# Patient Record
Sex: Female | Born: 1980 | Race: White | Hispanic: No | Marital: Married | Smoking: Never smoker
Health system: Southern US, Community
[De-identification: ages and names within clinical notes are randomized; demographics above are authoritative.]

## PROBLEM LIST (undated history)

## (undated) DIAGNOSIS — K3184 Gastroparesis: Secondary | ICD-10-CM

## (undated) DIAGNOSIS — F419 Anxiety disorder, unspecified: Secondary | ICD-10-CM

## (undated) DIAGNOSIS — R109 Unspecified abdominal pain: Secondary | ICD-10-CM

## (undated) DIAGNOSIS — E119 Type 2 diabetes mellitus without complications: Secondary | ICD-10-CM

## (undated) DIAGNOSIS — G5602 Carpal tunnel syndrome, left upper limb: Secondary | ICD-10-CM

## (undated) DIAGNOSIS — R519 Headache, unspecified: Secondary | ICD-10-CM

## (undated) DIAGNOSIS — E039 Hypothyroidism, unspecified: Secondary | ICD-10-CM

## (undated) DIAGNOSIS — L02419 Cutaneous abscess of limb, unspecified: Secondary | ICD-10-CM

## (undated) DIAGNOSIS — K219 Gastro-esophageal reflux disease without esophagitis: Secondary | ICD-10-CM

## (undated) DIAGNOSIS — F329 Major depressive disorder, single episode, unspecified: Secondary | ICD-10-CM

## (undated) DIAGNOSIS — K8020 Calculus of gallbladder without cholecystitis without obstruction: Secondary | ICD-10-CM

## (undated) DIAGNOSIS — F32A Depression, unspecified: Secondary | ICD-10-CM

## (undated) DIAGNOSIS — G8929 Other chronic pain: Secondary | ICD-10-CM

## (undated) DIAGNOSIS — R51 Headache: Secondary | ICD-10-CM

## (undated) DIAGNOSIS — G4730 Sleep apnea, unspecified: Secondary | ICD-10-CM

## (undated) DIAGNOSIS — L03119 Cellulitis of unspecified part of limb: Secondary | ICD-10-CM

## (undated) DIAGNOSIS — K429 Umbilical hernia without obstruction or gangrene: Secondary | ICD-10-CM

## (undated) DIAGNOSIS — I890 Lymphedema, not elsewhere classified: Secondary | ICD-10-CM

## (undated) HISTORY — DX: Calculus of gallbladder without cholecystitis without obstruction: K80.20

## (undated) HISTORY — DX: Carpal tunnel syndrome, left upper limb: G56.02

## (undated) HISTORY — PX: HERNIA REPAIR: SHX51

## (undated) HISTORY — DX: Hypothyroidism, unspecified: E03.9

## (undated) HISTORY — DX: Anxiety disorder, unspecified: F41.9

---

## 2001-03-14 ENCOUNTER — Emergency Department (HOSPITAL_COMMUNITY): Admission: EM | Admit: 2001-03-14 | Discharge: 2001-03-14 | Payer: Self-pay | Admitting: Emergency Medicine

## 2005-02-25 ENCOUNTER — Ambulatory Visit (HOSPITAL_COMMUNITY): Admission: RE | Admit: 2005-02-25 | Discharge: 2005-02-25 | Payer: Self-pay | Admitting: Obstetrics and Gynecology

## 2010-08-01 ENCOUNTER — Encounter: Payer: Self-pay | Admitting: Obstetrics and Gynecology

## 2011-05-02 ENCOUNTER — Ambulatory Visit: Payer: Self-pay | Admitting: Family Medicine

## 2012-11-26 ENCOUNTER — Encounter (INDEPENDENT_AMBULATORY_CARE_PROVIDER_SITE_OTHER): Payer: Self-pay

## 2012-11-26 ENCOUNTER — Emergency Department (HOSPITAL_COMMUNITY): Payer: Medicaid Other

## 2012-11-26 ENCOUNTER — Encounter (HOSPITAL_COMMUNITY): Payer: Self-pay | Admitting: *Deleted

## 2012-11-26 ENCOUNTER — Emergency Department (HOSPITAL_COMMUNITY)
Admission: EM | Admit: 2012-11-26 | Discharge: 2012-11-26 | Disposition: A | Discharge status: Home / Self Care | Payer: Medicaid Other | Attending: Emergency Medicine | Admitting: Emergency Medicine

## 2012-11-26 DIAGNOSIS — R112 Nausea with vomiting, unspecified: Secondary | ICD-10-CM | POA: Insufficient documentation

## 2012-11-26 DIAGNOSIS — Z3202 Encounter for pregnancy test, result negative: Secondary | ICD-10-CM | POA: Insufficient documentation

## 2012-11-26 DIAGNOSIS — Z8679 Personal history of other diseases of the circulatory system: Secondary | ICD-10-CM | POA: Insufficient documentation

## 2012-11-26 DIAGNOSIS — E119 Type 2 diabetes mellitus without complications: Secondary | ICD-10-CM | POA: Insufficient documentation

## 2012-11-26 DIAGNOSIS — Z91040 Latex allergy status: Secondary | ICD-10-CM | POA: Insufficient documentation

## 2012-11-26 DIAGNOSIS — K429 Umbilical hernia without obstruction or gangrene: Secondary | ICD-10-CM | POA: Insufficient documentation

## 2012-11-26 HISTORY — DX: Lymphedema, not elsewhere classified: I89.0

## 2012-11-26 HISTORY — DX: Type 2 diabetes mellitus without complications: E11.9

## 2012-11-26 LAB — COMPREHENSIVE METABOLIC PANEL
ALT: 30 U/L (ref 0–35)
AST: 27 U/L (ref 0–37)
Albumin: 3.5 g/dL (ref 3.5–5.2)
CO2: 25 mEq/L (ref 19–32)
Creatinine, Ser: 0.79 mg/dL (ref 0.50–1.10)
GFR calc Af Amer: >90 mL/min (ref >90)
GFR calc non Af Amer: >90 mL/min (ref >90)
Glucose, Bld: 389 mg/dL — ABNORMAL HIGH (ref 70–99)
Potassium: 3.8 mEq/L (ref 3.5–5.1)
Total Protein: 7.5 g/dL (ref 6.0–8.3)

## 2012-11-26 LAB — URINALYSIS, ROUTINE W REFLEX MICROSCOPIC
Bilirubin Urine: NEGATIVE
Glucose, UA: >1000 mg/dL — AB
Ketones, ur: NEGATIVE mg/dL
Nitrite: NEGATIVE
Protein, ur: NEGATIVE mg/dL
Urobilinogen, UA: 0.2 mg/dL (ref 0.0–1.0)

## 2012-11-26 LAB — LIPASE, BLOOD: Lipase: 34 U/L (ref 11–59)

## 2012-11-26 LAB — CBC WITH DIFFERENTIAL/PLATELET
Basophils Relative: 0 % (ref 0–1)
Eosinophils Absolute: 0.1 10*3/uL (ref 0.0–0.7)
Hemoglobin: 13.2 g/dL (ref 12.0–15.0)
Lymphocytes Relative: 32 % (ref 12–46)
MCV: 89.2 fL (ref 78.0–100.0)
Monocytes Absolute: 0.3 10*3/uL (ref 0.1–1.0)
Monocytes Relative: 4 % (ref 3–12)
Neutro Abs: 4.3 10*3/uL (ref 1.7–7.7)
Neutrophils Relative %: 62 % (ref 43–77)
Platelets: 202 10*3/uL (ref 150–400)
RDW: 13.8 % (ref 11.5–15.5)

## 2012-11-26 LAB — URINE MICROSCOPIC-ADD ON

## 2012-11-26 LAB — LACTIC ACID, PLASMA: Lactic Acid, Venous: 2.8 mmol/L — ABNORMAL HIGH (ref 0.5–2.2)

## 2012-11-26 MED ORDER — HYDROMORPHONE HCL PF 1 MG/ML IJ SOLN
0.5000 mg | Freq: Once | INTRAMUSCULAR | Status: AC
  Administered 2012-11-26: 0.5 mg via INTRAVENOUS
  Filled 2012-11-26: qty 1

## 2012-11-26 MED ORDER — ONDANSETRON HCL 4 MG PO TABS: 4.0000 mg | ORAL_TABLET | Freq: Four times a day (QID) | ORAL | Status: DC | PRN

## 2012-11-26 MED ORDER — ONDANSETRON HCL 4 MG/2ML IJ SOLN
4.0000 mg | Freq: Once | INTRAMUSCULAR | Status: AC
  Administered 2012-11-26: 4 mg via INTRAVENOUS
  Filled 2012-11-26: qty 2

## 2012-11-26 MED ORDER — SODIUM CHLORIDE 0.9 % IV SOLN
Freq: Once | INTRAVENOUS | Status: AC
  Administered 2012-11-26: 20:00:00 via INTRAVENOUS

## 2012-11-26 MED ORDER — OXYCODONE-ACETAMINOPHEN 5-325 MG PO TABS: 1.0000 | ORAL_TABLET | ORAL | Status: DC | PRN

## 2012-11-26 NOTE — ED Notes (Signed)
abd pain, vomiting , Seen at [facility] recently, says she has umbilical hernia.

## 2012-11-26 NOTE — Consult Note (Signed)
Reason for Consult: Umbilical hernia Referring Physician: ER physician  Brittany [name] is an 32 y.o. female.  HPI: Patient is a morbidly obese white female who has had an umbilical hernia for many years. She states that she has had intermittent bleeding from the umbilicus and that it has been red and irritated for many years. She was recently seen by her primary care physician do to increasing swelling of the umbilicus. She has been referred to [facility] surgical for further management and treatment. She presented emergency room in [facility] today due to worsening pain inthe umbilicus. She states she had an episode of vomiting earlier today, though she has had this multiple times over the past few years. This episode was not any different. No fever or chills have been noted. She has had normal bowel movements.  Past Medical History  Diagnosis Date  . Diabetes mellitus without complication   . Lymphedema     Past Surgical History  Procedure Laterality Date  . Cesarean section      History reviewed. No pertinent family history.  Social History:  reports that she has never smoked. She does not have any smokeless tobacco history on file. She reports that she does not drink alcohol or use illicit drugs.  Allergies:  Allergies  Allergen Reactions  . Latex Itching, Swelling and Rash  . Sulfa Antibiotics Nausea And Vomiting and Rash    Medications: I have reviewed the patient's current medications.  Results for orders placed during the hospital encounter of 11/26/12 (from the past 48 hour(s))  CBC WITH DIFFERENTIAL     Status: None   Collection Time    11/26/12  7:50 PM      Result Value Range   WBC 6.9  4.0 - 10.5 K/uL   RBC 4.53  3.87 - 5.11 MIL/uL   Hemoglobin 13.2  12.0 - 15.0 g/dL   HCT 16.1  09.6 - 04.5 %   MCV 89.2  78.0 - 100.0 fL   MCH 29.1  26.0 - 34.0 pg   MCHC 32.7  30.0 - 36.0 g/dL   RDW 40.9  81.1 - 91.4 %   Platelets 202  150 - 400 K/uL   Neutrophils  Relative % 62  43 - 77 %   Neutro Abs 4.3  1.7 - 7.7 K/uL   Lymphocytes Relative 32  12 - 46 %   Lymphs Abs 2.2  0.7 - 4.0 K/uL   Monocytes Relative 4  3 - 12 %   Monocytes Absolute 0.3  0.1 - 1.0 K/uL   Eosinophils Relative 2  0 - 5 %   Eosinophils Absolute 0.1  0.0 - 0.7 K/uL   Basophils Relative 0  0 - 1 %   Basophils Absolute 0.0  0.0 - 0.1 K/uL  COMPREHENSIVE METABOLIC PANEL     Status: Abnormal   Collection Time    11/26/12  7:50 PM      Result Value Range   Sodium 132 (*) 135 - 145 mEq/L   Potassium 3.8  3.5 - 5.1 mEq/L   Chloride 95 (*) 96 - 112 mEq/L   CO2 25  19 - 32 mEq/L   Glucose, Bld 389 (*) 70 - 99 mg/dL   BUN 10  6 - 23 mg/dL   Creatinine, Ser 7.82  0.50 - 1.10 mg/dL   Calcium 9.3  8.4 - 95.6 mg/dL   Total Protein 7.5  6.0 - 8.3 g/dL   Albumin 3.5  3.5 - 5.2  g/dL   AST 27  0 - 37 U/L   ALT 30  0 - 35 U/L   Alkaline Phosphatase 121 (*) 39 - 117 U/L   Total Bilirubin 0.3  0.3 - 1.2 mg/dL   GFR calc non Af Amer >90  >90 mL/min   GFR calc Af Amer >90  >90 mL/min   Comment:            The eGFR has been calculated     using the CKD EPI equation.     This calculation has not been     validated in all clinical     situations.     eGFR's persistently     <90 mL/min signify     possible Chronic Kidney Disease.  LIPASE, BLOOD     Status: None   Collection Time    11/26/12  7:50 PM      Result Value Range   Lipase 34  11 - 59 U/L  LACTIC ACID, PLASMA     Status: Abnormal   Collection Time    11/26/12  8:17 PM      Result Value Range   Lactic Acid, Venous 2.8 (*) 0.5 - 2.2 mmol/L  URINALYSIS, ROUTINE W REFLEX MICROSCOPIC     Status: Abnormal   Collection Time    11/26/12  8:20 PM      Result Value Range   Color, Urine YELLOW  YELLOW   APPearance CLEAR  CLEAR   Specific Gravity, Urine >1.030 (*) 1.005 - 1.030   pH 5.5  5.0 - 8.0   Glucose, UA >1000 (*) NEGATIVE mg/dL   Hgb urine dipstick NEGATIVE  NEGATIVE   Bilirubin Urine NEGATIVE  NEGATIVE   Ketones,  ur NEGATIVE  NEGATIVE mg/dL   Protein, ur NEGATIVE  NEGATIVE mg/dL   Urobilinogen, UA 0.2  0.0 - 1.0 mg/dL   Nitrite NEGATIVE  NEGATIVE   Leukocytes, UA NEGATIVE  NEGATIVE  URINE MICROSCOPIC-ADD ON     Status: Abnormal   Collection Time    11/26/12  8:20 PM      Result Value Range   Squamous Epithelial / LPF FEW (*) RARE   WBC, UA 0-2  <3 WBC/hpf   Bacteria, UA RARE  RARE  POCT PREGNANCY, URINE     Status: None   Collection Time    11/26/12  8:27 PM      Result Value Range   Preg Test, Ur NEGATIVE  NEGATIVE   Comment:            THE SENSITIVITY OF THIS     METHODOLOGY IS >24 mIU/mL    Dg Abd Acute W/chest  11/26/2012   *RADIOLOGY REPORT*  Clinical Data: Abdominal pain, nausea and vomiting.  ACUTE ABDOMEN SERIES (ABDOMEN 2 VIEW & CHEST 1 VIEW)  Comparison: 11/18/2012.  Findings: The upright chest x-ray is unremarkable and stable.  Two views of the abdomen are limited by body habitus.  The bowel gas pattern is grossly normal.  No findings to suggest a small bowel obstruction or free air.  The soft tissue shadows are grossly maintained.  The bony structures are intact.  An IUD is noted in the pelvis.  IMPRESSION:  1.  No acute cardiopulmonary findings. 2.  Limited abdominal examination due to body habitus but no gross acute abdominal findings.   Original Report Authenticated By: Rudie Meyer, M.D.    ROS: See chart Blood pressure 138/94, pulse 106, temperature 97.6 F (36.4 C), temperature source Oral, resp.  rate 20, height 5\' 11"  (1.803 m), weight 255.375 kg (563 lb), SpO2 100.00%. Physical Exam: Morbidly obese white female in no acute distress. She is lying comfortably in the bed. Lungs clear to auscultation with equal breath sounds bilaterally. Heart examination reveals a regular rate and rhythm without S3, S4, murmurs. Abdomen is soft, nontender, nondistended. No rigidity noted. She does have an easily reducible umbilical hernia with the skin overlying the umbilicus with scarring and  redness noted. No purulent drainage is noted. No bleeding is noted.  Assessment/Plan: Impression: Umbilical hernia, chronic, not incarcerated or strangulated. Plan: No need for urgent surgical intervention. The patient wishes to keep her appointment with [facility] surgical next week. She has been on antibiotics and pass. I do not think she needs more dosing at this time. Neosporin ointment to the umbilicus as needed. This was discussed with Dr. Preston Fleeting.  Aviya Jarvie A 11/26/2012, 9:22 PM

## 2012-11-26 NOTE — ED Provider Notes (Signed)
History     CSN: [id number]  Arrival date & time 11/26/12  1907   First MD Initiated Contact with Patient 11/26/12 1930      Chief Complaint  Patient presents with  . Abdominal Pain    (Consider location/radiation/quality/duration/timing/severity/associated sxs/prior treatment) Patient is a 32 y.o. female presenting with abdominal pain. The history is provided by the patient.  Abdominal Pain She had onset one week ago of severe periumbilical pain with nausea and vomiting. She went to the emergency department at [facility] where workup showed that she had a local hernia. They were not able to do CT scan because of her body habitus and she was referred back to her PCP. She saw her PCP who has referred her to a Careers adviser in [place]. However, she continues to have pain and nausea which is worse after eating. She also has been having frequent vomiting. There's been no fever, chills, sweats. Pain is rated at 10/10. It is worse with palpation but nothing makes it better.  Past Medical History  Diagnosis Date  . Diabetes mellitus without complication   . Lymphedema     Past Surgical History  Procedure Laterality Date  . Cesarean section      History reviewed. No pertinent family history.  History  Substance Use Topics  . Smoking status: Never Smoker   . Smokeless tobacco: Not on file  . Alcohol Use: No    OB History   Grav Para Term Preterm Abortions TAB SAB Ect Mult Living                  Review of Systems  Gastrointestinal: Positive for abdominal pain.  All other systems reviewed and are negative.    Allergies  Latex and Sulfa antibiotics  Home Medications   Current Outpatient Rx  Name  Route  Sig  Dispense  Refill  . HYDROcodone-acetaminophen (NORCO/VICODIN) 5-325 MG per tablet   Oral   Take 1 tablet by mouth every 6 (six) hours as needed for pain.           BP 138/94  Pulse 106  Temp(Src) 97.6 F (36.4 C) (Oral)  Resp 20  Ht 5\' 11"  (1.803 m)   Wt 563 lb (255.375 kg)  BMI 78.56 kg/m2  SpO2 100%  Physical Exam  Nursing note and vitals reviewed.  Morbidly obese 32 year old female, resting comfortably and in no acute distress. Vital signs are significant for tachycardia very 106, and hypertension with blood pressure 138/94. Oxygen saturation is 100%, which is normal. Head is normocephalic and atraumatic. PERRLA, EOMI. Oropharynx is clear. Neck is nontender and supple without adenopathy or JVD. Back is nontender and there is no CVA tenderness. Lungs are clear without rales, wheezes, or rhonchi. Chest is nontender. Heart has regular rate and rhythm without murmur. Abdomen is soft, flat, with approximately 4 cm diameter umbilical hernia present. The hernia is very tender but is not indurated. It is able to be completely reduced, although it does re-form. There is no rebound or guarding. There are no masses or hepatosplenomegaly and peristalsis is hypoactive. Extremities have no cyanosis or edema, full range of motion is present. Skin is warm and dry without rash. Neurologic: Mental status is normal, cranial nerves are intact, there are no motor or sensory deficits.  ED Course  Procedures (including critical care time)  Results for orders placed during the hospital encounter of 11/26/12  CBC WITH DIFFERENTIAL      Result Value Range   WBC  6.9  4.0 - 10.5 K/uL   RBC 4.53  3.87 - 5.11 MIL/uL   Hemoglobin 13.2  12.0 - 15.0 g/dL   HCT 16.1  09.6 - 04.5 %   MCV 89.2  78.0 - 100.0 fL   MCH 29.1  26.0 - 34.0 pg   MCHC 32.7  30.0 - 36.0 g/dL   RDW 40.9  81.1 - 91.4 %   Platelets 202  150 - 400 K/uL   Neutrophils Relative % 62  43 - 77 %   Neutro Abs 4.3  1.7 - 7.7 K/uL   Lymphocytes Relative 32  12 - 46 %   Lymphs Abs 2.2  0.7 - 4.0 K/uL   Monocytes Relative 4  3 - 12 %   Monocytes Absolute 0.3  0.1 - 1.0 K/uL   Eosinophils Relative 2  0 - 5 %   Eosinophils Absolute 0.1  0.0 - 0.7 K/uL   Basophils Relative 0  0 - 1 %   Basophils  Absolute 0.0  0.0 - 0.1 K/uL  COMPREHENSIVE METABOLIC PANEL      Result Value Range   Sodium 132 (*) 135 - 145 mEq/L   Potassium 3.8  3.5 - 5.1 mEq/L   Chloride 95 (*) 96 - 112 mEq/L   CO2 25  19 - 32 mEq/L   Glucose, Bld 389 (*) 70 - 99 mg/dL   BUN 10  6 - 23 mg/dL   Creatinine, Ser 7.82  0.50 - 1.10 mg/dL   Calcium 9.3  8.4 - 95.6 mg/dL   Total Protein 7.5  6.0 - 8.3 g/dL   Albumin 3.5  3.5 - 5.2 g/dL   AST 27  0 - 37 U/L   ALT 30  0 - 35 U/L   Alkaline Phosphatase 121 (*) 39 - 117 U/L   Total Bilirubin 0.3  0.3 - 1.2 mg/dL   GFR calc non Af Amer >90  >90 mL/min   GFR calc Af Amer >90  >90 mL/min  LIPASE, BLOOD      Result Value Range   Lipase 34  11 - 59 U/L  URINALYSIS, ROUTINE W REFLEX MICROSCOPIC      Result Value Range   Color, Urine YELLOW  YELLOW   APPearance CLEAR  CLEAR   Specific Gravity, Urine >1.030 (*) 1.005 - 1.030   pH 5.5  5.0 - 8.0   Glucose, UA >1000 (*) NEGATIVE mg/dL   Hgb urine dipstick NEGATIVE  NEGATIVE   Bilirubin Urine NEGATIVE  NEGATIVE   Ketones, ur NEGATIVE  NEGATIVE mg/dL   Protein, ur NEGATIVE  NEGATIVE mg/dL   Urobilinogen, UA 0.2  0.0 - 1.0 mg/dL   Nitrite NEGATIVE  NEGATIVE   Leukocytes, UA NEGATIVE  NEGATIVE  LACTIC ACID, PLASMA      Result Value Range   Lactic Acid, Venous 2.8 (*) 0.5 - 2.2 mmol/L  URINE MICROSCOPIC-ADD ON      Result Value Range   Squamous Epithelial / LPF FEW (*) RARE   WBC, UA 0-2  <3 WBC/hpf   Bacteria, UA RARE  RARE  POCT PREGNANCY, URINE      Result Value Range   Preg Test, Ur NEGATIVE  NEGATIVE   Dg Abd Acute W/chest  11/26/2012   *RADIOLOGY REPORT*  Clinical Data: Abdominal pain, nausea and vomiting.  ACUTE ABDOMEN SERIES (ABDOMEN 2 VIEW & CHEST 1 VIEW)  Comparison: 11/18/2012.  Findings: The upright chest x-ray is unremarkable and stable.  Two views of the abdomen  are limited by body habitus.  The bowel gas pattern is grossly normal.  No findings to suggest a small bowel obstruction or free air.  The soft  tissue shadows are grossly maintained.  The bony structures are intact.  An IUD is noted in the pelvis.  IMPRESSION:  1.  No acute cardiopulmonary findings. 2.  Limited abdominal examination due to body habitus but no gross acute abdominal findings.   Original Report Authenticated By: Rudie Meyer, M.D.    Images viewed by me.   1. Umbilical hernia   2. Morbid obesity       MDM  Umbilical hernia without evidence of incarceration. Statistically, the hernia is most likely to contain omentum and not bowel. Because of her weight of well over 500 pounds, she cannot be accommodated by our CT scanner. Plain x-rays will be obtained to look for evidence of obstruction and lactic acid level will be checked which is also looking for evidence of incarceration. She will be given hydromorphone for pain and ondansetron for nausea. She states that she did not like the way that morphine made her feel, so she will be started on a low dose of hydromorphone and titrated up until she gets adequate pain relief.  She got good relief of pain with hydromorphone 0.5 mg. Workup is significant for a mildly elevated lactic acid level. WBC is normal and she does not have clinical signs of an acute abdomen. Surgical consultation will be obtained. I have contacted Dr. Lovell Sheehan who agrees to see the patient in the ED.  Dr. Lovell Sheehan consult is appreciated. He also believes that the patient is safe to go home. There is no evidence of incarcerated hernia or strangulated bowel. She is discharged with prescriptions for Percocet and ondansetron and is to see her surgeon in [place] as scheduled.      Dione Booze, MD 11/26/12 2135

## 2012-12-06 ENCOUNTER — Encounter (INDEPENDENT_AMBULATORY_CARE_PROVIDER_SITE_OTHER): Payer: Self-pay | Admitting: General Surgery

## 2012-12-06 ENCOUNTER — Ambulatory Visit (INDEPENDENT_AMBULATORY_CARE_PROVIDER_SITE_OTHER): Payer: Medicaid Other | Admitting: General Surgery

## 2012-12-06 VITALS — BP 108/82 | HR 87 | Temp 97.3°F | Ht 71.0 in | Wt >= 6400 oz

## 2012-12-06 DIAGNOSIS — K429 Umbilical hernia without obstruction or gangrene: Secondary | ICD-10-CM

## 2012-12-06 NOTE — Progress Notes (Signed)
Patient ID: Brittany [name], female   DOB: Mar 20, 1981, 32 y.o.   MRN: [id number]  Chief Complaint  Patient presents with  . New Evaluation    eval umb hernia    HPI Brittany [name] is a 32 y.o. female.  This patient is referred by Dr. Wilford Sports for evaluation of an umbilical hernia. She has known about this for the last 3-4 years and has been increasing in size until about the last year which he says it has been stable in size. However, recently it is becoming more uncomfortable and she actually had a some nausea and some bleeding from around the umbilicus and she presented to [facility].  She again presented to the emergency room this time at [facility] where she was seen in the emergency room againwith periumbilical pain. She was evaluated by the surgeon to reduce the hernia but she said the hernia came right back. She says that she's had blood from around the umbilicus about 3 times as well as some blood in in her stool and some vomiting. She's describes this as "sore" but essentially her symptoms have been stable. She says that the nausea and vomiting is independent of her hernias and she has had this for some time as well. She has not had any CT scan because of her weight and the inability to fit on the scanner.  She is diabetic and she says that her sugars run at 200-230 range  HPI  Past Medical History  Diagnosis Date  . Diabetes mellitus without complication   . Lymphedema     Past Surgical History  Procedure Laterality Date  . Cesarean section  11/09/01    Family History  Problem Relation Age of Onset  . Cancer Mother     leukemia    Social History History  Substance Use Topics  . Smoking status: Never Smoker   . Smokeless tobacco: Not on file  . Alcohol Use: No    Allergies  Allergen Reactions  . Latex Itching, Swelling and Rash  . Sulfa Antibiotics Nausea And Vomiting and Rash    Current Outpatient Prescriptions  Medication Sig Dispense Refill  .  metFORMIN (GLUCOPHAGE) 500 MG tablet Take 500 mg by mouth daily.      . ondansetron (ZOFRAN) 4 MG tablet Take 1 tablet (4 mg total) by mouth every 6 (six) hours as needed for nausea.  20 tablet  0   No current facility-administered medications for this visit.    Review of Systems Review of Systems All other review of systems negative or noncontributory except as stated in the HPI  Blood pressure 108/82, pulse 87, temperature 97.3 F (36.3 C), temperature source Temporal, height 5\' 11"  (1.803 m), weight 565 lb 3.2 oz (256.373 kg), SpO2 98.00%.  Physical Exam Physical Exam Physical Exam  Nursing note and vitals reviewed. Constitutional: She is oriented to person, place, and time. She appears well-developed and well-nourished. No distress.  HENT:  Head: Normocephalic and atraumatic.  Mouth/Throat: No oropharyngeal exudate.  Eyes: Conjunctivae and EOM are normal. Pupils are equal, round, and reactive to light. Right eye exhibits no discharge. Left eye exhibits no discharge. No scleral icterus.  Neck: Normal range of motion. Neck supple. No tracheal deviation present.  Cardiovascular: Normal rate, regular rhythm, normal heart sounds and intact distal pulses.   Pulmonary/Chest: Effort normal and breath sounds normal. No stridor. No respiratory distress. She has no wheezes.  Abdominal: Soft. Bowel sounds are normal. She exhibits no distension and  no mass. There is no tenderness. There is no rebound and no guarding. She has a moderate sized umbilical hernia with some slight thickening of the skin and excoriation of the periumbilical skin where the hernia is chaffing on the adjacent skin.  No bleeding and no cellulitis.  Minimal tenderness.  Musculoskeletal: Normal range of motion. She has significant edema and LE swelling and no tenderness.  Neurological: She is alert and oriented to person, place, and time.  Skin: Skin is warm and dry. periumbilical rash noted. She is not diaphoretic. No  erythema. No pallor.  Psychiatric: She has a normal mood and affect. Her behavior is normal. Judgment and thought content normal.    Data Reviewed ER records and labs  Assessment    Umbilical hernia She does have a moderate-sized umbilical hernia. She was like to have this repaired because she is apparently symptomatic from this. It has been enlarging in size and I think that she would benefit from repair. Generally this would be amenable to open repair but given her weight and the excoriation of the skin, I think that laparoscopic repair might be more beneficial for her. I think that she would be low risk of wound infection moving the incisions away from the actual hernia.  Regardless, given her diabetes and her weight, I explained that she is high risk for complications. I would recommend laparoscopic umbilical hernia repair with mesh. We discussed the procedure and its risks including the risks of infection, bleeding, pain, scarring, recurrence, persistent symptoms, seroma and persistent umbilical bulge and poor cosmesis, bowel injury and the need for open surgery and she expressed understanding of them would like to proceed with laparoscopic umbilical hernia repair with mesh. I think that she would benefit from tighter glucose control in the meantime however, given her symptoms and the skin excoriation, not sure that we have time to fully adjust her diabetes regimen prior to surgery. I will plan for overnight observation and glucose control and pain control after her procedure     Plan    We will plan for laparoscopic umbilical hernia repair with mesh when convenient for her        Lodema Pilot DAVID 12/06/2012, 2:53 PM

## 2012-12-10 MED FILL — Oxycodone w/ Acetaminophen Tab 5-325 MG: ORAL | Qty: 6 | Status: AC

## 2012-12-13 ENCOUNTER — Telehealth (INDEPENDENT_AMBULATORY_CARE_PROVIDER_SITE_OTHER): Payer: Self-pay

## 2012-12-13 NOTE — Telephone Encounter (Signed)
Pts spouse called stating pt having severe abd pain with vomiting. He want to know if he should take pt to ER. I advised him pt needs to be evaluated By ER MD to rule out obstruction or incarcerated hernia. He states he understands and will take pt  to [facility] now.

## 2012-12-21 ENCOUNTER — Encounter (HOSPITAL_COMMUNITY): Payer: Self-pay | Admitting: Pharmacy Technician

## 2012-12-28 ENCOUNTER — Encounter (HOSPITAL_COMMUNITY)
Admission: RE | Admit: 2012-12-28 | Discharge: 2012-12-28 | Disposition: A | Discharge status: Home / Self Care | Payer: Medicaid Other | Source: Ambulatory Visit | Attending: General Surgery | Admitting: General Surgery

## 2012-12-28 ENCOUNTER — Other Ambulatory Visit (HOSPITAL_COMMUNITY): Payer: Self-pay | Admitting: *Deleted

## 2012-12-28 ENCOUNTER — Encounter (HOSPITAL_COMMUNITY): Payer: Self-pay

## 2012-12-28 VITALS — BP 135/86 | HR 84 | Temp 97.7°F | Resp 16 | Ht 71.0 in | Wt >= 6400 oz

## 2012-12-28 DIAGNOSIS — K429 Umbilical hernia without obstruction or gangrene: Secondary | ICD-10-CM

## 2012-12-28 HISTORY — DX: Umbilical hernia without obstruction or gangrene: K42.9

## 2012-12-28 HISTORY — DX: Sleep apnea, unspecified: G47.30

## 2012-12-28 LAB — SURGICAL PCR SCREEN
MRSA, PCR: NEGATIVE
Staphylococcus aureus: POSITIVE — AB

## 2012-12-28 LAB — BASIC METABOLIC PANEL
Calcium: 9.1 mg/dL (ref 8.4–10.5)
Creatinine, Ser: 0.63 mg/dL (ref 0.50–1.10)
GFR calc non Af Amer: >90 mL/min (ref >90)
Glucose, Bld: 346 mg/dL — ABNORMAL HIGH (ref 70–99)
Sodium: 135 mEq/L (ref 135–145)

## 2012-12-28 LAB — CBC WITH DIFFERENTIAL/PLATELET
Basophils Absolute: 0 10*3/uL (ref 0.0–0.1)
Basophils Relative: 0 % (ref 0–1)
Eosinophils Absolute: 0.1 10*3/uL (ref 0.0–0.7)
Lymphs Abs: 1.4 10*3/uL (ref 0.7–4.0)
MCH: 28.8 pg (ref 26.0–34.0)
Neutrophils Relative %: 64 % (ref 43–77)
Platelets: 183 10*3/uL (ref 150–400)
RBC: 4.55 MIL/uL (ref 3.87–5.11)
RDW: 13.9 % (ref 11.5–15.5)

## 2012-12-28 LAB — HCG, SERUM, QUALITATIVE: Preg, Serum: NEGATIVE

## 2012-12-28 NOTE — Progress Notes (Signed)
BMET faxed to Dr. Biagio Quint

## 2012-12-28 NOTE — Patient Instructions (Addendum)
GRIZEL VESELY  12/28/2012                           YOUR PROCEDURE IS SCHEDULED ON:  01/02/13               PLEASE REPORT TO SHORT STAY CENTER AT :  9:30 AM               CALL THIS NUMBER IF ANY PROBLEMS THE DAY OF SURGERY :               [phone]                      REMEMBER:   Do not eat food or drink liquids AFTER MIDNIGHT   Take these medicines the morning of surgery with A SIP OF WATER:  ZOFRAN IF NEEDED   Do not wear jewelry, make-up   Do not wear lotions, powders, or perfumes.   Do not shave legs or underarms 12 hrs. before surgery (men may shave face)  Do not bring valuables to the hospital.  Contacts, dentures or bridgework may not be worn into surgery.  Leave suitcase in the car. After surgery it may be brought to your room.  For patients admitted to the hospital more than one night, checkout time is 11:00                          The day of discharge.   Patients discharged the day of surgery will not be allowed to drive home                             If going home same day of surgery, must have someone stay with you first                           24 hrs at home and arrange for some one to drive you home from hospital.    Special Instructions:   Please read over the following fact sheets that you were given:               1. MRSA  INFORMATION                      2. [facility] PREPARING FOR SURGERY SHEET                                                X_____________________________________________________________________        Failure to follow these instructions may result in cancellation of your surgery

## 2013-01-02 ENCOUNTER — Encounter (HOSPITAL_COMMUNITY): Payer: Self-pay | Admitting: Certified Registered Nurse Anesthetist

## 2013-01-02 ENCOUNTER — Inpatient Hospital Stay (HOSPITAL_COMMUNITY)
Admission: RE | Admit: 2013-01-02 | Discharge: 2013-01-05 | DRG: 354 | Disposition: A | Discharge status: Home / Self Care | Payer: Medicaid Other | Source: Ambulatory Visit | Attending: General Surgery | Admitting: General Surgery

## 2013-01-02 ENCOUNTER — Encounter (HOSPITAL_COMMUNITY)
Admission: RE | Disposition: A | Discharge status: Home / Self Care | Payer: Self-pay | Source: Ambulatory Visit | Attending: General Surgery

## 2013-01-02 ENCOUNTER — Encounter (HOSPITAL_COMMUNITY): Payer: Self-pay | Admitting: *Deleted

## 2013-01-02 ENCOUNTER — Ambulatory Visit (HOSPITAL_COMMUNITY): Payer: Medicaid Other | Admitting: Certified Registered Nurse Anesthetist

## 2013-01-02 DIAGNOSIS — E119 Type 2 diabetes mellitus without complications: Secondary | ICD-10-CM

## 2013-01-02 DIAGNOSIS — K439 Ventral hernia without obstruction or gangrene: Secondary | ICD-10-CM

## 2013-01-02 DIAGNOSIS — IMO0001 Reserved for inherently not codable concepts without codable children: Secondary | ICD-10-CM

## 2013-01-02 DIAGNOSIS — K429 Umbilical hernia without obstruction or gangrene: Principal | ICD-10-CM

## 2013-01-02 DIAGNOSIS — G4733 Obstructive sleep apnea (adult) (pediatric): Secondary | ICD-10-CM | POA: Diagnosis present

## 2013-01-02 DIAGNOSIS — Z6841 Body Mass Index (BMI) 40.0 and over, adult: Secondary | ICD-10-CM

## 2013-01-02 HISTORY — PX: UMBILICAL HERNIA REPAIR: SHX196

## 2013-01-02 HISTORY — PX: INSERTION OF MESH: SHX5868

## 2013-01-02 LAB — GLUCOSE, CAPILLARY
Glucose-Capillary: 165 mg/dL — ABNORMAL HIGH (ref 70–99)
Glucose-Capillary: 234 mg/dL — ABNORMAL HIGH (ref 70–99)

## 2013-01-02 SURGERY — REPAIR, HERNIA, UMBILICAL, LAPAROSCOPIC
Anesthesia: General | Site: Abdomen | Wound class: Clean

## 2013-01-02 MED ORDER — ESMOLOL HCL 10 MG/ML IV SOLN
INTRAVENOUS | Status: DC | PRN
  Administered 2013-01-02 (×2): 20 mg via INTRAVENOUS
  Administered 2013-01-02: 10 mg via INTRAVENOUS

## 2013-01-02 MED ORDER — HYDROMORPHONE HCL PF 1 MG/ML IJ SOLN: 0.2500 mg | INTRAMUSCULAR | Status: DC | PRN

## 2013-01-02 MED ORDER — INSULIN ASPART 100 UNIT/ML ~~LOC~~ SOLN
0.0000 [IU] | SUBCUTANEOUS | Status: DC
  Administered 2013-01-02: 8 [IU] via SUBCUTANEOUS
  Administered 2013-01-02: 3 [IU] via SUBCUTANEOUS
  Administered 2013-01-02 (×2): 5 [IU] via SUBCUTANEOUS
  Administered 2013-01-03: 3 [IU] via SUBCUTANEOUS
  Administered 2013-01-03 (×3): 5 [IU] via SUBCUTANEOUS
  Administered 2013-01-03 – 2013-01-04 (×3): 3 [IU] via SUBCUTANEOUS
  Administered 2013-01-04: 5 [IU] via SUBCUTANEOUS
  Administered 2013-01-04 (×2): 3 [IU] via SUBCUTANEOUS
  Administered 2013-01-05: 2 [IU] via SUBCUTANEOUS
  Administered 2013-01-05 (×3): 3 [IU] via SUBCUTANEOUS
  Filled 2013-01-02: qty 1

## 2013-01-02 MED ORDER — MORPHINE SULFATE 2 MG/ML IJ SOLN
1.0000 mg | INTRAMUSCULAR | Status: DC | PRN
  Administered 2013-01-02 – 2013-01-03 (×4): 2 mg via INTRAVENOUS
  Administered 2013-01-03: 4 mg via INTRAVENOUS
  Administered 2013-01-04: 2 mg via INTRAVENOUS
  Filled 2013-01-02 (×2): qty 1
  Filled 2013-01-02: qty 2
  Filled 2013-01-02 (×3): qty 1

## 2013-01-02 MED ORDER — PROPOFOL 10 MG/ML IV BOLUS
INTRAVENOUS | Status: DC | PRN
  Administered 2013-01-02: 300 mg via INTRAVENOUS

## 2013-01-02 MED ORDER — LIDOCAINE-EPINEPHRINE 1 %-1:100000 IJ SOLN
INTRAMUSCULAR | Status: AC
  Filled 2013-01-02: qty 1

## 2013-01-02 MED ORDER — CEFAZOLIN SODIUM-DEXTROSE 2-3 GM-% IV SOLR
INTRAVENOUS | Status: AC
  Filled 2013-01-02: qty 50

## 2013-01-02 MED ORDER — CEFAZOLIN SODIUM 1-5 GM-% IV SOLN
INTRAVENOUS | Status: AC
  Filled 2013-01-02: qty 50

## 2013-01-02 MED ORDER — POTASSIUM CHLORIDE IN NACL 20-0.9 MEQ/L-% IV SOLN
INTRAVENOUS | Status: DC
  Administered 2013-01-02 – 2013-01-04 (×6): via INTRAVENOUS
  Filled 2013-01-02 (×8): qty 1000

## 2013-01-02 MED ORDER — ONDANSETRON HCL 4 MG PO TABS: 4.0000 mg | ORAL_TABLET | Freq: Four times a day (QID) | ORAL | Status: DC | PRN

## 2013-01-02 MED ORDER — BUPIVACAINE-EPINEPHRINE 0.25% -1:200000 IJ SOLN
INTRAMUSCULAR | Status: DC | PRN
  Administered 2013-01-02: 16 mL

## 2013-01-02 MED ORDER — PROMETHAZINE HCL 25 MG/ML IJ SOLN
INTRAMUSCULAR | Status: AC
  Filled 2013-01-02: qty 1

## 2013-01-02 MED ORDER — KETAMINE HCL 10 MG/ML IJ SOLN
INTRAMUSCULAR | Status: DC | PRN
  Administered 2013-01-02: 20 mg via INTRAVENOUS

## 2013-01-02 MED ORDER — METFORMIN HCL 500 MG PO TABS
500.0000 mg | ORAL_TABLET | Freq: Every day | ORAL | Status: DC
  Administered 2013-01-03 – 2013-01-04 (×2): 500 mg via ORAL
  Filled 2013-01-02 (×3): qty 1

## 2013-01-02 MED ORDER — OXYCODONE-ACETAMINOPHEN 5-325 MG PO TABS
1.0000 | ORAL_TABLET | ORAL | Status: DC | PRN
  Administered 2013-01-04 (×2): 1 via ORAL
  Administered 2013-01-04 – 2013-01-05 (×2): 2 via ORAL
  Administered 2013-01-05: 1 via ORAL
  Administered 2013-01-05: 2 via ORAL
  Filled 2013-01-02: qty 2
  Filled 2013-01-02: qty 1
  Filled 2013-01-02: qty 2
  Filled 2013-01-02: qty 1
  Filled 2013-01-02 (×2): qty 2

## 2013-01-02 MED ORDER — ONDANSETRON HCL 4 MG/2ML IJ SOLN
4.0000 mg | Freq: Four times a day (QID) | INTRAMUSCULAR | Status: DC | PRN
  Administered 2013-01-03 – 2013-01-05 (×4): 4 mg via INTRAVENOUS
  Filled 2013-01-02 (×5): qty 2

## 2013-01-02 MED ORDER — MIDAZOLAM HCL 5 MG/5ML IJ SOLN
INTRAMUSCULAR | Status: DC | PRN
  Administered 2013-01-02: 2 mg via INTRAVENOUS

## 2013-01-02 MED ORDER — BUPIVACAINE HCL (PF) 0.25 % IJ SOLN
INTRAMUSCULAR | Status: AC
  Filled 2013-01-02: qty 30

## 2013-01-02 MED ORDER — HEPARIN SODIUM (PORCINE) 5000 UNIT/ML IJ SOLN
5000.0000 [IU] | Freq: Three times a day (TID) | INTRAMUSCULAR | Status: DC
  Administered 2013-01-03 – 2013-01-04 (×5): 5000 [IU] via SUBCUTANEOUS
  Filled 2013-01-02 (×7): qty 1

## 2013-01-02 MED ORDER — HYDROMORPHONE HCL PF 1 MG/ML IJ SOLN
INTRAMUSCULAR | Status: DC | PRN
  Administered 2013-01-02 (×4): 0.5 mg via INTRAVENOUS

## 2013-01-02 MED ORDER — CEFAZOLIN SODIUM 10 G IJ SOLR
3.0000 g | INTRAMUSCULAR | Status: AC
  Administered 2013-01-02: 3 g via INTRAVENOUS
  Filled 2013-01-02: qty 3000

## 2013-01-02 MED ORDER — NEOSTIGMINE METHYLSULFATE 1 MG/ML IJ SOLN
INTRAMUSCULAR | Status: DC | PRN
  Administered 2013-01-02: 5 mg via INTRAVENOUS

## 2013-01-02 MED ORDER — FENTANYL CITRATE 0.05 MG/ML IJ SOLN
INTRAMUSCULAR | Status: DC | PRN
  Administered 2013-01-02 (×2): 100 ug via INTRAVENOUS
  Administered 2013-01-02 (×3): 50 ug via INTRAVENOUS

## 2013-01-02 MED ORDER — PROMETHAZINE HCL 25 MG/ML IJ SOLN
6.2500 mg | INTRAMUSCULAR | Status: DC | PRN
  Administered 2013-01-02: 6.25 mg via INTRAVENOUS

## 2013-01-02 MED ORDER — HEPARIN SODIUM (PORCINE) 5000 UNIT/ML IJ SOLN
5000.0000 [IU] | Freq: Once | INTRAMUSCULAR | Status: AC
  Administered 2013-01-02: 5000 [IU] via SUBCUTANEOUS
  Filled 2013-01-02: qty 1

## 2013-01-02 MED ORDER — LIDOCAINE-EPINEPHRINE 1 %-1:100000 IJ SOLN
INTRAMUSCULAR | Status: DC | PRN
  Administered 2013-01-02: 16 mL

## 2013-01-02 MED ORDER — INSULIN ASPART 100 UNIT/ML ~~LOC~~ SOLN: 0.0000 [IU] | SUBCUTANEOUS | Status: DC

## 2013-01-02 MED ORDER — LABETALOL HCL 5 MG/ML IV SOLN
INTRAVENOUS | Status: DC | PRN
  Administered 2013-01-02 (×4): 5 mg via INTRAVENOUS

## 2013-01-02 MED ORDER — ROCURONIUM BROMIDE 100 MG/10ML IV SOLN
INTRAVENOUS | Status: DC | PRN
  Administered 2013-01-02: 50 mg via INTRAVENOUS
  Administered 2013-01-02: 20 mg via INTRAVENOUS
  Administered 2013-01-02: 10 mg via INTRAVENOUS

## 2013-01-02 MED ORDER — GLYCOPYRROLATE 0.2 MG/ML IJ SOLN
INTRAMUSCULAR | Status: DC | PRN
  Administered 2013-01-02: 0.6 mg via INTRAVENOUS

## 2013-01-02 MED ORDER — LACTATED RINGERS IV SOLN
INTRAVENOUS | Status: DC
  Administered 2013-01-02: 16:00:00 via INTRAVENOUS

## 2013-01-02 MED ORDER — LACTATED RINGERS IV SOLN
INTRAVENOUS | Status: DC
  Administered 2013-01-02: 1000 mL via INTRAVENOUS

## 2013-01-02 MED ORDER — LIDOCAINE HCL (CARDIAC) 20 MG/ML IV SOLN
INTRAVENOUS | Status: DC | PRN
  Administered 2013-01-02: 100 mg via INTRAVENOUS

## 2013-01-02 MED ORDER — SUCCINYLCHOLINE CHLORIDE 20 MG/ML IJ SOLN
INTRAMUSCULAR | Status: DC | PRN
  Administered 2013-01-02: 160 mg via INTRAVENOUS

## 2013-01-02 MED ORDER — ONDANSETRON HCL 4 MG/2ML IJ SOLN
INTRAMUSCULAR | Status: DC | PRN
  Administered 2013-01-02: 4 mg via INTRAVENOUS

## 2013-01-02 SURGICAL SUPPLY — 49 items
ADH SKN CLS APL DERMABOND .7 (GAUZE/BANDAGES/DRESSINGS) ×2
BLADE SURG SZ11 CARB STEEL (BLADE) ×3 IMPLANT
CABLE HIGH FREQUENCY MONO STRZ (ELECTRODE) ×2 IMPLANT
CANISTER SUCTION 2500CC (MISCELLANEOUS) ×3 IMPLANT
CATH FOLEY LATEX FREE 16FR (CATHETERS) ×1 IMPLANT
CLOTH BEACON ORANGE TIMEOUT ST (SAFETY) ×3 IMPLANT
COTTON STERILE ROLL (GAUZE/BANDAGES/DRESSINGS) ×1 IMPLANT
DECANTER SPIKE VIAL GLASS SM (MISCELLANEOUS) ×3 IMPLANT
DERMABOND ADVANCED (GAUZE/BANDAGES/DRESSINGS) ×1
DERMABOND ADVANCED .7 DNX12 (GAUZE/BANDAGES/DRESSINGS) ×2 IMPLANT
DEVICE TROCAR PUNCTURE CLOSURE (ENDOMECHANICALS) ×1 IMPLANT
DRAPE INCISE IOBAN 66X45 STRL (DRAPES) ×4 IMPLANT
DRAPE INCISE IOBAN 85X60 (DRAPES) ×1 IMPLANT
DRAPE LAPAROSCOPIC ABDOMINAL (DRAPES) ×3 IMPLANT
DRSG TEGADERM 4X4.75 (GAUZE/BANDAGES/DRESSINGS) ×1 IMPLANT
ELECT REM PT RETURN 9FT ADLT (ELECTROSURGICAL) ×3
ELECTRODE REM PT RTRN 9FT ADLT (ELECTROSURGICAL) ×2 IMPLANT
GLOVE BIO SURGEON STRL SZ7 (GLOVE) ×4 IMPLANT
GLOVE BIOGEL PI IND STRL 7.0 (GLOVE) ×2 IMPLANT
GLOVE BIOGEL PI INDICATOR 7.0 (GLOVE)
GLOVE ECLIPSE 8.0 STRL XLNG CF (GLOVE) ×2 IMPLANT
GLOVE SURG SS PI 7.5 STRL IVOR (GLOVE) ×9 IMPLANT
GOWN PREVENTION PLUS LG XLONG (DISPOSABLE) ×6 IMPLANT
GOWN STRL NON-REIN LRG LVL3 (GOWN DISPOSABLE) ×3 IMPLANT
GOWN STRL REIN XL XLG (GOWN DISPOSABLE) ×6 IMPLANT
KIT BASIN OR (CUSTOM PROCEDURE TRAY) ×3 IMPLANT
MARKER SKIN DUAL TIP RULER LAB (MISCELLANEOUS) ×3 IMPLANT
MESH PARIETEX 20X15 (Mesh General) ×1 IMPLANT
NS IRRIG 1000ML POUR BTL (IV SOLUTION) ×3 IMPLANT
PACK UNIVERSAL I (CUSTOM PROCEDURE TRAY) ×1 IMPLANT
SCISSORS LAP 5X35 DISP (ENDOMECHANICALS) ×3 IMPLANT
SET IRRIG TUBING LAPAROSCOPIC (IRRIGATION / IRRIGATOR) ×1 IMPLANT
SOLUTION ANTI FOG 6CC (MISCELLANEOUS) ×3 IMPLANT
STAPLER VISISTAT 35W (STAPLE) ×3 IMPLANT
STRIP CLOSURE SKIN 1/2X4 (GAUZE/BANDAGES/DRESSINGS) ×3 IMPLANT
SUT GORETEX NAB #0 THX26 36IN (SUTURE) ×4 IMPLANT
SUT MNCRL AB 4-0 PS2 18 (SUTURE) ×3 IMPLANT
SUT PROLENE 0 CT 1 CR/8 (SUTURE) ×1 IMPLANT
SUT PROLENE 2 0 SH DA (SUTURE) IMPLANT
TACKER 5MM HERNIA 3.5CML NAB (ENDOMECHANICALS) ×2 IMPLANT
TAPE CLOTH 4X10 WHT NS (GAUZE/BANDAGES/DRESSINGS) ×1 IMPLANT
TOWEL OR 17X26 10 PK STRL BLUE (TOWEL DISPOSABLE) ×4 IMPLANT
TRAY FOLEY CATH 14FRSI W/METER (CATHETERS) ×2 IMPLANT
TRAY LAP CHOLE (CUSTOM PROCEDURE TRAY) ×3 IMPLANT
TROCAR BALLN 12MMX100 BLUNT (TROCAR) ×2 IMPLANT
TROCAR BLADELESS OPT 5 150 (ENDOMECHANICALS) ×4 IMPLANT
TROCAR BLADELESS OPT 5 75 (ENDOMECHANICALS) ×4 IMPLANT
TROCAR XCEL NON-BLD 11X100MML (ENDOMECHANICALS) ×3 IMPLANT
TUBING INSUFFLATION 10FT LAP (TUBING) ×3 IMPLANT

## 2013-01-02 NOTE — H&P (Signed)
Brittany [name] is a 32 y.o. female. This patient is referred by Dr. Wilford Sports for evaluation of an umbilical hernia. She has known about this for the last 3-4 years and has been increasing in size until about the last year which he says it has been stable in size. However, recently it is becoming more uncomfortable and she actually had a some nausea and some bleeding from around the umbilicus and she presented to [facility]. She again presented to the emergency room this time at [facility] where she was seen in the emergency room againwith periumbilical pain. She was evaluated by the surgeon to reduce the hernia but she said the hernia came right back. She says that she's had blood from around the umbilicus about 3 times as well as some blood in in her stool and some vomiting. She's describes this as "sore" but essentially her symptoms have been stable. She says that the nausea and vomiting is independent of her hernias and she has had this for some time as well. She has not had any CT scan because of her weight and the inability to fit on the scanner. She is diabetic and she says that her sugars run at 200-230 range.  She has not had much discomfort from the last time that I saw her but her sugars are still in the 200 range.  No leakage from the skin  HPI  Past Medical History   Diagnosis  Date   .  Diabetes mellitus without complication    .  Lymphedema     Past Surgical History   Procedure  Laterality  Date   .  Cesarean section   11/09/01    Family History   Problem  Relation  Age of Onset   .  Cancer  Mother      leukemia   Social History  History   Substance Use Topics   .  Smoking status:  Never Smoker   .  Smokeless tobacco:  Not on file   .  Alcohol Use:  No    Allergies   Allergen  Reactions   .  Latex  Itching, Swelling and Rash   .  Sulfa Antibiotics  Nausea And Vomiting and Rash    Current Outpatient Prescriptions   Medication  Sig  Dispense  Refill   .   metFORMIN (GLUCOPHAGE) 500 MG tablet  Take 500 mg by mouth daily.     .  ondansetron (ZOFRAN) 4 MG tablet  Take 1 tablet (4 mg total) by mouth every 6 (six) hours as needed for nausea.  20 tablet  0    No current facility-administered medications for this visit.   Review of Systems  Review of Systems  All other review of systems negative or noncontributory except as stated in the HPI  Wt Readings from Last 3 Encounters:  12/28/12 561 lb (254.468 kg)  12/06/12 565 lb 3.2 oz (256.373 kg)  11/26/12 563 lb (255.375 kg)   Temp Readings from Last 3 Encounters:  01/02/13 98.6 F (37 C) Oral  01/02/13 98.6 F (37 C) Oral  12/28/12 97.7 F (36.5 C) Oral   BP Readings from Last 3 Encounters:  01/02/13 148/98  01/02/13 148/98  12/28/12 135/86   Pulse Readings from Last 3 Encounters:  01/02/13 89  01/02/13 89  12/28/12 84    Physical Exam  Physical Exam  Physical Exam  Nursing note and vitals reviewed.  Constitutional: She is oriented to person, place, and  time. She appears well-developed and well-nourished. No distress.  HENT:  Head: Normocephalic and atraumatic.  Mouth/Throat: No oropharyngeal exudate.  Eyes: Conjunctivae and EOM are normal. Pupils are equal, round, and reactive to light. Right eye exhibits no discharge. Left eye exhibits no discharge. No scleral icterus.  Neck: Normal range of motion. Neck supple. No tracheal deviation present.  Cardiovascular: Normal rate, regular rhythm, normal heart sounds and intact distal pulses.  Pulmonary/Chest: Effort normal and breath sounds normal. No stridor. No respiratory distress. She has no wheezes.  Abdominal: Soft. Bowel sounds are normal. She exhibits no distension and no mass. There is no tenderness. There is no rebound and no guarding. She has a moderate sized umbilical hernia with some slight thickening of the skin and excoriation of the periumbilical skin where the hernia is chaffing on the adjacent skin. No bleeding and no  cellulitis. Minimal tenderness.  Musculoskeletal: Normal range of motion. She has significant edema and LE swelling and no tenderness.  Neurological: She is alert and oriented to person, place, and time.  Skin: Skin is warm and dry. periumbilical rash noted. She is not diaphoretic. No erythema. No pallor.  Psychiatric: She has a normal mood and affect. Her behavior is normal. Judgment and thought content normal.  Data Reviewed  ER records and labs  Assessment  Umbilical hernia Her skin looks better than previously.  I still think that laparoscopic repair will be best for her due to the thin skin and body habitus and poorly controlled diabetes which will make her very high risk for wound complications with open repair.  I discussed with her the risks of the procedure including infection, bleeding, pain, scarring, recurrence, bowel injury, chronic pain, persistent bulge and seroma at umbilicus and poor cosmesis and need for umbilical revision, and need for open surgery.  She expressed understanding and desires to proceed with lap umbilical hernia repair with mesh.

## 2013-01-02 NOTE — Preoperative (Signed)
Beta Blockers   Reason not to administer Beta Blockers:Not Applicable 

## 2013-01-02 NOTE — Consult Note (Addendum)
[facility] Medical Consultation  Brittany [name]:[id number] DOB: 04/24/1981 DOA: 01/02/2013 PCP: Orbie Hurst, NP   Requesting physician: Dr. Biagio Quint (surgery) Date of consultation: 01/02/2013 Reason for consultation: diabetes  HPI:  32 year old female who presented to [facility] 01/02/2013 for an ongoing umbilical hernia and associated problems related to including but not limited to bleeding from the umbilical area. Patient underwent laparoscopic umbilical hernia repair 01/02/2013 with no subsequent complications. Internal medicine consultation was requested for diabetes management.  Assessment and Plan:  Diabetes - currently on metformin only - CBG's in past 24 hours: 264, 262, 233 - check A1c to assess glycemic control, may use sliding scale for now - appreciate diabetic coordinator consultation and their recommendations; will have a better idea about diabetic needs after A1c result available  I will followup again tomorrow. Please contact me if I can be of assistance in the meanwhile. Thank you for this consultation.  Manson Passey [facility] [phone] or cell# [phone]  Review of Systems:  Constitutional: Negative for fever, chills, diaphoresis, activity change, appetite change and fatigue.  HENT: Negative for ear pain, nosebleeds, congestion, facial swelling, rhinorrhea, neck pain, neck stiffness and ear discharge.   Eyes: Negative for pain, discharge, redness, itching and visual disturbance.  Respiratory: Negative for cough, choking, chest tightness, shortness of breath, wheezing and stridor.   Cardiovascular: Negative for chest pain, palpitations and leg swelling.  Gastrointestinal: Negative for abdominal distention.  Genitourinary: Negative for dysuria, urgency, frequency, hematuria, flank pain, decreased urine volume, difficulty urinating and dyspareunia.  Musculoskeletal: Negative for back pain, joint swelling, arthralgias and gait problem.  Neurological: Negative for  dizziness, tremors, seizures, syncope, facial asymmetry, speech difficulty, weakness, light-headedness, numbness and headaches.  Hematological: Negative for adenopathy. Does not bruise/bleed easily.  Psychiatric/Behavioral: Negative for hallucinations, behavioral problems, confusion, dysphoric mood, decreased concentration and agitation.    Past Medical History  Diagnosis Date  . Diabetes mellitus without complication   . Lymphedema   . Sleep apnea     DX with sleep apnea - unable to use c-pap  . Umbilical hernia    Past Surgical History  Procedure Laterality Date  . Cesarean section  11/09/01   Social History:  reports that she has never smoked. She does not have any smokeless tobacco history on file. She reports that she does not drink alcohol or use illicit drugs.  Allergies  Allergen Reactions  . Latex Itching, Swelling and Rash  . Sulfa Antibiotics Nausea And Vomiting and Rash   Family History  Problem Relation Age of Onset  . Cancer Mother     leukemia    Prior to Admission medications   Medication Sig Start Date End Date Taking? Authorizing Provider  metFORMIN (GLUCOPHAGE) 500 MG tablet Take 500 mg by mouth daily.    Historical Provider, MD  ondansetron (ZOFRAN) 4 MG tablet Take 1 tablet (4 mg total) by mouth every 6 (six) hours as needed for nausea. 11/26/12   Dione Booze, MD   Physical Exam: Blood pressure 116/74, pulse 90, temperature 98.3 F (36.8 C), temperature source Oral, resp. rate 29, SpO2 95.00%. Filed Vitals:   01/02/13 1645 01/02/13 1700 01/02/13 1710 01/02/13 1728  BP: 124/63 119/53  116/74  Pulse: 75 77 77 90  Temp:    98.3 F (36.8 C)  TempSrc:      Resp: 23 20 21 29   SpO2: 93% 98% 93% 95%     General:  No acute distress  Cardiovascular: S1, S2, RRR  Respiratory: clear  to auscultations  Abdomen: soft, non distended  Skin: warm, dry  Labs on Admission:  Basic Metabolic Panel:  Recent Labs Lab 12/28/12 1135  NA 135  K 3.8  CL 100   CO2 24  GLUCOSE 346*  BUN 9  CREATININE 0.63  CALCIUM 9.1   Liver Function Tests: No results found for this basename: AST, ALT, ALKPHOS, BILITOT, PROT, ALBUMIN,  in the last 168 hours No results found for this basename: LIPASE, AMYLASE,  in the last 168 hours No results found for this basename: AMMONIA,  in the last 168 hours CBC:  Recent Labs Lab 12/28/12 1135  WBC 5.2  NEUTROABS 3.3  HGB 13.1  HCT 39.5  MCV 86.8  PLT 183   Cardiac Enzymes: No results found for this basename: CKTOTAL, CKMB, CKMBINDEX, TROPONINI,  in the last 168 hours BNP: No components found with this basename: POCBNP,  CBG:  Recent Labs Lab 01/02/13 0940 01/02/13 1511  GLUCAP 234* 264*    Radiological Exams on Admission: No results found.   Time spent: 75 minutes  Manson Passey [facility] Pager [phone]  If 7PM-7AM, please contact night-coverage [email or web address] Password [facility] 01/02/2013, 6:01 PM

## 2013-01-02 NOTE — Anesthesia Postprocedure Evaluation (Signed)
Anesthesia Post Note  Patient: Brittany [name]  Procedure(s) Performed: Procedure(s) (LRB): LAPAROSCOPIC UMBILICAL HERNIA (N/A) INSERTION OF MESH (N/A)  Anesthesia type: General  Patient location: PACU  Post pain: Pain level controlled  Post assessment: Post-op Vital signs reviewed  Last Vitals:  Filed Vitals:   01/02/13 1645  BP: 124/63  Pulse: 75  Temp:   Resp: 23    Post vital signs: Reviewed  Level of consciousness: sedated  Complications: No apparent anesthesia complications

## 2013-01-02 NOTE — Transfer of Care (Signed)
Immediate Anesthesia Transfer of Care Note  Patient: Brittany [name]  Procedure(s) Performed: Procedure(s): LAPAROSCOPIC UMBILICAL HERNIA (N/A) INSERTION OF MESH (N/A)  Patient Location: PACU  Anesthesia Type:General  Level of Consciousness: awake, alert  and oriented  Airway & Oxygen Therapy: Patient Spontanous Breathing and Patient connected to face mask oxygen  Post-op Assessment: Report given to PACU RN and Post -op Vital signs reviewed and stable  Post vital signs: Reviewed and stable  Complications: No apparent anesthesia complications

## 2013-01-02 NOTE — Anesthesia Preprocedure Evaluation (Addendum)
Anesthesia Evaluation  Patient identified by MRN, date of birth, ID band Patient awake    Reviewed: Allergy & Precautions, H&P , NPO status , Patient's Chart, lab work & pertinent test results  Airway Mallampati: II TM Distance: >3 FB Neck ROM: Full    Dental  (+) Teeth Intact, Chipped and Dental Advisory Given,    Pulmonary neg pulmonary ROS, sleep apnea ,  breath sounds clear to auscultation  Pulmonary exam normal       Cardiovascular negative cardio ROS  Rhythm:Regular Rate:Normal     Neuro/Psych negative neurological ROS  negative psych ROS   GI/Hepatic negative GI ROS, Neg liver ROS,   Endo/Other  diabetes, Type 2, Oral Hypoglycemic AgentsMorbid obesity  Renal/GU negative Renal ROS  negative genitourinary   Musculoskeletal negative musculoskeletal ROS (+)   Abdominal (+) + obese,   Peds  Hematology negative hematology ROS (+)   Anesthesia Other Findings   Reproductive/Obstetrics                          Anesthesia Physical Anesthesia Plan  ASA: III  Anesthesia Plan: General   Post-op Pain Management:    Induction: Intravenous  Airway Management Planned: Oral ETT  Additional Equipment:   Intra-op Plan:   Post-operative Plan: Extubation in OR  Informed Consent: I have reviewed the patients History and Physical, chart, labs and discussed the procedure including the risks, benefits and alternatives for the proposed anesthesia with the patient or authorized representative who has indicated his/her understanding and acceptance.   Dental advisory given  Plan Discussed with: CRNA  Anesthesia Plan Comments:         Anesthesia Quick Evaluation

## 2013-01-03 ENCOUNTER — Encounter (HOSPITAL_COMMUNITY): Payer: Self-pay | Admitting: General Surgery

## 2013-01-03 LAB — BASIC METABOLIC PANEL
BUN: 5 mg/dL — ABNORMAL LOW (ref 6–23)
Chloride: 103 mEq/L (ref 96–112)
GFR calc Af Amer: >90 mL/min (ref >90)
Glucose, Bld: 193 mg/dL — ABNORMAL HIGH (ref 70–99)
Potassium: 3.4 mEq/L — ABNORMAL LOW (ref 3.5–5.1)
Sodium: 136 mEq/L (ref 135–145)

## 2013-01-03 LAB — CBC
HCT: 36.5 % (ref 36.0–46.0)
Hemoglobin: 11.9 g/dL — ABNORMAL LOW (ref 12.0–15.0)
MCH: 28.5 pg (ref 26.0–34.0)
MCHC: 32.6 g/dL (ref 30.0–36.0)
RBC: 4.17 MIL/uL (ref 3.87–5.11)

## 2013-01-03 LAB — GLUCOSE, CAPILLARY
Glucose-Capillary: 174 mg/dL — ABNORMAL HIGH (ref 70–99)
Glucose-Capillary: 228 mg/dL — ABNORMAL HIGH (ref 70–99)
Glucose-Capillary: 222 mg/dL — ABNORMAL HIGH (ref 70–99)

## 2013-01-03 MED ORDER — KETOROLAC TROMETHAMINE 30 MG/ML IJ SOLN
30.0000 mg | Freq: Four times a day (QID) | INTRAMUSCULAR | Status: DC | PRN
  Administered 2013-01-03: 30 mg via INTRAVENOUS
  Filled 2013-01-03: qty 1

## 2013-01-03 MED ORDER — INSULIN GLARGINE 100 UNIT/ML ~~LOC~~ SOLN
15.0000 [IU] | Freq: Every day | SUBCUTANEOUS | Status: DC
  Administered 2013-01-03 – 2013-01-04 (×2): 15 [IU] via SUBCUTANEOUS
  Filled 2013-01-03 (×3): qty 0.15

## 2013-01-03 MED ORDER — INSULIN GLARGINE 100 UNIT/ML ~~LOC~~ SOLN: 20.0000 [IU] | Freq: Every day | SUBCUTANEOUS | Status: DC

## 2013-01-03 NOTE — Progress Notes (Signed)
Inpatient Diabetes Program Recommendations  AACE/ADA: New Consensus Statement on Inpatient Glycemic Control (2013)  Target Ranges:  Prepandial:   less than 140 mg/dL      Peak postprandial:   less than 180 mg/dL (1-2 hours)      Critically ill patients:  140 - 180 mg/dL   Reason for Visit: Diabetes Consult  Pt states her HgbA1C has improved from 12.% to 9.7% in the last 6 months.  States she has made many changes with her diet since being diagnosed with DM.  Discussed importance of controlling blood sugars to prevent complications and healing after surgery.  Does not wish to go to OP Nutrition and Diabetes classes at this time.  States Mom has diabetes and she has read all about it on the internet.  Results for KAMMI, HECHLER (MRN [id number]) as of 01/03/2013 14:15  Ref. Range 01/02/2013 09:40 01/02/2013 15:11 01/02/2013 18:09 01/02/2013 20:02 01/02/2013 23:35 01/03/2013 03:32 01/03/2013 07:41 01/03/2013 12:00  Glucose-Capillary Latest Range: 70-99 mg/dL 409 (H) 811 (H) 914 (H) 233 (H) 165 (H) 174 (H) 206 (H) 228 (H)  Results for AMIYA, ESCAMILLA (MRN [id number]) as of 01/03/2013 14:15  Ref. Range 01/02/2013 16:02  Hemoglobin A1C Latest Range: <5.7 % 9.7 (H)   Will benefit from addition of basal insulin with Lantus 15 units to begin tonight.  Long discussion regarding importance of checking blood sugars 3 - 4 times / day and f/u with PCP for management of DM.  Recommendations:  Increase Novolog to resistant Q4 hours and increase to tidwc and hs when po intake improves. Agree with Lantus 15 units QHS. Immediate f/u after discharge with PCP for diabetes management.  Thank you. Ailene Ards, RD, LDN, CDE Inpatient Diabetes Coordinator [phone]

## 2013-01-03 NOTE — Op Note (Signed)
[name], [name] NO.:  [id number]  MEDICAL RECORD NO.:  [id number]  LOCATION:  1537                         FACILITY:  [facility]  PHYSICIAN:  Lodema Pilot, MD       DATE OF BIRTH:  08-01-1980  DATE OF PROCEDURE:  01/02/2013 DATE OF DISCHARGE:                              OPERATIVE REPORT   PROCEDURE:  Laparoscopic ventral hernia repair with mesh.  PREOPERATIVE DIAGNOSIS:  Umbilical hernia.  POSTOPERATIVE DIAGNOSIS:  Ventral hernia.  ANESTHESIA:  General endotracheal tube anesthesia.  SURGEON:  Lodema Pilot, MD  ASSISTANT:  None.  FLUIDS:  2 L of crystalloid.  ESTIMATED BLOOD LOSS:  75 mL.  DRAINS:  None.  SPECIMENS:  None.  COMPLICATIONS:  None apparent.  FINDINGS:  Three fascial defects in the midline measuring 9 cm from a cranial caudad direction to 5 cm in left-to-right direction, laparoscopic repair using a 15 cm x 20 cm Parietex mesh.  INDICATION FOR PROCEDURE:  Brittany [name] is a 32 year old morbidly obese female with a known longstanding umbilical hernia, which had been increasing in size.  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  OPERATIVE DETAILS:  Brittany [name] was seen and evaluated in the preoperative area and risks and benefits of the procedure were again discussed in lay terms.  Informed consent was obtained.  I discussed with her again the options of laparoscopic open repair and felt that laparoscopic repair would be best for her given her size and already the very thin and thickened skin of the umbilicus which has already had some drainage and skin breakdown from the umbilicus.  I thought the wound infection Risk would be to great for her with open repair.  I explained to her that this would not likely fix her umbilicus and the protrusion, and potentially she might need revision of her  umbilicus or cosmesis.  She was given subcu heparin and prophylactic antibiotics and taken to the operating room and placed on the table in supine position.  General endotracheal anesthesia obtained and Foley catheter was placed.  She was secured to the table by taping her in several places due to her large body habitus and she required extenders laterally to keep her on the table.  Her abdomen was prepped and draped in a standard surgical fashion.  An Ioban drape was placed to minimize skin contact.  A 5-mm Optiview trocar was used to access the abdomen in the left upper quadrant and pneumoperitoneum was obtained.  Laparoscope was introduced and there was no evidence of any bleeding or bowel injury upon entry.  She was noted to have umbilical hernia which appeared to be containing fat and omentum.  There was no evidence of any bowel in the hernia or adhered to the abdominal wall. An 11 mm left lateral abdominal trocar and a 5 mm left lower quadrant trocar were placed under direct visualization and two 5-mm right lateral abdominal trocars were placed under direct visualization.  Using sharp dissection, I took down the fat from the abdominal wall  and this revealed another small defect in the cranial aspect to the umbilicus.  I continued to reduce the fat from visible hernias and this actually revealed 2 underlying fascial defects.  She had a significant amount of omental fat contained in the hernia defect and this was all reduced. Again there was no evidence of any bowel involved.  I did use some Bovie electrocautery for some bleeding vessels in the omentum that was coursing up into the hernia sac.  Care was taken to ensure that the bowel was not involved or affected by the cautery.  After reducing all of the fat from the hernia sac, she was left with 2 larger defects and a smaller defect, all in the midline standing 9 cm cranial to caudad and about 5 cm in the lateral direction.  I  measured this intra-abdominally. I selected a 20 cm x 15 cm Parietex coated mesh and placed 0 Gore-Tex sutures in 4 quadrants at the 12 o'clock, 3 o'clock, 6 o'clock, and 9 o'clock positions on the mesh and rolled this up and placed it in the abdomen through the trocar after I had investigated the omentum and certified that there was no active bleeding from the omentum and no evidence of any bowel injury.  I brought up the inferior suture first through the abdominal wall with at least 5 cm of overlap.  I initially pulled the sutures out with 5 cm of overlap, measured out intra- abdominally.  After this, mesh was actually too large and so I actually brought this suture down with even further overlap inferiorly, so was actually about 7 cm of overlaps in both directions.  The superior suture was then brought up so the mesh lie flat and then the lateral sutures were also brought up to the abdominal wall with the laparoscopic suture passer.  The mesh appeared to lie flat and cover widely on all of the defects, so I secured the sutures.  Then, the secure straps tacking device was used to tack the mesh circumferentially at the periphery so that bowel would not herniate up around the edge of the mesh. Additional 4 stay sutures were placed with 0 Prolene sutures at 5 cm intervals between the other previously placed Gore-Tex sutures and these were also secured.  Additional tacks were placed in the mesh for added security and again the mesh appeared to lie flat and cover all the defects widely.  I again inspected the omentum for hemostasis, which was noted to be adequate and there was no evidence of bowel injury.  The abdominal wall was noted to be hemostatic and I closed the 11 mm trocar with a 0 Vicryl suture using Endoclose device.  All the trocars were then removed under direct visualization and the abdominal wall was noted to be hemostatic.  The skin edges were approximated with 4-0  Monocryl subcuticular suture.  The skin was washed and dried and all of the skin incisions were approximated with Dermabond for dressing.  The transfascial suture sites were approximated with Dermabond as well.  She still had some protrusion of her skin at the umbilicus due to excess skin and so I filled her umbilicus with 4 x 4 gauze and covered this with Tegaderm and performed usual sterile suction dressing at the umbilicus to minimize seroma formation at the umbilicus.  All sponge, needle, and instrument counts were correct at end of the case and the patient tolerated the procedure well without apparent complications. She was extubated in the operating room  and Foley catheter was left in place.  She was stable and ready for transfer to the recovery room in stable condition.  I explained to the patient and family that I was concerned for the skin of the umbilicus and plan was to observe this for viability since this was so thin and it could potentially be relying on some of the underlying fat that was up in the hernia sac for some of the vascularity.  The patient was stable and ready for transfer to the recovery room.          ______________________________ Lodema Pilot, MD     BL/MEDQ  D:  01/02/2013  T:  01/03/2013  Job:  [id number]

## 2013-01-03 NOTE — Progress Notes (Signed)
Pain with movement otherwise okay.  Still on liquids.  Vitals okay.  Will advance diet.  Diabetes management per hospitalist.

## 2013-01-03 NOTE — Progress Notes (Signed)
1 Day Post-Op  Subjective: Doing okay.  Abdomen sore.  No nausea  Objective: Vital signs in last 24 hours: Temp:  [97.5 F (36.4 C)-98.8 F (37.1 C)] 98.7 F (37.1 C) (06/26 0500) Pulse Rate:  [65-93] 89 (06/26 0500) Resp:  [20-29] 22 (06/26 0500) BP: (116-148)/(53-98) 144/76 mmHg (06/26 0500) SpO2:  [91 %-100 %] 98 % (06/26 0500) Weight:  [562 lb (254.922 kg)] 562 lb (254.922 kg) (06/25 1728) Last BM Date: 01/02/13  Intake/Output from previous day: 06/25 0701 - 06/26 0700 In: 3885.4 [I.V.:3885.4] Out: 1925 [Urine:1850; Blood:75] Intake/Output this shift:    General appearance: alert, cooperative and no distress Resp: nonlabored Cardio: normal rate, regular GI: soft, appropriate tenderness, ND, wounds without infection, no peritoneal signs, umbilical dressing intact  Lab Results:   Recent Labs  01/03/13 0438  WBC 6.9  HGB 11.9*  HCT 36.5  PLT 165   BMET  Recent Labs  01/03/13 0438  NA 136  K 3.4*  CL 103  CO2 24  GLUCOSE 193*  BUN 5*  CREATININE 0.64  CALCIUM 8.6   PT/INR No results found for this basename: LABPROT, INR,  in the last 72 hours ABG No results found for this basename: PHART, PCO2, PO2, HCO3,  in the last 72 hours  Studies/Results: No results found.  Anti-infectives: Anti-infectives   Start     Dose/Rate Route Frequency Ordered Stop   01/02/13 0907  ceFAZolin (ANCEF) 3 g in dextrose 5 % 50 mL IVPB     3 g 160 mL/hr over 30 Minutes Intravenous On call to O.R. 01/02/13 0907 01/02/13 1144      Assessment/Plan: s/p Procedure(s): LAPAROSCOPIC UMBILICAL HERNIA (N/A) INSERTION OF MESH (N/A) advance diet.  mobility as tolerated.  work on glucose control and hopefully home tomorrow if pain managed and tolerating diet.  LOS: 1 day    Lodema Pilot DAVID 01/03/2013

## 2013-01-03 NOTE — Care Management Note (Signed)
    Page 1 of 1   01/03/2013     12:44:27 PM   CARE MANAGEMENT NOTE 01/03/2013  Patient:  Brittany [name], Brittany [name]   Account Number:  [id number]  Date Initiated:  01/03/2013  Documentation initiated by:  Lorenda Ishihara  Subjective/Objective Assessment:   32 yo female admitted s/p ventral hernia repair. PTA lived at home with spouse.     Action/Plan:   Home when stable   Anticipated DC Date:  01/04/2013   Anticipated DC Plan:  HOME/SELF CARE      DC Planning Services  CM consult      Choice offered to / List presented to:             Status of service:  Completed, signed off Medicare Important Message given?   (If response is "NO", the following Medicare IM given date fields will be blank) Date Medicare IM given:   Date Additional Medicare IM given:    Discharge Disposition:  HOME/SELF CARE  Per UR Regulation:  Reviewed for med. necessity/level of care/duration of stay  If discussed at Long Length of Stay Meetings, dates discussed:    Comments:

## 2013-01-04 ENCOUNTER — Telehealth (INDEPENDENT_AMBULATORY_CARE_PROVIDER_SITE_OTHER): Payer: Self-pay | Admitting: General Surgery

## 2013-01-04 LAB — GLUCOSE, CAPILLARY
Glucose-Capillary: 161 mg/dL — ABNORMAL HIGH (ref 70–99)
Glucose-Capillary: 162 mg/dL — ABNORMAL HIGH (ref 70–99)
Glucose-Capillary: 201 mg/dL — ABNORMAL HIGH (ref 70–99)

## 2013-01-04 MED ORDER — INSULIN GLARGINE 100 UNIT/ML SOLOSTAR PEN: 15.0000 [IU] | PEN_INJECTOR | Freq: Every day | SUBCUTANEOUS | Status: DC

## 2013-01-04 MED ORDER — ENOXAPARIN SODIUM 40 MG/0.4ML ~~LOC~~ SOLN: 40.0000 mg | SUBCUTANEOUS | Status: DC

## 2013-01-04 MED ORDER — ENOXAPARIN SODIUM 40 MG/0.4ML ~~LOC~~ SOLN
40.0000 mg | SUBCUTANEOUS | Status: DC
  Administered 2013-01-04: 40 mg via SUBCUTANEOUS
  Filled 2013-01-04 (×2): qty 0.4

## 2013-01-04 MED ORDER — METFORMIN HCL 500 MG PO TABS
1000.0000 mg | ORAL_TABLET | Freq: Every day | ORAL | Status: DC
  Administered 2013-01-05: 1000 mg via ORAL
  Filled 2013-01-04 (×2): qty 2

## 2013-01-04 MED ORDER — ONDANSETRON 4 MG PO TBDP: 4.0000 mg | ORAL_TABLET | Freq: Three times a day (TID) | ORAL | Status: DC | PRN

## 2013-01-04 MED ORDER — METFORMIN HCL 1000 MG PO TABS: 1000.0000 mg | ORAL_TABLET | Freq: Two times a day (BID) | ORAL | Status: DC

## 2013-01-04 MED ORDER — METFORMIN HCL 1000 MG PO TABS: 1000.0000 mg | ORAL_TABLET | Freq: Every day | ORAL | Status: DC

## 2013-01-04 MED ORDER — INSULIN GLARGINE 100 UNIT/ML ~~LOC~~ SOLN: 15.0000 [IU] | Freq: Every day | SUBCUTANEOUS | Status: DC

## 2013-01-04 MED ORDER — OXYCODONE-ACETAMINOPHEN 5-325 MG PO TABS: 1.0000 | ORAL_TABLET | Freq: Four times a day (QID) | ORAL | Status: DC | PRN

## 2013-01-04 MED ORDER — ENOXAPARIN (LOVENOX) PATIENT EDUCATION KIT
PACK | Freq: Once | Status: AC
  Administered 2013-01-04: 21:00:00
  Filled 2013-01-04: qty 1

## 2013-01-04 NOTE — Progress Notes (Signed)
[facility] PROGRESS NOTE  Assessment/Plan: DM II: - poorly control. - increased Insulin and metformin have given her prescription.  - education perform, meter prescription given along with syringes prescriptions. - needs follow up with PCP.  Code Status: full Family Communication: none  Disposition Plan: inpatient.    HPI/Subjective: No compalins  Objective: Filed Vitals:   01/03/13 1000 01/03/13 1400 01/03/13 2124 01/04/13 0558  BP: 138/80 153/90 143/78 128/98  Pulse: 80 86 85 97  Temp: 98.6 F (37 C) 98.7 F (37.1 C) 99 F (37.2 C) 98.4 F (36.9 C)  TempSrc: Oral Oral Oral Oral  Resp: 20 18 18 20   Height:      Weight:      SpO2: 99% 92% 93% 99%    Intake/Output Summary (Last 24 hours) at 01/04/13 0912 Last data filed at 01/04/13 0600  Gross per 24 hour  Intake   3000 ml  Output    850 ml  Net   2150 ml   Filed Weights   01/02/13 1728  Weight: 254.922 kg (562 lb)    Exam:  General: Alert, awake, oriented x3, in no acute distress. Morbidly obese. HEENT: No bruits, no goiter.  Heart: Regular rate and rhythm, without murmurs, rubs, gallops.  Lungs: Good air movement, bilateral air movement.  Abdomen: Soft, nontender, nondistended, positive bowel sounds.  Neuro: Grossly intact, nonfocal.   Data Reviewed: Basic Metabolic Panel:  Recent Labs Lab 12/28/12 1135 01/03/13 0438  NA 135 136  K 3.8 3.4*  CL 100 103  CO2 24 24  GLUCOSE 346* 193*  BUN 9 5*  CREATININE 0.63 0.64  CALCIUM 9.1 8.6   Liver Function Tests: No results found for this basename: AST, ALT, ALKPHOS, BILITOT, PROT, ALBUMIN,  in the last 168 hours No results found for this basename: LIPASE, AMYLASE,  in the last 168 hours No results found for this basename: AMMONIA,  in the last 168 hours CBC:  Recent Labs Lab 12/28/12 1135 01/03/13 0438  WBC 5.2 6.9  NEUTROABS 3.3  --   HGB 13.1 11.9*  HCT 39.5 36.5  MCV 86.8 87.5  PLT 183 165   Cardiac Enzymes: No results  found for this basename: CKTOTAL, CKMB, CKMBINDEX, TROPONINI,  in the last 168 hours BNP (last 3 results) No results found for this basename: PROBNP,  in the last 8760 hours CBG:  Recent Labs Lab 01/03/13 1929 01/03/13 2125 01/04/13 0007 01/04/13 0421 01/04/13 0755  GLUCAP 168* 175* 171* 161* 184*    Recent Results (from the past 240 hour(s))  SURGICAL PCR SCREEN     Status: Abnormal   Collection Time    12/28/12 11:03 AM      Result Value Range Status   MRSA, PCR NEGATIVE  NEGATIVE Final   Staphylococcus aureus POSITIVE (*) NEGATIVE Final   Comment:            The Xpert SA Assay (FDA     approved for NASAL specimens     in patients over 12 years of age),     is one component of     a comprehensive surveillance     program.  Test performance has     been validated by [employer] for patients greater     than or equal to 38 year old.     It is not intended     to diagnose infection nor to     guide or monitor treatment.  Studies: No results found.  Scheduled Meds: . heparin subcutaneous  5,000 Units Subcutaneous Q8H  . insulin aspart  0-15 Units Subcutaneous Q4H  . insulin glargine  15 Units Subcutaneous QHS  . [START ON 01/05/2013] metFORMIN  1,000 mg Oral Q breakfast   Continuous Infusions: . 0.9 % NaCl with KCl 20 mEq / L 30 mL/hr at 01/04/13 0805     Marinda Elk  [facility] Pager [phone]. If 8PM-8AM, please contact night-coverage at [email or web address], password [facility] 01/04/2013, 9:13 AM  LOS: 2 days

## 2013-01-04 NOTE — Progress Notes (Signed)
Inpatient Diabetes Program Recommendations  AACE/ADA: New Consensus Statement on Inpatient Glycemic Control (2013)  Target Ranges:  Prepandial:   less than 140 mg/dL      Peak postprandial:   less than 180 mg/dL (1-2 hours)      Critically ill patients:  140 - 180 mg/dL   Reason for Visit: Insulin Administration  Pt being discharged today.  Taught pt insulin administration with pen and pt was able to return demonstration.  Reviewed hypoglycemia s/s and treatment.  Gave CD with instructions for pen if needed.  Pt states she will f/u with PCP and take logbook for review and adjustments. Verbalized understanding.  Discussed with RN.  Thank you. Ailene Ards, RD, LDN, CDE Inpatient Diabetes Coordinator [phone]

## 2013-01-04 NOTE — Telephone Encounter (Signed)
Warden Fillers [phone] called to let us know pt changed her mind about going home today, she will go home tomorrow.

## 2013-01-04 NOTE — Progress Notes (Signed)
Inpatient Diabetes Program Recommendations  AACE/ADA: New Consensus Statement on Inpatient Glycemic Control (2013)  Target Ranges:  Prepandial:   less than 140 mg/dL      Peak postprandial:   less than 180 mg/dL (1-2 hours)      Critically ill patients:  140 - 180 mg/dL   Reason for Visit: Hyperglycemia  Results for CARILYN, WOOLSTON (MRN [id number]) as of 01/04/2013 09:10  Ref. Range 01/03/2013 19:29 01/03/2013 21:25 01/04/2013 00:07 01/04/2013 04:21 01/04/2013 07:55  Glucose-Capillary Latest Range: 70-99 mg/dL 086 (H) 578 (H) 469 (H) 161 (H) 184 (H)  Results for SHAQUINTA, PERUSKI (MRN [id number]) as of 01/04/2013 09:10  Ref. Range 01/02/2013 16:02  Hemoglobin A1C Latest Range: <5.7 % 9.7 (H)   Blood sugars much improved with starting basal insulin - Lantus 15 units.   Pt wants to delay starting insulin at home until she sees PCP for f/u. Discussed importance of good glycemic control and encouraged compliance with diet and exercise.  Informed of benefits of beginning basal insulin at home. Discussed HgbA1C goal of 7.0%.  Thank you. Ailene Ards, RD, LDN, CDE Inpatient Diabetes Coordinator [phone]

## 2013-01-04 NOTE — Progress Notes (Signed)
2 Days Post-Op  Subjective: Pain improved.  Had some nausea last night but has been eating graham crackers throughout the night without difficulty. Says that she has been more mobile   Objective: Vital signs in last 24 hours: Temp:  [98.4 F (36.9 C)-99 F (37.2 C)] 98.4 F (36.9 C) (06/27 0558) Pulse Rate:  [80-97] 97 (06/27 0558) Resp:  [18-20] 20 (06/27 0558) BP: (128-153)/(78-98) 128/98 mmHg (06/27 0558) SpO2:  [92 %-99 %] 99 % (06/27 0558) Last BM Date: 01/02/13  Intake/Output from previous day: 06/26 0701 - 06/27 0700 In: 3000 [I.V.:3000] Out: 850 [Urine:850] Intake/Output this shift:    General appearance: alert, cooperative and no distress Resp: nonlabored Cardio: normal rate, regular GI: soft, appropriate tenderness around hernia, no evidence of recurrence, wounds without infection, umbilical dressing removed and skin at umbilicus appears bruised but looks like it is viable.    Lab Results:   Recent Labs  01/03/13 0438  WBC 6.9  HGB 11.9*  HCT 36.5  PLT 165   BMET  Recent Labs  01/03/13 0438  NA 136  K 3.4*  CL 103  CO2 24  GLUCOSE 193*  BUN 5*  CREATININE 0.64  CALCIUM 8.6   PT/INR No results found for this basename: LABPROT, INR,  in the last 72 hours ABG No results found for this basename: PHART, PCO2, PO2, HCO3,  in the last 72 hours  Studies/Results: No results found.  Anti-infectives: Anti-infectives   Start     Dose/Rate Route Frequency Ordered Stop   01/02/13 0907  ceFAZolin (ANCEF) 3 g in dextrose 5 % 50 mL IVPB     3 g 160 mL/hr over 30 Minutes Intravenous On call to O.R. 01/02/13 0907 01/02/13 1144      Assessment/Plan: s/p Procedure(s): LAPAROSCOPIC UMBILICAL HERNIA (N/A) INSERTION OF MESH (N/A) says that she is moving better but still sore with movement as expected.  will see how she does with her meals today.  if tolerating diet and she feels okay, then she could probably go home today, otherwise she should be okay for  discharge this weekend. monitor umbilicus   LOS: 2 days    Lodema Pilot DAVID 01/04/2013

## 2013-01-04 NOTE — Discharge Summary (Signed)
Physician Discharge Summary  Patient ID: Brittany [name] MRN: [id number] DOB/AGE: September 21, 1980 32 y.o.  Admit date: 01/02/2013 Discharge date: 01/04/2013  Admission Diagnoses: ventral hernia  Discharge Diagnoses: same Active Problems:   Type II or unspecified type diabetes mellitus without mention of complication, uncontrolled   Discharged Condition: stable  Hospital Course: to OR 01/02/13 for lap umbilical hernia repair with mesh.  No apparent complications.  IM medicine consulted for management of DM.  She was started on lantus and SSI.  Her diet was advanced on POD 1 and her pain continued to improve.  Mobility increased although still limited due to body habitus (limited at baseline).  On POD 2 her pain continued to improve and she was stable for discharge to home.  Consults: IM for DM  Significant Diagnostic Studies: none  Treatments: surgery: 01/02/13 lap ventral hernia repair with mesh  Disposition: 01-Home or Self Care  Discharge Orders   Future Appointments Provider Department Dept Phone   01/17/2013 1:00 PM Lodema Pilot, DO [facility], [place] [phone]   Future Orders Complete By Expires     Call MD for:  difficulty breathing, headache or visual disturbances  As directed     Call MD for:  persistant nausea and vomiting  As directed     Call MD for:  redness, tenderness, or signs of infection (pain, swelling, redness, odor or green/yellow discharge around incision site)  As directed     Call MD for:  severe uncontrolled pain  As directed     Call MD for:  temperature >100.4  As directed     Call MD for:  As directed     Comments:      Increased darkening of the skin at the belly button    Diet - low sodium heart healthy  As directed     Discharge instructions  As directed     Comments:      Call your primary doctor as soon as possible to schedule follow up for diabetes management.   Call [phone] for follow up appointment with Dr. Biagio Quint in about 3  weeks. Increase ambulation as soon as possible. No lifting more than 10 lbs for 4 weeks. Tape dry gauze in belly button and change daily and as needed.  Take Lovenox as prescribed for 2 weeks    Increase activity slowly  As directed         Medication List    TAKE these medications       enoxaparin 40 MG/0.4ML injection  Commonly known as:  LOVENOX  Inject 0.4 mLs (40 mg total) into the skin daily.     Insulin Glargine 100 UNIT/ML Sopn  Commonly known as:  LANTUS SOLOSTAR  Inject 15 Units into the skin at bedtime.     metFORMIN 1000 MG tablet  Commonly known as:  GLUCOPHAGE  Take 1 tablet (1,000 mg total) by mouth 2 (two) times daily with a meal.  Start taking on:  01/05/2013     ondansetron 4 MG disintegrating tablet  Commonly known as:  ZOFRAN ODT  Take 1 tablet (4 mg total) by mouth every 8 (eight) hours as needed for nausea.     ondansetron 4 MG tablet  Commonly known as:  ZOFRAN  Take 1 tablet (4 mg total) by mouth every 6 (six) hours as needed for nausea.     oxyCODONE-acetaminophen 5-325 MG per tablet  Commonly known as:  PERCOCET/ROXICET  Take 1-2 tablets by mouth every 6 (six) hours  as needed.           Follow-up Information   Follow up with Kassidy Frankson DAVID, DO.   Contact information:   [address] [place] [address] [phone]       Signed: Lodema Pilot DAVID 01/04/2013, 2:49 PM

## 2013-01-04 NOTE — Progress Notes (Signed)
Paged Dr. Biagio Quint and informed personnel in his office that patient decided that she no longer wants to be discharged today because reports having pain.  Left phone number to call me back if needed.

## 2013-01-04 NOTE — Progress Notes (Signed)
Doing okay.  Tolerating diet.  Only having nausea with exacerbations of pain.  Vitals okay.  Umbilicus bruised but looks viable.  Looks better than this morning.  I offered to keep her here vs. Discharge since her husband is doing most of the work helping her anyway.  She would like to go home today.  I think that she is medically stable for discharge and there is no evidence of any postoperative complication.  I will write for lovenox for 2 weeks until she is more mobile.

## 2013-01-05 NOTE — Progress Notes (Signed)
Patient ID: Brittany [name], female   DOB: September 19, 1980, 32 y.o.   MRN: [id number] 3 Days Post-Op  Subjective: Pt not discharged yesterday due to issues with teaching lovenox and insulin.  She also got sick yesterday evening.    Objective: Vital signs in last 24 hours: Temp:  [98.3 F (36.8 C)-98.9 F (37.2 C)] 98.3 F (36.8 C) (06/28 0615) Pulse Rate:  [83-93] 93 (06/28 0615) Resp:  [18] 18 (06/28 0615) BP: (124-136)/(72-86) 136/76 mmHg (06/28 0615) SpO2:  [95 %-100 %] 95 % (06/28 0615) Last BM Date: 01/02/13  Intake/Output from previous day: 06/27 0701 - 06/28 0700 In: 1637.9 [P.O.:720; I.V.:917.9] Out: 0  Intake/Output this shift:    General appearance: alert, cooperative and no distress Resp: nonlabored Cardio: normal rate, regular GI: soft, appropriate tenderness around hernia, no evidence of recurrence, wounds without infection, umbilical dressing removed and skin at umbilicus appears bruised but looks like it is viable.    Lab Results:   Recent Labs  01/03/13 0438  WBC 6.9  HGB 11.9*  HCT 36.5  PLT 165   BMET  Recent Labs  01/03/13 0438  NA 136  K 3.4*  CL 103  CO2 24  GLUCOSE 193*  BUN 5*  CREATININE 0.64  CALCIUM 8.6   PT/INR No results found for this basename: LABPROT, INR,  in the last 72 hours ABG No results found for this basename: PHART, PCO2, PO2, HCO3,  in the last 72 hours  Studies/Results: No results found.  Anti-infectives: Anti-infectives   Start     Dose/Rate Route Frequency Ordered Stop   01/02/13 0907  ceFAZolin (ANCEF) 3 g in dextrose 5 % 50 mL IVPB     3 g 160 mL/hr over 30 Minutes Intravenous On call to O.R. 01/02/13 0907 01/02/13 1144      Assessment/Plan: s/p Procedure(s): LAPAROSCOPIC UMBILICAL HERNIA (N/A) INSERTION OF MESH (N/A) Pt working with PT for LE edema  Pain control Lantus for DM.  Lovenox prophylactically Plan d/c later today.     LOS: 3 days    [facility] 01/05/2013

## 2013-01-05 NOTE — Progress Notes (Signed)
Assessment unchanged. Pain decreased. In wheelchair prepared to go home. Pt and husband verbalized understanding of dc instructions through teach back. Aware appt already in Epic to see Dr. Biagio Quint on 7/10 and verbalized to nurse. Scripts x 6(5 for meds and 1 for glucometer) given as provided by MD. Reinforced Lovenox teaching and kit in room for dc. Pt stated "I gave myself a dose last night and it's due again at 10 pm this evening." Pt has read material provided in kit. Also reinforced diabetic teaching with pt and husband as well as new Lantus pen administration. Pt verbalized to nurse understanding of administration with no further questions. Diabetic Management Team RN spoke with pt yesterday. Pt understands to follow up Diabetic Management post dc. Discharged via own wheelchair from home to meet awaiting Zenaida Niece at front entrance to carry home. Accompanied by nurse and husband.

## 2013-01-05 NOTE — Progress Notes (Signed)
Dr. Donell Beers aware via phone pt ready to go home. See new order to go ahead with dc home.

## 2013-01-15 ENCOUNTER — Telehealth (INDEPENDENT_AMBULATORY_CARE_PROVIDER_SITE_OTHER): Payer: Self-pay | Admitting: General Surgery

## 2013-01-15 NOTE — Telephone Encounter (Signed)
I called her to follow up and see how she is doing.  Reached voicemail and I left a message.

## 2013-01-17 ENCOUNTER — Encounter (INDEPENDENT_AMBULATORY_CARE_PROVIDER_SITE_OTHER): Payer: Self-pay | Admitting: General Surgery

## 2013-01-17 ENCOUNTER — Ambulatory Visit (INDEPENDENT_AMBULATORY_CARE_PROVIDER_SITE_OTHER): Payer: Medicaid Other | Admitting: General Surgery

## 2013-01-17 VITALS — BP 134/86 | HR 74 | Temp 97.0°F | Resp 18

## 2013-01-17 DIAGNOSIS — Z5189 Encounter for other specified aftercare: Secondary | ICD-10-CM

## 2013-01-17 DIAGNOSIS — Z4889 Encounter for other specified surgical aftercare: Secondary | ICD-10-CM

## 2013-01-17 MED ORDER — OXYCODONE-ACETAMINOPHEN 5-325 MG PO TABS: 1.0000 | ORAL_TABLET | Freq: Four times a day (QID) | ORAL | Status: DC | PRN

## 2013-01-17 NOTE — Progress Notes (Signed)
Subjective:     Patient ID: Brittany [name], female   DOB: [birth year], 32 y.o.   MRN: [id number]  HPI This patient follows up 2 weeks status post laparoscopic umbilical hernia repair with mesh.  She has been stuffing her umbilicus with gauze daily. She says that she has some continued discomfort around the periphery of the mesh mainly in the left lower quadrant but she is not having any more nausea or vomiting. She says that her bowels are functioning she does not have a great appetite. She's not having any drainage from her umbilicus.  Denies any fevers or chills. She says that her sugars have been well controlled on the lantus  Review of Systems     Objective:   Physical Exam No acute distress and nontoxic She is sitting in a chair and her abdomen is examined. Her incisions are healing fine without any sign of infection. She does have some scabbing at her umbilicus but the skin actually appears viable.  I actually asked Dr. Carolynne Edouard to look at this as well and he agreed that the skin of the umbilicus looked viable.  No evidence of recurrence    Assessment:     Status post laparoscopic ventral hernia repair with mesh I went head and refilled her Percocet today. I think that her she is doing okay and should continue to improve daily. I do not think that she needs any debridement of skin at this time. Her umbilicus looks viable although she may have some sloughing of skin. There is no evidence of any infection a recurrence and I would not recommend any further surgery at this time. We will gradually increase activities as tolerated is will see her back in a 2 weeks for repeat evaluation     Plan:     Follow up in 2 weeks  for repeat evaluation

## 2013-02-06 ENCOUNTER — Encounter (INDEPENDENT_AMBULATORY_CARE_PROVIDER_SITE_OTHER): Payer: Medicaid Other | Admitting: General Surgery

## 2013-02-14 ENCOUNTER — Encounter (INDEPENDENT_AMBULATORY_CARE_PROVIDER_SITE_OTHER): Payer: Medicaid Other | Admitting: General Surgery

## 2013-02-20 ENCOUNTER — Encounter (INDEPENDENT_AMBULATORY_CARE_PROVIDER_SITE_OTHER): Payer: Self-pay | Admitting: General Surgery

## 2013-02-22 ENCOUNTER — Encounter (HOSPITAL_COMMUNITY): Payer: Self-pay

## 2013-02-22 ENCOUNTER — Inpatient Hospital Stay (HOSPITAL_COMMUNITY)
Admission: EM | Admit: 2013-02-22 | Discharge: 2013-02-23 | DRG: 392 | Disposition: A | Discharge status: Home / Self Care | Payer: Medicaid Other | Attending: Surgery | Admitting: Surgery

## 2013-02-22 ENCOUNTER — Inpatient Hospital Stay (HOSPITAL_COMMUNITY): Payer: Medicaid Other

## 2013-02-22 DIAGNOSIS — F329 Major depressive disorder, single episode, unspecified: Secondary | ICD-10-CM | POA: Diagnosis present

## 2013-02-22 DIAGNOSIS — E119 Type 2 diabetes mellitus without complications: Secondary | ICD-10-CM | POA: Diagnosis present

## 2013-02-22 DIAGNOSIS — G4730 Sleep apnea, unspecified: Secondary | ICD-10-CM | POA: Diagnosis present

## 2013-02-22 DIAGNOSIS — Z79899 Other long term (current) drug therapy: Secondary | ICD-10-CM

## 2013-02-22 DIAGNOSIS — R1031 Right lower quadrant pain: Principal | ICD-10-CM | POA: Diagnosis present

## 2013-02-22 DIAGNOSIS — R109 Unspecified abdominal pain: Secondary | ICD-10-CM

## 2013-02-22 DIAGNOSIS — F3289 Other specified depressive episodes: Secondary | ICD-10-CM | POA: Diagnosis present

## 2013-02-22 DIAGNOSIS — Z794 Long term (current) use of insulin: Secondary | ICD-10-CM

## 2013-02-22 HISTORY — DX: Major depressive disorder, single episode, unspecified: F32.9

## 2013-02-22 HISTORY — DX: Depression, unspecified: F32.A

## 2013-02-22 LAB — CBC WITH DIFFERENTIAL/PLATELET
Basophils Absolute: 0 10*3/uL (ref 0.0–0.1)
Eosinophils Relative: 2 % (ref 0–5)
HCT: 39.1 % (ref 36.0–46.0)
Lymphocytes Relative: 30 % (ref 12–46)
Lymphs Abs: 2.3 10*3/uL (ref 0.7–4.0)
MCV: 87.9 fL (ref 78.0–100.0)
Neutro Abs: 4.9 10*3/uL (ref 1.7–7.7)
Platelets: 240 10*3/uL (ref 150–400)
RBC: 4.45 MIL/uL (ref 3.87–5.11)
RDW: 13.8 % (ref 11.5–15.5)
WBC: 7.7 10*3/uL (ref 4.0–10.5)

## 2013-02-22 LAB — URINALYSIS, ROUTINE W REFLEX MICROSCOPIC
Glucose, UA: NEGATIVE mg/dL
Protein, ur: NEGATIVE mg/dL
Specific Gravity, Urine: 1.015 (ref 1.005–1.030)
Urobilinogen, UA: 0.2 mg/dL (ref 0.0–1.0)

## 2013-02-22 LAB — COMPREHENSIVE METABOLIC PANEL
ALT: 29 U/L (ref 0–35)
AST: 23 U/L (ref 0–37)
Alkaline Phosphatase: 109 U/L (ref 39–117)
CO2: 25 mEq/L (ref 19–32)
Chloride: 101 mEq/L (ref 96–112)
GFR calc Af Amer: >90 mL/min (ref >90)
GFR calc non Af Amer: >90 mL/min (ref >90)
Glucose, Bld: 196 mg/dL — ABNORMAL HIGH (ref 70–99)
Sodium: 137 mEq/L (ref 135–145)
Total Bilirubin: 0.4 mg/dL (ref 0.3–1.2)

## 2013-02-22 LAB — GLUCOSE, CAPILLARY
Glucose-Capillary: 162 mg/dL — ABNORMAL HIGH (ref 70–99)
Glucose-Capillary: 151 mg/dL — ABNORMAL HIGH (ref 70–99)

## 2013-02-22 LAB — URINE MICROSCOPIC-ADD ON

## 2013-02-22 MED ORDER — INSULIN ASPART 100 UNIT/ML ~~LOC~~ SOLN
0.0000 [IU] | SUBCUTANEOUS | Status: DC
  Administered 2013-02-22: 4 [IU] via SUBCUTANEOUS

## 2013-02-22 MED ORDER — HYDROMORPHONE HCL PF 1 MG/ML IJ SOLN
1.0000 mg | Freq: Once | INTRAMUSCULAR | Status: AC
  Administered 2013-02-22: 1 mg via INTRAVENOUS
  Filled 2013-02-22: qty 1

## 2013-02-22 MED ORDER — INSULIN ASPART 100 UNIT/ML ~~LOC~~ SOLN
0.0000 [IU] | SUBCUTANEOUS | Status: DC
  Administered 2013-02-22 – 2013-02-23 (×2): 3 [IU] via SUBCUTANEOUS
  Administered 2013-02-23: 4 [IU] via SUBCUTANEOUS
  Administered 2013-02-23 (×2): 3 [IU] via SUBCUTANEOUS

## 2013-02-22 MED ORDER — SODIUM CHLORIDE 0.9 % IV SOLN
1000.0000 mL | INTRAVENOUS | Status: DC
  Administered 2013-02-22: 1000 mL via INTRAVENOUS

## 2013-02-22 MED ORDER — INSULIN ASPART 100 UNIT/ML ~~LOC~~ SOLN: 0.0000 [IU] | Freq: Every day | SUBCUTANEOUS | Status: DC

## 2013-02-22 MED ORDER — ENOXAPARIN SODIUM 40 MG/0.4ML ~~LOC~~ SOLN
40.0000 mg | SUBCUTANEOUS | Status: DC
  Administered 2013-02-22 – 2013-02-23 (×2): 40 mg via SUBCUTANEOUS
  Filled 2013-02-22 (×2): qty 0.4

## 2013-02-22 MED ORDER — SODIUM CHLORIDE 0.9 % IV SOLN
1000.0000 mL | Freq: Once | INTRAVENOUS | Status: AC
  Administered 2013-02-22: 1000 mL via INTRAVENOUS

## 2013-02-22 MED ORDER — ONDANSETRON HCL 4 MG/2ML IJ SOLN
4.0000 mg | Freq: Four times a day (QID) | INTRAMUSCULAR | Status: DC | PRN
  Administered 2013-02-22: 4 mg via INTRAVENOUS
  Filled 2013-02-22: qty 2

## 2013-02-22 MED ORDER — INSULIN ASPART 100 UNIT/ML ~~LOC~~ SOLN
0.0000 [IU] | Freq: Three times a day (TID) | SUBCUTANEOUS | Status: DC
  Administered 2013-02-22: 4 [IU] via SUBCUTANEOUS

## 2013-02-22 MED ORDER — ACETAMINOPHEN 325 MG PO TABS: 650.0000 mg | ORAL_TABLET | Freq: Four times a day (QID) | ORAL | Status: DC | PRN

## 2013-02-22 MED ORDER — DEXTROSE 50 % IV SOLN: 25.0000 mL | Freq: Once | INTRAVENOUS | Status: AC | PRN

## 2013-02-22 MED ORDER — ONDANSETRON HCL 4 MG/2ML IJ SOLN
4.0000 mg | Freq: Once | INTRAMUSCULAR | Status: AC
  Administered 2013-02-22: 4 mg via INTRAVENOUS
  Filled 2013-02-22: qty 2

## 2013-02-22 MED ORDER — ACETAMINOPHEN 650 MG RE SUPP: 650.0000 mg | Freq: Four times a day (QID) | RECTAL | Status: DC | PRN

## 2013-02-22 MED ORDER — HYDROMORPHONE HCL PF 1 MG/ML IJ SOLN: 1.0000 mg | INTRAMUSCULAR | Status: DC | PRN

## 2013-02-22 MED ORDER — KCL IN DEXTROSE-NACL 20-5-0.45 MEQ/L-%-% IV SOLN
INTRAVENOUS | Status: DC
  Administered 2013-02-22: 75 mL/h via INTRAVENOUS
  Administered 2013-02-22 – 2013-02-23 (×3): via INTRAVENOUS
  Filled 2013-02-22 (×3): qty 1000

## 2013-02-22 MED ORDER — DEXTROSE 50 % IV SOLN: 50.0000 mL | Freq: Once | INTRAVENOUS | Status: AC | PRN

## 2013-02-22 NOTE — Progress Notes (Signed)
Patient's husband came to nurses' station to report that patient stated that she will make herself sick  And request for nausea medication if she doesn't get something to eat.  CCS,M notified.

## 2013-02-22 NOTE — ED Notes (Signed)
Pt c/o pain in right lower abd, radiates up into right flank area.

## 2013-02-22 NOTE — ED Notes (Signed)
Bed: ZO10 Expected date:  Expected time:  Means of arrival:  Comments: Hold for Pt from [facility], [name], Abd pain, surgey on 6/25 Lap Hernia repair w/  Mesh insertion, 545lbs

## 2013-02-22 NOTE — ED Notes (Signed)
Pt reports right side lower abdominal pain that radiates up to the right flank.

## 2013-02-22 NOTE — ED Provider Notes (Signed)
CSN: [id number]     Arrival date & time 02/22/13  0140 History     None    Chief Complaint  Patient presents with  . Abdominal Pain  . Nausea  . Diarrhea   (Consider location/radiation/quality/duration/timing/severity/associated sxs/prior Treatment) Patient is a 32 y.o. female presenting with abdominal pain and diarrhea. The history is provided by the patient.  Abdominal Pain Associated symptoms: diarrhea   Diarrhea Associated symptoms: abdominal pain   She is 2 months status post laparoscopic repair of abdominal wall hernias with mesh and she has been having mild, chronic periumbilical pain since then. In 2 days ago, she started having nausea and diarrhea. She has not vomited. Yesterday, she started having pain in the right suprapubic area which radiated up toward the right flank and right subcostal area. Pain is worse with movement and worse if she takes a deep breath. She rates pain at 6/10 while laying still but the 8/10 if she moves. She denies fever, chills, sweats. She denies urinary urgency, frequency, and tenesmus, dysuria. Pain is not affected by bowel movement except for pain getting worse with movement as she gets to the bathroom.  Past Medical History  Diagnosis Date  . Diabetes mellitus without complication   . Lymphedema   . Sleep apnea     DX with sleep apnea - unable to use c-pap  . Umbilical hernia    Past Surgical History  Procedure Laterality Date  . Cesarean section  11/09/01  . Umbilical hernia repair N/A 01/02/2013    Procedure: LAPAROSCOPIC UMBILICAL HERNIA;  Surgeon: Lodema Pilot, DO;  Location: [facility];  Service: General;  Laterality: N/A;  . Insertion of mesh N/A 01/02/2013    Procedure: INSERTION OF MESH;  Surgeon: Lodema Pilot, DO;  Location: [facility];  Service: General;  Laterality: N/A;   Family History  Problem Relation Age of Onset  . Cancer Mother     leukemia   History  Substance Use Topics  . Smoking status: Never Smoker   . Smokeless tobacco:  Not on file  . Alcohol Use: No   OB History   Grav Para Term Preterm Abortions TAB SAB Ect Mult Living                 Review of Systems  Gastrointestinal: Positive for abdominal pain and diarrhea.  All other systems reviewed and are negative.    Allergies  Latex and Sulfa antibiotics  Home Medications   Current Outpatient Rx  Name  Route  Sig  Dispense  Refill  . Insulin Glargine (LANTUS SOLOSTAR) 100 UNIT/ML SOPN   Subcutaneous   Inject 15 Units into the skin at bedtime.   1 pen   2   . enoxaparin (LOVENOX) 40 MG/0.4ML injection   Subcutaneous   Inject 0.4 mLs (40 mg total) into the skin daily.   14 Syringe   0   . metFORMIN (GLUCOPHAGE) 1000 MG tablet   Oral   Take 1 tablet (1,000 mg total) by mouth 2 (two) times daily with a meal.   30 tablet   0   . ondansetron (ZOFRAN ODT) 4 MG disintegrating tablet   Oral   Take 1 tablet (4 mg total) by mouth every 8 (eight) hours as needed for nausea.   20 tablet   0   . ondansetron (ZOFRAN) 4 MG tablet   Oral   Take 1 tablet (4 mg total) by mouth every 6 (six) hours as needed for nausea.   20 tablet  0   . oxyCODONE-acetaminophen (PERCOCET/ROXICET) 5-325 MG per tablet   Oral   Take 1 tablet by mouth every 6 (six) hours as needed for pain.   45 tablet   0    BP 147/89  Pulse 89  Temp(Src) 98 F (36.7 C) (Oral)  Resp 20  Ht 5\' 11"  (1.803 m)  Wt 564 lb (255.829 kg)  BMI 78.7 kg/m2  SpO2 98% Physical Exam  Nursing note and vitals reviewed.  Morbidly obese 32 year old female, who appears uncomfortable, but his in no acute distress. Vital signs are significant for hypertension with blood pressure 147/89. Oxygen saturation is 98%, which is normal. Head is normocephalic and atraumatic. PERRLA, EOMI. Oropharynx is clear. Neck is nontender and supple without adenopathy or JVD. Back is nontender and there is no CVA tenderness. Lungs are clear without rales, wheezes, or rhonchi. Chest is nontender. Heart has  regular rate and rhythm without murmur. Abdomen is obese, soft, with diffuse tenderness. Tenderness is worse on the right side than the left side. There is no rebound or guarding. Umbilicus is discolored but patient states that is chronic since her surgery. There are no masses or hepatosplenomegaly and peristalsis is hypoactive. Extremities have no cyanosis or edema, full range of motion is present. Skin is warm and dry without rash. Neurologic: Mental status is normal, cranial nerves are intact, there are no motor or sensory deficits.  ED Course   Procedures (including critical care time)  Results for orders placed during the hospital encounter of 02/22/13  URINALYSIS, ROUTINE W REFLEX MICROSCOPIC      Result Value Range   Color, Urine YELLOW  YELLOW   APPearance CLEAR  CLEAR   Specific Gravity, Urine 1.015  1.005 - 1.030   pH 6.0  5.0 - 8.0   Glucose, UA NEGATIVE  NEGATIVE mg/dL   Hgb urine dipstick TRACE (*) NEGATIVE   Bilirubin Urine NEGATIVE  NEGATIVE   Ketones, ur NEGATIVE  NEGATIVE mg/dL   Protein, ur NEGATIVE  NEGATIVE mg/dL   Urobilinogen, UA 0.2  0.0 - 1.0 mg/dL   Nitrite NEGATIVE  NEGATIVE   Leukocytes, UA TRACE (*) NEGATIVE  CBC WITH DIFFERENTIAL      Result Value Range   WBC 7.7  4.0 - 10.5 K/uL   RBC 4.45  3.87 - 5.11 MIL/uL   Hemoglobin 12.7  12.0 - 15.0 g/dL   HCT 62.9  52.8 - 41.3 %   MCV 87.9  78.0 - 100.0 fL   MCH 28.5  26.0 - 34.0 pg   MCHC 32.5  30.0 - 36.0 g/dL   RDW 24.4  01.0 - 27.2 %   Platelets 240  150 - 400 K/uL   Neutrophils Relative % 63  43 - 77 %   Neutro Abs 4.9  1.7 - 7.7 K/uL   Lymphocytes Relative 30  12 - 46 %   Lymphs Abs 2.3  0.7 - 4.0 K/uL   Monocytes Relative 5  3 - 12 %   Monocytes Absolute 0.4  0.1 - 1.0 K/uL   Eosinophils Relative 2  0 - 5 %   Eosinophils Absolute 0.2  0.0 - 0.7 K/uL   Basophils Relative 0  0 - 1 %   Basophils Absolute 0.0  0.0 - 0.1 K/uL  COMPREHENSIVE METABOLIC PANEL      Result Value Range   Sodium 137   135 - 145 mEq/L   Potassium 4.0  3.5 - 5.1 mEq/L   Chloride 101  96 - 112 mEq/L   CO2 25  19 - 32 mEq/L   Glucose, Bld 196 (*) 70 - 99 mg/dL   BUN 11  6 - 23 mg/dL   Creatinine, Ser 1.61  0.50 - 1.10 mg/dL   Calcium 9.4  8.4 - 09.6 mg/dL   Total Protein 7.6  6.0 - 8.3 g/dL   Albumin 3.6  3.5 - 5.2 g/dL   AST 23  0 - 37 U/L   ALT 29  0 - 35 U/L   Alkaline Phosphatase 109  39 - 117 U/L   Total Bilirubin 0.4  0.3 - 1.2 mg/dL   GFR calc non Af Amer >90  >90 mL/min   GFR calc Af Amer >90  >90 mL/min  LIPASE, BLOOD      Result Value Range   Lipase 31  11 - 59 U/L  URINE MICROSCOPIC-ADD ON      Result Value Range   Squamous Epithelial / LPF MANY (*) RARE   WBC, UA 3-6  <3 WBC/hpf   Bacteria, UA MANY (*) RARE  POCT PREGNANCY, URINE      Result Value Range   Preg Test, Ur NEGATIVE  NEGATIVE    1. Abdominal pain     MDM  Abdominal pain of uncertain cause. This may be related to her recent surgery but also consider possibility of biliary tract disease or appendicitis. She's been given IV fluids and IV ondansetron with improvement of nausea. She'll be given IV hydromorphone for pain. Because of her morbid obesity, and CT scan cannot be obtained. I will discuss her case with her surgeon in [place] and consider transfer for her to be evaluated there. Case is discussed with Dr. Donell Beers of [facility] who agrees to accept the patient in transfer to [facility] emergency department. Case is also discussed with Dr. Read Drivers in the [facility] emergency department.  Dione Booze, MD 02/22/13 [phone]

## 2013-02-22 NOTE — ED Provider Notes (Signed)
[id number] - Patient arrived from [facility] - evaluated by Dr. Preston Fleeting there for abdominal pain. Recent laparoscopic umbilical hernia repair. Accepted by Surgery. Morbid obesity did not allow patient to get CT scan at [facility]. Patient doing well, vitals stable. Does not want anything for pain at this time. Surgery admitting.   Dagmar Hait, MD 02/22/13 [phone]

## 2013-02-22 NOTE — Progress Notes (Signed)
Patient c/o hunger and request food. [place] NP return call with no change in po intake status. T.O.V. To change cbgs to every 4 hours with SSI. Patient made aware.

## 2013-02-22 NOTE — ED Notes (Signed)
Report given to [facility] 

## 2013-02-22 NOTE — H&P (Signed)
Chief Complaint: RLQ abdominal pain HPI: Brittany [name] is a 32 year old female with a history of diabetes mellitus, morbid obesity, untreated sleep apnea and umbilical hernia repair with mesh in June 2014 who was transferred to [facility] due to abdominal pain, nausea and diarrhea.  Duration of symptoms is 2 days.  Location of pain is RLQ with radiation towards RUQ.  Moderate in severity.  Aggravated with movement.  Improves with lying perfectly still.  Denies fever, chills or sweats.  Denies urinary symptoms.  No modifying factors. Denies periumbilical pain.    Past Medical History  Diagnosis Date  . Diabetes mellitus without complication   . Lymphedema   . Sleep apnea     DX with sleep apnea - unable to use c-pap  . Umbilical hernia   . Depression     Past Surgical History  Procedure Laterality Date  . Cesarean section  11/09/01  . Umbilical hernia repair N/A 01/02/2013    Procedure: LAPAROSCOPIC UMBILICAL HERNIA;  Surgeon: Lodema Pilot, DO;  Location: [facility];  Service: General;  Laterality: N/A;  . Insertion of mesh N/A 01/02/2013    Procedure: INSERTION OF MESH;  Surgeon: Lodema Pilot, DO;  Location: [facility];  Service: General;  Laterality: N/A;    Family History  Problem Relation Age of Onset  . Cancer Mother     leukemia  . Diabetes Mother   . Hypertension Mother   . Hyperlipidemia Mother    Social History:  reports that she has never smoked. She does not have any smokeless tobacco history on file. She reports that she does not drink alcohol or use illicit drugs.  Allergies:  Allergies  Allergen Reactions  . Latex Itching, Swelling and Rash  . Sulfa Antibiotics Nausea And Vomiting and Rash    Medications Prior to Admission  Medication Sig Dispense Refill  . Chromium-Cinnamon (CINNAMON PLUS CHROMIUM PO) Take 2 tablets by mouth 2 (two) times daily.      . Insulin Glargine (LANTUS SOLOSTAR) 100 UNIT/ML SOPN Inject 15 Units into the skin at bedtime.  1 pen  2    Results  for orders placed during the hospital encounter of 02/22/13 (from the past 48 hour(s))  CBC WITH DIFFERENTIAL     Status: None   Collection Time    02/22/13  2:48 AM      Result Value Range   WBC 7.7  4.0 - 10.5 K/uL   RBC 4.45  3.87 - 5.11 MIL/uL   Hemoglobin 12.7  12.0 - 15.0 g/dL   HCT 78.2  95.6 - 21.3 %   MCV 87.9  78.0 - 100.0 fL   MCH 28.5  26.0 - 34.0 pg   MCHC 32.5  30.0 - 36.0 g/dL   RDW 08.6  57.8 - 46.9 %   Platelets 240  150 - 400 K/uL   Neutrophils Relative % 63  43 - 77 %   Neutro Abs 4.9  1.7 - 7.7 K/uL   Lymphocytes Relative 30  12 - 46 %   Lymphs Abs 2.3  0.7 - 4.0 K/uL   Monocytes Relative 5  3 - 12 %   Monocytes Absolute 0.4  0.1 - 1.0 K/uL   Eosinophils Relative 2  0 - 5 %   Eosinophils Absolute 0.2  0.0 - 0.7 K/uL   Basophils Relative 0  0 - 1 %   Basophils Absolute 0.0  0.0 - 0.1 K/uL  COMPREHENSIVE METABOLIC PANEL     Status:  Abnormal   Collection Time    02/22/13  2:48 AM      Result Value Range   Sodium 137  135 - 145 mEq/L   Potassium 4.0  3.5 - 5.1 mEq/L   Chloride 101  96 - 112 mEq/L   CO2 25  19 - 32 mEq/L   Glucose, Bld 196 (*) 70 - 99 mg/dL   BUN 11  6 - 23 mg/dL   Creatinine, Ser 6.57  0.50 - 1.10 mg/dL   Calcium 9.4  8.4 - 84.6 mg/dL   Total Protein 7.6  6.0 - 8.3 g/dL   Albumin 3.6  3.5 - 5.2 g/dL   AST 23  0 - 37 U/L   ALT 29  0 - 35 U/L   Alkaline Phosphatase 109  39 - 117 U/L   Total Bilirubin 0.4  0.3 - 1.2 mg/dL   GFR calc non Af Amer >90  >90 mL/min   GFR calc Af Amer >90  >90 mL/min   Comment: (NOTE)     The eGFR has been calculated using the CKD EPI equation.     This calculation has not been validated in all clinical situations.     eGFR's persistently <90 mL/min signify possible Chronic Kidney     Disease.  LIPASE, BLOOD     Status: None   Collection Time    02/22/13  2:48 AM      Result Value Range   Lipase 31  11 - 59 U/L  URINALYSIS, ROUTINE W REFLEX MICROSCOPIC     Status: Abnormal   Collection Time    02/22/13   4:14 AM      Result Value Range   Color, Urine YELLOW  YELLOW   APPearance CLEAR  CLEAR   Specific Gravity, Urine 1.015  1.005 - 1.030   pH 6.0  5.0 - 8.0   Glucose, UA NEGATIVE  NEGATIVE mg/dL   Hgb urine dipstick TRACE (*) NEGATIVE   Bilirubin Urine NEGATIVE  NEGATIVE   Ketones, ur NEGATIVE  NEGATIVE mg/dL   Protein, ur NEGATIVE  NEGATIVE mg/dL   Urobilinogen, UA 0.2  0.0 - 1.0 mg/dL   Nitrite NEGATIVE  NEGATIVE   Leukocytes, UA TRACE (*) NEGATIVE  URINE MICROSCOPIC-ADD ON     Status: Abnormal   Collection Time    02/22/13  4:14 AM      Result Value Range   Squamous Epithelial / LPF MANY (*) RARE   WBC, UA 3-6  <3 WBC/hpf   Bacteria, UA MANY (*) RARE  POCT PREGNANCY, URINE     Status: None   Collection Time    02/22/13  4:18 AM      Result Value Range   Preg Test, Ur NEGATIVE  NEGATIVE   Comment:            THE SENSITIVITY OF THIS     METHODOLOGY IS >24 mIU/mL   No results found.  Review of Systems  Constitutional: Negative for fever, chills and malaise/fatigue.  Respiratory: Negative for sputum production and wheezing.   Cardiovascular: Negative for chest pain and palpitations.  Gastrointestinal: Positive for nausea, abdominal pain and diarrhea. Negative for vomiting, constipation, blood in stool and melena.  Genitourinary: Negative for dysuria and hematuria.  Neurological: Negative for dizziness, tingling, tremors, seizures, loss of consciousness, weakness and headaches.  Psychiatric/Behavioral: Negative for depression.    Blood pressure 130/75, pulse 89, temperature 98.2 F (36.8 C), temperature source Oral, resp. rate 20, height 5\' 11"  (1.803  m), weight 564 lb (255.829 kg), SpO2 99.00%. Physical Exam  Constitutional: She is oriented to person, place, and time. She appears well-developed and well-nourished. No distress.  HENT:  Head: Normocephalic and atraumatic.  Mouth/Throat: No oropharyngeal exudate.  Neck: Normal range of motion. Neck supple. No thyromegaly  present.  Cardiovascular: Normal rate, regular rhythm, normal heart sounds and intact distal pulses.  Exam reveals no gallop and no friction rub.   No murmur heard. Respiratory: Effort normal and breath sounds normal. No respiratory distress. She has no wheezes. She has no rales. She exhibits no tenderness.  GI: Soft. Bowel sounds are normal. She exhibits no distension and no mass. There is no guarding.  Umbilicus is pink and without eschar.  TTP RLQ without evidence of peritonitis.    Musculoskeletal: She exhibits no tenderness.  Lymphedema   Lymphadenopathy:    She has no cervical adenopathy.  Neurological: She is alert and oriented to person, place, and time.  Skin: Skin is dry. She is not diaphoretic. No erythema.  Psychiatric: She has a normal mood and affect. Her behavior is normal. Judgment and thought content normal.     Assessment/Plan RLQ abdominal pain: etiology in unclear, consider early appendicitis, enteritis, ovarian cyst or vaginitis.  Her white count is normal this morning.  We are unable to obtain a CT scan due to body habitus. Proceed with a pelvic ultrasound to rule out ovarian cyst or any inflammation that may suggest GYN infection.  Consider STD screen and stool cultures due to recent onset of diarrhea. For now, we will admit her and monitor her overnight.  Repeat labs in the morning.  If her pain worsens or there is certain evidence of appendicitis will proceed with surgical intervention.  Will hold off on antibiotic therapy at this time.  Start IV fluids, pain control, antiemetics.    Diabetes mellitus -hold lantus -QID CBGs and sliding scale insulin  Sleep apnea -untreated, declined CPAP -may use oxygen prn for desaturation  VTE prophylaxis -lovenox, SCDs, mobilize   [place], [facility] ANP-BC Pager [phone]  02/22/2013, 11:09 AM   She has been doing okay since her surgery.  Now with focal RLQ pain but no peritonitis.  Too large for CT scanner.  Keep overnight  for repeat abdominal exam to evaluate for possible appendicitis.

## 2013-02-22 NOTE — ED Notes (Signed)
Pt ambulated to restroom & returned to room w/ no complications. 

## 2013-02-23 LAB — GLUCOSE, CAPILLARY: Glucose-Capillary: 130 mg/dL — ABNORMAL HIGH (ref 70–99)

## 2013-02-23 LAB — URINE CULTURE: Colony Count: 40000

## 2013-02-23 LAB — BASIC METABOLIC PANEL
BUN: 6 mg/dL (ref 6–23)
CO2: 24 mEq/L (ref 19–32)
Calcium: 9.3 mg/dL (ref 8.4–10.5)
Creatinine, Ser: 0.71 mg/dL (ref 0.50–1.10)
GFR calc non Af Amer: >90 mL/min (ref >90)
Glucose, Bld: 131 mg/dL — ABNORMAL HIGH (ref 70–99)
Sodium: 138 mEq/L (ref 135–145)

## 2013-02-23 LAB — CBC
MCH: 28 pg (ref 26.0–34.0)
MCHC: 32.1 g/dL (ref 30.0–36.0)
MCV: 87.3 fL (ref 78.0–100.0)
Platelets: 199 10*3/uL (ref 150–400)
RBC: 4.03 MIL/uL (ref 3.87–5.11)

## 2013-02-23 NOTE — Progress Notes (Signed)
Patient ID: Brittany [name], female   DOB: 1981/05/26, 32 y.o.   MRN: [id number]  General Surgery - [facility] - Progress Note  HD# 2  Subjective: Patient feels "much better" today.  Abdominal pain improved, "just sore all over".  No nausea.  Wants to eat.  Objective: Vital signs in last 24 hours: Temp:  [97.7 F (36.5 C)-98.2 F (36.8 C)] 98.2 F (36.8 C) (08/16 0545) Pulse Rate:  [58-88] 79 (08/16 0545) Resp:  [16] 16 (08/16 0545) BP: (99-131)/(66-86) 122/86 mmHg (08/16 0545) SpO2:  [97 %-98 %] 98 % (08/16 0545) Last BM Date: 02/22/13  Intake/Output from previous day: 08/15 0701 - 08/16 0700 In: 1527.5 [I.V.:1527.5] Out: 60 [Emesis/NG output:60]  Exam: HEENT - clear, not icteric Neck - soft Chest - clear bilaterally Cor - RRR, no murmur Abd - massively obese, no significant tenderness, no RLQ tenderness, no mass Ext - no significant edema Neuro - grossly intact, no focal deficits  Lab Results:   Recent Labs  02/22/13 0248 02/23/13 0433  WBC 7.7 7.3  HGB 12.7 11.3*  HCT 39.1 35.2*  PLT 240 199     Recent Labs  02/22/13 0248 02/23/13 0433  NA 137 138  K 4.0 3.5  CL 101 104  CO2 25 24  GLUCOSE 196* 131*  BUN 11 6  CREATININE 0.75 0.71  CALCIUM 9.4 9.3    Studies/Results: US Transvaginal Non-ob  02/22/2013   *RADIOLOGY REPORT*  Clinical Data: Pelvic pain.  TRANSABDOMINAL AND TRANSVAGINAL ULTRASOUND OF PELVIS Technique:  Both transabdominal and transvaginal ultrasound examinations of the pelvis were performed. Transabdominal technique was performed for global imaging of the pelvis including uterus, ovaries, adnexal regions, and pelvic cul-de-sac.  It was necessary to proceed with endovaginal exam following the transabdominal exam to visualize the ovaries and endometrium.  Comparison:  None  Findings:  Uterus: Measures 8.3 x 2.8 x 3.4 cm.  No myometrial abnormalities are demonstrated.  Endometrium: Measures 5.5 mm.  An IUD is noted in the  endometrial canal.  There is also a small amount of fluid in the endocervical canal.  Right ovary:  Unable to visualize.  No adnexal mass.  Left ovary: Unable to visualize.  No adnexal mass.  Other findings: No free fluid  IMPRESSION:  1.  Normal sonographic appearance of the uterus. 2.  IUD noted in the endometrial canal. 3.  Non-visualization of both ovaries.  Exam limited by body habitus.  No obvious adnexal masses.   Original Report Authenticated By: Rudie Meyer, M.D.   US Pelvis Complete  02/22/2013   *RADIOLOGY REPORT*  Clinical Data: Pelvic pain.  TRANSABDOMINAL AND TRANSVAGINAL ULTRASOUND OF PELVIS Technique:  Both transabdominal and transvaginal ultrasound examinations of the pelvis were performed. Transabdominal technique was performed for global imaging of the pelvis including uterus, ovaries, adnexal regions, and pelvic cul-de-sac.  It was necessary to proceed with endovaginal exam following the transabdominal exam to visualize the ovaries and endometrium.  Comparison:  None  Findings:  Uterus: Measures 8.3 x 2.8 x 3.4 cm.  No myometrial abnormalities are demonstrated.  Endometrium: Measures 5.5 mm.  An IUD is noted in the endometrial canal.  There is also a small amount of fluid in the endocervical canal.  Right ovary:  Unable to visualize.  No adnexal mass.  Left ovary: Unable to visualize.  No adnexal mass.  Other findings: No free fluid  IMPRESSION:  1.  Normal sonographic appearance of the uterus. 2.  IUD noted in the endometrial  canal. 3.  Non-visualization of both ovaries.  Exam limited by body habitus.  No obvious adnexal masses.   Original Report Authenticated By: Rudie Meyer, M.D.    Assessment / Plan: 1.  Abdominal pain - resolving  No evidence of acute appendicitis  WBC normal, afebrile, hungry  Will resume diet and if tolerated discharge home this afternoon  Velora Heckler, MD, [facility] Office: [phone]  02/23/2013

## 2013-02-26 NOTE — Discharge Summary (Signed)
Physician Discharge Summary  Brittany [name] JWJ:[id number] DOB: Mar 21, 1981 DOA: 02/22/2013  PCP: Orbie Hurst, NP  Consultation: none  Admit date: 02/22/2013 Discharge date: 02/23/2013  Recommendations for Outpatient Follow-up:   Follow-up Information   Follow up with LAYTON, BRIAN DAVID, DO. Schedule an appointment as soon as possible for a visit in 2 weeks.   Specialty:  General Surgery   Contact information:   [address] [place] [address] [phone]      Discharge Diagnoses:  RLQ abdominal pain Diabetes mellitus Sleep Apnea Obesity   Surgical Procedure: none  Discharge Condition: stable Disposition: home  Diet recommendation: Carb modified   Filed Weights   02/22/13 0226  Weight: 564 lb (255.829 kg)    Hospital Course:  Brittany [name] is a 32 year old female with a history of diabetes mellitus, morbid obesity, untreated sleep apnea and umbilical hernia repair with mesh in June 2014 who was transferred to [facility] due to RLQ abdominal pain, nausea and diarrhea.   Etiology of symptoms was unclear, consider early appendicitis, enteritis, ovarian cyst or vaginitis. Her white count was normal upon ED arrival. We are unable to obtain a CT scan due to body habitus. She had an unremarkable pelvic ultrasound.  She was admitted for observation, but her symptoms were not a result of recent hernia repair.  The following day, she felt better.  VS remained stable.  Labs remained stable.  She was therefore felt stable for discharge.    Discharge Instructions  Discharge Orders   Future Orders Complete By Expires   Diet - low sodium heart healthy  As directed    Discharge instructions  As directed    Comments:     [facility] -- DISCHARGE INSTRUCTIONS  REMINDER:   Carry a list of your medications and allergies with you at all times  Call your pharmacy at least 1 week in advance to refill prescriptions  Do not mix any prescribed  pain medicine with alcohol  Do not drive any motor vehicles while taking pain medication  Take medications with food unless otherwise directed  Follow-up appointments (date to return to physician): Please call [phone] to confirm your follow up appointment with your surgeon.  Call your Surgeon if you have:  Temperature greater than 101.0  Persistent nausea and vomiting  Severe uncontrolled pain  Redness, tenderness, or signs of infection (pain, swelling, redness, odor or    green/yellow discharge around the site)  Difficulty breathing, headache or visual disturbances  Hives  Persistent dizziness or light-headedness  Any other questions or concerns you may have after discharge  In an emergency, call 911 or go to an Emergency Department at a nearby hospital.   Diet: Begin with liquids, and if they are tolerated, resume your usual diet.  Avoid spicy, greasy or heavy foods.  If you have nausea or vomiting, go back to liquids.  If you cannot keep liquids down, call your doctor.  Avoid alcohol consumption while on prescription pain medications. Good nutrition promotes healing. Increase fiber and fluids.   ADDITIONAL INSTRUCTIONS:   Velora Heckler, MD, [facility] Office: [phone]   Increase activity slowly  As directed    No wound care  As directed        Medication List         CINNAMON PLUS CHROMIUM PO  Take 2 tablets by mouth 2 (two) times daily.     Insulin Glargine 100  UNIT/ML Sopn  Commonly known as:  LANTUS SOLOSTAR  Inject 15 Units into the skin at bedtime.           Follow-up Information   Follow up with LAYTON, BRIAN DAVID, DO. Schedule an appointment as soon as possible for a visit in 2 weeks.   Specialty:  General Surgery   Contact information:   [address] [place] [address] [phone]        The results of significant diagnostics from this hospitalization (including imaging, microbiology, ancillary  and laboratory) are listed below for reference.    Significant Diagnostic Studies: US Transvaginal Non-ob  03/18/13   *RADIOLOGY REPORT*  Clinical Data: Pelvic pain.  TRANSABDOMINAL AND TRANSVAGINAL ULTRASOUND OF PELVIS Technique:  Both transabdominal and transvaginal ultrasound examinations of the pelvis were performed. Transabdominal technique was performed for global imaging of the pelvis including uterus, ovaries, adnexal regions, and pelvic cul-de-sac.  It was necessary to proceed with endovaginal exam following the transabdominal exam to visualize the ovaries and endometrium.  Comparison:  None  Findings:  Uterus: Measures 8.3 x 2.8 x 3.4 cm.  No myometrial abnormalities are demonstrated.  Endometrium: Measures 5.5 mm.  An IUD is noted in the endometrial canal.  There is also a small amount of fluid in the endocervical canal.  Right ovary:  Unable to visualize.  No adnexal mass.  Left ovary: Unable to visualize.  No adnexal mass.  Other findings: No free fluid  IMPRESSION:  1.  Normal sonographic appearance of the uterus. 2.  IUD noted in the endometrial canal. 3.  Non-visualization of both ovaries.  Exam limited by body habitus.  No obvious adnexal masses.   Original Report Authenticated By: Rudie Meyer, M.D.   US Pelvis Complete  March 18, 2013   *RADIOLOGY REPORT*  Clinical Data: Pelvic pain.  TRANSABDOMINAL AND TRANSVAGINAL ULTRASOUND OF PELVIS Technique:  Both transabdominal and transvaginal ultrasound examinations of the pelvis were performed. Transabdominal technique was performed for global imaging of the pelvis including uterus, ovaries, adnexal regions, and pelvic cul-de-sac.  It was necessary to proceed with endovaginal exam following the transabdominal exam to visualize the ovaries and endometrium.  Comparison:  None  Findings:  Uterus: Measures 8.3 x 2.8 x 3.4 cm.  No myometrial abnormalities are demonstrated.  Endometrium: Measures 5.5 mm.  An IUD is noted in the endometrial canal.  There  is also a small amount of fluid in the endocervical canal.  Right ovary:  Unable to visualize.  No adnexal mass.  Left ovary: Unable to visualize.  No adnexal mass.  Other findings: No free fluid  IMPRESSION:  1.  Normal sonographic appearance of the uterus. 2.  IUD noted in the endometrial canal. 3.  Non-visualization of both ovaries.  Exam limited by body habitus.  No obvious adnexal masses.   Original Report Authenticated By: Rudie Meyer, M.D.    Microbiology: Recent Results (from the past 240 hour(s))  URINE CULTURE     Status: None   Collection Time    03/18/2013  4:14 AM      Result Value Range Status   Specimen Description URINE, CLEAN CATCH   Final   Special Requests NONE   Final   Culture  Setup Time     Final   Value: 03/18/13 22:52     Performed at [facility] Count     Final   Value: 40,000 COLONIES/ML     Performed at [facility]  Culture     Final   Value: Multiple bacterial morphotypes present, none predominant. Suggest appropriate recollection if clinically indicated.     Performed at [facility]   Report Status 02/23/2013 FINAL   Final     Labs: Basic Metabolic Panel:  Recent Labs Lab 02/22/13 0248 02/23/13 0433  NA 137 138  K 4.0 3.5  CL 101 104  CO2 25 24  GLUCOSE 196* 131*  BUN 11 6  CREATININE 0.75 0.71  CALCIUM 9.4 9.3   Liver Function Tests:  Recent Labs Lab 02/22/13 0248  AST 23  ALT 29  ALKPHOS 109  BILITOT 0.4  PROT 7.6  ALBUMIN 3.6    Recent Labs Lab 02/22/13 0248  LIPASE 31   CBC:  Recent Labs Lab 02/22/13 0248 02/23/13 0433  WBC 7.7 7.3  NEUTROABS 4.9  --   HGB 12.7 11.3*  HCT 39.1 35.2*  MCV 87.9 87.3  PLT 240 199   CBG:  Recent Labs Lab 02/22/13 2000 02/23/13 0014 02/23/13 0406 02/23/13 0728 02/23/13 1200  GLUCAP 128* 136* 130* 136* 193*    Active Problems: RLQ abdominal pain Diabetes mellitus type 2 Obesity  Time coordinating discharge: 30  mins  Signed:  Trace Cederberg, ANP-BC

## 2013-02-26 NOTE — Discharge Summary (Signed)
General Surgery [facility]  As above.  Velora Heckler, MD, [facility] Office: [phone]

## 2013-03-14 ENCOUNTER — Encounter (INDEPENDENT_AMBULATORY_CARE_PROVIDER_SITE_OTHER): Payer: Medicaid Other | Admitting: General Surgery

## 2013-03-28 ENCOUNTER — Telehealth (INDEPENDENT_AMBULATORY_CARE_PROVIDER_SITE_OTHER): Payer: Self-pay

## 2013-03-28 NOTE — Telephone Encounter (Signed)
Called and spoke to patient to r/s appointment due to office being cxl'd per Dr. Biagio Quint.  Patient has been r/s to 04/04/13 @ 9:45 am w/Dr. Biagio Quint

## 2013-03-29 ENCOUNTER — Encounter (INDEPENDENT_AMBULATORY_CARE_PROVIDER_SITE_OTHER): Payer: Medicaid Other | Admitting: General Surgery

## 2013-04-04 ENCOUNTER — Encounter (INDEPENDENT_AMBULATORY_CARE_PROVIDER_SITE_OTHER): Payer: Medicaid Other | Admitting: General Surgery

## 2013-04-04 ENCOUNTER — Encounter (INDEPENDENT_AMBULATORY_CARE_PROVIDER_SITE_OTHER): Payer: Self-pay | Admitting: General Surgery

## 2013-05-16 ENCOUNTER — Other Ambulatory Visit: Payer: Self-pay

## 2014-04-25 ENCOUNTER — Other Ambulatory Visit: Payer: Self-pay

## 2014-10-04 ENCOUNTER — Emergency Department (HOSPITAL_COMMUNITY)
Admission: EM | Admit: 2014-10-04 | Discharge: 2014-10-04 | Disposition: A | Discharge status: Home / Self Care | Payer: Medicaid Other | Attending: Emergency Medicine | Admitting: Emergency Medicine

## 2014-10-04 ENCOUNTER — Encounter (HOSPITAL_COMMUNITY): Payer: Self-pay | Admitting: *Deleted

## 2014-10-04 ENCOUNTER — Emergency Department (HOSPITAL_COMMUNITY): Payer: Medicaid Other

## 2014-10-04 DIAGNOSIS — Z8659 Personal history of other mental and behavioral disorders: Secondary | ICD-10-CM | POA: Diagnosis not present

## 2014-10-04 DIAGNOSIS — Z79899 Other long term (current) drug therapy: Secondary | ICD-10-CM | POA: Diagnosis not present

## 2014-10-04 DIAGNOSIS — Z8719 Personal history of other diseases of the digestive system: Secondary | ICD-10-CM | POA: Insufficient documentation

## 2014-10-04 DIAGNOSIS — G4730 Sleep apnea, unspecified: Secondary | ICD-10-CM | POA: Diagnosis not present

## 2014-10-04 DIAGNOSIS — E119 Type 2 diabetes mellitus without complications: Secondary | ICD-10-CM | POA: Insufficient documentation

## 2014-10-04 DIAGNOSIS — Z91040 Latex allergy status: Secondary | ICD-10-CM | POA: Diagnosis not present

## 2014-10-04 DIAGNOSIS — J209 Acute bronchitis, unspecified: Secondary | ICD-10-CM | POA: Diagnosis not present

## 2014-10-04 DIAGNOSIS — J40 Bronchitis, not specified as acute or chronic: Secondary | ICD-10-CM

## 2014-10-04 DIAGNOSIS — Z794 Long term (current) use of insulin: Secondary | ICD-10-CM | POA: Diagnosis not present

## 2014-10-04 DIAGNOSIS — R05 Cough: Secondary | ICD-10-CM | POA: Diagnosis present

## 2014-10-04 LAB — CBG MONITORING, ED: Glucose-Capillary: 321 mg/dL — ABNORMAL HIGH (ref 70–99)

## 2014-10-04 MED ORDER — AZITHROMYCIN 250 MG PO TABS: ORAL_TABLET | ORAL | Status: DC

## 2014-10-04 MED ORDER — IPRATROPIUM-ALBUTEROL 0.5-2.5 (3) MG/3ML IN SOLN
3.0000 mL | Freq: Once | RESPIRATORY_TRACT | Status: AC
  Administered 2014-10-04: 3 mL via RESPIRATORY_TRACT
  Filled 2014-10-04: qty 3

## 2014-10-04 MED ORDER — HYDROCOD POLST-CHLORPHEN POLST 10-8 MG/5ML PO LQCR: 5.0000 mL | Freq: Two times a day (BID) | ORAL | Status: DC | PRN

## 2014-10-04 MED ORDER — ALBUTEROL SULFATE HFA 108 (90 BASE) MCG/ACT IN AERS
2.0000 | INHALATION_SPRAY | RESPIRATORY_TRACT | Status: DC | PRN
Start: 2014-10-04 — End: 2014-10-05
  Administered 2014-10-04: 2 via RESPIRATORY_TRACT

## 2014-10-04 MED ORDER — HYDROCOD POLST-CHLORPHEN POLST 10-8 MG/5ML PO LQCR
5.0000 mL | Freq: Once | ORAL | Status: AC
  Administered 2014-10-04: 5 mL via ORAL
  Filled 2014-10-04: qty 5

## 2014-10-04 MED ORDER — ALBUTEROL SULFATE (2.5 MG/3ML) 0.083% IN NEBU
2.5000 mg | INHALATION_SOLUTION | Freq: Once | RESPIRATORY_TRACT | Status: AC
  Administered 2014-10-04: 2.5 mg via RESPIRATORY_TRACT
  Filled 2014-10-04: qty 3

## 2014-10-04 NOTE — ED Notes (Signed)
Discussed need for better insulin control of blood sugar. Pt initially disinterested in this discussion and giving excuses as to why she is not in better control. ("I don't like metformin" "my doctor didn't give me any short acting insulin" "i've taken diabetic classes before"). Discussed the importance of closer glucose control and the possible complications of poor control/noncompliance. Encouraged pt. To f/u with PMD. Pt's husband in full agreement.

## 2014-10-04 NOTE — Discharge Instructions (Signed)
Follow up if not improving

## 2014-10-04 NOTE — Progress Notes (Addendum)
Patient lungs appear clear, cough is dry, diabetic not control well.

## 2014-10-04 NOTE — ED Notes (Signed)
Pt states "oh that's my normal blood sugar, it gets higher than that"

## 2014-10-04 NOTE — ED Notes (Signed)
Pt was seen at [facility] last week and was told she had a cough and was given tessalon pearls. Pt states she is still coughing. Pt states she coughed so much today, she coughed up her food.

## 2014-10-06 NOTE — ED Provider Notes (Signed)
CSN: [id number]     Arrival date & time 10/04/14  1956 History   First MD Initiated Contact with Patient 10/04/14 2015     No chief complaint on file.    (Consider location/radiation/quality/duration/timing/severity/associated sxs/prior Treatment) Patient is a 34 y.o. female presenting with cough. The history is provided by the patient (pt complains of a cough and some sob).  Cough Cough characteristics:  Non-productive Severity:  Moderate Onset quality:  Sudden Timing:  Constant Progression:  Waxing and waning Chronicity:  New Associated symptoms: wheezing   Associated symptoms: no chest pain, no eye discharge, no headaches and no rash     Past Medical History  Diagnosis Date  . Diabetes mellitus without complication   . Lymphedema   . Sleep apnea     DX with sleep apnea - unable to use c-pap  . Umbilical hernia   . Depression    Past Surgical History  Procedure Laterality Date  . Cesarean section  11/09/01  . Umbilical hernia repair N/A 01/02/2013    Procedure: LAPAROSCOPIC UMBILICAL HERNIA;  Surgeon: Lodema PilotBrian Layton, DO;  Location: [facility];  Service: General;  Laterality: N/A;  . Insertion of mesh N/A 01/02/2013    Procedure: INSERTION OF MESH;  Surgeon: Lodema PilotBrian Layton, DO;  Location: [facility];  Service: General;  Laterality: N/A;   Family History  Problem Relation Age of Onset  . Cancer Mother     leukemia  . Diabetes Mother   . Hypertension Mother   . Hyperlipidemia Mother    History  Substance Use Topics  . Smoking status: Never Smoker   . Smokeless tobacco: Not on file  . Alcohol Use: No   OB History    No data available     Review of Systems  Constitutional: Negative for appetite change and fatigue.  HENT: Negative for congestion, ear discharge and sinus pressure.   Eyes: Negative for discharge.  Respiratory: Positive for cough and wheezing.   Cardiovascular: Negative for chest pain.  Gastrointestinal: Negative for abdominal pain and diarrhea.  Genitourinary:  Negative for frequency and hematuria.  Musculoskeletal: Negative for back pain.  Skin: Negative for rash.  Neurological: Negative for seizures and headaches.  Psychiatric/Behavioral: Negative for hallucinations.      Allergies  Latex and Sulfa antibiotics  Home Medications   Prior to Admission medications   Medication Sig Start Date End Date Taking? Authorizing Provider  azithromycin (ZITHROMAX Z-PAK) 250 MG tablet 2 po day one, then 1 daily x 4 days 10/04/14   Bethann BerkshireJoseph Plummer Matich, MD  chlorpheniramine-HYDROcodone [facility](TUSSIONEX PENNKINETIC ER) 10-8 MG/5ML LQCR Take 5 mLs by mouth every 12 (twelve) hours as needed for cough. 10/04/14   Bethann BerkshireJoseph Antron Seth, MD  Chromium-Cinnamon (CINNAMON PLUS CHROMIUM PO) Take 2 tablets by mouth 2 (two) times daily.    Historical Provider, MD  Insulin Glargine (LANTUS SOLOSTAR) 100 UNIT/ML SOPN Inject 15 Units into the skin at bedtime. 01/04/13   Marinda ElkAbraham Feliz Ortiz, MD   BP 111/48 mmHg  Pulse 85  Temp(Src) 98 F (36.7 C) (Oral)  Resp 24  Ht 5\' 11"  (1.803 m)  Wt 552 lb (250.386 kg)  BMI 77.02 kg/m2  SpO2 95% Physical Exam  Constitutional: She is oriented to person, place, and time. She appears well-developed.  HENT:  Head: Normocephalic.  Eyes: Conjunctivae and EOM are normal. No scleral icterus.  Neck: Neck supple. No thyromegaly present.  Cardiovascular: Normal rate and regular rhythm.  Exam reveals no gallop and no friction rub.   No murmur heard.  Pulmonary/Chest: No stridor. She has wheezes. She has no rales. She exhibits no tenderness.  Abdominal: She exhibits no distension. There is no tenderness. There is no rebound.  Musculoskeletal: Normal range of motion. She exhibits no edema.  Lymphadenopathy:    She has no cervical adenopathy.  Neurological: She is oriented to person, place, and time. She exhibits normal muscle tone. Coordination normal.  Skin: No rash noted. No erythema.  Psychiatric: She has a normal mood and affect. Her behavior is normal.     ED Course  Procedures (including critical care time) Labs Review Labs Reviewed  CBG MONITORING, ED - Abnormal; Notable for the following:    Glucose-Capillary 321 (*)    All other components within normal limits    Imaging Review Dg Chest 2 View  10/04/2014   CLINICAL DATA:  Patient with nonproductive cough and wheezing for 1 week. History of lymphedema.  EXAM: CHEST  2 VIEW  COMPARISON:  Chest radiograph 09/26/2014  FINDINGS: Stable cardiac and mediastinal contours. No consolidative pulmonary opacities. No pleural effusion or pneumothorax. Mid thoracic spine degenerative change.  IMPRESSION: No acute cardiopulmonary process.   Electronically Signed   By: Annia Belt M.D.   On: 10/04/2014 21:30     EKG Interpretation None      MDM   Final diagnoses:  Bronchitis    Bronchitis and bronchospasm,  tx with zpak and albuterol      Bethann Berkshire, MD 10/06/14 [phone]

## 2014-12-29 ENCOUNTER — Encounter (HOSPITAL_COMMUNITY): Payer: Self-pay | Admitting: *Deleted

## 2014-12-29 ENCOUNTER — Inpatient Hospital Stay (HOSPITAL_COMMUNITY)
Admission: EM | Admit: 2014-12-29 | Discharge: 2015-01-02 | DRG: 603 | Disposition: A | Discharge status: Home / Self Care | Payer: Medicaid Other | Attending: Internal Medicine | Admitting: Internal Medicine

## 2014-12-29 DIAGNOSIS — B372 Candidiasis of skin and nail: Secondary | ICD-10-CM | POA: Diagnosis present

## 2014-12-29 DIAGNOSIS — Z806 Family history of leukemia: Secondary | ICD-10-CM

## 2014-12-29 DIAGNOSIS — E118 Type 2 diabetes mellitus with unspecified complications: Secondary | ICD-10-CM

## 2014-12-29 DIAGNOSIS — E871 Hypo-osmolality and hyponatremia: Secondary | ICD-10-CM | POA: Diagnosis present

## 2014-12-29 DIAGNOSIS — E1165 Type 2 diabetes mellitus with hyperglycemia: Secondary | ICD-10-CM | POA: Diagnosis present

## 2014-12-29 DIAGNOSIS — Z833 Family history of diabetes mellitus: Secondary | ICD-10-CM

## 2014-12-29 DIAGNOSIS — Z6841 Body Mass Index (BMI) 40.0 and over, adult: Secondary | ICD-10-CM

## 2014-12-29 DIAGNOSIS — Z8249 Family history of ischemic heart disease and other diseases of the circulatory system: Secondary | ICD-10-CM

## 2014-12-29 DIAGNOSIS — L03116 Cellulitis of left lower limb: Principal | ICD-10-CM | POA: Diagnosis present

## 2014-12-29 DIAGNOSIS — G4733 Obstructive sleep apnea (adult) (pediatric): Secondary | ICD-10-CM | POA: Diagnosis present

## 2014-12-29 DIAGNOSIS — E861 Hypovolemia: Secondary | ICD-10-CM | POA: Diagnosis present

## 2014-12-29 LAB — BASIC METABOLIC PANEL
Anion gap: 10 (ref 5–15)
BUN: 11 mg/dL (ref 6–20)
CO2: 22 mmol/L (ref 22–32)
Calcium: 9.2 mg/dL (ref 8.9–10.3)
Chloride: 101 mmol/L (ref 101–111)
Creatinine, Ser: 0.66 mg/dL (ref 0.44–1.00)
GFR calc non Af Amer: >60 mL/min (ref >60)
GLUCOSE: 362 mg/dL — AB (ref 65–99)
POTASSIUM: 4.2 mmol/L (ref 3.5–5.1)
Sodium: 133 mmol/L — ABNORMAL LOW (ref 135–145)

## 2014-12-29 LAB — CBC WITH DIFFERENTIAL/PLATELET
BASOS ABS: 0 10*3/uL (ref 0.0–0.1)
Basophils Relative: 0 % (ref 0–1)
Eosinophils Absolute: 0.1 10*3/uL (ref 0.0–0.7)
Eosinophils Relative: 1 % (ref 0–5)
HCT: 42.4 % (ref 36.0–46.0)
Hemoglobin: 14.1 g/dL (ref 12.0–15.0)
LYMPHS ABS: 2.3 10*3/uL (ref 0.7–4.0)
LYMPHS PCT: 26 % (ref 12–46)
MCH: 28.2 pg (ref 26.0–34.0)
MCHC: 33.3 g/dL (ref 30.0–36.0)
MCV: 84.8 fL (ref 78.0–100.0)
Monocytes Absolute: 0.5 10*3/uL (ref 0.1–1.0)
Monocytes Relative: 6 % (ref 3–12)
NEUTROS ABS: 5.9 10*3/uL (ref 1.7–7.7)
Neutrophils Relative %: 67 % (ref 43–77)
PLATELETS: 218 10*3/uL (ref 150–400)
RBC: 5 MIL/uL (ref 3.87–5.11)
RDW: 13.9 % (ref 11.5–15.5)
WBC: 8.8 10*3/uL (ref 4.0–10.5)

## 2014-12-29 LAB — CBG MONITORING, ED: GLUCOSE-CAPILLARY: 339 mg/dL — AB (ref 65–99)

## 2014-12-29 MED ORDER — SODIUM CHLORIDE 0.9 % IV SOLN
1500.0000 mg | Freq: Once | INTRAVENOUS | Status: AC
  Administered 2014-12-29: 1500 mg via INTRAVENOUS
  Filled 2014-12-29: qty 1500

## 2014-12-29 MED ORDER — HYDROMORPHONE HCL 1 MG/ML IJ SOLN
1.0000 mg | Freq: Once | INTRAMUSCULAR | Status: AC
  Administered 2014-12-29: 1 mg via INTRAVENOUS
  Filled 2014-12-29: qty 1

## 2014-12-29 MED ORDER — ALBUTEROL (5 MG/ML) CONTINUOUS INHALATION SOLN
INHALATION_SOLUTION | RESPIRATORY_TRACT | Status: AC
  Filled 2014-12-29: qty 20

## 2014-12-29 MED ORDER — SODIUM CHLORIDE 0.9 % IV BOLUS (SEPSIS)
500.0000 mL | Freq: Once | INTRAVENOUS | Status: AC
  Administered 2014-12-29: 500 mL via INTRAVENOUS

## 2014-12-29 MED ORDER — VANCOMYCIN HCL IN DEXTROSE 1-5 GM/200ML-% IV SOLN
1000.0000 mg | Freq: Once | INTRAVENOUS | Status: AC
  Administered 2014-12-29: 1000 mg via INTRAVENOUS
  Filled 2014-12-29: qty 200

## 2014-12-29 NOTE — H&P (Signed)
Brittany [name] is an 34 y.o. female.    Pcp: unassigned Magazine features editor Complaint: cellulitis HPI: 34 yo female with dm2, lymphedema, apparently c/o redness of the left distal lower ext since yesterday, worse today and therefore presented to the ED for evaluation.  Pt was seen and dx with cellulitis and tx with vanco iv in the Ed, and requested for admission.    Past Medical History  Diagnosis Date  . Diabetes mellitus without complication   . Lymphedema   . Sleep apnea     DX with sleep apnea - unable to use c-pap  . Umbilical hernia   . Depression     Past Surgical History  Procedure Laterality Date  . Cesarean section  11/09/01  . Umbilical hernia repair N/A 01/02/2013    Procedure: LAPAROSCOPIC UMBILICAL HERNIA;  Surgeon: Madilyn Hook, DO;  Location: [facility];  Service: General;  Laterality: N/A;  . Insertion of mesh N/A 01/02/2013    Procedure: INSERTION OF MESH;  Surgeon: Madilyn Hook, DO;  Location: [facility];  Service: General;  Laterality: N/A;    Family History  Problem Relation Age of Onset  . Cancer Mother     leukemia  . Diabetes Mother   . Hypertension Mother   . Hyperlipidemia Mother    Social History:  reports that she has never smoked. She does not have any smokeless tobacco history on file. She reports that she does not drink alcohol or use illicit drugs.  Allergies:  Allergies  Allergen Reactions  . Latex Itching, Swelling and Rash  . Sulfa Antibiotics Nausea And Vomiting and Rash   Medications reviewed   Results for orders placed or performed during the hospital encounter of 12/29/14 (from the past 48 hour(s))  CBG monitoring, ED     Status: Abnormal   Collection Time: 12/29/14  5:38 PM  Result Value Ref Range   Glucose-Capillary 339 (H) 65 - 99 mg/dL  CBC with Differential     Status: None   Collection Time: 12/29/14  5:49 PM  Result Value Ref Range   WBC 8.8 4.0 - 10.5 K/uL   RBC 5.00 3.87 - 5.11 MIL/uL   Hemoglobin 14.1 12.0 -  15.0 g/dL   HCT 42.4 36.0 - 46.0 %   MCV 84.8 78.0 - 100.0 fL   MCH 28.2 26.0 - 34.0 pg   MCHC 33.3 30.0 - 36.0 g/dL   RDW 13.9 11.5 - 15.5 %   Platelets 218 150 - 400 K/uL   Neutrophils Relative % 67 43 - 77 %   Neutro Abs 5.9 1.7 - 7.7 K/uL   Lymphocytes Relative 26 12 - 46 %   Lymphs Abs 2.3 0.7 - 4.0 K/uL   Monocytes Relative 6 3 - 12 %   Monocytes Absolute 0.5 0.1 - 1.0 K/uL   Eosinophils Relative 1 0 - 5 %   Eosinophils Absolute 0.1 0.0 - 0.7 K/uL   Basophils Relative 0 0 - 1 %   Basophils Absolute 0.0 0.0 - 0.1 K/uL  Basic metabolic panel     Status: Abnormal   Collection Time: 12/29/14  5:49 PM  Result Value Ref Range   Sodium 133 (L) 135 - 145 mmol/L   Potassium 4.2 3.5 - 5.1 mmol/L   Chloride 101 101 - 111 mmol/L   CO2 22 22 - 32 mmol/L   Glucose, Bld 362 (H) 65 - 99 mg/dL   BUN 11 6 - 20 mg/dL   Creatinine, Ser  0.66 0.44 - 1.00 mg/dL   Calcium 9.2 8.9 - 10.3 mg/dL   GFR calc non Af Amer >60 >60 mL/min   GFR calc Af Amer >60 >60 mL/min    Comment: (NOTE) The eGFR has been calculated using the CKD EPI equation. This calculation has not been validated in all clinical situations. eGFR's persistently <60 mL/min signify possible Chronic Kidney Disease.    Anion gap 10 5 - 15   No results found.  Review of Systems  Constitutional: Positive for chills. Negative for fever, weight loss, malaise/fatigue and diaphoresis.  HENT: Negative.   Eyes: Negative.   Respiratory: Negative.   Cardiovascular: Negative.   Gastrointestinal: Negative.   Genitourinary: Negative.   Musculoskeletal: Negative.   Skin: Positive for rash. Negative for itching.  Neurological: Negative.  Negative for weakness.  Psychiatric/Behavioral: Negative.     Blood pressure 142/91, pulse 104, temperature 99.5 F (37.5 C), temperature source Oral, resp. rate 19, height '5\' 11"'  (1.803 m), weight 249.478 kg (550 lb), SpO2 97 %. Physical Exam  Constitutional: She is oriented to person, place, and  time. She appears well-developed and well-nourished.  HENT:  Head: Normocephalic and atraumatic.  Mouth/Throat: No oropharyngeal exudate.  Eyes: Conjunctivae and EOM are normal. Pupils are equal, round, and reactive to light. No scleral icterus.  Neck: Normal range of motion. Neck supple. No JVD present. No tracheal deviation present. No thyromegaly present.  Cardiovascular: Normal rate and regular rhythm.  Exam reveals no gallop and no friction rub.   No murmur heard. Respiratory: Breath sounds normal. No respiratory distress. She has no wheezes. She has no rales.  GI: Soft. Bowel sounds are normal. She exhibits no distension. There is no tenderness. There is no rebound and no guarding.  Musculoskeletal: Normal range of motion. She exhibits no edema or tenderness.  Lymphadenopathy:    She has no cervical adenopathy.  Neurological: She is alert and oriented to person, place, and time. She has normal reflexes. She displays normal reflexes. No cranial nerve deficit. She exhibits normal muscle tone. Coordination normal.  Skin: Skin is warm and dry. Rash noted. No erythema. No pallor.  + erythema on the left distal lower ext , from the just below knee down to 1/2 distance to ankle  Psychiatric: She has a normal mood and affect. Her behavior is normal. Judgment and thought content normal.     Assessment/Plan Cellulitis Check esr Check blood culture x2 vanco iv and zosyn iv pharmacy to dose  Dm2 fsbs ac and qhs, iss  Hyponatremia Hydrate with ns iv Check cmp in am  DVT prophylaxis:  Scd, lovenox  Lanaya Bennis 12/29/2014, 8:06 PM

## 2014-12-29 NOTE — ED Notes (Signed)
[facility]com[facility] dose of 2500mg  [facility]

## 2014-12-29 NOTE — ED Provider Notes (Signed)
CSN: [id number]     Arrival date & time 12/29/14  1705 History   First MD Initiated Contact with Patient 12/29/14 1745     Chief Complaint  Patient presents with  . Cellulitis     (Consider location/radiation/quality/duration/timing/severity/associated sxs/prior Treatment) HPI.... Redness and swelling and left lower extremity for 24 hours. No injury. Patient has multiple health problems including morbid obesity, diabetes.  No fever, sweats, chills.  Past Medical History  Diagnosis Date  . Diabetes mellitus without complication   . Lymphedema   . Sleep apnea     DX with sleep apnea - unable to use c-pap  . Umbilical hernia   . Depression    Past Surgical History  Procedure Laterality Date  . Cesarean section  11/09/01  . Umbilical hernia repair N/A 01/02/2013    Procedure: LAPAROSCOPIC UMBILICAL HERNIA;  Surgeon: Lodema Pilot, DO;  Location: [facility];  Service: General;  Laterality: N/A;  . Insertion of mesh N/A 01/02/2013    Procedure: INSERTION OF MESH;  Surgeon: Lodema Pilot, DO;  Location: [facility];  Service: General;  Laterality: N/A;   Family History  Problem Relation Age of Onset  . Cancer Mother     leukemia  . Diabetes Mother   . Hypertension Mother   . Hyperlipidemia Mother    History  Substance Use Topics  . Smoking status: Never Smoker   . Smokeless tobacco: Not on file  . Alcohol Use: No   OB History    No data available     Review of Systems  All other systems reviewed and are negative.     Allergies  Latex and Sulfa antibiotics  Home Medications   Prior to Admission medications   Medication Sig Start Date End Date Taking? Authorizing Provider  ibuprofen (ADVIL,MOTRIN) 200 MG tablet Take 400 mg by mouth every 6 (six) hours as needed for mild pain or moderate pain.   Yes Historical Provider, MD  Insulin Glargine (LANTUS SOLOSTAR) 100 UNIT/ML SOPN Inject 15 Units into the skin at bedtime. Patient taking differently: Inject 30 Units into the skin every  morning.  01/04/13  Yes Marinda Elk, MD   BP 142/91 mmHg  Pulse 104  Temp(Src) 99.5 F (37.5 C) (Oral)  Resp 19  Ht 5\' 11"  (1.803 m)  Wt 550 lb (249.478 kg)  BMI 76.74 kg/m2  SpO2 97% Physical Exam  Constitutional: She is oriented to person, place, and time.  Morbidly obese  HENT:  Head: Normocephalic and atraumatic.  Eyes: Conjunctivae and EOM are normal. Pupils are equal, round, and reactive to light.  Neck: Normal range of motion. Neck supple.  Cardiovascular: Normal rate and regular rhythm.   Pulmonary/Chest: Effort normal and breath sounds normal.  Abdominal: Soft. Bowel sounds are normal.  Musculoskeletal: Normal range of motion.  Neurological: She is alert and oriented to person, place, and time.  Skin:  Left lower extremity:  Erythema and tenderness inferior to knee in calf area  Psychiatric: She has a normal mood and affect. Her behavior is normal.  Nursing note and vitals reviewed.   ED Course  Procedures (including critical care time) Labs Review Labs Reviewed  BASIC METABOLIC PANEL - Abnormal; Notable for the following:    Sodium 133 (*)    Glucose, Bld 362 (*)    All other components within normal limits  CBG MONITORING, ED - Abnormal; Notable for the following:    Glucose-Capillary 339 (*)    All other components within normal limits  CULTURE,  BLOOD (ROUTINE X 2)  CULTURE, BLOOD (ROUTINE X 2)  CBC WITH DIFFERENTIAL/PLATELET    Imaging Review No results found.   EKG Interpretation None      MDM   Final diagnoses:  Cellulitis of left lower extremity    Patient is morbidly reason diabetic. IV vancomycin. Admit to general medicine for cellulitis of left lower extremity    Donnetta Hutching, MD 12/29/14 2020

## 2014-12-29 NOTE — ED Notes (Signed)
Pt has area to rt thigh that is red and tender, Pt is very obese, and says she cannot stand due to the pain.

## 2014-12-29 NOTE — Progress Notes (Signed)
ANTIBIOTIC CONSULT NOTE   Pharmacy Consult for Vancomycin Indication: cellulitis  Allergies  Allergen Reactions  . Latex Itching, Swelling and Rash  . Sulfa Antibiotics Nausea And Vomiting and Rash    Patient Measurements: Height: 5\' 11"  (180.3 cm) Weight: (!) 550 lb (249.478 kg) IBW/kg (Calculated) : 70.8  Vital Signs: Temp: 99.5 F (37.5 C) (06/20 1735) Temp Source: Oral (06/20 1735) BP: 142/91 mmHg (06/20 1735) Pulse Rate: 104 (06/20 1735) Intake/Output from previous day:   Intake/Output from this shift:    Labs:  Recent Labs  12/29/14 1749  WBC 8.8  HGB 14.1  PLT 218  CREATININE 0.66   Estimated Creatinine Clearance: 218.4 mL/min (by C-G formula based on Cr of 0.66). No results for input(s): VANCOTROUGH, VANCOPEAK, VANCORANDOM, GENTTROUGH, GENTPEAK, GENTRANDOM, TOBRATROUGH, TOBRAPEAK, TOBRARND, AMIKACINPEAK, AMIKACINTROU, AMIKACIN in the last 72 hours.   Microbiology: No results found for this or any previous visit (from the past 720 hour(s)).  Anti-infectives    Start     Dose/Rate Route Frequency Ordered Stop   12/29/14 2000  vancomycin (VANCOCIN) 1,500 mg in sodium chloride 0.9 % 500 mL IVPB     1,500 mg 250 mL/hr over 120 Minutes Intravenous  Once 12/29/14 1846     12/29/14 1900  vancomycin (VANCOCIN) IVPB 1000 mg/200 mL premix     1,000 mg 200 mL/hr over 60 Minutes Intravenous  Once 12/29/14 1846        Assessment: 34 yo morbidly obese F with RLE cellulitis.   She is afebrile with normal WBC.   Renal function is at patient's baseline.  NCrCl>100 ml/min.   Goal of Therapy:  Vancomycin trough level 10-15 mcg/ml  Plan:  Vancomycin 2500mg  IV loading dose x1 then 1500mg  IV q8h Check Vancomycin trough at steady state Monitor renal function and cx data   Elson Clan 12/29/2014,8:08 PM

## 2014-12-29 NOTE — ED Notes (Signed)
Report given to [facility], all questions answered.

## 2014-12-30 ENCOUNTER — Encounter (HOSPITAL_COMMUNITY): Payer: Self-pay | Admitting: *Deleted

## 2014-12-30 LAB — COMPREHENSIVE METABOLIC PANEL
ALT: 18 U/L (ref 14–54)
AST: 11 U/L — AB (ref 15–41)
Albumin: 3 g/dL — ABNORMAL LOW (ref 3.5–5.0)
Alkaline Phosphatase: 66 U/L (ref 38–126)
Anion gap: 8 (ref 5–15)
BUN: 9 mg/dL (ref 6–20)
CO2: 24 mmol/L (ref 22–32)
Calcium: 8.2 mg/dL — ABNORMAL LOW (ref 8.9–10.3)
Chloride: 102 mmol/L (ref 101–111)
Creatinine, Ser: 0.61 mg/dL (ref 0.44–1.00)
GFR calc Af Amer: >60 mL/min (ref >60)
GFR calc non Af Amer: >60 mL/min (ref >60)
Glucose, Bld: 315 mg/dL — ABNORMAL HIGH (ref 65–99)
Potassium: 3.8 mmol/L (ref 3.5–5.1)
SODIUM: 134 mmol/L — AB (ref 135–145)
TOTAL PROTEIN: 6.7 g/dL (ref 6.5–8.1)
Total Bilirubin: 1.3 mg/dL — ABNORMAL HIGH (ref 0.3–1.2)

## 2014-12-30 LAB — GLUCOSE, CAPILLARY
GLUCOSE-CAPILLARY: 289 mg/dL — AB (ref 65–99)
Glucose-Capillary: 246 mg/dL — ABNORMAL HIGH (ref 65–99)
Glucose-Capillary: 257 mg/dL — ABNORMAL HIGH (ref 65–99)
Glucose-Capillary: 304 mg/dL — ABNORMAL HIGH (ref 65–99)
Glucose-Capillary: 354 mg/dL — ABNORMAL HIGH (ref 65–99)

## 2014-12-30 LAB — CBC
HCT: 36.9 % (ref 36.0–46.0)
Hemoglobin: 12 g/dL (ref 12.0–15.0)
MCH: 27.8 pg (ref 26.0–34.0)
MCHC: 32.5 g/dL (ref 30.0–36.0)
MCV: 85.6 fL (ref 78.0–100.0)
Platelets: 149 10*3/uL — ABNORMAL LOW (ref 150–400)
RBC: 4.31 MIL/uL (ref 3.87–5.11)
RDW: 13.9 % (ref 11.5–15.5)
WBC: 6.4 10*3/uL (ref 4.0–10.5)

## 2014-12-30 LAB — SEDIMENTATION RATE: SED RATE: 53 mm/h — AB (ref 0–22)

## 2014-12-30 MED ORDER — VANCOMYCIN HCL 10 G IV SOLR
1500.0000 mg | Freq: Three times a day (TID) | INTRAVENOUS | Status: DC
  Administered 2014-12-30 – 2015-01-02 (×10): 1500 mg via INTRAVENOUS
  Filled 2014-12-30 (×19): qty 1500

## 2014-12-30 MED ORDER — INSULIN ASPART 100 UNIT/ML ~~LOC~~ SOLN
0.0000 [IU] | Freq: Three times a day (TID) | SUBCUTANEOUS | Status: DC
  Administered 2014-12-30: 7 [IU] via SUBCUTANEOUS
  Administered 2014-12-31 (×2): 11 [IU] via SUBCUTANEOUS
  Administered 2014-12-31 – 2015-01-01 (×2): 4 [IU] via SUBCUTANEOUS
  Administered 2015-01-01 (×2): 7 [IU] via SUBCUTANEOUS
  Administered 2015-01-02 (×2): 4 [IU] via SUBCUTANEOUS

## 2014-12-30 MED ORDER — PIPERACILLIN-TAZOBACTAM 3.375 G IVPB
3.3750 g | Freq: Three times a day (TID) | INTRAVENOUS | Status: DC
  Administered 2014-12-30 – 2015-01-02 (×11): 3.375 g via INTRAVENOUS
  Filled 2014-12-30 (×20): qty 50

## 2014-12-30 MED ORDER — ACETAMINOPHEN 650 MG RE SUPP: 650.0000 mg | Freq: Four times a day (QID) | RECTAL | Status: DC | PRN

## 2014-12-30 MED ORDER — INSULIN ASPART 100 UNIT/ML ~~LOC~~ SOLN
0.0000 [IU] | Freq: Three times a day (TID) | SUBCUTANEOUS | Status: DC
  Administered 2014-12-30: 5 [IU] via SUBCUTANEOUS
  Administered 2014-12-30: 7 [IU] via SUBCUTANEOUS

## 2014-12-30 MED ORDER — INSULIN ASPART 100 UNIT/ML ~~LOC~~ SOLN
0.0000 [IU] | Freq: Every day | SUBCUTANEOUS | Status: DC
  Administered 2014-12-30: 3 [IU] via SUBCUTANEOUS
  Administered 2014-12-31: 2 [IU] via SUBCUTANEOUS

## 2014-12-30 MED ORDER — ACETAMINOPHEN 325 MG PO TABS
650.0000 mg | ORAL_TABLET | Freq: Four times a day (QID) | ORAL | Status: DC | PRN
  Administered 2014-12-30: 650 mg via ORAL
  Filled 2014-12-30: qty 2

## 2014-12-30 MED ORDER — ENOXAPARIN SODIUM 120 MG/0.8ML ~~LOC~~ SOLN
120.0000 mg | SUBCUTANEOUS | Status: DC
  Administered 2014-12-31 – 2015-01-01 (×3): 120 mg via SUBCUTANEOUS
  Filled 2014-12-30 (×6): qty 0.8

## 2014-12-30 MED ORDER — SODIUM CHLORIDE 0.9 % IV SOLN
INTRAVENOUS | Status: AC
  Administered 2014-12-30: 01:00:00 via INTRAVENOUS

## 2014-12-30 MED ORDER — INSULIN GLARGINE 100 UNIT/ML ~~LOC~~ SOLN
35.0000 [IU] | Freq: Every morning | SUBCUTANEOUS | Status: DC
  Filled 2014-12-30 (×2): qty 0.35

## 2014-12-30 MED ORDER — INSULIN ASPART 100 UNIT/ML ~~LOC~~ SOLN
6.0000 [IU] | Freq: Three times a day (TID) | SUBCUTANEOUS | Status: DC
  Administered 2014-12-30 – 2014-12-31 (×3): 6 [IU] via SUBCUTANEOUS

## 2014-12-30 MED ORDER — PIPERACILLIN-TAZOBACTAM 3.375 G IVPB
INTRAVENOUS | Status: AC
  Filled 2014-12-30: qty 50

## 2014-12-30 MED ORDER — ENOXAPARIN SODIUM 40 MG/0.4ML ~~LOC~~ SOLN
40.0000 mg | SUBCUTANEOUS | Status: DC
  Administered 2014-12-30: 40 mg via SUBCUTANEOUS
  Filled 2014-12-30: qty 0.4

## 2014-12-30 MED ORDER — INSULIN ASPART 100 UNIT/ML ~~LOC~~ SOLN
0.0000 [IU] | Freq: Every day | SUBCUTANEOUS | Status: DC
  Administered 2014-12-30: 5 [IU] via SUBCUTANEOUS

## 2014-12-30 MED ORDER — INSULIN GLARGINE 100 UNIT/ML ~~LOC~~ SOLN
30.0000 [IU] | Freq: Every morning | SUBCUTANEOUS | Status: DC
  Administered 2014-12-30: 30 [IU] via SUBCUTANEOUS
  Filled 2014-12-30 (×2): qty 0.3

## 2014-12-30 NOTE — Care Management Note (Signed)
Case Management Note  Patient Details  Name: Brittany [name] MRN: [id number] Date of Birth: 1981-03-22  Subjective/Objective:                  Pt admitted from home with cellulitis. Pt lives with her husband and daughter and will return home at discharge. Pt is unable to ambulate long distances so has a w/c for home use. Pt also has SCD's and braces for her legs.  Action/Plan: Will continue to follow for discharge planning needs.  Expected Discharge Date:  01/02/15               Expected Discharge Plan:  Home/Self Care  In-House Referral:  NA  Discharge planning Services  CM Consult  Post Acute Care Choice:  NA Choice offered to:  NA  DME Arranged:    DME Agency:     HH Arranged:    HH Agency:     Status of Service:  In process, will continue to follow  Medicare Important Message Given:    Date Medicare IM Given:    Medicare IM give by:    Date Additional Medicare IM Given:    Additional Medicare Important Message give by:     If discussed at Long Length of Stay Meetings, dates discussed:    Additional Comments:  Cheryl Flash, RN 12/30/2014, 11:37 AM

## 2014-12-30 NOTE — Progress Notes (Signed)
UR chart review completed.  

## 2014-12-30 NOTE — Progress Notes (Signed)
[facility] PROGRESS NOTE  Brittany [name] KDX:[id number] DOB: [birth year] DOA: 12/29/2014 PCP: Orbie Hurst, NP  Assessment/Plan: Left thigh cellulitis -Patient has chronic lymphedema and " masses" on the inside of her thighs. -Over the last 3 days this mass on the left side has become indurated, red and extremely painful to touch. -Plan for now to continue IV antibiotics, continue broad-spectrum vancomycin and Zosyn for now. -Blood cultures remain negative to date.  Type 2 diabetes -CBGs not well controlled. -We'll increase Lantus from 30-35 units, change sliding scale to resistant and add milk coverage.  Morbid obesity  Hyponatremia, mild -Improving with IV hydration.  Code Status: Full code Family Communication: Husband Jamie at bedside updated on plan of care  Disposition Plan: Home when ready   Consultants:  None   Antibiotics:  Vancomycin  Zosyn   Subjective: Complains of continued pain to the inside of her left thigh, no chest pain, palpitations or shortness of breath.  Objective: Filed Vitals:   12/29/14 2237 12/29/14 2358 12/30/14 0026 12/30/14 0557  BP: 122/60  99/51 110/62  Pulse: 101   106  Temp: 98.7 F (37.1 C)   98.5 F (36.9 C)  TempSrc: Oral   Oral  Resp: 16   20  Height:  5\' 11"  (1.803 m)    Weight:  245.986 kg (542 lb 4.8 oz)  245.93 kg (542 lb 2.8 oz)  SpO2: 96%   95%    Intake/Output Summary (Last 24 hours) at 12/30/14 1412 Last data filed at 12/30/14 0800  Gross per 24 hour  Intake 586.67 ml  Output      0 ml  Net 586.67 ml   Filed Weights   12/29/14 1735 12/29/14 2358 12/30/14 0557  Weight: 249.478 kg (550 lb) 245.986 kg (542 lb 4.8 oz) 245.93 kg (542 lb 2.8 oz)    Exam:   General:  Alert, awake, oriented 3  Cardiovascular: Regular rate and rhythm  Respiratory: Clear to auscultation bilaterally  Abdomen: Obese, soft, nontender, nondistended, positive bowel sounds  Extremities: Bilateral lymphedema,  bilateral masslike tissue to the inside of both thighs which per patient is chronic, the one on the left is indurated erythematous and very painful to touch.   Neurologic:  Nonfocal  Data Reviewed: Basic Metabolic Panel:  Recent Labs Lab 12/29/14 1749 12/30/14 0530  NA 133* 134*  K 4.2 3.8  CL 101 102  CO2 22 24  GLUCOSE 362* 315*  BUN 11 9  CREATININE 0.66 0.61  CALCIUM 9.2 8.2*   Liver Function Tests:  Recent Labs Lab 12/30/14 0530  AST 11*  ALT 18  ALKPHOS 66  BILITOT 1.3*  PROT 6.7  ALBUMIN 3.0*   No results for input(s): LIPASE, AMYLASE in the last 168 hours. No results for input(s): AMMONIA in the last 168 hours. CBC:  Recent Labs Lab 12/29/14 1749 12/30/14 0530  WBC 8.8 6.4  NEUTROABS 5.9  --   HGB 14.1 12.0  HCT 42.4 36.9  MCV 84.8 85.6  PLT 218 149*   Cardiac Enzymes: No results for input(s): CKTOTAL, CKMB, CKMBINDEX, TROPONINI in the last 168 hours. BNP (last 3 results) No results for input(s): BNP in the last 8760 hours.  ProBNP (last 3 results) No results for input(s): PROBNP in the last 8760 hours.  CBG:  Recent Labs Lab 12/29/14 1738 12/30/14 0025 12/30/14 0736 12/30/14 1119  GLUCAP 339* 354* 304* 289*    Recent Results (from the past 240 hour(s))  Culture, blood (routine  x 2)     Status: None (Preliminary result)   Collection Time: 12/29/14  5:49 PM  Result Value Ref Range Status   Specimen Description RIGHT ANTECUBITAL  Final   Special Requests BOTTLES DRAWN AEROBIC AND ANAEROBIC 6CC  Final   Culture PENDING  Incomplete   Report Status PENDING  Incomplete  Culture, blood (routine x 2)     Status: None (Preliminary result)   Collection Time: 12/29/14  8:20 PM  Result Value Ref Range Status   Specimen Description RIGHT ANTECUBITAL  Final   Special Requests BOTTLES DRAWN AEROBIC AND ANAEROBIC 6CC  Final   Culture PENDING  Incomplete   Report Status PENDING  Incomplete     Studies: No results found.  Scheduled Meds: .  [START ON 12/31/2014] enoxaparin (LOVENOX) injection  120 mg Subcutaneous Q24H  . insulin aspart  0-5 Units Subcutaneous QHS  . insulin aspart  0-9 Units Subcutaneous TID WC  . insulin glargine  30 Units Subcutaneous q morning - 10a  . piperacillin-tazobactam (ZOSYN)  IV  3.375 g Intravenous Q8H  . vancomycin  1,500 mg Intravenous Q8H   Continuous Infusions:   Active Problems:   Diabetes   Cellulitis   Hyponatremia   Cellulitis of left lower extremity    Time spent: 35 minutes. Greater than 50% of this time was spent in direct contact with the patient coordinating care.    Chaya Jan  [facility] Pager [phone]  If 7PM-7AM, please contact night-coverage at [email or web address], password [facility] 12/30/2014, 2:12 PM  LOS: 1 day

## 2014-12-31 DIAGNOSIS — E1165 Type 2 diabetes mellitus with hyperglycemia: Secondary | ICD-10-CM

## 2014-12-31 DIAGNOSIS — B372 Candidiasis of skin and nail: Secondary | ICD-10-CM | POA: Diagnosis present

## 2014-12-31 DIAGNOSIS — L03116 Cellulitis of left lower limb: Principal | ICD-10-CM

## 2014-12-31 LAB — CBC
HEMATOCRIT: 37.9 % (ref 36.0–46.0)
Hemoglobin: 12.4 g/dL (ref 12.0–15.0)
MCH: 28.1 pg (ref 26.0–34.0)
MCHC: 32.7 g/dL (ref 30.0–36.0)
MCV: 85.7 fL (ref 78.0–100.0)
PLATELETS: 162 10*3/uL (ref 150–400)
RBC: 4.42 MIL/uL (ref 3.87–5.11)
RDW: 13.8 % (ref 11.5–15.5)
WBC: 6.8 10*3/uL (ref 4.0–10.5)

## 2014-12-31 LAB — GLUCOSE, CAPILLARY
Glucose-Capillary: 278 mg/dL — ABNORMAL HIGH (ref 65–99)
Glucose-Capillary: 181 mg/dL — ABNORMAL HIGH (ref 65–99)
Glucose-Capillary: 280 mg/dL — ABNORMAL HIGH (ref 65–99)
Glucose-Capillary: 217 mg/dL — ABNORMAL HIGH (ref 65–99)

## 2014-12-31 LAB — BASIC METABOLIC PANEL
ANION GAP: 10 (ref 5–15)
BUN: 7 mg/dL (ref 6–20)
CHLORIDE: 104 mmol/L (ref 101–111)
CO2: 22 mmol/L (ref 22–32)
Calcium: 8.3 mg/dL — ABNORMAL LOW (ref 8.9–10.3)
Creatinine, Ser: 0.64 mg/dL (ref 0.44–1.00)
Glucose, Bld: 288 mg/dL — ABNORMAL HIGH (ref 65–99)
POTASSIUM: 3.6 mmol/L (ref 3.5–5.1)
SODIUM: 136 mmol/L (ref 135–145)

## 2014-12-31 LAB — HEMOGLOBIN A1C
Hgb A1c MFr Bld: 11.7 % — ABNORMAL HIGH (ref 4.8–5.6)
Mean Plasma Glucose: 289 mg/dL

## 2014-12-31 MED ORDER — IBUPROFEN 400 MG PO TABS
400.0000 mg | ORAL_TABLET | Freq: Once | ORAL | Status: AC
  Administered 2014-12-31: 400 mg via ORAL
  Filled 2014-12-31: qty 1

## 2014-12-31 MED ORDER — INSULIN GLARGINE 100 UNIT/ML ~~LOC~~ SOLN
45.0000 [IU] | Freq: Every morning | SUBCUTANEOUS | Status: DC
  Administered 2014-12-31 – 2015-01-01 (×2): 45 [IU] via SUBCUTANEOUS
  Filled 2014-12-31 (×3): qty 0.45

## 2014-12-31 MED ORDER — POTASSIUM CHLORIDE CRYS ER 20 MEQ PO TBCR
20.0000 meq | EXTENDED_RELEASE_TABLET | Freq: Every day | ORAL | Status: AC
  Administered 2014-12-31 – 2015-01-02 (×3): 20 meq via ORAL
  Filled 2014-12-31 (×3): qty 1

## 2014-12-31 MED ORDER — IBUPROFEN 400 MG PO TABS
400.0000 mg | ORAL_TABLET | Freq: Four times a day (QID) | ORAL | Status: DC | PRN
  Administered 2014-12-31: 400 mg via ORAL
  Filled 2014-12-31: qty 1

## 2014-12-31 MED ORDER — NYSTATIN 100000 UNIT/GM EX POWD
Freq: Two times a day (BID) | CUTANEOUS | Status: DC
  Administered 2014-12-31 – 2015-01-02 (×4): via TOPICAL
  Filled 2014-12-31: qty 15

## 2014-12-31 MED ORDER — INSULIN ASPART 100 UNIT/ML ~~LOC~~ SOLN
10.0000 [IU] | Freq: Three times a day (TID) | SUBCUTANEOUS | Status: DC
  Administered 2014-12-31 – 2015-01-01 (×3): 10 [IU] via SUBCUTANEOUS

## 2014-12-31 MED ORDER — FLUCONAZOLE 100 MG PO TABS
150.0000 mg | ORAL_TABLET | Freq: Every day | ORAL | Status: DC
  Administered 2014-12-31 – 2015-01-02 (×3): 150 mg via ORAL
  Filled 2014-12-31 (×3): qty 2

## 2014-12-31 NOTE — Progress Notes (Signed)
[facility] PROGRESS NOTE  Brittany [name]:[id number] DOB: 1980-08-08 DOA: 12/29/2014 PCP: Orbie Hurst, NP    Code Status: Full code Family Communication: Family not available; discussed with patient Disposition Plan: Discharge when clinically appropriate   Consultants:  None  Procedures:  None  Antibiotics:  Diflucan start 6/22  Vancomycin  Zosyn  HPI/Subjective: Patient says the soreness of her left leg has subsided; the redness has also subsided. Otherwise, she has no complaints of subjective fever, chills, nausea, or vomiting.  Objective: Filed Vitals:   12/31/14 0556  BP: 118/70  Pulse: 108  Temp: 98.2 F (36.8 C)  Resp: 18   oxygen saturation 95% on room air.  Intake/Output Summary (Last 24 hours) at 12/31/14 1225 Last data filed at 12/31/14 0557  Gross per 24 hour  Intake 2165.83 ml  Output   1700 ml  Net 465.83 ml   Filed Weights   12/29/14 2358 12/30/14 0557 12/31/14 0556  Weight: 245.986 kg (542 lb 4.8 oz) 245.93 kg (542 lb 2.8 oz) 238.683 kg (526 lb 3.2 oz)    Exam:   General: Morbidly obese 34 year old Caucasian woman in no acute distress.  Cardiovascular: S1, S2, with no murmurs rubs or callus.  Respiratory: Clear to auscultation bilaterally.  Abdomen: Morbidly obese, positive bowel sounds, soft, nontender, nondistended.  Musculoskeletal: No acute hot red joints. Trace of pedal edema bilaterally.  Skin: Large area of mild erythema over the anterior thigh of the left leg with some migration to the inner left leg-mildly tender; erythema underneath abdominal pannus and popliteal area of the right leg.  Data Reviewed: Basic Metabolic Panel:  Recent Labs Lab 12/29/14 1749 12/30/14 0530 12/31/14 0602  NA 133* 134* 136  K 4.2 3.8 3.6  CL 101 102 104  CO2 GLUCOSE 362* 315* 288*  BUN CREATININE 0.66 0.61 0.64  CALCIUM 9.2 8.2* 8.3*   Liver Function Tests:  Recent Labs Lab 12/30/14 0530  AST  11*  ALT 18  ALKPHOS 66  BILITOT 1.3*  PROT 6.7  ALBUMIN 3.0*   No results for input(s): LIPASE, AMYLASE in the last 168 hours. No results for input(s): AMMONIA in the last 168 hours. CBC:  Recent Labs Lab 12/29/14 1749 12/30/14 0530 12/31/14 0602  WBC 8.8 6.4 6.8  NEUTROABS 5.9  --   --   HGB 14.1 12.0 12.4  HCT 42.4 36.9 37.9  MCV 84.8 85.6 85.7  PLT 218 149* 162   Cardiac Enzymes: No results for input(s): CKTOTAL, CKMB, CKMBINDEX, TROPONINI in the last 168 hours. BNP (last 3 results) No results for input(s): BNP in the last 8760 hours.  ProBNP (last 3 results) No results for input(s): PROBNP in the last 8760 hours.  CBG:  Recent Labs Lab 12/30/14 1119 12/30/14 1621 12/30/14 2111 12/31/14 0727 12/31/14 1148  GLUCAP 289* 246* 257* 278* 181*    Recent Results (from the past 240 hour(s))  Culture, blood (routine x 2)     Status: None (Preliminary result)   Collection Time: 12/29/14  5:49 PM  Result Value Ref Range Status   Specimen Description BLOOD RIGHT ANTECUBITAL  Final   Special Requests BOTTLES DRAWN AEROBIC AND ANAEROBIC 6CC  Final   Culture NO GROWTH 2 DAYS  Final   Report Status PENDING  Incomplete  Culture, blood (routine x 2)     Status: None (Preliminary result)   Collection Time: 12/29/14  8:21 PM  Result Value Ref Range Status  Specimen Description BLOOD RIGHT ANTECUBITAL  Final   Special Requests BOTTLES DRAWN AEROBIC AND ANAEROBIC 6CC  Final   Culture NO GROWTH 2 DAYS  Final   Report Status PENDING  Incomplete     Studies: No results found.  Scheduled Meds: . enoxaparin (LOVENOX) injection  120 mg Subcutaneous Q24H  . insulin aspart  0-20 Units Subcutaneous TID WC  . insulin aspart  0-5 Units Subcutaneous QHS  . insulin aspart  6 Units Subcutaneous TID WC  . insulin glargine  45 Units Subcutaneous q morning - 10a  . piperacillin-tazobactam (ZOSYN)  IV  3.375 g Intravenous Q8H  . vancomycin  1,500 mg Intravenous Q8H   Continuous  Infusions:   Assessment and plan: Principal Problem:   Cellulitis of left lower extremity Active Problems:   Candidal intertrigo   DM (diabetes mellitus), type 2, uncontrolled   Morbid obesity   Hyponatremia   1. Cellulitis of the left lower extremity. The patient was borderline febrile on admission with a temperature of 99.5. Her white blood cell count was within normal limits. She was started on vancomycin and Zosyn. Per patient, she is less symptomatic with pain and there is less erythema. I believe there is some superimposed Candida/fungal colonization, so will start Diflucan and topical nystatin powder.  Would favor narrowing antibiotic therapy in the next 24-48 hours.  Candidal intertrigo.  Given the patient's body habitus and diabetes mellitus and malodor, she is likely colonized with Candida. -We'll start oral Diflucan as stated above.  Type 2 diabetes mellitus, uncontrolled. Patient's hemoglobin A1c was noted to be 11.7, indicating poor outpatient control. Diabetes coordinator recommendations noted and appreciated. -Lantus was increased to 45 units and will increase mealtime coverage to 10 units 3 times a day with meals; continue resistant sliding scale NovoLog.  Hyponatremia. Patient serum sodium was 133 on admission which was likely secondary to hyperglycemia and hypovolemia. With IV fluid hydration, it has normalized.       Time spent: 25 minutes    [facility]  [facility] Pager [phone]. If 7PM-7AM, please contact night-coverage at [email or web address], password [facility] 12/31/2014, 12:25 PM  LOS: 2 days

## 2014-12-31 NOTE — Progress Notes (Signed)
Inpatient Diabetes Program Recommendations  AACE/ADA: New Consensus Statement on Inpatient Glycemic Control (2013)  Target Ranges:  Prepandial:   less than 140 mg/dL      Peak postprandial:   less than 180 mg/dL (1-2 hours)      Critically ill patients:  140 - 180 mg/dL   Results for Brittany [name], Brittany [name] (MRN [id number]) as of 12/31/2014 07:58  Ref. Range 12/30/2014 07:36 12/30/2014 11:19 12/30/2014 16:21 12/30/2014 21:11 12/31/2014 07:27  Glucose-Capillary Latest Ref Range: 65-99 mg/dL 884 (H) 166 (H) 063 (H) 257 (H) 278 (H)   Diabetes history: DM2 Outpatient Diabetes medications: Lantus 30 units QAM Current orders for Inpatient glycemic control: Lantus 35 units daily, Novolog 0-20 units TID with meals, Novolog 0-5 units HS, Novolog 6 units TID with meals for meal coverage  Inpatient Diabetes Program Recommendations Insulin - Basal: Please consider increasing Lantus to 40 units daily starting now. Insulin - Meal Coverage: Please consder increasing meal coverage to 10 units TID with meals. HgbA1C: A1C 11.7% on 12/29/14.  Thanks, Orlando Penner, RN, MSN, CCRN, CDE Diabetes Coordinator Inpatient Diabetes Program [phone] (Team Pager from 8am to 5pm) [phone] ([place] office) [phone] [facility] office) [phone] [facility] office)

## 2015-01-01 LAB — GLUCOSE, CAPILLARY
GLUCOSE-CAPILLARY: 237 mg/dL — AB (ref 65–99)
GLUCOSE-CAPILLARY: 243 mg/dL — AB (ref 65–99)
GLUCOSE-CAPILLARY: 158 mg/dL — AB (ref 65–99)
GLUCOSE-CAPILLARY: 167 mg/dL — AB (ref 65–99)

## 2015-01-01 LAB — VANCOMYCIN, TROUGH: Vancomycin Tr: 14 ug/mL (ref 10.0–20.0)

## 2015-01-01 MED ORDER — INSULIN GLARGINE 100 UNIT/ML ~~LOC~~ SOLN
55.0000 [IU] | Freq: Every morning | SUBCUTANEOUS | Status: DC
  Administered 2015-01-02: 55 [IU] via SUBCUTANEOUS
  Filled 2015-01-01 (×4): qty 0.55

## 2015-01-01 MED ORDER — INSULIN ASPART 100 UNIT/ML ~~LOC~~ SOLN
12.0000 [IU] | Freq: Three times a day (TID) | SUBCUTANEOUS | Status: DC
  Administered 2015-01-01 – 2015-01-02 (×3): 12 [IU] via SUBCUTANEOUS

## 2015-01-01 NOTE — Progress Notes (Addendum)
Inpatient Diabetes Program Recommendations  AACE/ADA: New Consensus Statement on Inpatient Glycemic Control (2013)  Target Ranges:  Prepandial:   less than 140 mg/dL      Peak postprandial:   less than 180 mg/dL (1-2 hours)      Critically ill patients:  140 - 180 mg/dL   Results for MADLYNN, LUNDEEN (MRN [id number]) as of 01/01/2015 10:13  Ref. Range 12/31/2014 07:27 12/31/2014 11:48 12/31/2014 17:02 12/31/2014 21:17  Glucose-Capillary Latest Ref Range: 65-99 mg/dL 409 (H) 811 (H) 914 (H) 217 (H)   Diabetes history: DM2 Outpatient Diabetes medications: Lantus 30 units QAM Current orders for Inpatient glycemic control: Lantus 45 units daily, Novolog 0-20 units TID with meals, Novolog 0-5 units HS, Novolog 10 units TID with meals for meal coverage  Inpatient Diabetes Program Recommendations Insulin - Basal: Fasting glucose not recorded in chart this morning but based on Novolog 7 units given for correction this morning glucose was between 201-250 mg/dl.  Please consider increasing Lantus to 50 units daily. Insulin - Meal Coverage: Please consider increasing meal coverage to 15 units TID with meals.  01/01/15@14 :45-Spoke with patient about diabetes and home regimen for diabetes control. Patient reports that she is followed by her PCP for diabetes management and currently she takes Lantus 30 units QAM (insulin pen) as an outpatient for diabetes control. Patient reports that she has been on Lantus 30 units QAM for about 8-9 months. Patient reports that she is NOT checking her glucose at home. Patient reports that she had an Accu-Check glucometer that stopped working and a friend of hers went out an bought her a Academic librarian. However she states that she can not afford the test strips for the Contour meter.  Inquired about Medicaid coverage for the test strips and patient states that Medicaid will pay for the strips but she needs a new prescription for them. Informed patient that  a prescription for  test strips would be requested from discharging doctor here at the hospital. Informed about that if strips are not covered under Medicaid she could purchase the Reli-On glucometer at [facility] for $15 and a box of 50 strips for $9.  Inquired about knowledge about A1C and patient reports that she knows what an A1C is and reports that her last A1C was in the 12% range. Discussed A1C results (11.7% on 12/29/14) and reviewed what an A1C is, basic pathophysiology of DM Type 2, basic home care, importance of checking CBGs and maintaining good CBG control to prevent long-term and short-term complications. Discussed impact of nutrition, exercise, stress, sickness, and medications on diabetes control.   Patient states that she is not followed by an endocrinologist and she would like to be referred to a local endocrinologist. Provided contact information for Dr. Fransico Him and asked that she talk with her PCP about getting a referral for Dr. Fransico Him so she can get help with improving diabetes control.  Asked patient to check her glucose at least 4 times per day and to keep a log of glucose readings which can be used by the doctor to make adjustments with DM medications. Discussed current insulin regimen and asked if she would be willing to take Novolog meal coverage and/or correction as an outpatient. Patient states that she is willing to take Novolog insulin if necessary. Stressed importance of monitoring glucose routinely especially since she is taking insulin.  Patient verbalized understanding of information discussed and she states that she has no further questions at this time related to diabetes.  MD-Please provide patient with a prescription for test strips at time of discharge. Patient states that she is willing to take Novolog meal coverage and/or correction as an outpatient.   Thanks, Orlando Penner, RN, MSN, CCRN, CDE Diabetes Coordinator Inpatient Diabetes Program [phone] (Team Pager from 8am to 5pm) [phone]  ([place] office) [phone] [facility] office) [phone] [facility] office)

## 2015-01-01 NOTE — Progress Notes (Signed)
[facility] PROGRESS NOTE  Brittany [name] IAX:[id number] DOB: 03/05/1981 DOA: 12/29/2014 PCP: Orbie Hurst, NP    Code Status: Full code Family Communication: Family not available; discussed with patient Disposition Plan: Discharge when clinically appropriate   Consultants:  None  Procedures:  None  Antibiotics:  Diflucan start 6/22  Vancomycin  Zosyn  HPI/Subjective: Patient says she sees less redness of her popliteal area on the right and less redness over her left thigh. Her legs don't seem quite as sore and she feels better overall.  Objective: Filed Vitals:   01/01/15 0630  BP: 111/61  Pulse: 81  Temp: 97.8 F (36.6 C)  Resp: 21   oxygen saturation 100% on room air.  Intake/Output Summary (Last 24 hours) at 01/01/15 1552 Last data filed at 12/31/14 2114  Gross per 24 hour  Intake    740 ml  Output    400 ml  Net    340 ml   Filed Weights   12/30/14 0557 12/31/14 0556 01/01/15 0630  Weight: 245.93 kg (542 lb 2.8 oz) 238.683 kg (526 lb 3.2 oz) 239.726 kg (528 lb 8 oz)    Exam:   General: Morbidly obese 34 year old Caucasian woman in no acute distress.  Cardiovascular: S1, S2, with no murmurs rubs or callus.  Respiratory: Clear to auscultation bilaterally.  Abdomen: Morbidly obese, positive bowel sounds, soft, nontender, nondistended.  Musculoskeletal: No acute hot red joints. Trace of pedal edema bilaterally.  Skin: Large area of mild, subsiding erythema with some induration over the anterior thigh of the left leg with some migration to the inner left leg-mildly tender; significant less erythema of her popliteal area of the right leg.  Data Reviewed: Basic Metabolic Panel:  Recent Labs Lab 12/29/14 1749 12/30/14 0530 12/31/14 0602  NA 133* 134* 136  K 4.2 3.8 3.6  CL 101 102 104  CO2 22 24 22   GLUCOSE 362* 315* 288*  BUN 11 9 7   CREATININE 0.66 0.61 0.64  CALCIUM 9.2 8.2* 8.3*   Liver Function Tests:  Recent  Labs Lab 12/30/14 0530  AST 11*  ALT 18  ALKPHOS 66  BILITOT 1.3*  PROT 6.7  ALBUMIN 3.0*   No results for input(s): LIPASE, AMYLASE in the last 168 hours. No results for input(s): AMMONIA in the last 168 hours. CBC:  Recent Labs Lab 12/29/14 1749 12/30/14 0530 12/31/14 0602  WBC 8.8 6.4 6.8  NEUTROABS 5.9  --   --   HGB 14.1 12.0 12.4  HCT 42.4 36.9 37.9  MCV 84.8 85.6 85.7  PLT 218 149* 162   Cardiac Enzymes: No results for input(s): CKTOTAL, CKMB, CKMBINDEX, TROPONINI in the last 168 hours. BNP (last 3 results) No results for input(s): BNP in the last 8760 hours.  ProBNP (last 3 results) No results for input(s): PROBNP in the last 8760 hours.  CBG:  Recent Labs Lab 12/31/14 1148 12/31/14 1702 12/31/14 2117 01/01/15 0751 01/01/15 1130  GLUCAP 181* 280* 217* 237* 243*    Recent Results (from the past 240 hour(s))  Culture, blood (routine x 2)     Status: None (Preliminary result)   Collection Time: 12/29/14  5:49 PM  Result Value Ref Range Status   Specimen Description BLOOD RIGHT ANTECUBITAL  Final   Special Requests BOTTLES DRAWN AEROBIC AND ANAEROBIC 6CC  Final   Culture NO GROWTH 3 DAYS  Final   Report Status PENDING  Incomplete  Culture, blood (routine x 2)     Status: None (Preliminary  result)   Collection Time: 12/29/14  8:21 PM  Result Value Ref Range Status   Specimen Description BLOOD RIGHT ANTECUBITAL  Final   Special Requests BOTTLES DRAWN AEROBIC AND ANAEROBIC 6CC  Final   Culture NO GROWTH 3 DAYS  Final   Report Status PENDING  Incomplete     Studies: No results found.  Scheduled Meds: . enoxaparin (LOVENOX) injection  120 mg Subcutaneous Q24H  . fluconazole  150 mg Oral Daily  . insulin aspart  0-20 Units Subcutaneous TID WC  . insulin aspart  0-5 Units Subcutaneous QHS  . insulin aspart  10 Units Subcutaneous TID WC  . insulin glargine  45 Units Subcutaneous q morning - 10a  . nystatin   Topical BID  . piperacillin-tazobactam  (ZOSYN)  IV  3.375 g Intravenous Q8H  . potassium chloride  20 mEq Oral Daily  . vancomycin  1,500 mg Intravenous Q8H   Continuous Infusions:   Assessment and plan: Principal Problem:   Cellulitis of left lower extremity Active Problems:   Candidal intertrigo   DM (diabetes mellitus), type 2, uncontrolled   Morbid obesity   Hyponatremia   1. Cellulitis of the left lower extremity. The patient was borderline febrile on admission with a temperature of 99.5. Her white blood cell count was within normal limits. She was started on vancomycin and Zosyn. Per patient, she is less symptomatic with pain and there is less erythema. Because there appeared to be some superimposed Candida/fungal colonization, Diflucan and topical nystatin powder were started. There has been some improvement over the past 24 hours.  Would favor narrowing antibiotic therapy in the next 24 hours. -We'll add warm compresses to help with decreasing induration.  Candidal intertrigo. Given the patient's body habitus and diabetes mellitus and malodor, she is likely colonized with Candida. Diflucan and topical nystatin were ordered. There has been significant improvement compared to yesterday.  Type 2 diabetes mellitus, uncontrolled. Patient's hemoglobin A1c was noted to be 11.7, indicating poor outpatient control. Diabetes coordinator recommendations noted and appreciated. -Lantus will be increased again to 55 units and will increase mealtime coverage to 12 units 3 times daily; continue resistant sliding scale NovoLog.  Hyponatremia. Patient serum sodium was 133 on admission which was likely secondary to hyperglycemia and hypovolemia. With IV fluid hydration, it has normalized.       Time spent: 25 minutes    [facility]  [facility] Pager [phone]. If 7PM-7AM, please contact night-coverage at [email or web address], password [facility] 01/01/2015, 3:52 PM  LOS: 3 days

## 2015-01-01 NOTE — Progress Notes (Signed)
ANTIBIOTIC CONSULT NOTE   Pharmacy Consult for Vancomycin Indication: cellulitis  Allergies  Allergen Reactions  . Latex Itching, Swelling and Rash  . Sulfa Antibiotics Nausea And Vomiting and Rash   Patient Measurements: Height: 5\' 11"  (180.3 cm) Weight: (!) 528 lb 8 oz (239.726 kg) IBW/kg (Calculated) : 70.8  Vital Signs: Temp: 97.8 F (36.6 C) (06/23 0630) Temp Source: Oral (06/23 0630) BP: 111/61 mmHg (06/23 0630) Pulse Rate: 81 (06/23 0630) Intake/Output from previous day: 06/22 0701 - 06/23 0700 In: 1340 [P.O.:240; IV Piggyback:1100] Out: 400 [Urine:400] Intake/Output from this shift:    Labs:  Recent Labs  12/29/14 1749 12/30/14 0530 12/31/14 0602  WBC 8.8 6.4 6.8  HGB 14.1 12.0 12.4  PLT 218 149* 162  CREATININE 0.66 0.61 0.64   Estimated Creatinine Clearance: 212.4 mL/min (by C-G formula based on Cr of 0.64).  Recent Labs  01/01/15 0913  VANCOTROUGH 14     Microbiology: Recent Results (from the past 720 hour(s))  Culture, blood (routine x 2)     Status: None (Preliminary result)   Collection Time: 12/29/14  5:49 PM  Result Value Ref Range Status   Specimen Description BLOOD RIGHT ANTECUBITAL  Final   Special Requests BOTTLES DRAWN AEROBIC AND ANAEROBIC 6CC  Final   Culture NO GROWTH 2 DAYS  Final   Report Status PENDING  Incomplete  Culture, blood (routine x 2)     Status: None (Preliminary result)   Collection Time: 12/29/14  8:21 PM  Result Value Ref Range Status   Specimen Description BLOOD RIGHT ANTECUBITAL  Final   Special Requests BOTTLES DRAWN AEROBIC AND ANAEROBIC 6CC  Final   Culture NO GROWTH 2 DAYS  Final   Report Status PENDING  Incomplete    Anti-infectives    Start     Dose/Rate Route Frequency Ordered Stop   12/31/14 1245  fluconazole (DIFLUCAN) tablet 150 mg     150 mg Oral Daily 12/31/14 1235     12/30/14 1000  vancomycin (VANCOCIN) 1,500 mg in sodium chloride 0.9 % 500 mL IVPB     1,500 mg 250 mL/hr over 120 Minutes  Intravenous Every 8 hours 12/30/14 0945     12/30/14 0030  piperacillin-tazobactam (ZOSYN) IVPB 3.375 g     3.375 g 12.5 mL/hr over 240 Minutes Intravenous Every 8 hours 12/30/14 0000     12/29/14 2000  vancomycin (VANCOCIN) 1,500 mg in sodium chloride 0.9 % 500 mL IVPB     1,500 mg 250 mL/hr over 120 Minutes Intravenous  Once 12/29/14 1846 12/29/14 2316   12/29/14 1900  vancomycin (VANCOCIN) IVPB 1000 mg/200 mL premix     1,000 mg 200 mL/hr over 60 Minutes Intravenous  Once 12/29/14 1846 12/29/14 2039      Assessment: 34 yo morbidly obese F with RLE cellulitis.   She is afebrile with normal WBC.   Renal function is at patient's baseline.  NCrCl>100 ml/min.  Trough level is on target.  Goal of Therapy:  Vancomycin trough level 10-15 mcg/ml  Plan:  Continue Vancomycin 1500mg  IV q8h Check Vancomycin trough weekly or sooner if warranted Continue Zosyn per MD Monitor renal function and cx data  Duration of therapy per MD, deescalate ABX when appropriate  Valrie Hart A 01/01/2015,10:01 AM

## 2015-01-02 LAB — GLUCOSE, CAPILLARY
GLUCOSE-CAPILLARY: 164 mg/dL — AB (ref 65–99)
GLUCOSE-CAPILLARY: 179 mg/dL — AB (ref 65–99)

## 2015-01-02 LAB — BASIC METABOLIC PANEL
Anion gap: 10 (ref 5–15)
BUN: 6 mg/dL (ref 6–20)
CO2: 23 mmol/L (ref 22–32)
Calcium: 8.2 mg/dL — ABNORMAL LOW (ref 8.9–10.3)
Chloride: 104 mmol/L (ref 101–111)
Creatinine, Ser: 0.58 mg/dL (ref 0.44–1.00)
GFR calc Af Amer: >60 mL/min (ref >60)
GFR calc non Af Amer: >60 mL/min (ref >60)
GLUCOSE: 192 mg/dL — AB (ref 65–99)
POTASSIUM: 3.7 mmol/L (ref 3.5–5.1)
Sodium: 137 mmol/L (ref 135–145)

## 2015-01-02 LAB — CBC
HEMATOCRIT: 36.3 % (ref 36.0–46.0)
HEMOGLOBIN: 11.6 g/dL — AB (ref 12.0–15.0)
MCH: 27.6 pg (ref 26.0–34.0)
MCHC: 32 g/dL (ref 30.0–36.0)
MCV: 86.2 fL (ref 78.0–100.0)
Platelets: 163 10*3/uL (ref 150–400)
RBC: 4.21 MIL/uL (ref 3.87–5.11)
RDW: 13.9 % (ref 11.5–15.5)
WBC: 5.9 10*3/uL (ref 4.0–10.5)

## 2015-01-02 MED ORDER — FLUCONAZOLE 100 MG PO TABS: 100.0000 mg | ORAL_TABLET | Freq: Every day | ORAL | Status: DC

## 2015-01-02 MED ORDER — INSULIN GLARGINE 100 UNIT/ML SOLOSTAR PEN: 50.0000 [IU] | PEN_INJECTOR | Freq: Every day | SUBCUTANEOUS | Status: DC

## 2015-01-02 MED ORDER — INSULIN ASPART 100 UNIT/ML ~~LOC~~ SOLN: SUBCUTANEOUS | Status: DC

## 2015-01-02 MED ORDER — AMOXICILLIN-POT CLAVULANATE 875-125 MG PO TABS: 1.0000 | ORAL_TABLET | Freq: Two times a day (BID) | ORAL | Status: DC

## 2015-01-02 NOTE — Discharge Summary (Signed)
Physician Discharge Summary  Brittany [name] WTU:[id number] DOB: [birth year] DOA: 12/29/2014  PCP: Orbie Hurst, NP  Admit date: 12/29/2014 Discharge date: 01/02/2015  Time spent: GREATER THAN 30 minutes  Recommendations for Outpatient Follow-up:  1. Recommend outpatient referral to endocrinology    Discharge Diagnoses:   1. Cellulitis of the left lower extremity. 2. Candidal intertrigo. 3. Type 2 diabetes mellitus, uncontrolled. Hemoglobin A1c 11.7. 4. Hyponatremia secondary to hypovolemia. 5. Morbid obesity. Patient's weight was approximately 527 pounds.  Discharge Condition: Improved.  Diet recommendation: Carbohydrate modified.  Filed Weights   12/31/14 0556 01/01/15 0630 01/02/15 0709  Weight: 238.683 kg (526 lb 3.2 oz) 239.726 kg (528 lb 8 oz) 238.773 kg (526 lb 6.4 oz)    History of present illness:  The patient is a 34 or old woman with a history of morbid obesity, lower extremity lymphedema, obstructive sleep apnea, and diabetes mellitus, who presented to the emergency department with a complaint of left lower extremity redness and pain. In the ED, she was borderline febrile and mildly tachycardic, but otherwise stable. Her white blood cell count was within normal limits. Her venous glucose was 362. Her serum sodium was slightly low at 133. She was admitted for further evaluation and management.  Hospital Course:  1. Cellulitis of the left lower extremity. The patient was borderline febrile on admission with a temperature of 99.5. Her white blood cell count was within normal limits. She was noted to have significant erythema and induration over the anterior left thigh. She was started on vancomycin and Zosyn.  Because there appeared to be some superimposed Candida/fungal colonization, Diflucan and topical nystatin powder were started. With the addition of Diflucan, the erythema subsided more quickly. Also added were warm compresses to help with decreasing the  induration. The cellulitis subsided with only a scant amount of erythema and induration remaining. The area was less painful. She received 4-1/2 days of IV antibiotic. She was discharged on 4 more days of Augmentin and 3 more days of Diflucan. Candidal intertrigo. Given the patient's body habitus and diabetes mellitus and malodor, she was likely colonized with Candida. Diflucan and topical nystatin were added to the treatment, with improvement. She was discharged on 3 more days of Diflucan area Type 2 diabetes mellitus, uncontrolled. Patient's hemoglobin A1c was noted to be 11.7, indicating poor outpatient control. The patient had been taking 15-30 units of Lantus at home with no sliding scale NovoLog coverage. During the hospital course, she required increasing doses of sliding scale NovoLog and Lantus. Diabetes coordinator recommendations were noted and appreciated. She was discharged on a much higher dose of Lantus and sliding scale NovoLog 3 times a day. She will follow-up with her new PCP, Dr. Margo Common. She will request an outpatient referral to Dr. Fransico Him per my conversation with her. I was in agreement with this. Hyponatremia. Patient serum sodium was 133 on admission which was likely secondary to hyperglycemia and hypovolemia. With IV fluid hydration, it normalized.   Procedures:  None  Consultations:  None  Discharge Exam: Filed Vitals:   01/02/15 0709  BP: 112/59  Pulse: 85  Temp: 98.5 F (36.9 C)  Resp: 20    General: Morbidly obese 34 year old Caucasian woman in no acute distress.  Cardiovascular: S1, S2, with no murmurs rubs or callus.  Respiratory: Clear to auscultation bilaterally.  Abdomen: Morbidly obese, positive bowel sounds, soft, nontender, nondistended.  Musculoskeletal: No acute hot red joints. Trace of pedal edema bilaterally.  Skin: Almost complete resolution of erythema  with some remaining residual induration over the anterior thigh of the left leg.  Less tender. Trace of pedal edema bilaterally.    Discharge Instructions   Discharge Instructions    Diet - low sodium heart healthy    Complete by:  As directed      Diet general    Complete by:  As directed      Discharge instructions    Complete by:  As directed   Take medications as prescribed. Follow-up with Dr. Margo Common who can refer you to endocrinologist-diabetes doctor, Dr. Fransico Him.     Increase activity slowly    Complete by:  As directed           Current Discharge Medication List    START taking these medications   Details  amoxicillin-clavulanate (AUGMENTIN) 875-125 MG per tablet Take 1 tablet by mouth 2 (two) times daily. Starting tomorrow, take antibiotic as prescribed until completion for treatment of infection. Qty: 8 tablet, Refills: 0    fluconazole (DIFLUCAN) 100 MG tablet Take 1 tablet (100 mg total) by mouth daily. Starting tomorrow, take 1 tablet daily for the next 3 days for the yeast infection. Qty: 3 tablet, Refills: 0    insulin aspart (NOVOLOG) 100 UNIT/ML injection SLIDING SCALE 3 TIMES DAILY WITH MEALS: For blood sugar 70-120-no insulin. Blood sugar 121-150 take 5 units. Blood sugar 151-208 take 8 units. Blood sugar 201-250 take 12 units. Blood sugar 251-300 take 17 units. Blood sugar 301-350 take 20 units. Blood sugar 351-400 take 25 units. Blood sugar greater than 400 take 28 units and call your doctor. Qty: 10 mL, Refills: 11      CONTINUE these medications which have CHANGED   Details  Insulin Glargine (LANTUS SOLOSTAR) 100 UNIT/ML Solostar Pen Inject 50 Units into the skin at bedtime. Qty: 1 pen, Refills: 2      CONTINUE these medications which have NOT CHANGED   Details  ibuprofen (ADVIL,MOTRIN) 200 MG tablet Take 400 mg by mouth every 6 (six) hours as needed for mild pain or moderate pain.       Allergies  Allergen Reactions  . Latex Itching, Swelling and Rash  . Sulfa Antibiotics Nausea And Vomiting and Rash   Follow-up Information     Follow up with TAPPER,DAVID B, MD On 01/16/2015.   Specialty:  Family Medicine   Why:  AT 3:15 PM   Contact information:   [address] [place]ae[place] Gathers [place] [address] [phone]        The results of significant diagnostics from this hospitalization (including imaging, microbiology, ancillary and laboratory) are listed below for reference.    Significant Diagnostic Studies: No results found.  Microbiology: Recent Results (from the past 240 hour(s))  Culture, blood (routine x 2)     Status: None (Preliminary result)   Collection Time: 12/29/14  5:49 PM  Result Value Ref Range Status   Specimen Description BLOOD RIGHT ANTECUBITAL  Final   Special Requests BOTTLES DRAWN AEROBIC AND ANAEROBIC 6CC  Final   Culture NO GROWTH 3 DAYS  Final   Report Status PENDING  Incomplete  Culture, blood (routine x 2)     Status: None (Preliminary result)   Collection Time: 12/29/14  8:21 PM  Result Value Ref Range Status   Specimen Description BLOOD RIGHT ANTECUBITAL  Final   Special Requests BOTTLES DRAWN AEROBIC AND ANAEROBIC 6CC  Final   Culture NO GROWTH 3 DAYS  Final   Report Status PENDING  Incomplete  Labs: Basic Metabolic Panel:  Recent Labs Lab 12/29/14 1749 12/30/14 0530 12/31/14 0602 01/02/15 0628  NA 133* 134* 136 137  K 4.2 3.8 3.6 3.7  CL 101 102 104 104  CO2 GLUCOSE 362* 315* 288* 192*  BUN CREATININE 0.66 0.61 0.64 0.58  CALCIUM 9.2 8.2* 8.3* 8.2*   Liver Function Tests:  Recent Labs Lab 12/30/14 0530  AST 11*  ALT 18  ALKPHOS 66  BILITOT 1.3*  PROT 6.7  ALBUMIN 3.0*   No results for input(s): LIPASE, AMYLASE in the last 168 hours. No results for input(s): AMMONIA in the last 168 hours. CBC:  Recent Labs Lab 12/29/14 1749 12/30/14 0530 12/31/14 0602 01/02/15 0628  WBC 8.8 6.4 6.8 5.9  NEUTROABS 5.9  --   --   --   HGB 14.1 12.0 12.4 11.6*  HCT 42.4 36.9 37.9 36.3  MCV 84.8 85.6 85.7 86.2  PLT 218 149* 162 163    Cardiac Enzymes: No results for input(s): CKTOTAL, CKMB, CKMBINDEX, TROPONINI in the last 168 hours. BNP: BNP (last 3 results) No results for input(s): BNP in the last 8760 hours.  ProBNP (last 3 results) No results for input(s): PROBNP in the last 8760 hours.  CBG:  Recent Labs Lab 01/01/15 1130 01/01/15 1632 01/01/15 2203 01/02/15 0723 01/02/15 1104  GLUCAP 243* 158* 167* 164* 179*       Signed:  Tamim Skog  [facility] 01/02/2015, 12:16 PM

## 2015-01-02 NOTE — Progress Notes (Signed)
Pt's IV catheter removed and intact. Pt's IV site clean dry and intact. Discharge instructions, medications and follow up appointments reviewed and discussed with patient. All questions were answered and no further questions at this time. Pt in stable condition and in no acute distress at time of discharge. Pt escorted by nurse tech.  

## 2015-01-02 NOTE — Care Management Note (Signed)
Case Management Note  Patient Details  Name: SKARLETTE WIETECHA MRN: [id number] Date of Birth: [birth year]  Subjective/Objective:                    Action/Plan:   Expected Discharge Date:  01/02/15               Expected Discharge Plan:  Home/Self Care  In-House Referral:  NA  Discharge planning Services  CM Consult  Post Acute Care Choice:  NA Choice offered to:  NA  DME Arranged:    DME Agency:     HH Arranged:    HH Agency:     Status of Service:  Completed, signed off  Medicare Important Message Given:  No Date Medicare IM Given:    Medicare IM give by:    Date Additional Medicare IM Given:    Additional Medicare Important Message give by:     If discussed at Long Length of Stay Meetings, dates discussed:    Additional Comments: Pt discharged home today. No CM needs noted. Arlyss Queen [place], RN 01/02/2015, 12:36 PM

## 2015-01-05 ENCOUNTER — Other Ambulatory Visit: Payer: Self-pay

## 2015-01-05 LAB — CULTURE, BLOOD (ROUTINE X 2)
Culture: NO GROWTH
Culture: NO GROWTH

## 2015-06-06 ENCOUNTER — Emergency Department (HOSPITAL_COMMUNITY): Payer: Medicaid Other

## 2015-06-06 ENCOUNTER — Encounter (HOSPITAL_COMMUNITY): Payer: Self-pay | Admitting: *Deleted

## 2015-06-06 ENCOUNTER — Emergency Department (HOSPITAL_COMMUNITY)
Admission: EM | Admit: 2015-06-06 | Discharge: 2015-06-06 | Disposition: A | Discharge status: Home / Self Care | Payer: Medicaid Other | Attending: Emergency Medicine | Admitting: Emergency Medicine

## 2015-06-06 DIAGNOSIS — S80211A Abrasion, right knee, initial encounter: Secondary | ICD-10-CM | POA: Insufficient documentation

## 2015-06-06 DIAGNOSIS — W010XXA Fall on same level from slipping, tripping and stumbling without subsequent striking against object, initial encounter: Secondary | ICD-10-CM | POA: Diagnosis not present

## 2015-06-06 DIAGNOSIS — Y998 Other external cause status: Secondary | ICD-10-CM | POA: Insufficient documentation

## 2015-06-06 DIAGNOSIS — Z8669 Personal history of other diseases of the nervous system and sense organs: Secondary | ICD-10-CM | POA: Diagnosis not present

## 2015-06-06 DIAGNOSIS — Z8719 Personal history of other diseases of the digestive system: Secondary | ICD-10-CM | POA: Diagnosis not present

## 2015-06-06 DIAGNOSIS — Z8679 Personal history of other diseases of the circulatory system: Secondary | ICD-10-CM | POA: Insufficient documentation

## 2015-06-06 DIAGNOSIS — Z91040 Latex allergy status: Secondary | ICD-10-CM | POA: Insufficient documentation

## 2015-06-06 DIAGNOSIS — M25561 Pain in right knee: Secondary | ICD-10-CM

## 2015-06-06 DIAGNOSIS — Y9289 Other specified places as the place of occurrence of the external cause: Secondary | ICD-10-CM | POA: Diagnosis not present

## 2015-06-06 DIAGNOSIS — Z8659 Personal history of other mental and behavioral disorders: Secondary | ICD-10-CM | POA: Insufficient documentation

## 2015-06-06 DIAGNOSIS — E119 Type 2 diabetes mellitus without complications: Secondary | ICD-10-CM | POA: Diagnosis not present

## 2015-06-06 DIAGNOSIS — Z794 Long term (current) use of insulin: Secondary | ICD-10-CM | POA: Insufficient documentation

## 2015-06-06 DIAGNOSIS — Y9389 Activity, other specified: Secondary | ICD-10-CM | POA: Insufficient documentation

## 2015-06-06 DIAGNOSIS — W19XXXA Unspecified fall, initial encounter: Secondary | ICD-10-CM

## 2015-06-06 DIAGNOSIS — S8991XA Unspecified injury of right lower leg, initial encounter: Secondary | ICD-10-CM | POA: Diagnosis present

## 2015-06-06 MED ORDER — ONDANSETRON HCL 4 MG PO TABS: 4.0000 mg | ORAL_TABLET | Freq: Four times a day (QID) | ORAL | Status: DC

## 2015-06-06 MED ORDER — IBUPROFEN 800 MG PO TABS: 800.0000 mg | ORAL_TABLET | Freq: Three times a day (TID) | ORAL | Status: DC

## 2015-06-06 MED ORDER — ONDANSETRON HCL 4 MG/2ML IJ SOLN
4.0000 mg | Freq: Once | INTRAMUSCULAR | Status: AC
  Administered 2015-06-06: 4 mg via INTRAVENOUS
  Filled 2015-06-06: qty 2

## 2015-06-06 MED ORDER — OXYCODONE-ACETAMINOPHEN 5-325 MG PO TABS: 1.0000 | ORAL_TABLET | ORAL | Status: DC | PRN

## 2015-06-06 MED ORDER — HYDROMORPHONE HCL 1 MG/ML IJ SOLN
1.0000 mg | Freq: Once | INTRAMUSCULAR | Status: AC
  Administered 2015-06-06: 1 mg via INTRAVENOUS
  Filled 2015-06-06: qty 1

## 2015-06-06 NOTE — ED Provider Notes (Signed)
CSN: [id number]     Arrival date & time 06/06/15  0865 History   First MD Initiated Contact with Patient 06/06/15 [phone]     Chief Complaint  Patient presents with  . Knee Pain     (Consider location/radiation/quality/duration/timing/severity/associated sxs/prior Treatment) HPI   Brittany [name] is a 34 y.o. morbidly obese female with DM and lymphedema, presents to the Emergency Department complaining of right knee pain after a fall that occurred last evening.  She states that she fell onto the knee after she tripped on a tree root, landed directly on the knee and reports a "twisting" was involved.  She sat in a wheelchair with the leg stationary for an hour or so afterwards watching a parade, and later in the evening the pain and stiffness increased and this morning she has pain with any movement of the knee.  She has not tried any medications or therapies for the knee.  She denies numbness or weakness, swelling, hip or back pain.    Past Medical History  Diagnosis Date  . Diabetes mellitus without complication (HCC)   . Lymphedema   . Sleep apnea     DX with sleep apnea - unable to use c-pap  . Umbilical hernia   . Depression    Past Surgical History  Procedure Laterality Date  . Cesarean section  11/09/01  . Umbilical hernia repair N/A 01/02/2013    Procedure: LAPAROSCOPIC UMBILICAL HERNIA;  Surgeon: Lodema Pilot, DO;  Location: [facility];  Service: General;  Laterality: N/A;  . Insertion of mesh N/A 01/02/2013    Procedure: INSERTION OF MESH;  Surgeon: Lodema Pilot, DO;  Location: [facility];  Service: General;  Laterality: N/A;   Family History  Problem Relation Age of Onset  . Cancer Mother     leukemia  . Diabetes Mother   . Hypertension Mother   . Hyperlipidemia Mother    Social History  Substance Use Topics  . Smoking status: Never Smoker   . Smokeless tobacco: None  . Alcohol Use: No   OB History    No data available     Review of Systems  Constitutional: Negative  for fever and chills.  Genitourinary: Negative for dysuria and difficulty urinating.  Musculoskeletal: Positive for arthralgias (right knee pain). Negative for back pain, joint swelling and neck pain.  Skin: Negative for color change and wound.  All other systems reviewed and are negative.     Allergies  Latex and Sulfa antibiotics  Home Medications   Prior to Admission medications   Medication Sig Start Date End Date Taking? Authorizing Provider  amoxicillin-clavulanate (AUGMENTIN) 875-125 MG per tablet Take 1 tablet by mouth 2 (two) times daily. Starting tomorrow, take antibiotic as prescribed until completion for treatment of infection. 01/02/15   Elliot Cousin, MD  fluconazole (DIFLUCAN) 100 MG tablet Take 1 tablet (100 mg total) by mouth daily. Starting tomorrow, take 1 tablet daily for the next 3 days for the yeast infection. 01/02/15   Elliot Cousin, MD  ibuprofen (ADVIL,MOTRIN) 200 MG tablet Take 400 mg by mouth every 6 (six) hours as needed for mild pain or moderate pain.    Historical Provider, MD  insulin aspart (NOVOLOG) 100 UNIT/ML injection SLIDING SCALE 3 TIMES DAILY WITH MEALS: For blood sugar 70-120-no insulin. Blood sugar 121-150 take 5 units. Blood sugar 151-208 take 8 units. Blood sugar 201-250 take 12 units. Blood sugar 251-300 take 17 units. Blood sugar 301-350 take 20 units. Blood sugar 351-400 take 25 units.  Blood sugar greater than 400 take 28 units and call your doctor. 01/02/15   Elliot Cousinenise Fisher, MD  Insulin Glargine (LANTUS SOLOSTAR) 100 UNIT/ML Solostar Pen Inject 50 Units into the skin at bedtime. 01/02/15   Elliot Cousinenise Fisher, MD   BP 139/77 mmHg  Pulse 87  Temp(Src) 98.1 F (36.7 C) (Oral)  Resp 20  Ht 6' (1.829 m)  Wt 238.592 kg  BMI 71.32 kg/m2  SpO2 99%   Physical Exam  Constitutional: She is oriented to person, place, and time. She appears well-developed and well-nourished. No distress.  HENT:  Head: Atraumatic.  Neck: Normal range of motion.   Cardiovascular: Normal rate, regular rhythm, normal heart sounds and intact distal pulses.   Pulmonary/Chest: Effort normal and breath sounds normal. No respiratory distress. She exhibits no tenderness.  Musculoskeletal: She exhibits tenderness. She exhibits no edema.  Diffuse ttp of the anterior right knee.  No erythema, effusion, or step-off deformity.  Patellar tendon appears intact.  Exam is limited due to body habitus.  DP pulse brisk, distal sensation intact. Calf is soft and NT.  Neurological: She is alert and oriented to person, place, and time. She exhibits normal muscle tone. Coordination normal.  Skin: Skin is warm and dry. No erythema.  Small abrasion to the lateral right knee.   Nursing note and vitals reviewed.   ED Course  Procedures (including critical care time) Labs Review Labs Reviewed - No data to display  Imaging Review Dg Knee Right Port  06/06/2015  CLINICAL DATA:  Right knee pain after fall EXAM: PORTABLE RIGHT KNEE - 1-2 VIEW COMPARISON:  None. FINDINGS: The lateral views are suboptimally obliquely positioned due to patient mobility limitations, which precludes evaluation for a joint effusion. No fracture, dislocation or suspicious focal osseous lesion. 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 IMPRESSION: See comments regarding study limitations. No fracture or malalignment in the right knee. Mild tricompartmental right knee osteoarthritis. Electronically Signed   By: Delbert PhenixJason A Poff M.D.   On: 06/06/2015 09:19   I have personally reviewed and evaluated these images and lab results as part of my medical decision-making.    MDM   Final diagnoses:  Knee pain, acute, right  Fall, initial encounter   Pt is feeling better after medications.  XR results reviewed and discussed with patient.  She has a wheelchair at home, she agrees to symptomatic tx and close orthopedic f/u if not improving.    Pt has ambulated in the dept with a steady gait. Sx's  likley related to sprain but discussed possible ligamentous injury as well.  Pt verbalized understanding of importance of f/u if not improving.       Pauline Ausammy Juanmiguel Defelice, PA-C 06/07/15 1032  Doug SouSam Jacubowitz, MD 06/07/15 1702

## 2015-06-06 NOTE — ED Notes (Signed)
Right knee pain after tripping and falling last night.

## 2015-06-06 NOTE — ED Notes (Signed)
Patient ambulated in hallway with stedy, tolerated well. EDPa made aware.

## 2015-06-06 NOTE — Discharge Instructions (Signed)
Knee Pain  Knee pain is a common problem. It can have many causes. The pain often goes away by following your doctor's home care instructions. Treatment for ongoing pain will depend on the cause of your pain. If your knee pain continues, more tests may be needed to diagnose your condition. Tests may include X-rays or other imaging studies of your knee.  HOME CARE   Take medicines only as told by your doctor.   Rest your knee and keep it raised (elevated) while you are resting.   Do not do things that cause pain or make your pain worse.   Avoid activities where both feet leave the ground at the same time, such as running, jumping rope, or doing jumping jacks.   Apply ice to the knee area:    Put ice in a plastic bag.    Place a towel between your skin and the bag.    Leave the ice on for 20 minutes, 2-3 times a day.   Ask your doctor if you should wear an elastic knee support.   Sleep with a pillow under your knee.   Lose weight if you are overweight. Being overweight can make your knee hurt more.   Do not use any tobacco products, including cigarettes, chewing tobacco, or electronic cigarettes. If you need help quitting, ask your doctor. Smoking may slow the healing of any bone and joint problems that you may have.  GET HELP IF:   Your knee pain does not stop, it changes, or it gets worse.   You have a fever along with knee pain.   Your knee gives out or locks up.   Your knee becomes more swollen.  GET HELP RIGHT AWAY IF:    Your knee feels hot to the touch.   You have chest pain or trouble breathing.     This information is not intended to replace advice given to you by your health care provider. Make sure you discuss any questions you have with your health care provider.     Document Released: 09/23/2008 Document Revised: 07/18/2014 Document Reviewed: 08/28/2013  Elsevier Interactive Patient Education 2016 [employer]

## 2015-07-10 ENCOUNTER — Encounter (HOSPITAL_COMMUNITY): Payer: Self-pay | Admitting: Emergency Medicine

## 2015-07-10 ENCOUNTER — Emergency Department (HOSPITAL_COMMUNITY)
Admission: EM | Admit: 2015-07-10 | Discharge: 2015-07-10 | Disposition: A | Discharge status: Home / Self Care | Payer: Medicaid Other | Attending: Emergency Medicine | Admitting: Emergency Medicine

## 2015-07-10 DIAGNOSIS — Z791 Long term (current) use of non-steroidal anti-inflammatories (NSAID): Secondary | ICD-10-CM | POA: Diagnosis not present

## 2015-07-10 DIAGNOSIS — E119 Type 2 diabetes mellitus without complications: Secondary | ICD-10-CM | POA: Insufficient documentation

## 2015-07-10 DIAGNOSIS — Z8659 Personal history of other mental and behavioral disorders: Secondary | ICD-10-CM | POA: Insufficient documentation

## 2015-07-10 DIAGNOSIS — Z792 Long term (current) use of antibiotics: Secondary | ICD-10-CM | POA: Insufficient documentation

## 2015-07-10 DIAGNOSIS — Z8669 Personal history of other diseases of the nervous system and sense organs: Secondary | ICD-10-CM | POA: Insufficient documentation

## 2015-07-10 DIAGNOSIS — Z79899 Other long term (current) drug therapy: Secondary | ICD-10-CM | POA: Diagnosis not present

## 2015-07-10 DIAGNOSIS — K029 Dental caries, unspecified: Secondary | ICD-10-CM | POA: Insufficient documentation

## 2015-07-10 DIAGNOSIS — K0889 Other specified disorders of teeth and supporting structures: Secondary | ICD-10-CM | POA: Diagnosis present

## 2015-07-10 DIAGNOSIS — R51 Headache: Secondary | ICD-10-CM | POA: Insufficient documentation

## 2015-07-10 DIAGNOSIS — Z91040 Latex allergy status: Secondary | ICD-10-CM | POA: Insufficient documentation

## 2015-07-10 DIAGNOSIS — Z794 Long term (current) use of insulin: Secondary | ICD-10-CM | POA: Diagnosis not present

## 2015-07-10 MED ORDER — IBUPROFEN 800 MG PO TABS: 800.0000 mg | ORAL_TABLET | Freq: Three times a day (TID) | ORAL | Status: DC

## 2015-07-10 MED ORDER — ACETAMINOPHEN 325 MG PO TABS
650.0000 mg | ORAL_TABLET | Freq: Once | ORAL | Status: AC
  Administered 2015-07-10: 650 mg via ORAL
  Filled 2015-07-10: qty 2

## 2015-07-10 MED ORDER — CEFTRIAXONE SODIUM 1 G IJ SOLR
1.0000 g | Freq: Once | INTRAMUSCULAR | Status: AC
  Administered 2015-07-10: 1 g via INTRAMUSCULAR
  Filled 2015-07-10: qty 10

## 2015-07-10 MED ORDER — IBUPROFEN 800 MG PO TABS
800.0000 mg | ORAL_TABLET | Freq: Once | ORAL | Status: AC
  Administered 2015-07-10: 800 mg via ORAL
  Filled 2015-07-10: qty 1

## 2015-07-10 MED ORDER — AMOXICILLIN 500 MG PO CAPS: 500.0000 mg | ORAL_CAPSULE | Freq: Three times a day (TID) | ORAL | Status: DC

## 2015-07-10 MED ORDER — LIDOCAINE HCL (PF) 1 % IJ SOLN
INTRAMUSCULAR | Status: AC
Start: 2015-07-10 — End: 2015-07-10
  Administered 2015-07-10: 17:00:00
  Filled 2015-07-10: qty 5

## 2015-07-10 NOTE — Discharge Instructions (Signed)
Dental Caries °Dental caries is tooth decay. This decay can cause a hole in teeth (cavity) that can get bigger and deeper over time. °HOME CARE °· Brush and floss your teeth. Do this at least two times a day. °· Use a fluoride toothpaste. °· Use a mouth rinse if told by your dentist or doctor. °· Eat less sugary and starchy foods. Drink less sugary drinks. °· Avoid snacking often on sugary and starchy foods. Avoid sipping often on sugary drinks. °· Keep regular checkups and cleanings with your dentist. °· Use fluoride supplements if told by your dentist or doctor. °· Allow fluoride to be applied to teeth if told by your dentist or doctor. °  °This information is not intended to replace advice given to you by your health care provider. Make sure you discuss any questions you have with your health care provider. °  °Document Released: 04/05/2008 Document Revised: 07/18/2014 Document Reviewed: 06/29/2012 °Elsevier Interactive Patient Education ©2016 [employer] Inc[employer] ° °

## 2015-07-10 NOTE — ED Provider Notes (Signed)
CSN: [id number]     Arrival date & time 07/10/15  1558 History   First MD Initiated Contact with Patient 07/10/15 1633     Chief Complaint  Patient presents with  . Dental Pain     (Consider location/radiation/quality/duration/timing/severity/associated sxs/prior Treatment) Patient is a 34 y.o. female presenting with tooth pain. The history is provided by the patient.  Dental Pain Location:  Lower Severity:  Moderate Onset quality:  Gradual Duration:  1 day Timing:  Intermittent Progression:  Worsening Chronicity:  Chronic Context: dental caries and poor dentition   Relieved by:  Nothing Ineffective treatments:  NSAIDs Associated symptoms: facial pain   Associated symptoms: no drooling and no trismus   Risk factors: diabetes and lack of dental care     Past Medical History  Diagnosis Date  . Diabetes mellitus without complication (HCC)   . Lymphedema   . Sleep apnea     DX with sleep apnea - unable to use c-pap  . Umbilical hernia   . Depression    Past Surgical History  Procedure Laterality Date  . Cesarean section  11/09/01  . Umbilical hernia repair N/A 01/02/2013    Procedure: LAPAROSCOPIC UMBILICAL HERNIA;  Surgeon: Lodema PilotBrian Layton, DO;  Location: [facility];  Service: General;  Laterality: N/A;  . Insertion of mesh N/A 01/02/2013    Procedure: INSERTION OF MESH;  Surgeon: Lodema PilotBrian Layton, DO;  Location: [facility];  Service: General;  Laterality: N/A;   Family History  Problem Relation Age of Onset  . Cancer Mother     leukemia  . Diabetes Mother   . Hypertension Mother   . Hyperlipidemia Mother    Social History  Substance Use Topics  . Smoking status: Never Smoker   . Smokeless tobacco: None  . Alcohol Use: No   OB History    No data available     Review of Systems  HENT: Positive for dental problem. Negative for drooling.   All other systems reviewed and are negative.     Allergies  Latex and Sulfa antibiotics  Home Medications   Prior to Admission  medications   Medication Sig Start Date End Date Taking? Authorizing Provider  amoxicillin-clavulanate (AUGMENTIN) 875-125 MG per tablet Take 1 tablet by mouth 2 (two) times daily. Starting tomorrow, take antibiotic as prescribed until completion for treatment of infection. 01/02/15   Elliot Cousinenise Fisher, MD  fluconazole (DIFLUCAN) 100 MG tablet Take 1 tablet (100 mg total) by mouth daily. Starting tomorrow, take 1 tablet daily for the next 3 days for the yeast infection. 01/02/15   Elliot Cousinenise Fisher, MD  ibuprofen (ADVIL,MOTRIN) 800 MG tablet Take 1 tablet (800 mg total) by mouth 3 (three) times daily. 06/06/15   Tammy Triplett, PA-C  insulin aspart (NOVOLOG) 100 UNIT/ML injection SLIDING SCALE 3 TIMES DAILY WITH MEALS: For blood sugar 70-120-no insulin. Blood sugar 121-150 take 5 units. Blood sugar 151-208 take 8 units. Blood sugar 201-250 take 12 units. Blood sugar 251-300 take 17 units. Blood sugar 301-350 take 20 units. Blood sugar 351-400 take 25 units. Blood sugar greater than 400 take 28 units and call your doctor. 01/02/15   Elliot Cousinenise Fisher, MD  Insulin Glargine (LANTUS SOLOSTAR) 100 UNIT/ML Solostar Pen Inject 50 Units into the skin at bedtime. 01/02/15   Elliot Cousinenise Fisher, MD  ondansetron (ZOFRAN) 4 MG tablet Take 1 tablet (4 mg total) by mouth every 6 (six) hours. 06/06/15   Tammy Triplett, PA-C  oxyCODONE-acetaminophen (PERCOCET/ROXICET) 5-325 MG tablet Take 1 tablet by mouth every  4 (four) hours as needed. 06/06/15   Tammy Triplett, PA-C   BP 134/98 mmHg  Pulse 86  Temp(Src) 97.6 F (36.4 C) (Oral)  Resp 16  Ht  (1.803 m)  Wt 263.086 kg  BMI 80.93 kg/m2  SpO2 97% Physical Exam  Constitutional: She is oriented to person, place, and time. She appears well-developed and well-nourished.  Non-toxic appearance.  HENT:  Head: Normocephalic.  Right Ear: Tympanic membrane and external ear normal.  Left Ear: Tympanic membrane and external ear normal.  Generalized poor dentition. To deep cavities of  the right lower jaw area. Some mild-to-moderate swelling of the lower jaw, no visible abscess noted. The airway is patent. There is no swelling under the tongue.  Eyes: EOM and lids are normal. Pupils are equal, round, and reactive to light.  Neck: Normal range of motion. Neck supple. Carotid bruit is not present.  Cardiovascular: Normal rate, regular rhythm, normal heart sounds, intact distal pulses and normal pulses.   Pulmonary/Chest: Breath sounds normal. No respiratory distress.  Abdominal: Soft. Bowel sounds are normal. There is no tenderness. There is no guarding.  Musculoskeletal: Normal range of motion.  Lymphadenopathy:       Head (right side): No submandibular adenopathy present.       Head (left side): No submandibular adenopathy present.    She has no cervical adenopathy.  Neurological: She is alert and oriented to person, place, and time. She has normal strength. No cranial nerve deficit or sensory deficit.  Skin: Skin is warm and dry.  Psychiatric: She has a normal mood and affect. Her speech is normal.  Nursing note and vitals reviewed.   ED Course  Procedures (including critical care time) Labs Review Labs Reviewed - No data to display  Imaging Review No results found. I have personally reviewed and evaluated these images and lab results as part of my medical decision-making.   EKG Interpretation None      MDM  Vital signs are well within normal limits. The examination is negative for signs of Luwig's Angina. The airway is patent. The speech is clear and understandable.  The plan at this time is recommendation to the patient see a dentist as sone as possible. I discussed with her the importance of maintaining control of infection with her diabetes status. The patient was treated in the emergency department with intramuscular Rocephin. Prescription for Amoxil and ibuprofen given to the patient. Patient is in agreement with this discharge plan.    Final diagnoses:   Dental caries    *I have reviewed nursing notes, vital signs, and all appropriate lab and imaging results for this patient.[address], PA-C 07/10/15 1709  Azalia Bilis, MD 07/10/15 [phone]

## 2015-07-10 NOTE — ED Notes (Signed)
Patient complaining of right lower dental and jaw pain since last night.

## 2015-08-31 ENCOUNTER — Ambulatory Visit: Payer: Self-pay | Admitting: "Endocrinology

## 2015-09-09 ENCOUNTER — Ambulatory Visit: Payer: Self-pay | Admitting: "Endocrinology

## 2015-09-11 ENCOUNTER — Emergency Department (HOSPITAL_COMMUNITY)
Admission: EM | Admit: 2015-09-11 | Discharge: 2015-09-11 | Disposition: A | Discharge status: Home / Self Care | Payer: Medicaid Other | Attending: Emergency Medicine | Admitting: Emergency Medicine

## 2015-09-11 ENCOUNTER — Encounter (HOSPITAL_COMMUNITY): Payer: Self-pay

## 2015-09-11 DIAGNOSIS — R600 Localized edema: Secondary | ICD-10-CM | POA: Diagnosis not present

## 2015-09-11 DIAGNOSIS — Z9889 Other specified postprocedural states: Secondary | ICD-10-CM | POA: Diagnosis not present

## 2015-09-11 DIAGNOSIS — Z8659 Personal history of other mental and behavioral disorders: Secondary | ICD-10-CM | POA: Insufficient documentation

## 2015-09-11 DIAGNOSIS — R358 Other polyuria: Secondary | ICD-10-CM | POA: Diagnosis not present

## 2015-09-11 DIAGNOSIS — Z91040 Latex allergy status: Secondary | ICD-10-CM | POA: Insufficient documentation

## 2015-09-11 DIAGNOSIS — Z791 Long term (current) use of non-steroidal anti-inflammatories (NSAID): Secondary | ICD-10-CM | POA: Diagnosis not present

## 2015-09-11 DIAGNOSIS — Z8669 Personal history of other diseases of the nervous system and sense organs: Secondary | ICD-10-CM | POA: Insufficient documentation

## 2015-09-11 DIAGNOSIS — Z794 Long term (current) use of insulin: Secondary | ICD-10-CM | POA: Diagnosis not present

## 2015-09-11 DIAGNOSIS — Z8679 Personal history of other diseases of the circulatory system: Secondary | ICD-10-CM | POA: Insufficient documentation

## 2015-09-11 DIAGNOSIS — R109 Unspecified abdominal pain: Secondary | ICD-10-CM | POA: Insufficient documentation

## 2015-09-11 DIAGNOSIS — Z8719 Personal history of other diseases of the digestive system: Secondary | ICD-10-CM | POA: Diagnosis not present

## 2015-09-11 DIAGNOSIS — Z792 Long term (current) use of antibiotics: Secondary | ICD-10-CM | POA: Diagnosis not present

## 2015-09-11 DIAGNOSIS — E1165 Type 2 diabetes mellitus with hyperglycemia: Secondary | ICD-10-CM | POA: Diagnosis not present

## 2015-09-11 DIAGNOSIS — Z3202 Encounter for pregnancy test, result negative: Secondary | ICD-10-CM | POA: Insufficient documentation

## 2015-09-11 LAB — CBC WITH DIFFERENTIAL/PLATELET
Basophils Absolute: 0 K/uL (ref 0.0–0.1)
Basophils Relative: 0 %
Eosinophils Absolute: 0.2 K/uL (ref 0.0–0.7)
Eosinophils Relative: 4 %
HCT: 42.8 % (ref 36.0–46.0)
Hemoglobin: 14 g/dL (ref 12.0–15.0)
Lymphocytes Relative: 36 %
Lymphs Abs: 2.1 K/uL (ref 0.7–4.0)
MCH: 28.4 pg (ref 26.0–34.0)
MCHC: 32.7 g/dL (ref 30.0–36.0)
MCV: 86.8 fL (ref 78.0–100.0)
Monocytes Absolute: 0.2 K/uL (ref 0.1–1.0)
Monocytes Relative: 3 %
Neutro Abs: 3.4 K/uL (ref 1.7–7.7)
Neutrophils Relative %: 57 %
Platelets: 173 K/uL (ref 150–400)
RBC: 4.93 MIL/uL (ref 3.87–5.11)
RDW: 13.6 % (ref 11.5–15.5)
WBC: 5.9 K/uL (ref 4.0–10.5)

## 2015-09-11 LAB — URINALYSIS, ROUTINE W REFLEX MICROSCOPIC
Bilirubin Urine: NEGATIVE
Glucose, UA: >1000 mg/dL — AB
Hgb urine dipstick: NEGATIVE
Ketones, ur: NEGATIVE mg/dL
Leukocytes, UA: NEGATIVE
Nitrite: NEGATIVE
Protein, ur: NEGATIVE mg/dL
Specific Gravity, Urine: 1.015 (ref 1.005–1.030)
pH: 5 (ref 5.0–8.0)

## 2015-09-11 LAB — BASIC METABOLIC PANEL WITH GFR
Anion gap: 8 (ref 5–15)
BUN: 14 mg/dL (ref 6–20)
CO2: 24 mmol/L (ref 22–32)
Calcium: 8.9 mg/dL (ref 8.9–10.3)
Chloride: 103 mmol/L (ref 101–111)
Creatinine, Ser: 0.72 mg/dL (ref 0.44–1.00)
GFR calc Af Amer: >60 mL/min
GFR calc non Af Amer: >60 mL/min
Glucose, Bld: 467 mg/dL — ABNORMAL HIGH (ref 65–99)
Potassium: 4.2 mmol/L (ref 3.5–5.1)
Sodium: 135 mmol/L (ref 135–145)

## 2015-09-11 LAB — URINE MICROSCOPIC-ADD ON

## 2015-09-11 LAB — PREGNANCY, URINE: Preg Test, Ur: NEGATIVE

## 2015-09-11 MED ORDER — HYDROCODONE-IBUPROFEN 7.5-200 MG PO TABS: 1.0000 | ORAL_TABLET | Freq: Four times a day (QID) | ORAL | Status: DC | PRN

## 2015-09-11 MED ORDER — HYDROMORPHONE HCL 1 MG/ML IJ SOLN
1.0000 mg | Freq: Once | INTRAMUSCULAR | Status: AC
Start: 2015-09-11 — End: 2015-09-11
  Administered 2015-09-11: 1 mg via INTRAMUSCULAR
  Filled 2015-09-11: qty 1

## 2015-09-11 MED ORDER — FLUCONAZOLE 200 MG PO TABS
200.0000 mg | ORAL_TABLET | Freq: Every day | ORAL | Status: AC
Start: 2015-09-11 — End: 2015-09-18

## 2015-09-11 MED ORDER — MORPHINE SULFATE (PF) 4 MG/ML IV SOLN
8.0000 mg | Freq: Once | INTRAVENOUS | Status: DC
  Filled 2015-09-11: qty 2

## 2015-09-11 NOTE — ED Notes (Signed)
Pt states that she usually has a reaction of anxiety to morphine, requesting something else, Dr Jeraldine LootsLockwood notified, additional orders given,

## 2015-09-11 NOTE — ED Notes (Signed)
Dr Lockwood at bedside,  

## 2015-09-11 NOTE — Discharge Instructions (Signed)
As discussed, your evaluation today has been largely reassuring.  But, it is important that you monitor your condition carefully, and do not hesitate to return to the ED if you develop new, or concerning changes in your condition. ? ?Otherwise, please follow-up with your physician for appropriate ongoing care. ? ?

## 2015-09-11 NOTE — ED Notes (Signed)
Pt presents to er with c/o left flank pain that is worse with movement that started yesterday,  pt also c/o swelling and itching to vaginal area that started a week ago, pt states that she did take medication for a yeast infection which made the itching better but it returned, denies any pain with urination, does report increase in urination that has been over the past few days, pt reports that she has been having problems with her blood sugar and thinks that the increase in urination may be related to elevated blood sugar, pt reports that she is to see Dr Fransico HimNida, does not know what her blood sugar is today, has not checked it in several days,

## 2015-09-11 NOTE — ED Notes (Signed)
Left lower back pain. Started yesterday and it went away. Earlier today there was a light pain, and 15 minutes ago it started hurting and I can't get comfortable. Patient holding left flank pain.  Having swelling and itching in my private parts.

## 2015-09-11 NOTE — ED Notes (Signed)
Pt and family updated, warm blanket provided,

## 2015-09-11 NOTE — ED Provider Notes (Signed)
CSN: [id number]     Arrival date & time 09/11/15  1912 History   First MD Initiated Contact with Patient 09/11/15 1930     Chief Complaint  Patient presents with  . Flank Pain     (Consider location/radiation/quality/duration/timing/severity/associated sxs/prior Treatment) HPI Patient presents with concern of left flank pain. Symptoms began yesterday, with a brief episode of pain focally in the left flank. Since onset symptoms have been waxing, waning, but today, about one hour ago, the symptoms returned, with severity. Pain is nonradiating, sharp. There is associated polyuria, but no vomiting, diarrhea. Patient complains of dysuria. She denies history of kidney stone, pyelonephritis. She acknowledges history of diabetes, lymphedema. No medication taken for pain control.  Past Medical History  Diagnosis Date  . Diabetes mellitus without complication (HCC)   . Lymphedema   . Sleep apnea     DX with sleep apnea - unable to use c-pap  . Umbilical hernia   . Depression    Past Surgical History  Procedure Laterality Date  . Cesarean section  11/09/01  . Umbilical hernia repair N/A 01/02/2013    Procedure: LAPAROSCOPIC UMBILICAL HERNIA;  Surgeon: Lodema Pilot, DO;  Location: [facility];  Service: General;  Laterality: N/A;  . Insertion of mesh N/A 01/02/2013    Procedure: INSERTION OF MESH;  Surgeon: Lodema Pilot, DO;  Location: [facility];  Service: General;  Laterality: N/A;   Family History  Problem Relation Age of Onset  . Cancer Mother     leukemia  . Diabetes Mother   . Hypertension Mother   . Hyperlipidemia Mother    Social History  Substance Use Topics  . Smoking status: Never Smoker   . Smokeless tobacco: None  . Alcohol Use: No   OB History    No data available     Review of Systems  Constitutional:       Per HPI, otherwise negative  HENT:       Per HPI, otherwise negative  Respiratory:       Per HPI, otherwise negative  Cardiovascular:       Per HPI, otherwise  negative  Gastrointestinal: Negative for vomiting.  Endocrine:       Negative aside from HPI  Genitourinary:       Neg aside from HPI   Musculoskeletal:       Per HPI, otherwise negative  Skin: Negative.   Neurological: Negative for syncope.      Allergies  Latex and Sulfa antibiotics  Home Medications   Prior to Admission medications   Medication Sig Start Date End Date Taking? Authorizing Provider  amoxicillin (AMOXIL) 500 MG capsule Take 1 capsule (500 mg total) by mouth 3 (three) times daily. 07/10/15   Ivery Quale, PA-C  amoxicillin-clavulanate (AUGMENTIN) 875-125 MG per tablet Take 1 tablet by mouth 2 (two) times daily. Starting tomorrow, take antibiotic as prescribed until completion for treatment of infection. 01/02/15   Elliot Cousin, MD  fluconazole (DIFLUCAN) 100 MG tablet Take 1 tablet (100 mg total) by mouth daily. Starting tomorrow, take 1 tablet daily for the next 3 days for the yeast infection. 01/02/15   Elliot Cousin, MD  ibuprofen (ADVIL,MOTRIN) 800 MG tablet Take 1 tablet (800 mg total) by mouth 3 (three) times daily. 07/10/15   Ivery Quale, PA-C  insulin aspart (NOVOLOG) 100 UNIT/ML injection SLIDING SCALE 3 TIMES DAILY WITH MEALS: For blood sugar 70-120-no insulin. Blood sugar 121-150 take 5 units. Blood sugar 151-208 take 8 units. Blood sugar 201-250 take  12 units. Blood sugar 251-300 take 17 units. Blood sugar 301-350 take 20 units. Blood sugar 351-400 take 25 units. Blood sugar greater than 400 take 28 units and call your doctor. 01/02/15   Elliot Cousinenise Fisher, MD  Insulin Glargine (LANTUS SOLOSTAR) 100 UNIT/ML Solostar Pen Inject 50 Units into the skin at bedtime. 01/02/15   Elliot Cousinenise Fisher, MD  ondansetron (ZOFRAN) 4 MG tablet Take 1 tablet (4 mg total) by mouth every 6 (six) hours. 06/06/15   Tammy Triplett, PA-C  oxyCODONE-acetaminophen (PERCOCET/ROXICET) 5-325 MG tablet Take 1 tablet by mouth every 4 (four) hours as needed. 06/06/15   Tammy Triplett, PA-C   BP  147/92 mmHg  Pulse 84  Temp(Src) 97.8 F (36.6 C) (Oral)  Resp 24  Ht 5\' 11"  (1.803 m)  Wt 524 lb (237.685 kg)  BMI 73.12 kg/m2  SpO2 100% Physical Exam  Constitutional: She is oriented to person, place, and time. She appears well-developed and well-nourished. No distress.  Morbidly obese female awake, alert, interacting appropriately  HENT:  Head: Normocephalic and atraumatic.  Eyes: Conjunctivae and EOM are normal.  Cardiovascular: Normal rate and regular rhythm.   Pulmonary/Chest: Effort normal and breath sounds normal. No stridor. No respiratory distress.  Abdominal: There is no tenderness.  Musculoskeletal: She exhibits edema.  Diffuse bilateral lower extremity edema  Neurological: She is alert and oriented to person, place, and time. No cranial nerve deficit.  Skin: Skin is warm and dry.  Psychiatric: She has a normal mood and affect.  Nursing note and vitals reviewed.   ED Course  Procedures (including critical care time) Labs Review Labs Reviewed  URINALYSIS, ROUTINE W REFLEX MICROSCOPIC (NOT [facility]) - Abnormal; Notable for the following:    Glucose, UA >1000 (*)    All other components within normal limits  BASIC METABOLIC PANEL - Abnormal; Notable for the following:    Glucose, Bld 467 (*)    All other components within normal limits  URINE MICROSCOPIC-ADD ON - Abnormal; Notable for the following:    Squamous Epithelial / LPF 0-5 (*)    Bacteria, UA RARE (*)    All other components within normal limits  PREGNANCY, URINE  CBC WITH DIFFERENTIAL/PLATELET    9:36 PM Patient appears calm, states that her pain has improved. We discussed all findings, including reassuring urinalysis, though some suspicion for a passed kidney stone. Patient has hyperglycemia, but no anion gap. We discussed the need for close insulin dosing, monitoring. Patient will follow-up with primary care and urology. MDM  And female presents with flank pain. Notably, the patient is  insulin-dependent diabetic. Patient's prescription of pain, discomfort suggests kidney stone. Here, no evidence for obstruction or infection, and the patient is too large to accommodate CT scan to completely exclude the possibility of small stone. However, the patient's pain was well controlled, and she was discharged in stable condition to follow-up with urology, primary care.   Gerhard Munchobert Marilla Boddy, MD 09/11/15 2137

## 2015-09-21 ENCOUNTER — Ambulatory Visit: Payer: Self-pay | Admitting: "Endocrinology

## 2015-10-04 ENCOUNTER — Encounter (HOSPITAL_COMMUNITY): Payer: Self-pay | Admitting: Emergency Medicine

## 2015-10-04 ENCOUNTER — Emergency Department (HOSPITAL_COMMUNITY)
Admission: EM | Admit: 2015-10-04 | Discharge: 2015-10-04 | Disposition: A | Discharge status: Home / Self Care | Payer: Medicaid Other | Attending: Emergency Medicine | Admitting: Emergency Medicine

## 2015-10-04 DIAGNOSIS — F329 Major depressive disorder, single episode, unspecified: Secondary | ICD-10-CM | POA: Insufficient documentation

## 2015-10-04 DIAGNOSIS — L03116 Cellulitis of left lower limb: Secondary | ICD-10-CM | POA: Diagnosis not present

## 2015-10-04 DIAGNOSIS — E1165 Type 2 diabetes mellitus with hyperglycemia: Secondary | ICD-10-CM | POA: Insufficient documentation

## 2015-10-04 DIAGNOSIS — Z794 Long term (current) use of insulin: Secondary | ICD-10-CM | POA: Diagnosis not present

## 2015-10-04 DIAGNOSIS — R110 Nausea: Secondary | ICD-10-CM | POA: Diagnosis not present

## 2015-10-04 DIAGNOSIS — R739 Hyperglycemia, unspecified: Secondary | ICD-10-CM

## 2015-10-04 LAB — CBC WITH DIFFERENTIAL/PLATELET
BASOS ABS: 0 10*3/uL (ref 0.0–0.1)
BASOS PCT: 0 %
EOS PCT: 2 %
Eosinophils Absolute: 0.1 10*3/uL (ref 0.0–0.7)
HCT: 43.2 % (ref 36.0–46.0)
Hemoglobin: 14.4 g/dL (ref 12.0–15.0)
LYMPHS PCT: 36 %
Lymphs Abs: 2.6 10*3/uL (ref 0.7–4.0)
MCH: 28.7 pg (ref 26.0–34.0)
MCHC: 33.3 g/dL (ref 30.0–36.0)
MCV: 86.1 fL (ref 78.0–100.0)
MONO ABS: 0.3 10*3/uL (ref 0.1–1.0)
Monocytes Relative: 4 %
Neutro Abs: 4.2 10*3/uL (ref 1.7–7.7)
Neutrophils Relative %: 58 %
PLATELETS: 194 10*3/uL (ref 150–400)
RBC: 5.02 MIL/uL (ref 3.87–5.11)
RDW: 13.7 % (ref 11.5–15.5)
WBC: 7.2 10*3/uL (ref 4.0–10.5)

## 2015-10-04 LAB — URINE MICROSCOPIC-ADD ON
BACTERIA UA: NONE SEEN
RBC / HPF: NONE SEEN RBC/hpf (ref 0–5)

## 2015-10-04 LAB — URINALYSIS, ROUTINE W REFLEX MICROSCOPIC
BILIRUBIN URINE: NEGATIVE
Glucose, UA: >1000 mg/dL — AB
HGB URINE DIPSTICK: NEGATIVE
Ketones, ur: NEGATIVE mg/dL
Leukocytes, UA: NEGATIVE
Nitrite: NEGATIVE
PH: 5 (ref 5.0–8.0)
Protein, ur: NEGATIVE mg/dL
SPECIFIC GRAVITY, URINE: 1.03 (ref 1.005–1.030)

## 2015-10-04 LAB — COMPREHENSIVE METABOLIC PANEL
ALBUMIN: 3.9 g/dL (ref 3.5–5.0)
ALT: 34 U/L (ref 14–54)
AST: 27 U/L (ref 15–41)
Alkaline Phosphatase: 96 U/L (ref 38–126)
Anion gap: 9 (ref 5–15)
BUN: 14 mg/dL (ref 6–20)
CHLORIDE: 101 mmol/L (ref 101–111)
CO2: 24 mmol/L (ref 22–32)
CREATININE: 0.72 mg/dL (ref 0.44–1.00)
Calcium: 8.9 mg/dL (ref 8.9–10.3)
GFR calc Af Amer: >60 mL/min (ref >60)
GFR calc non Af Amer: >60 mL/min (ref >60)
GLUCOSE: 371 mg/dL — AB (ref 65–99)
POTASSIUM: 3.9 mmol/L (ref 3.5–5.1)
SODIUM: 134 mmol/L — AB (ref 135–145)
Total Bilirubin: 0.7 mg/dL (ref 0.3–1.2)
Total Protein: 8.1 g/dL (ref 6.5–8.1)

## 2015-10-04 LAB — CBG MONITORING, ED: GLUCOSE-CAPILLARY: 329 mg/dL — AB (ref 65–99)

## 2015-10-04 MED ORDER — DEXTROSE 5 % IV SOLN
1.0000 g | Freq: Once | INTRAVENOUS | Status: AC
  Administered 2015-10-04: 1 g via INTRAVENOUS
  Filled 2015-10-04: qty 10

## 2015-10-04 MED ORDER — VANCOMYCIN HCL IN DEXTROSE 1-5 GM/200ML-% IV SOLN
1000.0000 mg | Freq: Once | INTRAVENOUS | Status: AC
  Administered 2015-10-04: 1000 mg via INTRAVENOUS
  Filled 2015-10-04: qty 200

## 2015-10-04 MED ORDER — DOXYCYCLINE HYCLATE 100 MG PO CAPS: 100.0000 mg | ORAL_CAPSULE | Freq: Two times a day (BID) | ORAL | Status: DC

## 2015-10-04 MED ORDER — CEPHALEXIN 500 MG PO CAPS: 500.0000 mg | ORAL_CAPSULE | Freq: Four times a day (QID) | ORAL | Status: DC

## 2015-10-04 MED ORDER — OXYCODONE-ACETAMINOPHEN 5-325 MG PO TABS: 1.0000 | ORAL_TABLET | ORAL | Status: DC | PRN

## 2015-10-04 MED ORDER — SODIUM CHLORIDE 0.9 % IV BOLUS (SEPSIS)
1000.0000 mL | Freq: Once | INTRAVENOUS | Status: AC
  Administered 2015-10-04: 1000 mL via INTRAVENOUS

## 2015-10-04 NOTE — Discharge Instructions (Signed)
Cellulitis Cellulitis is an infection of the skin and the tissue beneath it. The infected area is usually red and tender. Cellulitis occurs most often in the arms and lower legs.  CAUSES  Cellulitis is caused by bacteria that enter the skin through cracks or cuts in the skin. The most common types of bacteria that cause cellulitis are staphylococci and streptococci. SIGNS AND SYMPTOMS   Redness and warmth.  Swelling.  Tenderness or pain.  Fever. DIAGNOSIS  Your health care provider can usually determine what is wrong based on a physical exam. Blood tests may also be done. TREATMENT  Treatment usually involves taking an antibiotic medicine. HOME CARE INSTRUCTIONS   Take your antibiotic medicine as directed by your health care provider. Finish the antibiotic even if you start to feel better.  Keep the infected arm or leg elevated to reduce swelling.  Apply a warm cloth to the affected area up to 4 times per day to relieve pain.  Take medicines only as directed by your health care provider.  Keep all follow-up visits as directed by your health care provider. SEEK MEDICAL CARE IF:   You notice red streaks coming from the infected area.  Your red area gets larger or turns dark in color.  Your bone or joint underneath the infected area becomes painful after the skin has healed.  Your infection returns in the same area or another area.  You notice a swollen bump in the infected area.  You develop new symptoms.  You have a fever. SEEK IMMEDIATE MEDICAL CARE IF:   You feel very sleepy.  You develop vomiting or diarrhea.  You have a general ill feeling (malaise) with muscle aches and pains.   This information is not intended to replace advice given to you by your health care provider. Make sure you discuss any questions you have with your health care provider.   Document Released: 04/06/2005 Document Revised: 03/18/2015 Document Reviewed: 09/12/2011 Elsevier Interactive  Patient Education 2016 Elsevier Inc.  Blood Glucose Monitoring, Adult Monitoring your blood glucose (also know as blood sugar) helps you to manage your diabetes. It also helps you and your health care provider monitor your diabetes and determine how well your treatment plan is working. WHY SHOULD YOU MONITOR YOUR BLOOD GLUCOSE?  It can help you understand how food, exercise, and medicine affect your blood glucose.  It allows you to know what your blood glucose is at any given moment. You can quickly tell if you are having low blood glucose (hypoglycemia) or high blood glucose (hyperglycemia).  It can help you and your health care provider know how to adjust your medicines.  It can help you understand how to manage an illness or adjust medicine for exercise. WHEN SHOULD YOU TEST? Your health care provider will help you decide how often you should check your blood glucose. This may depend on the type of diabetes you have, your diabetes control, or the types of medicines you are taking. Be sure to write down all of your blood glucose readings so that this information can be reviewed with your health care provider. See below for examples of testing times that your health care provider may suggest. Type 1 Diabetes  Test at least 2 times per day if your diabetes is well controlled, if you are using an insulin pump, or if you perform multiple daily injections.  If your diabetes is not well controlled or if you are sick, you may need to test more often.  It  is a good idea to also test:  Before every insulin injection.  Before and after exercise.  Between meals and 2 hours after a meal.  Occasionally between 2:00 a.m. and 3:00 a.m. Type 2 Diabetes  If you are taking insulin, test at least 2 times per day. However, it is best to test before every insulin injection.  If you take medicines by mouth (orally), test 2 times a day.  If you are on a controlled diet, test once a day.  If your  diabetes is not well controlled or if you are sick, you may need to monitor more often. HOW TO MONITOR YOUR BLOOD GLUCOSE Supplies Needed  Blood glucose meter.  Test strips for your meter. Each meter has its own strips. You must use the strips that go with your own meter.  A pricking needle (lancet).  A device that holds the lancet (lancing device).  A journal or log book to write down your results. Procedure  Wash your hands with soap and water. Alcohol is not preferred.  Prick the side of your finger (not the tip) with the lancet.  Gently milk the finger until a small drop of blood appears.  Follow the instructions that come with your meter for inserting the test strip, applying blood to the strip, and using your blood glucose meter. Other Areas to Get Blood for Testing Some meters allow you to use other areas of your body (other than your finger) to test your blood. These areas are called alternative sites. The most common alternative sites are:  The forearm.  The thigh.  The back area of the lower leg.  The palm of the hand. The blood flow in these areas is slower. Therefore, the blood glucose values you get may be delayed, and the numbers are different from what you would get from your fingers. Do not use alternative sites if you think you are having hypoglycemia. Your reading will not be accurate. Always use a finger if you are having hypoglycemia. Also, if you cannot feel your lows (hypoglycemia unawareness), always use your fingers for your blood glucose checks. ADDITIONAL TIPS FOR GLUCOSE MONITORING  Do not reuse lancets.  Always carry your supplies with you.  All blood glucose meters have a 24-hour "hotline" number to call if you have questions or need help.  Adjust (calibrate) your blood glucose meter with a control solution after finishing a few boxes of strips. BLOOD GLUCOSE RECORD KEEPING It is a good idea to keep a daily record or log of your blood glucose  readings. Most glucose meters, if not all, keep your glucose records stored in the meter. Some meters come with the ability to download your records to your home computer. Keeping a record of your blood glucose readings is especially helpful if you are wanting to look for patterns. Make notes to go along with the blood glucose readings because you might forget what happened at that exact time. Keeping good records helps you and your health care provider to work together to achieve good diabetes management.    This information is not intended to replace advice given to you by your health care provider. Make sure you discuss any questions you have with your health care provider.   Document Released: 06/30/2003 Document Revised: 07/18/2014 Document Reviewed: 11/19/2012 Elsevier Interactive Patient Education [employer] Elsevier Inc.

## 2015-10-04 NOTE — ED Provider Notes (Signed)
CSN: [id number]     Arrival date & time 10/04/15  0840 History   First MD Initiated Contact with Patient 10/04/15 [phone]     Chief Complaint  Patient presents with  . Hyperglycemia  . Leg Pain     (Consider location/radiation/quality/duration/timing/severity/associated sxs/prior Treatment) The history is provided by the patient and the spouse.   Brittany [name] is a 35 y.o. female with DM, lymphedema of lower extremities, morbid obesity and history of cellulitis presenting with increasingly elevated cbg's,  States she was over 500 since yesterday, but has increased sliding scale and is now trending down, but reports increased pain, redness and drainage from one of the skin folds of her left upper leg suggesting return of her cellulitis.  She denies fevers or chills, but endorses weakness and feeling lightheaded.  She denies chest pain, sob, palpitations, abdominal pain or vomiting but has had nausea.  She reports increased urinary frequency and increased thirst.    Past Medical History  Diagnosis Date  . Diabetes mellitus without complication (HCC)   . Lymphedema   . Sleep apnea     DX with sleep apnea - unable to use c-pap  . Umbilical hernia   . Depression    Past Surgical History  Procedure Laterality Date  . Cesarean section  11/09/01  . Umbilical hernia repair N/A 01/02/2013    Procedure: LAPAROSCOPIC UMBILICAL HERNIA;  Surgeon: Lodema Pilot, DO;  Location: [facility];  Service: General;  Laterality: N/A;  . Insertion of mesh N/A 01/02/2013    Procedure: INSERTION OF MESH;  Surgeon: Lodema Pilot, DO;  Location: [facility];  Service: General;  Laterality: N/A;   Family History  Problem Relation Age of Onset  . Cancer Mother     leukemia  . Diabetes Mother   . Hypertension Mother   . Hyperlipidemia Mother    Social History  Substance Use Topics  . Smoking status: Never Smoker   . Smokeless tobacco: None  . Alcohol Use: No   OB History    No data available     Review of  Systems  Constitutional: Negative for fever and chills.  HENT: Negative for congestion and sore throat.   Eyes: Negative.   Respiratory: Negative for chest tightness and shortness of breath.   Cardiovascular: Negative for chest pain and palpitations.  Gastrointestinal: Positive for nausea. Negative for vomiting and abdominal pain.  Endocrine: Positive for polydipsia and polyuria.  Genitourinary: Negative.   Musculoskeletal: Negative for joint swelling, arthralgias and neck pain.  Skin: Positive for color change. Negative for rash and wound.  Neurological: Positive for weakness and light-headedness. Negative for dizziness, numbness and headaches.  Psychiatric/Behavioral: Negative.       Allergies  Latex and Sulfa antibiotics  Home Medications   Prior to Admission medications   Medication Sig Start Date End Date Taking? Authorizing Provider  HYDROcodone-ibuprofen (VICOPROFEN) 7.5-200 MG tablet Take 1 tablet by mouth every 6 (six) hours as needed for severe pain. 09/11/15   Gerhard Munch, MD  insulin aspart (NOVOLOG) 100 UNIT/ML injection SLIDING SCALE 3 TIMES DAILY WITH MEALS: For blood sugar 70-120-no insulin. Blood sugar 121-150 take 5 units. Blood sugar 151-208 take 8 units. Blood sugar 201-250 take 12 units. Blood sugar 251-300 take 17 units. Blood sugar 301-350 take 20 units. Blood sugar 351-400 take 25 units. Blood sugar greater than 400 take 28 units and call your doctor. 01/02/15   Elliot Cousin, MD  Insulin Glargine (LANTUS SOLOSTAR) 100 UNIT/ML Solostar Pen Inject 50  Units into the skin at bedtime. 01/02/15   Elliot Cousinenise Fisher, MD   BP 157/129 mmHg  Pulse 95  Temp(Src) 97.8 F (36.6 C) (Oral)  Resp 18  Ht 5\' 11"  (1.803 m)  Wt 237.685 kg  BMI 73.12 kg/m2  SpO2 100% Physical Exam  Constitutional:  Morbid obesity.  HENT:  Head: Normocephalic and atraumatic.  Eyes: Conjunctivae are normal.  Neck: Normal range of motion.  Cardiovascular: Normal rate, regular rhythm, normal  heart sounds and intact distal pulses.   Pulmonary/Chest: Effort normal and breath sounds normal. No respiratory distress. She has no wheezes.  Abdominal: Soft. Bowel sounds are normal. There is no tenderness. There is no guarding.  Difficult exam given body habitus  Musculoskeletal: Normal range of motion.  Neurological: She is alert.  Skin: Skin is warm and dry.  Erythema and early appearing cellulitis of left leg at      Medial and posterior knee fold.  The fold is moist and foul smelling without maceration of skin or open wounds.    Psychiatric: She has a normal mood and affect.  Nursing note and vitals reviewed.   ED Course  Procedures (including critical care time) Labs Review Labs Reviewed  CBG MONITORING, ED - Abnormal; Notable for the following:    Glucose-Capillary 329 (*)    All other components within normal limits  CBC WITH DIFFERENTIAL/PLATELET  COMPREHENSIVE METABOLIC PANEL  URINALYSIS, ROUTINE W REFLEX MICROSCOPIC (NOT AT [facility])    Imaging Review No results found. I have personally reviewed and evaluated these images and lab results as part of my medical decision-making.   EKG Interpretation None      MDM   Final diagnoses:  None    Labs reviewed,  Discussed with Dr Patria Maneampos who also saw patient.  Pro and con's of inpatient vs outpt tx discussed.  Offered admission if pt prefers given prior problems with this same condition requiring inpatient tx.  States this is being caught much earlier than the last time and is desirous of home trial of oral abx.  She was placed on doxycycline and keflex.  Strict return precautions given including fevers, worse pain or spreading redness and worsened glucose control.  Advised to keep close watch and follow her sliding scale schedule as outlined by her pcp.  Prescribed oxycodone for pain relief.  Advised she needs a recheck in 2 days by her pcp or here if unable to get appt.    Burgess AmorJulie Raynor Calcaterra, PA-C 10/04/15 1124  Azalia BilisKevin Campos,  MD 10/04/15 [phone]

## 2015-10-04 NOTE — ED Notes (Signed)
Pt states she thinks she has cellulitis in her left leg again.  States it is painful and red in crease.  States her glucose gets high with cellulitis.

## 2015-11-30 ENCOUNTER — Emergency Department (HOSPITAL_COMMUNITY): Payer: Medicaid Other

## 2015-11-30 ENCOUNTER — Encounter (HOSPITAL_COMMUNITY): Payer: Self-pay | Admitting: Emergency Medicine

## 2015-11-30 ENCOUNTER — Emergency Department (HOSPITAL_COMMUNITY)
Admission: EM | Admit: 2015-11-30 | Discharge: 2015-11-30 | Disposition: A | Discharge status: Home / Self Care | Payer: Medicaid Other | Attending: Emergency Medicine | Admitting: Emergency Medicine

## 2015-11-30 DIAGNOSIS — F410 Panic disorder [episodic paroxysmal anxiety] without agoraphobia: Secondary | ICD-10-CM | POA: Insufficient documentation

## 2015-11-30 DIAGNOSIS — Z794 Long term (current) use of insulin: Secondary | ICD-10-CM | POA: Insufficient documentation

## 2015-11-30 DIAGNOSIS — E119 Type 2 diabetes mellitus without complications: Secondary | ICD-10-CM | POA: Diagnosis not present

## 2015-11-30 DIAGNOSIS — R51 Headache: Secondary | ICD-10-CM | POA: Insufficient documentation

## 2015-11-30 DIAGNOSIS — F329 Major depressive disorder, single episode, unspecified: Secondary | ICD-10-CM | POA: Diagnosis not present

## 2015-11-30 DIAGNOSIS — Z791 Long term (current) use of non-steroidal anti-inflammatories (NSAID): Secondary | ICD-10-CM | POA: Diagnosis not present

## 2015-11-30 LAB — CBC WITH DIFFERENTIAL/PLATELET
BASOS PCT: 0 %
Basophils Absolute: 0 10*3/uL (ref 0.0–0.1)
EOS ABS: 0.2 10*3/uL (ref 0.0–0.7)
Eosinophils Relative: 3 %
HCT: 43.8 % (ref 36.0–46.0)
HEMOGLOBIN: 14.6 g/dL (ref 12.0–15.0)
LYMPHS ABS: 2.1 10*3/uL (ref 0.7–4.0)
Lymphocytes Relative: 39 %
MCH: 29.1 pg (ref 26.0–34.0)
MCHC: 33.3 g/dL (ref 30.0–36.0)
MCV: 87.3 fL (ref 78.0–100.0)
Monocytes Absolute: 0.3 10*3/uL (ref 0.1–1.0)
Monocytes Relative: 5 %
NEUTROS PCT: 53 %
Neutro Abs: 2.9 10*3/uL (ref 1.7–7.7)
Platelets: 151 10*3/uL (ref 150–400)
RBC: 5.02 MIL/uL (ref 3.87–5.11)
RDW: 13.8 % (ref 11.5–15.5)
WBC: 5.4 10*3/uL (ref 4.0–10.5)

## 2015-11-30 LAB — TROPONIN I: Troponin I: <0.03 ng/mL (ref <0.031)

## 2015-11-30 LAB — I-STAT BETA HCG BLOOD, ED (MC, WL, AP ONLY): I-stat hCG, quantitative: <5 m[IU]/mL (ref <5)

## 2015-11-30 LAB — BASIC METABOLIC PANEL
ANION GAP: 10 (ref 5–15)
BUN: 9 mg/dL (ref 6–20)
CHLORIDE: 105 mmol/L (ref 101–111)
CO2: 19 mmol/L — AB (ref 22–32)
Calcium: 9.2 mg/dL (ref 8.9–10.3)
Creatinine, Ser: 0.6 mg/dL (ref 0.44–1.00)
GFR calc Af Amer: >60 mL/min (ref >60)
GFR calc non Af Amer: >60 mL/min (ref >60)
GLUCOSE: 277 mg/dL — AB (ref 65–99)
POTASSIUM: 4.3 mmol/L (ref 3.5–5.1)
Sodium: 134 mmol/L — ABNORMAL LOW (ref 135–145)

## 2015-11-30 MED ORDER — METOCLOPRAMIDE HCL 5 MG/ML IJ SOLN
10.0000 mg | Freq: Once | INTRAMUSCULAR | Status: AC
  Administered 2015-11-30: 10 mg via INTRAVENOUS
  Filled 2015-11-30: qty 2

## 2015-11-30 MED ORDER — KETOROLAC TROMETHAMINE 30 MG/ML IJ SOLN
30.0000 mg | Freq: Once | INTRAMUSCULAR | Status: AC
  Administered 2015-11-30: 30 mg via INTRAVENOUS
  Filled 2015-11-30: qty 1

## 2015-11-30 MED ORDER — DIPHENHYDRAMINE HCL 50 MG/ML IJ SOLN
25.0000 mg | Freq: Once | INTRAMUSCULAR | Status: AC
  Administered 2015-11-30: 25 mg via INTRAVENOUS
  Filled 2015-11-30: qty 1

## 2015-11-30 NOTE — ED Provider Notes (Signed)
CSN: [id number]     Arrival date & time 11/30/15  1416 History   First MD Initiated Contact with Patient 11/30/15 1854     Chief Complaint  Patient presents with  . Panic Attack     (Consider location/radiation/quality/duration/timing/severity/associated sxs/prior Treatment) HPI Comments: Morbidly obese female presenting with "panic attack" that onset around noon today. States she was arguing with her spouse about money when she began hyperventilating, had central chest pain and developed a headache similar to her previous migraines. She states she's had these symptoms before with a panic attack but this was worse. Chest pain lasted about one hour and has since resolved. Pain is in the center of her chest that did not radiate. It was associated with shortness of breath and hyperventilation which is also resolved. No cough or fever. She also complains of gradual onset diffuse headache similar to previous migraines. Denies thunderclap onset. Denies any fever. Denies any vision change. States headache is worse with light and sound. Normally takes ibuprofen at home for headaches. No focal weakness, numbness or tingling. No suicidal or homicidal thoughts.  family states she had some difficulty speaking at the time and acted like she could not get her words out.  The history is provided by the patient.    Past Medical History  Diagnosis Date  . Diabetes mellitus without complication (HCC)   . Lymphedema   . Sleep apnea     DX with sleep apnea - unable to use c-pap  . Umbilical hernia   . Depression    Past Surgical History  Procedure Laterality Date  . Cesarean section  11/09/01  . Umbilical hernia repair N/A 01/02/2013    Procedure: LAPAROSCOPIC UMBILICAL HERNIA;  Surgeon: Lodema Pilot, DO;  Location: [facility];  Service: General;  Laterality: N/A;  . Insertion of mesh N/A 01/02/2013    Procedure: INSERTION OF MESH;  Surgeon: Lodema Pilot, DO;  Location: [facility];  Service: General;  Laterality: N/A;    Family History  Problem Relation Age of Onset  . Cancer Mother     leukemia  . Diabetes Mother   . Hypertension Mother   . Hyperlipidemia Mother    Social History  Substance Use Topics  . Smoking status: Never Smoker   . Smokeless tobacco: None  . Alcohol Use: No   OB History    No data available     Review of Systems  Constitutional: Negative for fever, activity change and appetite change.  HENT: Negative for congestion.   Eyes: Positive for photophobia.  Respiratory: Positive for chest tightness. Negative for shortness of breath.   Cardiovascular: Positive for chest pain.  Gastrointestinal: Negative for nausea, vomiting, abdominal pain and rectal pain.  Genitourinary: Negative for dysuria, hematuria, vaginal bleeding and vaginal discharge.  Musculoskeletal: Negative for myalgias and arthralgias.  Neurological: Positive for headaches. Negative for dizziness, weakness, light-headedness and numbness.      Allergies  Latex and Sulfa antibiotics  Home Medications   Prior to Admission medications   Medication Sig Start Date End Date Taking? Authorizing Provider  ibuprofen (ADVIL,MOTRIN) 200 MG tablet Take 400 mg by mouth every 6 (six) hours as needed for headache or moderate pain.   Yes Historical Provider, MD  insulin aspart (NOVOLOG) 100 UNIT/ML injection SLIDING SCALE 3 TIMES DAILY WITH MEALS: For blood sugar 70-120-no insulin. Blood sugar 121-150 take 5 units. Blood sugar 151-208 take 8 units. Blood sugar 201-250 take 12 units. Blood sugar 251-300 take 17 units. Blood sugar 301-350 take  20 units. Blood sugar 351-400 take 25 units. Blood sugar greater than 400 take 28 units and call your doctor. 01/02/15  Yes Elliot Cousinenise Fisher, MD  Insulin Glargine (LANTUS SOLOSTAR) 100 UNIT/ML Solostar Pen Inject 50 Units into the skin at bedtime. 01/02/15  Yes Elliot Cousinenise Fisher, MD  cephALEXin (KEFLEX) 500 MG capsule Take 1 capsule (500 mg total) by mouth 4 (four) times daily. Patient not  taking: Reported on 11/30/2015 10/04/15   Burgess AmorJulie Idol, PA-C  doxycycline (VIBRAMYCIN) 100 MG capsule Take 1 capsule (100 mg total) by mouth 2 (two) times daily. Patient not taking: Reported on 11/30/2015 10/04/15   Burgess AmorJulie Idol, PA-C  HYDROcodone-ibuprofen (VICOPROFEN) 7.5-200 MG tablet Take 1 tablet by mouth every 6 (six) hours as needed for severe pain. Patient not taking: Reported on 10/04/2015 09/11/15   Gerhard Munchobert Lockwood, MD  oxyCODONE-acetaminophen (PERCOCET/ROXICET) 5-325 MG tablet Take 1 tablet by mouth every 4 (four) hours as needed. Patient not taking: Reported on 11/30/2015 10/04/15   Burgess AmorJulie Idol, PA-C   BP 132/78 mmHg  Pulse 76  Temp(Src) 97.8 F (36.6 C) (Oral)  Resp 16  Wt 524 lb (237.685 kg)  SpO2 98% Physical Exam  Constitutional: She is oriented to person, place, and time. She appears well-developed and well-nourished. No distress.  HENT:  Head: Normocephalic and atraumatic.  Mouth/Throat: Oropharynx is clear and moist. No oropharyngeal exudate.  Eyes: Conjunctivae and EOM are normal. Pupils are equal, round, and reactive to light.  Neck: Normal range of motion. Neck supple.  No meningismus.  Cardiovascular: Normal rate, regular rhythm, normal heart sounds and intact distal pulses.   No murmur heard. Pulmonary/Chest: Effort normal and breath sounds normal. No respiratory distress.  Abdominal: Soft. There is no tenderness. There is no rebound and no guarding.  Musculoskeletal: Normal range of motion. She exhibits edema. She exhibits no tenderness.  Chronic lymphedema of lower extremities  Neurological: She is alert and oriented to person, place, and time. No cranial nerve deficit. She exhibits normal muscle tone. Coordination normal.  No ataxia on finger to nose bilaterally. No pronator drift. 5/5 strength throughout. CN 2-12 intact.Equal grip strength. Sensation intact.    Skin: Skin is warm.  Psychiatric: She has a normal mood and affect. Her behavior is normal.  Nursing note and  vitals reviewed.   ED Course  Procedures (including critical care time) Labs Review Labs Reviewed  BASIC METABOLIC PANEL - Abnormal; Notable for the following:    Sodium 134 (*)    CO2 19 (*)    Glucose, Bld 277 (*)    All other components within normal limits  CBC WITH DIFFERENTIAL/PLATELET  TROPONIN I  I-STAT BETA HCG BLOOD, ED ([facility], [facility], [facility] ONLY)    Imaging Review Dg Chest Portable 1 View  11/30/2015  CLINICAL DATA:  Acute onset of generalized chest pain and shortness of breath. Initial encounter. EXAM: PORTABLE CHEST 1 VIEW COMPARISON:  Chest radiograph performed 10/04/2014 FINDINGS: The lungs are well-aerated. Mild vascular congestion is noted. There is no evidence of focal opacification, pleural effusion or pneumothorax. The cardiomediastinal silhouette is within normal limits. No acute osseous abnormalities are seen. IMPRESSION: Mild vascular congestion noted.  Lungs remain grossly clear. Electronically Signed   By: Roanna RaiderJeffery  Chang M.D.   On: 11/30/2015 19:36   I have personally reviewed and evaluated these images and lab results as part of my medical decision-making.   EKG Interpretation   Date/Time:  Monday Nov 30 2015 14:25:27 EDT Ventricular Rate:  84 PR Interval:  204 QRS  Duration: 96 QT Interval:  380 QTC Calculation: 449 R Axis:   19 Text Interpretation:  Normal sinus rhythm Cannot rule out Anterior infarct  , age undetermined Abnormal ECG Confirmed by Fayrene Fearing  MD, MARK ([id number]) on  11/30/2015 2:56:01 PM      MDM   Final diagnoses:  Panic attack   Episode of chest pain, headache, hyperventilation similar to previous panic attacks. No neurological deficits. Denies thunderclap onset. EKG is normal sinus rhythm without acute ST changes. No hypoxia or tachycardia. Doubt pulmonary embolism.  Headache is gradual in onset. Denies thunderclap. Patient is 524 pounds and will not be able to get a CT scan.  Neurological exam is nonfocal. Headache is gradual in onset. Doubt  subarachnoid hemorrhage. Her speech is normal.  Situation discussed with Dr. Roseanne Reno of neurology. He agrees with an inability to image patient there is no further testing that can be done to evaluate for possible TIA. However TIA seems less likely given the circumstances of patient's presentation and likely panic attack.  Her troponin is Negative 6 hours after episode of chest pain. Chest pain is resolved.  Her headache is improved with Toradol, Reglan and Benadryl. She is tolerating by mouth. She is requesting discharge. She will follow-up with her PCP. She understands that mini stroke or stroke cannot be completely ruled out without imaging but her weight precludes this.  Glynn Octave, MD 11/30/15 2312

## 2015-11-30 NOTE — ED Notes (Signed)
Per family member, patient got worried about money at noon today and had a panic attack. Patient complaining of headache and chest pain and "difficulty thinking straight." Family states patient hyperventilated at home. States this is normal symptoms for patient when she has panic attacks.

## 2015-11-30 NOTE — Discharge Instructions (Signed)
Panic Attacks We are unable to completely rule out mini stroke or stroke without imaging of your brain. Follow  Up with your doctor. Return to the ED if you develop new or worsening symptoms. Panic attacks are sudden, short-livedsurges of severe anxiety, fear, or discomfort. They may occur for no reason when you are relaxed, when you are anxious, or when you are sleeping. Panic attacks may occur for a number of reasons:   Healthy people occasionally have panic attacks in extreme, life-threatening situations, such as war or natural disasters. Normal anxiety is a protective mechanism of the body that helps us react to danger (fight or flight response).  Panic attacks are often seen with anxiety disorders, such as panic disorder, social anxiety disorder, generalized anxiety disorder, and phobias. Anxiety disorders cause excessive or uncontrollable anxiety. They may interfere with your relationships or other life activities.  Panic attacks are sometimes seen with other mental illnesses, such as depression and posttraumatic stress disorder.  Certain medical conditions, prescription medicines, and drugs of abuse can cause panic attacks. SYMPTOMS  Panic attacks start suddenly, peak within 20 minutes, and are accompanied by four or more of the following symptoms:  Pounding heart or fast heart rate (palpitations).  Sweating.  Trembling or shaking.  Shortness of breath or feeling smothered.  Feeling choked.  Chest pain or discomfort.  Nausea or strange feeling in your stomach.  Dizziness, light-headedness, or feeling like you will faint.  Chills or hot flushes.  Numbness or tingling in your lips or hands and feet.  Feeling that things are not real or feeling that you are not yourself.  Fear of losing control or going crazy.  Fear of dying. Some of these symptoms can mimic serious medical conditions. For example, you may think you are having a heart attack. Although panic attacks can be  very scary, they are not life threatening. DIAGNOSIS  Panic attacks are diagnosed through an assessment by your health care provider. Your health care provider will ask questions about your symptoms, such as where and when they occurred. Your health care provider will also ask about your medical history and use of alcohol and drugs, including prescription medicines. Your health care provider may order blood tests or other studies to rule out a serious medical condition. Your health care provider may refer you to a mental health professional for further evaluation. TREATMENT   Most healthy people who have one or two panic attacks in an extreme, life-threatening situation will not require treatment.  The treatment for panic attacks associated with anxiety disorders or other mental illness typically involves counseling with a mental health professional, medicine, or a combination of both. Your health care provider will help determine what treatment is best for you.  Panic attacks due to physical illness usually go away with treatment of the illness. If prescription medicine is causing panic attacks, talk with your health care provider about stopping the medicine, decreasing the dose, or substituting another medicine.  Panic attacks due to alcohol or drug abuse go away with abstinence. Some adults need professional help in order to stop drinking or using drugs. HOME CARE INSTRUCTIONS   Take all medicines as directed by your health care provider.   Schedule and attend follow-up visits as directed by your health care provider. It is important to keep all your appointments. SEEK MEDICAL CARE IF:  You are not able to take your medicines as prescribed.  Your symptoms do not improve or get worse. SEEK IMMEDIATE MEDICAL CARE IF:  You experience panic attack symptoms that are different than your usual symptoms.  You have serious thoughts about hurting yourself or others.  You are taking medicine  for panic attacks and have a serious side effect. MAKE SURE YOU:  Understand these instructions.  Will watch your condition.  Will get help right away if you are not doing well or get worse.   This information is not intended to replace advice given to you by your health care provider. Make sure you discuss any questions you have with your health care provider.   Document Released: 06/27/2005 Document Revised: 07/02/2013 Document Reviewed: 02/08/2013 Elsevier Interactive Patient Education [employer].

## 2016-03-27 ENCOUNTER — Emergency Department (HOSPITAL_COMMUNITY)
Admission: EM | Admit: 2016-03-27 | Discharge: 2016-03-27 | Disposition: A | Discharge status: Home / Self Care | Payer: Medicaid Other | Attending: Emergency Medicine | Admitting: Emergency Medicine

## 2016-03-27 ENCOUNTER — Encounter (HOSPITAL_COMMUNITY): Payer: Self-pay

## 2016-03-27 DIAGNOSIS — Z794 Long term (current) use of insulin: Secondary | ICD-10-CM | POA: Insufficient documentation

## 2016-03-27 DIAGNOSIS — E1165 Type 2 diabetes mellitus with hyperglycemia: Secondary | ICD-10-CM | POA: Insufficient documentation

## 2016-03-27 DIAGNOSIS — N760 Acute vaginitis: Secondary | ICD-10-CM | POA: Insufficient documentation

## 2016-03-27 DIAGNOSIS — R739 Hyperglycemia, unspecified: Secondary | ICD-10-CM

## 2016-03-27 LAB — BASIC METABOLIC PANEL
ANION GAP: 10 (ref 5–15)
BUN: 8 mg/dL (ref 6–20)
CALCIUM: 8.8 mg/dL — AB (ref 8.9–10.3)
CO2: 24 mmol/L (ref 22–32)
Chloride: 102 mmol/L (ref 101–111)
Creatinine, Ser: 0.64 mg/dL (ref 0.44–1.00)
GFR calc Af Amer: >60 mL/min (ref >60)
GLUCOSE: 316 mg/dL — AB (ref 65–99)
Potassium: 3.7 mmol/L (ref 3.5–5.1)
Sodium: 136 mmol/L (ref 135–145)

## 2016-03-27 LAB — CBC WITH DIFFERENTIAL/PLATELET
BASOS ABS: 0 10*3/uL (ref 0.0–0.1)
BASOS PCT: 0 %
EOS PCT: 2 %
Eosinophils Absolute: 0.1 10*3/uL (ref 0.0–0.7)
HCT: 40.9 % (ref 36.0–46.0)
Hemoglobin: 12.9 g/dL (ref 12.0–15.0)
Lymphocytes Relative: 38 %
Lymphs Abs: 2.1 10*3/uL (ref 0.7–4.0)
MCH: 27.9 pg (ref 26.0–34.0)
MCHC: 31.5 g/dL (ref 30.0–36.0)
MCV: 88.5 fL (ref 78.0–100.0)
MONO ABS: 0.2 10*3/uL (ref 0.1–1.0)
Monocytes Relative: 4 %
NEUTROS ABS: 3 10*3/uL (ref 1.7–7.7)
Neutrophils Relative %: 56 %
PLATELETS: 149 10*3/uL — AB (ref 150–400)
RBC: 4.62 MIL/uL (ref 3.87–5.11)
RDW: 14.1 % (ref 11.5–15.5)
WBC: 5.4 10*3/uL (ref 4.0–10.5)

## 2016-03-27 LAB — CBG MONITORING, ED
GLUCOSE-CAPILLARY: 308 mg/dL — AB (ref 65–99)
Glucose-Capillary: 288 mg/dL — ABNORMAL HIGH (ref 65–99)

## 2016-03-27 MED ORDER — INSULIN GLARGINE 100 UNIT/ML SOLOSTAR PEN: 50.0000 [IU] | PEN_INJECTOR | Freq: Every day | SUBCUTANEOUS | 0 refills | Status: DC

## 2016-03-27 MED ORDER — FLUCONAZOLE 200 MG PO TABS: 200.0000 mg | ORAL_TABLET | Freq: Every day | ORAL | 0 refills | Status: AC

## 2016-03-27 MED ORDER — INSULIN ASPART 100 UNIT/ML ~~LOC~~ SOLN
SUBCUTANEOUS | 0 refills | Status: DC
Start: 2016-03-27 — End: 2023-06-10

## 2016-03-27 MED ORDER — SODIUM CHLORIDE 0.9 % IV BOLUS (SEPSIS)
1000.0000 mL | Freq: Once | INTRAVENOUS | Status: AC
Start: 2016-03-27 — End: 2016-03-27
  Administered 2016-03-27: 1000 mL via INTRAVENOUS

## 2016-03-27 NOTE — ED Triage Notes (Signed)
Patient states that she has symptoms of a yeast infection in vaginal area.  Having itching, cottage cheese consistency discharge, and a foul odor.  Patient states that this happens when her blood sugar is usually elevated.  States that she does not have a home monitor and that she has been out of her insulin (Novolog and Lantus) for a couple of days.

## 2016-03-27 NOTE — ED Notes (Signed)
Pt states her blood sugar monitor "disappeared" from her house atleast a month ago. Pt states she has just been giving herself 20 units of Novolog and 50 units of lantus at bedtime but states she has been out of this medication for a "couple" of days.

## 2016-03-27 NOTE — ED Provider Notes (Signed)
[facility] Provider Note   CSN: [id number] Arrival date & time: 03/27/16  0045  Time Seen 01:10 AM   History   Chief Complaint Chief Complaint  Patient presents with  . Vaginitis    HPI [facility] FreezeJennifer L [name] is a 35 y.o. female.  HPI she reports she feels like her blood sugar has been high for the past 2-3 days. She states the last time she took her insulin was on September 13. She states she ran out of both of her insulins. She reports before that her monitor had broken about a month ago so she was unable to use her sliding scale. She states she's had a dry mouth in the morning for the past few days. She states today she just wanted to sleep all day. She also describes itching, burning and swelling in her groin from scratching. She's also had a cottage cheese discharge which she states happens when her blood sugars are high. She states her doctor has referred her to Dr. Fransico HimNida, however she doesn't have transportation because her husband works during the day. She states her blood sugars are usually in the 250 range.   [place]  Past Medical History:  Diagnosis Date  . Depression   . Diabetes mellitus without complication (HCC)   . Lymphedema   . Sleep apnea    DX with sleep apnea - unable to use c-pap  . Umbilical hernia     Patient Active Problem List   Diagnosis Date Noted  . Candidal intertrigo 12/31/2014  . Morbid obesity (HCC) 12/29/2014  . Hyponatremia 12/29/2014  . Cellulitis of left lower extremity 12/29/2014  . DM (diabetes mellitus), type 2, uncontrolled (HCC) 01/04/2013    Past Surgical History:  Procedure Laterality Date  . CESAREAN SECTION  11/09/01  . INSERTION OF MESH N/A 01/02/2013   Procedure: INSERTION OF MESH;  Surgeon: Lodema PilotBrian Layton, DO;  Location: [facility];  Service: General;  Laterality: N/A;  . UMBILICAL HERNIA REPAIR N/A 01/02/2013   Procedure: LAPAROSCOPIC UMBILICAL HERNIA;  Surgeon: Lodema PilotBrian Layton, DO;  Location: [facility];  Service:  General;  Laterality: N/A;    OB History    No data available       Home Medications    Prior to Admission medications   Medication Sig Start Date End Date Taking? Authorizing Provider  cephALEXin (KEFLEX) 500 MG capsule Take 1 capsule (500 mg total) by mouth 4 (four) times daily. Patient not taking: Reported on 11/30/2015 10/04/15   Burgess AmorJulie Idol, PA-C  doxycycline (VIBRAMYCIN) 100 MG capsule Take 1 capsule (100 mg total) by mouth 2 (two) times daily. Patient not taking: Reported on 11/30/2015 10/04/15   Burgess AmorJulie Idol, PA-C  fluconazole (DIFLUCAN) 200 MG tablet Take 1 tablet (200 mg total) by mouth daily. 03/27/16 04/03/16  Devoria AlbeIva Selah Klang, MD  HYDROcodone-ibuprofen (VICOPROFEN) 7.5-200 MG tablet Take 1 tablet by mouth every 6 (six) hours as needed for severe pain. Patient not taking: Reported on 10/04/2015 09/11/15   Gerhard Munchobert Lockwood, MD  ibuprofen (ADVIL,MOTRIN) 200 MG tablet Take 400 mg by mouth every 6 (six) hours as needed for headache or moderate pain.    Historical Provider, MD  insulin aspart (NOVOLOG) 100 UNIT/ML injection SLIDING SCALE 3 TIMES DAILY WITH MEALS: For blood sugar 70-120-no insulin. Blood sugar 121-150 take 5 units. Blood sugar 151-208 take 8 units. Blood sugar 201-250 take 12 units. Blood sugar 251-300 take 17 units. Blood sugar 301-350 take 20 units. Blood sugar 351-400 take 25 units. Blood  sugar greater than 400 take 28 units and call your doctor. 03/27/16   Devoria Albe, MD  Insulin Glargine (LANTUS SOLOSTAR) 100 UNIT/ML Solostar Pen Inject 50 Units into the skin at bedtime. 03/27/16   Devoria Albe, MD  oxyCODONE-acetaminophen (PERCOCET/ROXICET) 5-325 MG tablet Take 1 tablet by mouth every 4 (four) hours as needed. Patient not taking: Reported on 11/30/2015 10/04/15   Burgess Amor, PA-C    Family History Family History  Problem Relation Age of Onset  . Cancer Mother     leukemia  . Diabetes Mother   . Hypertension Mother   . Hyperlipidemia Mother     Social History Social History    Substance Use Topics  . Smoking status: Never Smoker  . Smokeless tobacco: Never Used  . Alcohol use No  On disability   Allergies   Latex and Sulfa antibiotics   Review of Systems Review of Systems  All other systems reviewed and are negative.    Physical Exam Updated Vital Signs BP 134/84 (BP Location: Left Arm)   Pulse 84   Temp 98.2 F (36.8 C) (Oral)   Resp 18   Ht 5\' 11"  (1.803 m)   Wt (!) 530 lb (240.4 kg)   SpO2 98%   BMI 73.92 kg/m   Vital signs normal    Physical Exam  Constitutional: She is oriented to person, place, and time. She appears well-developed and well-nourished.  Non-toxic appearance. She does not appear ill. No distress.  Morbidly obese  HENT:  Head: Normocephalic and atraumatic.  Right Ear: External ear normal.  Left Ear: External ear normal.  Nose: Nose normal. No mucosal edema or rhinorrhea.  Mouth/Throat: Oropharynx is clear and moist and mucous membranes are normal. No dental abscesses or uvula swelling.  Eyes: Conjunctivae and EOM are normal. Pupils are equal, round, and reactive to light.  Neck: Normal range of motion and full passive range of motion without pain. Neck supple.  Cardiovascular: Normal rate, regular rhythm and normal heart sounds.  Exam reveals no gallop and no friction rub.   No murmur heard. Pulmonary/Chest: Effort normal and breath sounds normal. No respiratory distress. She has no wheezes. She has no rhonchi. She has no rales. She exhibits no tenderness and no crepitus.  Abdominal: Soft. Normal appearance and bowel sounds are normal. She exhibits no distension. There is no tenderness. There is no rebound and no guarding.  Musculoskeletal: Normal range of motion. She exhibits no edema or tenderness.  Moves all extremities well.   Neurological: She is alert and oriented to person, place, and time. She has normal strength. No cranial nerve deficit.  Skin: Skin is warm, dry and intact. No rash noted. No erythema. No  pallor.  Psychiatric: She has a normal mood and affect. Her speech is normal and behavior is normal. Her mood appears not anxious.  Nursing note and vitals reviewed.    ED Treatments / Results  Labs (all labs ordered are listed, but only abnormal results are displayed) Results for orders placed or performed during the hospital encounter of 03/27/16  Basic metabolic panel  Result Value Ref Range   Sodium 136 135 - 145 mmol/L   Potassium 3.7 3.5 - 5.1 mmol/L   Chloride 102 101 - 111 mmol/L   CO2 24 22 - 32 mmol/L   Glucose, Bld 316 (H) 65 - 99 mg/dL   BUN 8 6 - 20 mg/dL   Creatinine, Ser 4.09 0.44 - 1.00 mg/dL   Calcium 8.8 (L) 8.9 -  10.3 mg/dL   GFR calc non Af Amer >60 >60 mL/min   GFR calc Af Amer >60 >60 mL/min   Anion gap 10 5 - 15  CBC with Differential  Result Value Ref Range   WBC 5.4 4.0 - 10.5 K/uL   RBC 4.62 3.87 - 5.11 MIL/uL   Hemoglobin 12.9 12.0 - 15.0 g/dL   HCT 16.1 09.6 - 04.5 %   MCV 88.5 78.0 - 100.0 fL   MCH 27.9 26.0 - 34.0 pg   MCHC 31.5 30.0 - 36.0 g/dL   RDW 40.9 81.1 - 91.4 %   Platelets 149 (L) 150 - 400 K/uL   Neutrophils Relative % 56 %   Neutro Abs 3.0 1.7 - 7.7 K/uL   Lymphocytes Relative 38 %   Lymphs Abs 2.1 0.7 - 4.0 K/uL   Monocytes Relative 4 %   Monocytes Absolute 0.2 0.1 - 1.0 K/uL   Eosinophils Relative 2 %   Eosinophils Absolute 0.1 0.0 - 0.7 K/uL   Basophils Relative 0 %   Basophils Absolute 0.0 0.0 - 0.1 K/uL  CBG monitoring, ED  Result Value Ref Range   Glucose-Capillary 308 (H) 65 - 99 mg/dL  POC CBG, ED  Result Value Ref Range   Glucose-Capillary 288 (H) 65 - 99 mg/dL   Laboratory interpretation all normal except Hyperglycemia without metabolic acidosis       Radiology No results found.  Procedures Procedures (including critical care time)  Medications Ordered in ED Medications  sodium chloride 0.9 % bolus 1,000 mL (0 mLs Intravenous Stopped 03/27/16 0416)     Initial Impression / Assessment and Plan / ED  Course  I have reviewed the triage vital signs and the nursing notes.  Pertinent labs & imaging results that were available during my care of the patient were reviewed by me and considered in my medical decision making (see chart for details).  Clinical Course   Patient was given IV fluids, her CBG improved to 88 with just IV fluids. She states her CBG is normally around 250. At this point she feels ready to be discharged home. I wrote for her insulin which she states she can get filled later today at [facility]. She is encouraged to follow-up with her family doctor to get her tocometer again and to continue her insulin. She was started on Diflucan for her vaginitis from her hyperglycemia.  Final Clinical Impressions(s) / ED Diagnoses   Final diagnoses:  Hyperglycemia  Vaginitis    New Prescriptions New Prescriptions   FLUCONAZOLE (DIFLUCAN) 200 MG TABLET    Take 1 tablet (200 mg total) by mouth daily.   Modified Medications   Modified Medication Previous Medication   INSULIN ASPART (NOVOLOG) 100 UNIT/ML INJECTION insulin aspart (NOVOLOG) 100 UNIT/ML injection      SLIDING SCALE 3 TIMES DAILY WITH MEALS: For blood sugar 70-120-no insulin. Blood sugar 121-150 take 5 units. Blood sugar 151-208 take 8 units. Blood sugar 201-250 take 12 units. Blood sugar 251-300 take 17 units. Blood sugar 301-350 take 20 units. Blood sugar 351-400 take 25 units. Blood sugar greater than 400 take 28 units and call your doctor.    SLIDING SCALE 3 TIMES DAILY WITH MEALS: For blood sugar 70-120-no insulin. Blood sugar 121-150 take 5 units. Blood sugar 151-208 take 8 units. Blood sugar 201-250 take 12 units. Blood sugar 251-300 take 17 units. Blood sugar 301-350 take 20 units. Blood sugar 351-400 take 25 units. Blood sugar greater than 400 take 28 units and  call your doctor.   INSULIN GLARGINE (LANTUS SOLOSTAR) 100 UNIT/ML SOLOSTAR PEN Insulin Glargine (LANTUS SOLOSTAR) 100 UNIT/ML Solostar Pen      Inject 50 Units  into the skin at bedtime.    Inject 50 Units into the skin at bedtime.    Plan discharge  Devoria Albe, MD, Concha Pyo, MD 03/27/16 [phone]

## 2016-03-27 NOTE — Discharge Instructions (Signed)
You need to get another glucometer so you can monitor your blood sugar and no what your sliding scale should be. Take the Diflucan once a day until gone. Recheck if you feel worse.

## 2016-09-01 ENCOUNTER — Encounter (HOSPITAL_COMMUNITY): Payer: Self-pay | Admitting: *Deleted

## 2016-09-01 ENCOUNTER — Emergency Department (HOSPITAL_COMMUNITY)
Admission: EM | Admit: 2016-09-01 | Discharge: 2016-09-02 | Disposition: A | Discharge status: Home / Self Care | Payer: Medicaid Other | Attending: Emergency Medicine | Admitting: Emergency Medicine

## 2016-09-01 DIAGNOSIS — L03116 Cellulitis of left lower limb: Secondary | ICD-10-CM | POA: Diagnosis not present

## 2016-09-01 DIAGNOSIS — Z79899 Other long term (current) drug therapy: Secondary | ICD-10-CM | POA: Diagnosis not present

## 2016-09-01 DIAGNOSIS — E876 Hypokalemia: Secondary | ICD-10-CM | POA: Diagnosis not present

## 2016-09-01 DIAGNOSIS — Z794 Long term (current) use of insulin: Secondary | ICD-10-CM | POA: Insufficient documentation

## 2016-09-01 DIAGNOSIS — B372 Candidiasis of skin and nail: Secondary | ICD-10-CM

## 2016-09-01 DIAGNOSIS — E119 Type 2 diabetes mellitus without complications: Secondary | ICD-10-CM | POA: Diagnosis not present

## 2016-09-01 DIAGNOSIS — Z791 Long term (current) use of non-steroidal anti-inflammatories (NSAID): Secondary | ICD-10-CM | POA: Diagnosis not present

## 2016-09-01 HISTORY — DX: Cellulitis of unspecified part of limb: L03.119

## 2016-09-01 HISTORY — DX: Cutaneous abscess of limb, unspecified: L02.419

## 2016-09-01 LAB — CBC WITH DIFFERENTIAL/PLATELET
Basophils Absolute: 0 10*3/uL (ref 0.0–0.1)
Basophils Relative: 0 %
EOS ABS: 0.1 10*3/uL (ref 0.0–0.7)
Eosinophils Relative: 2 %
HCT: 34.5 % — ABNORMAL LOW (ref 36.0–46.0)
Hemoglobin: 11.2 g/dL — ABNORMAL LOW (ref 12.0–15.0)
LYMPHS ABS: 1.9 10*3/uL (ref 0.7–4.0)
Lymphocytes Relative: 26 %
MCH: 28.6 pg (ref 26.0–34.0)
MCHC: 32.5 g/dL (ref 30.0–36.0)
MCV: 88 fL (ref 78.0–100.0)
MONOS PCT: 5 %
Monocytes Absolute: 0.4 10*3/uL (ref 0.1–1.0)
Neutro Abs: 4.8 10*3/uL (ref 1.7–7.7)
Neutrophils Relative %: 67 %
Platelets: 226 10*3/uL (ref 150–400)
RBC: 3.92 MIL/uL (ref 3.87–5.11)
RDW: 13.4 % (ref 11.5–15.5)
WBC: 7.2 10*3/uL (ref 4.0–10.5)

## 2016-09-01 LAB — BASIC METABOLIC PANEL
Anion gap: 7 (ref 5–15)
BUN: 7 mg/dL (ref 6–20)
CO2: 30 mmol/L (ref 22–32)
CREATININE: 0.93 mg/dL (ref 0.44–1.00)
Calcium: 8.3 mg/dL — ABNORMAL LOW (ref 8.9–10.3)
Chloride: 101 mmol/L (ref 101–111)
GFR calc Af Amer: >60 mL/min (ref >60)
Glucose, Bld: 243 mg/dL — ABNORMAL HIGH (ref 65–99)
Potassium: 3 mmol/L — ABNORMAL LOW (ref 3.5–5.1)
SODIUM: 138 mmol/L (ref 135–145)

## 2016-09-01 MED ORDER — SODIUM CHLORIDE 0.9 % IV BOLUS (SEPSIS)
1000.0000 mL | Freq: Once | INTRAVENOUS | Status: AC
  Administered 2016-09-01: 1000 mL via INTRAVENOUS

## 2016-09-01 MED ORDER — ONDANSETRON HCL 4 MG/2ML IJ SOLN
4.0000 mg | Freq: Once | INTRAMUSCULAR | Status: AC
  Administered 2016-09-01: 4 mg via INTRAVENOUS
  Filled 2016-09-01: qty 2

## 2016-09-01 MED ORDER — VANCOMYCIN HCL IN DEXTROSE 1-5 GM/200ML-% IV SOLN
1000.0000 mg | Freq: Once | INTRAVENOUS | Status: AC
  Administered 2016-09-01: 1000 mg via INTRAVENOUS
  Filled 2016-09-01: qty 200

## 2016-09-01 NOTE — ED Triage Notes (Signed)
Pt states she was admitted to [name] 1 week ago for cellulitis of right leg, but pt states she is still having pain and redness; pt states the meds she was prescribed are making her sick to her stomach; pt is also c/o pain all over body

## 2016-09-02 MED ORDER — OXYCODONE-ACETAMINOPHEN 5-325 MG PO TABS
1.0000 | ORAL_TABLET | Freq: Once | ORAL | Status: AC
  Administered 2016-09-02: 1 via ORAL
  Filled 2016-09-02: qty 1

## 2016-09-02 MED ORDER — POTASSIUM CHLORIDE CRYS ER 20 MEQ PO TBCR: 20.0000 meq | EXTENDED_RELEASE_TABLET | Freq: Two times a day (BID) | ORAL | 0 refills | Status: DC

## 2016-09-02 MED ORDER — PENICILLIN V POTASSIUM 500 MG PO TABS: 500.0000 mg | ORAL_TABLET | Freq: Three times a day (TID) | ORAL | 0 refills | Status: DC

## 2016-09-02 MED ORDER — CLOTRIMAZOLE-BETAMETHASONE 1-0.05 % EX CREA: 1.0000 "application " | TOPICAL_CREAM | Freq: Two times a day (BID) | CUTANEOUS | 1 refills | Status: DC

## 2016-09-02 NOTE — ED Notes (Addendum)
Pt ambulated to the bathroom and back to room

## 2016-09-02 NOTE — ED Provider Notes (Signed)
[facility] Provider Note   CSN: [id number] Arrival date & time: 09/01/16  1900     History   Chief Complaint Chief Complaint  Patient presents with  . Muscle Pain    HPI Brittany [name] is a 36 y.o. female.  HPI  Brittany [name] is a morbidly obese 36 y.o. female with hx of lymphedema, recurrent cellulitis, DM, who presents to the Emergency Department complaining of persistent pain and redness of the left leg.  She states that she was recently discharged from another hospital one week ago and treated for cellulitis.  She was discharged home with po clindamycin and rifampin.  She reports having persistent nausea and vomiting from taking theses medications despite taking zofran as well.  She states the symptoms of her leg seem to be improving, but she stopped the antibiotics two days prior.  She also complains of irritation, drainage and redness along the skin folds of her vagina, legs and abdomen.  She denies fever, diarrhea, abdominal pain, and increasing pain or redness of her leg. She has a f/u appt with her PMD next week.  Past Medical History:  Diagnosis Date  . Cellulitis and abscess of leg   . Depression   . Diabetes mellitus without complication (HCC)   . Lymphedema   . Sleep apnea    DX with sleep apnea - unable to use c-pap  . Umbilical hernia     Patient Active Problem List   Diagnosis Date Noted  . Candidal intertrigo 12/31/2014  . Morbid obesity (HCC) 12/29/2014  . Hyponatremia 12/29/2014  . Cellulitis of left lower extremity 12/29/2014  . DM (diabetes mellitus), type 2, uncontrolled (HCC) 01/04/2013    Past Surgical History:  Procedure Laterality Date  . CESAREAN SECTION  11/09/01  . INSERTION OF MESH N/A 01/02/2013   Procedure: INSERTION OF MESH;  Surgeon: Lodema Pilot, DO;  Location: [facility];  Service: General;  Laterality: N/A;  . UMBILICAL HERNIA REPAIR N/A 01/02/2013   Procedure: LAPAROSCOPIC UMBILICAL HERNIA;  Surgeon: Lodema Pilot, DO;   Location: [facility];  Service: General;  Laterality: N/A;    OB History    No data available       Home Medications    Prior to Admission medications   Medication Sig Start Date End Date Taking? Authorizing Provider  clindamycin (CLEOCIN) 300 MG capsule Take 450 mg by mouth 4 (four) times daily. 150mg  capsules and 300mg  capsule to equal 450mg  four times daily. 15 day course starting on 08/25/2016   Yes Historical Provider, MD  ibuprofen (ADVIL,MOTRIN) 200 MG tablet Take 800 mg by mouth every 6 (six) hours as needed for headache or moderate pain.    Yes Historical Provider, MD  insulin aspart (NOVOLOG) 100 UNIT/ML injection SLIDING SCALE 3 TIMES DAILY WITH MEALS: For blood sugar 70-120-no insulin. Blood sugar 121-150 take 5 units. Blood sugar 151-208 take 8 units. Blood sugar 201-250 take 12 units. Blood sugar 251-300 take 17 units. Blood sugar 301-350 take 20 units. Blood sugar 351-400 take 25 units. Blood sugar greater than 400 take 28 units and call your doctor. 03/27/16  Yes Devoria Albe, MD  Insulin Glargine (LANTUS SOLOSTAR) 100 UNIT/ML Solostar Pen Inject 50 Units into the skin at bedtime. 03/27/16  Yes Devoria Albe, MD  ondansetron (ZOFRAN) 4 MG tablet Take 4 mg by mouth every 8 (eight) hours as needed for nausea or vomiting.   Yes Historical Provider, MD  rifampin (RIFADIN) 300 MG capsule Take 300 mg by mouth every  12 (twelve) hours.   Yes Historical Provider, MD    Family History Family History  Problem Relation Age of Onset  . Cancer Mother     leukemia  . Diabetes Mother   . Hypertension Mother   . Hyperlipidemia Mother     Social History Social History  Substance Use Topics  . Smoking status: Never Smoker  . Smokeless tobacco: Never Used  . Alcohol use No     Allergies   Latex and Sulfa antibiotics   Review of Systems Review of Systems  Constitutional: Negative for chills and fever.  Respiratory: Negative for chest tightness and shortness of breath.   Cardiovascular:  Negative for chest pain.  Gastrointestinal: Positive for nausea and vomiting. Negative for abdominal pain and diarrhea.  Genitourinary: Negative for difficulty urinating and dysuria.  Musculoskeletal: Positive for arthralgias (left leg pain and redness) and myalgias. Negative for joint swelling.  Skin: Positive for color change. Negative for wound (pain and redness of the skin folds).  Neurological: Negative for dizziness, weakness and numbness.  All other systems reviewed and are negative.    Physical Exam Updated Vital Signs BP 147/95 (BP Location: Left Arm)   Pulse 88   Temp 98.5 F (36.9 C) (Oral)   Resp 18   Ht 5\' 11"  (1.803 m)   Wt (!) 249.5 kg   SpO2 98%   BMI 76.71 kg/m   Physical Exam  Constitutional: She is oriented to person, place, and time. She appears well-developed and well-nourished. No distress.  Pt morbidly obese  HENT:  Head: Atraumatic.  Mouth/Throat: Oropharynx is clear and moist.  Cardiovascular: Normal rate, regular rhythm and intact distal pulses.   Pulmonary/Chest: Effort normal and breath sounds normal. No respiratory distress. She exhibits no tenderness.  Abdominal: Soft. There is no tenderness.  Musculoskeletal: She exhibits tenderness.  Confluent erythema of the left lower leg.  No excessive warmth or open wounds.  DP pulse brisk, distal sensation intact  Neurological: She is alert and oriented to person, place, and time.  Skin: Skin is warm.  Erythema, smegma within the skin folds along the LE's, external vaginal area, and abdominal pannus.    Nursing note and vitals reviewed.    ED Treatments / Results  Labs (all labs ordered are listed, but only abnormal results are displayed) Labs Reviewed  CBC WITH DIFFERENTIAL/PLATELET - Abnormal; Notable for the following:       Result Value   Hemoglobin 11.2 (*)    HCT 34.5 (*)    All other components within normal limits  BASIC METABOLIC PANEL - Abnormal; Notable for the following:    Potassium  3.0 (*)    Glucose, Bld 243 (*)    Calcium 8.3 (*)    All other components within normal limits    EKG  EKG Interpretation None       Radiology No results found.  Procedures Procedures (including critical care time)  Medications Ordered in ED Medications  ondansetron (ZOFRAN) injection 4 mg (4 mg Intravenous Given 09/01/16 2311)  sodium chloride 0.9 % bolus 1,000 mL (0 mLs Intravenous Stopped 09/02/16 0025)  vancomycin (VANCOCIN) IVPB 1000 mg/200 mL premix (0 mg Intravenous Stopped 09/02/16 0025)  oxyCODONE-acetaminophen (PERCOCET/ROXICET) 5-325 MG per tablet 1 tablet (1 tablet Oral Given 09/02/16 0028)     Initial Impression / Assessment and Plan / ED Course  I have reviewed the triage vital signs and the nursing notes.  Pertinent labs & imaging results that were available during my care of  the patient were reviewed by me and considered in my medical decision making (see chart for details).     [facility]work from [facility] including pt's labs.  blood cultures grew strep dysglactiae/equisimilis  Pt has appt with PCP on Monday.  She was also seen by Dr. Estell HarpinZammit and care plan discussed.  Pt prefers discharge and appears stable.  No concerning sx's for sepsis.  NV intact.  Based on blood culture reports from recent hospitalization, I will switch pt to PCN and lotrisone cream for candida intertrigo. Hypokalemia addressed with short course of potassium, no DKA. Return precautions discussed and pt agrees to plan  Final Clinical Impressions(s) / ED Diagnoses   Final diagnoses:  Cellulitis of left lower leg  Hypokalemia  Candidal intertrigo    New Prescriptions New Prescriptions   No medications on file     Rosey Bathammy Janmichael Giraud, PA-C 09/03/16 1337    Bethann BerkshireJoseph Zammit, MD 09/03/16 [phone]

## 2016-09-02 NOTE — Discharge Instructions (Signed)
Stop the clindamycin and rifampin.  Start the penicillin as directed.  Clean the affected folds of the skin with mild soap and water and dry thoroughly before applying the cream.  Return to ER for any worsening symptoms such as increasing redness, pain or swelling of the leg, otherwise follow-up with your doctor on Monday

## 2016-09-18 ENCOUNTER — Encounter (HOSPITAL_COMMUNITY): Payer: Self-pay | Admitting: Emergency Medicine

## 2016-09-18 ENCOUNTER — Emergency Department (HOSPITAL_COMMUNITY)
Admission: EM | Admit: 2016-09-18 | Discharge: 2016-09-18 | Disposition: A | Discharge status: Home / Self Care | Payer: Medicaid Other | Attending: Emergency Medicine | Admitting: Emergency Medicine

## 2016-09-18 DIAGNOSIS — M79622 Pain in left upper arm: Secondary | ICD-10-CM | POA: Diagnosis present

## 2016-09-18 DIAGNOSIS — G5602 Carpal tunnel syndrome, left upper limb: Secondary | ICD-10-CM | POA: Diagnosis not present

## 2016-09-18 DIAGNOSIS — E119 Type 2 diabetes mellitus without complications: Secondary | ICD-10-CM | POA: Insufficient documentation

## 2016-09-18 MED ORDER — OXYCODONE-ACETAMINOPHEN 5-325 MG PO TABS
2.0000 | ORAL_TABLET | Freq: Once | ORAL | Status: AC
  Administered 2016-09-18: 2 via ORAL
  Filled 2016-09-18: qty 2

## 2016-09-18 MED ORDER — OXYCODONE-ACETAMINOPHEN 5-325 MG PO TABS: 1.0000 | ORAL_TABLET | ORAL | 0 refills | Status: DC | PRN

## 2016-09-18 NOTE — ED Provider Notes (Signed)
[facility] Provider Note   CSN: [id number] Arrival date & time: 09/18/16  1953 By signing my name below, I, Levon HedgerElizabeth Hall, attest that this documentation has been prepared under the direction and in the presence of non-physician practitioner, Pauline Ausammy Emonnie Cannady, PA-C. Electronically Signed: Levon HedgerElizabeth Hall, Scribe. 09/18/2016. 8:30 PM.   History   Chief Complaint Chief Complaint  Patient presents with  . Arm Pain   HPI Brittany [name] is a morbidly obese 36 y.o. female with a history of DM, cellulitis, and lymphedema who presents to the Emergency Department complaining of gradually worsening, waxing and waning left upper arm pain that radiates to her left hand onset three weeks ago. She describes this as 8/10 shooting, burning pain that is most severe in her thumb and index finger. Pt states pain is exacerbated by movement, griping objects with her left hand, and with direct palpation.  Pt has taken Ibuprofen with no relief. No recent injury or trauma. She has seen her PCP for this who has not prescribed any medication for her pain, but plans to perform a nerve conduction study per patient. She denies any neck pain, headaches, visual changes, chest pain, extremity weakness, rash or swelling.   The history is provided by the patient. No language interpreter was used.   Past Medical History:  Diagnosis Date  . Cellulitis and abscess of leg   . Depression   . Diabetes mellitus without complication (HCC)   . Lymphedema   . Sleep apnea    DX with sleep apnea - unable to use c-pap  . Umbilical hernia    Patient Active Problem List   Diagnosis Date Noted  . Candidal intertrigo 12/31/2014  . Morbid obesity (HCC) 12/29/2014  . Hyponatremia 12/29/2014  . Cellulitis of left lower extremity 12/29/2014  . DM (diabetes mellitus), type 2, uncontrolled (HCC) 01/04/2013   Past Surgical History:  Procedure Laterality Date  . CESAREAN SECTION  11/09/01  . INSERTION OF MESH N/A 01/02/2013   Procedure: INSERTION OF MESH;  Surgeon: Lodema PilotBrian Layton, DO;  Location: [facility];  Service: General;  Laterality: N/A;  . UMBILICAL HERNIA REPAIR N/A 01/02/2013   Procedure: LAPAROSCOPIC UMBILICAL HERNIA;  Surgeon: Lodema PilotBrian Layton, DO;  Location: [facility];  Service: General;  Laterality: N/A;    OB History    No data available     Home Medications    Prior to Admission medications   Medication Sig Start Date End Date Taking? Authorizing Provider  clindamycin (CLEOCIN) 300 MG capsule Take 450 mg by mouth 4 (four) times daily. 150mg  capsules and 300mg  capsule to equal 450mg  four times daily. 15 day course starting on 08/25/2016    Historical Provider, MD  clotrimazole-betamethasone (LOTRISONE) cream Apply 1 application topically 2 (two) times daily. 09/02/16   Jakerra Floyd, PA-C  ibuprofen (ADVIL,MOTRIN) 200 MG tablet Take 800 mg by mouth every 6 (six) hours as needed for headache or moderate pain.     Historical Provider, MD  insulin aspart (NOVOLOG) 100 UNIT/ML injection SLIDING SCALE 3 TIMES DAILY WITH MEALS: For blood sugar 70-120-no insulin. Blood sugar 121-150 take 5 units. Blood sugar 151-208 take 8 units. Blood sugar 201-250 take 12 units. Blood sugar 251-300 take 17 units. Blood sugar 301-350 take 20 units. Blood sugar 351-400 take 25 units. Blood sugar greater than 400 take 28 units and call your doctor. 03/27/16   Devoria AlbeIva Knapp, MD  Insulin Glargine (LANTUS SOLOSTAR) 100 UNIT/ML Solostar Pen Inject 50 Units into the skin at bedtime. 03/27/16  Devoria Albe, MD  ondansetron (ZOFRAN) 4 MG tablet Take 4 mg by mouth every 8 (eight) hours as needed for nausea or vomiting.    Historical Provider, MD  penicillin v potassium (VEETID) 500 MG tablet Take 1 tablet (500 mg total) by mouth 3 (three) times daily. 09/02/16   Aubriana Ravelo, PA-C  potassium chloride SA (K-DUR,KLOR-CON) 20 MEQ tablet Take 1 tablet (20 mEq total) by mouth 2 (two) times daily. 09/02/16   Arhaan Chesnut, PA-C  rifampin (RIFADIN) 300 MG capsule  Take 300 mg by mouth every 12 (twelve) hours.    Historical Provider, MD    Family History Family History  Problem Relation Age of Onset  . Cancer Mother     leukemia  . Diabetes Mother   . Hypertension Mother   . Hyperlipidemia Mother     Social History Social History  Substance Use Topics  . Smoking status: Never Smoker  . Smokeless tobacco: Never Used  . Alcohol use No     Allergies   Latex and Sulfa antibiotics   Review of Systems Review of Systems  Constitutional: Negative for appetite change and fever.  Eyes: Negative for visual disturbance.  Respiratory: Negative for chest tightness and shortness of breath.   Cardiovascular: Negative for chest pain.  Gastrointestinal: Negative for abdominal pain, nausea and vomiting.  Musculoskeletal: Positive for myalgias (left arm and hand pain). Negative for neck pain and neck stiffness.  Skin: Negative for rash.  Neurological: Negative for dizziness, syncope, speech difficulty, weakness, numbness and headaches.   Physical Exam Updated Vital Signs BP (!) 145/102 (BP Location: Right Wrist)   Pulse 106   Temp 98.2 F (36.8 C) (Oral)   Resp 24   Ht 5\' 11"  (1.803 m)   Wt (!) 550 lb (249.5 kg)   SpO2 97%   BMI 76.71 kg/m   Physical Exam  Constitutional: She is oriented to person, place, and time. She appears well-developed and well-nourished. No distress.  Pt morbidly obese  HENT:  Head: Normocephalic and atraumatic.  Mouth/Throat: Oropharynx is clear and moist.  Eyes: Conjunctivae are normal. Pupils are equal, round, and reactive to light.  Neck: Normal range of motion, full passive range of motion without pain and phonation normal. No spinous process tenderness and no muscular tenderness present. Normal range of motion present.  Cardiovascular: Normal rate, regular rhythm and intact distal pulses.   Bilateral radial pulses brisk and symmetrical  Pulmonary/Chest: Effort normal and breath sounds normal.  Abdominal:  She exhibits no distension.  Musculoskeletal: She exhibits tenderness. She exhibits no edema or deformity.  Tenderness over left deltoid muscle, left forearm and to palpation of the left thumb, index and middle fingers. Grip strength symmetrical.  Positive Tinel's sign.  No skin changes, no edema of the extremity  Neurological: She is alert and oriented to person, place, and time. She has normal strength. No sensory deficit.  CN II-XII grossly intact.  Sensation intact of BUE's  Skin: Skin is warm and dry.  Psychiatric: She has a normal mood and affect.  Nursing note and vitals reviewed.  ED Treatments / Results  DIAGNOSTIC STUDIES:  Oxygen Saturation is 97% on RA, normal by my interpretation.    COORDINATION OF CARE:  8:29 PM Discussed treatment plan with pt at bedside and pt agreed to plan.   Labs (all labs ordered are listed, but only abnormal results are displayed) Labs Reviewed - No data to display  EKG  EKG Interpretation None  Radiology No results found.  Procedures Procedures (including critical care time)  Medications Ordered in ED Medications - No data to display   Initial Impression / Assessment and Plan / ED Course  I have reviewed the triage vital signs and the nursing notes.  Pertinent labs & imaging results that were available during my care of the patient were reviewed by me and considered in my medical decision making (see chart for details).     Pt is chronic LUE pain.  NV intact, no motor or sensaory deficits, no cervical tenderness.  Exam not c/w with radicular pain and felt to be related to carpal tunnel.    Cock up splint applied.  Pain improved after pain medication here and application of the splint.  Has follow-up appt with her PCP for possible nerve conduction study.    The patient appears reasonably screened and/or stabilized for discharge and I doubt any other medical condition or other [facility] requiring further screening, evaluation, or  treatment in the ED at this time prior to discharge.   Final Clinical Impressions(s) / ED Diagnoses   Final diagnoses:  Carpal tunnel syndrome of left wrist    New Prescriptions Discharge Medication List as of 09/18/2016  9:30 PM    START taking these medications   Details          I personally performed the services described in this documentation, which was scribed in my presence. The recorded information has been reviewed and is accurate.    Pauline Aus, PA-C 09/21/16 1927    Samuel Jester, DO 09/22/16 [phone]

## 2016-09-18 NOTE — ED Triage Notes (Signed)
Pt states she has a pinched radial nerve and has had pain for 3 weeks.  Tonight pain getting worse.  Having burning, shooting pain in left hand

## 2016-09-18 NOTE — Discharge Instructions (Signed)
Elevate your hand when possible.  Call your primary doctor to arrange follow-up or call the orthopedic doctor listed.

## 2016-09-30 ENCOUNTER — Encounter (HOSPITAL_COMMUNITY): Payer: Self-pay | Admitting: Emergency Medicine

## 2016-09-30 ENCOUNTER — Emergency Department (HOSPITAL_COMMUNITY)
Admission: EM | Admit: 2016-09-30 | Discharge: 2016-10-01 | Disposition: A | Discharge status: Home / Self Care | Payer: Medicaid Other | Attending: Emergency Medicine | Admitting: Emergency Medicine

## 2016-09-30 DIAGNOSIS — Z79899 Other long term (current) drug therapy: Secondary | ICD-10-CM | POA: Insufficient documentation

## 2016-09-30 DIAGNOSIS — Z794 Long term (current) use of insulin: Secondary | ICD-10-CM | POA: Diagnosis not present

## 2016-09-30 DIAGNOSIS — G5602 Carpal tunnel syndrome, left upper limb: Secondary | ICD-10-CM | POA: Diagnosis not present

## 2016-09-30 DIAGNOSIS — R197 Diarrhea, unspecified: Secondary | ICD-10-CM | POA: Diagnosis not present

## 2016-09-30 DIAGNOSIS — M79642 Pain in left hand: Secondary | ICD-10-CM

## 2016-09-30 DIAGNOSIS — R1110 Vomiting, unspecified: Secondary | ICD-10-CM | POA: Diagnosis not present

## 2016-09-30 DIAGNOSIS — E119 Type 2 diabetes mellitus without complications: Secondary | ICD-10-CM | POA: Insufficient documentation

## 2016-09-30 MED ORDER — OXYCODONE-ACETAMINOPHEN 5-325 MG PO TABS
2.0000 | ORAL_TABLET | Freq: Once | ORAL | Status: AC
  Administered 2016-09-30: 2 via ORAL
  Filled 2016-09-30: qty 2

## 2016-09-30 NOTE — ED Provider Notes (Signed)
[facility] Provider Note   CSN: [id number] Arrival date & time: 09/30/16  2203   By signing my name below, I, Clovis Pu, attest that this documentation has been prepared under the direction and in the presence of Pricilla Loveless, MD  Electronically Signed: Clovis Pu, ED Scribe. 09/30/16. 12:00 AM.   History   Chief Complaint Chief Complaint  Patient presents with  . Hand Pain   The history is provided by the patient. No language interpreter was used.   HPI Comments:  Brittany [name] is a 36 y.o. female who presents to the Emergency Department complaining of persistent left hand pain x 1.5 months. She also reports intermittent burning pain to her left hand, numbness to the index and middle fingers of her left hand, diarrhea due to excessive ibuprofen use and vomiting secondary to pain. She notes her pain radiates up her left upper extremity with movement of her hand. Pt states she was diagnosed with carpal tunnel about 2 weeks ago. She has used multiple hand braces with no relief. She has used 800 mg ibuprofen with no relief. Uses about every 4 hours, is concerned she is hurting her body by taking this much. She has also used Salonpas ointment with relief but notes this is not working anymore. Pt denies dysuria or any other associated symptoms. Pt also complains of an ongoing yeast issue with she describes as a gradually improving skin irritation. Pt denies vaginal discharge. No other complaints noted.   PCP: Louie Boston, MD  Past Medical History:  Diagnosis Date  . Cellulitis and abscess of leg   . Depression   . Diabetes mellitus without complication (HCC)   . Lymphedema   . Sleep apnea    DX with sleep apnea - unable to use c-pap  . Umbilical hernia     Patient Active Problem List   Diagnosis Date Noted  . Candidal intertrigo 12/31/2014  . Morbid obesity (HCC) 12/29/2014  . Hyponatremia 12/29/2014  . Cellulitis of left lower extremity 12/29/2014  . DM  (diabetes mellitus), type 2, uncontrolled (HCC) 01/04/2013    Past Surgical History:  Procedure Laterality Date  . CESAREAN SECTION  11/09/01  . INSERTION OF MESH N/A 01/02/2013   Procedure: INSERTION OF MESH;  Surgeon: Lodema Pilot, DO;  Location: [facility];  Service: General;  Laterality: N/A;  . UMBILICAL HERNIA REPAIR N/A 01/02/2013   Procedure: LAPAROSCOPIC UMBILICAL HERNIA;  Surgeon: Lodema Pilot, DO;  Location: [facility];  Service: General;  Laterality: N/A;    OB History    No data available       Home Medications    Prior to Admission medications   Medication Sig Start Date End Date Taking? Authorizing Provider  clindamycin (CLEOCIN) 300 MG capsule Take 450 mg by mouth 4 (four) times daily. 150mg  capsules and 300mg  capsule to equal 450mg  four times daily. 15 day course starting on 08/25/2016    Historical Provider, MD  clotrimazole-betamethasone (LOTRISONE) cream Apply 1 application topically 2 (two) times daily. 09/02/16   Tammy Triplett, PA-C  gabapentin (NEURONTIN) 300 MG capsule Take 1 capsule (300 mg total) by mouth 3 (three) times daily. 10/01/16   Pricilla Loveless, MD  insulin aspart (NOVOLOG) 100 UNIT/ML injection SLIDING SCALE 3 TIMES DAILY WITH MEALS: For blood sugar 70-120-no insulin. Blood sugar 121-150 take 5 units. Blood sugar 151-208 take 8 units. Blood sugar 201-250 take 12 units. Blood sugar 251-300 take 17 units. Blood sugar 301-350 take 20 units. Blood sugar 351-400 take 25  units. Blood sugar greater than 400 take 28 units and call your doctor. 03/27/16   Devoria Albe, MD  Insulin Glargine (LANTUS SOLOSTAR) 100 UNIT/ML Solostar Pen Inject 50 Units into the skin at bedtime. 03/27/16   Devoria Albe, MD  naproxen (NAPROSYN) 500 MG tablet Take 1 tablet (500 mg total) by mouth 2 (two) times daily with a meal. 10/01/16   Pricilla Loveless, MD  ondansetron (ZOFRAN) 4 MG tablet Take 4 mg by mouth every 8 (eight) hours as needed for nausea or vomiting.    Historical Provider, MD    oxyCODONE-acetaminophen (PERCOCET/ROXICET) 5-325 MG tablet Take 1 tablet by mouth every 4 (four) hours as needed. 09/18/16   Tammy Triplett, PA-C  penicillin v potassium (VEETID) 500 MG tablet Take 1 tablet (500 mg total) by mouth 3 (three) times daily. 09/02/16   Tammy Triplett, PA-C  potassium chloride SA (K-DUR,KLOR-CON) 20 MEQ tablet Take 1 tablet (20 mEq total) by mouth 2 (two) times daily. 09/02/16   Tammy Triplett, PA-C  rifampin (RIFADIN) 300 MG capsule Take 300 mg by mouth every 12 (twelve) hours.    Historical Provider, MD    Family History Family History  Problem Relation Age of Onset  . Cancer Mother     leukemia  . Diabetes Mother   . Hypertension Mother   . Hyperlipidemia Mother     Social History Social History  Substance Use Topics  . Smoking status: Never Smoker  . Smokeless tobacco: Never Used  . Alcohol use No     Allergies   Latex and Sulfa antibiotics   Review of Systems Review of Systems  Gastrointestinal: Positive for diarrhea and vomiting.  Genitourinary: Negative for dysuria and vaginal discharge.  Musculoskeletal: Positive for myalgias.  Neurological: Positive for numbness.  All other systems reviewed and are negative.  Physical Exam Updated Vital Signs BP (!) 139/97 (BP Location: Right Wrist)   Pulse 93   Temp 98.6 F (37 C) (Oral)   Resp (!) 23   Wt (!) 550 lb (249.5 kg)   SpO2 100%   BMI 76.71 kg/m   Physical Exam  Constitutional: She is oriented to person, place, and time. She appears well-developed and well-nourished.  Morbidly obese  HENT:  Head: Normocephalic and atraumatic.  Right Ear: External ear normal.  Left Ear: External ear normal.  Nose: Nose normal.  Eyes: Right eye exhibits no discharge. Left eye exhibits no discharge.  Cardiovascular: Normal rate and regular rhythm.   Pulses:      Radial pulses are 2+ on the left side.  Pulmonary/Chest: Effort normal.  Musculoskeletal: Normal range of motion. She exhibits  tenderness. She exhibits no edema.  Left hand tenderness without swelling over the left thumb, left index finger and thenar eminence. No decreased sensation. Full ROM but is painful. No wrist swelling or pain.   Neurological: She is alert and oriented to person, place, and time.  Skin: Skin is warm and dry.  Nursing note and vitals reviewed.  ED Treatments / Results  DIAGNOSTIC STUDIES:  Oxygen Saturation is 100% on RA, normal by my interpretation.    COORDINATION OF CARE:  11:52 PM Discussed treatment plan with pt at bedside and pt agreed to plan.  Labs (all labs ordered are listed, but only abnormal results are displayed) Labs Reviewed  BASIC METABOLIC PANEL - Abnormal; Notable for the following:       Result Value   Glucose, Bld 377 (*)    Creatinine, Ser 1.01 (*)  All other components within normal limits  CBC WITH DIFFERENTIAL/PLATELET    EKG  EKG Interpretation None       Radiology Dg Hand Complete Left  Result Date: 10/01/2016 CLINICAL DATA:  36 year old female with pain over the thenar region. EXAM: LEFT HAND - COMPLETE 3+ VIEW COMPARISON:  None. FINDINGS: There is no evidence of fracture or dislocation. There is no evidence of arthropathy or other focal bone abnormality. Soft tissues are unremarkable. IMPRESSION: Negative. Electronically Signed   By: Elgie CollardArash  Radparvar M.D.   On: 10/01/2016 01:05    Procedures Procedures (including critical care time)  Medications Ordered in ED Medications  oxyCODONE-acetaminophen (PERCOCET/ROXICET) 5-325 MG per tablet 2 tablet (2 tablets Oral Given 09/30/16 2358)     Initial Impression / Assessment and Plan / ED Course  I have reviewed the triage vital signs and the nursing notes.  Pertinent labs & imaging results that were available during my care of the patient were reviewed by me and considered in my medical decision making (see chart for details).     Patient declines abdominal/GU exam, but states that her skin  irritation is improving. Her left hand pain is most likely carpal tunnel given that it consist of the thenar hand, as well as the 2nd and 3rd digits. She describes numbness to the radial side of the 4th digits. No muscle wasting. Given a lot of burning and nerve type pain, will start on Neurontin to see if this helps improve her symptoms, especially since she is a diabetic and is at risk for neuropathy. Will change from ibuprofen to naproxen and counseled on staying within prescribed dosages. With her diarrhea, vomiting, and ibuprofen use, labs were checked which are remarkable for hyperglycemia but no renal disease compared to baseline. Discussed importance of following up with orthopedics, who she has not seen yet. Discussed return precautions.  Final Clinical Impressions(s) / ED Diagnoses   Final diagnoses:  Left hand pain  Carpal tunnel syndrome of left wrist    New Prescriptions Discharge Medication List as of 10/01/2016  1:27 AM    START taking these medications   Details  gabapentin (NEURONTIN) 300 MG capsule Take 1 capsule (300 mg total) by mouth 3 (three) times daily., Starting Sat 10/01/2016, Print    naproxen (NAPROSYN) 500 MG tablet Take 1 tablet (500 mg total) by mouth 2 (two) times daily with a meal., Starting Sat 10/01/2016, Print       I personally performed the services described in this documentation, which was scribed in my presence. The recorded information has been reviewed and is accurate.     Pricilla LovelessScott Tiago Humphrey, MD 10/01/16 [phone]

## 2016-09-30 NOTE — ED Triage Notes (Signed)
Lt hand and finger pain that has been going on over a month.  Seen here a week or so ago and given a brace,  Brace is not working.

## 2016-09-30 NOTE — ED Notes (Signed)
Pt SO to desk to request a bag for pt has begun vomiting- When asked, he reports that  Pt is out of her percocet

## 2016-09-30 NOTE — ED Triage Notes (Signed)
Pt with multiple complaints 1. Carpal tunnel pain 2. Burning with urination 3. Diarrhea  Dr Margo Commonapper is her PCP

## 2016-09-30 NOTE — ED Notes (Signed)
Left hand pain for over a month, was seen here 2 weeks ago. Pt states she has purchased 3 additional braces & ibuprofen not working.

## 2016-10-01 ENCOUNTER — Emergency Department (HOSPITAL_COMMUNITY): Payer: Medicaid Other

## 2016-10-01 LAB — BASIC METABOLIC PANEL
Anion gap: 10 (ref 5–15)
BUN: 9 mg/dL (ref 6–20)
CALCIUM: 9.2 mg/dL (ref 8.9–10.3)
CO2: 23 mmol/L (ref 22–32)
CREATININE: 1.01 mg/dL — AB (ref 0.44–1.00)
Chloride: 102 mmol/L (ref 101–111)
GFR calc non Af Amer: >60 mL/min (ref >60)
Glucose, Bld: 377 mg/dL — ABNORMAL HIGH (ref 65–99)
Potassium: 3.8 mmol/L (ref 3.5–5.1)
SODIUM: 135 mmol/L (ref 135–145)

## 2016-10-01 LAB — CBC WITH DIFFERENTIAL/PLATELET
BASOS PCT: 0 %
Basophils Absolute: 0 10*3/uL (ref 0.0–0.1)
EOS ABS: 0.2 10*3/uL (ref 0.0–0.7)
Eosinophils Relative: 3 %
HCT: 42.5 % (ref 36.0–46.0)
HEMOGLOBIN: 14.3 g/dL (ref 12.0–15.0)
Lymphocytes Relative: 35 %
Lymphs Abs: 2.4 10*3/uL (ref 0.7–4.0)
MCH: 28.9 pg (ref 26.0–34.0)
MCHC: 33.6 g/dL (ref 30.0–36.0)
MCV: 86 fL (ref 78.0–100.0)
MONOS PCT: 6 %
Monocytes Absolute: 0.4 10*3/uL (ref 0.1–1.0)
NEUTROS PCT: 56 %
Neutro Abs: 3.9 10*3/uL (ref 1.7–7.7)
Platelets: 204 10*3/uL (ref 150–400)
RBC: 4.94 MIL/uL (ref 3.87–5.11)
RDW: 15 % (ref 11.5–15.5)
WBC: 6.9 10*3/uL (ref 4.0–10.5)

## 2016-10-01 MED ORDER — GABAPENTIN 300 MG PO CAPS: 300.0000 mg | ORAL_CAPSULE | Freq: Three times a day (TID) | ORAL | 0 refills | Status: DC

## 2016-10-01 MED ORDER — NAPROXEN 500 MG PO TABS: 500.0000 mg | ORAL_TABLET | Freq: Two times a day (BID) | ORAL | 0 refills | Status: DC

## 2016-10-01 NOTE — ED Notes (Signed)
Pt alert & oriented x4. Patient given discharge instructions, paperwork & prescription(s). Patient instructed to stop at the registration desk to finish any additional paperwork. Patient verbalized understanding. Pt left department w/ no further questions. 

## 2016-10-06 ENCOUNTER — Encounter: Payer: Self-pay | Admitting: Neurology

## 2016-10-06 ENCOUNTER — Ambulatory Visit (INDEPENDENT_AMBULATORY_CARE_PROVIDER_SITE_OTHER): Payer: Medicaid Other | Admitting: Neurology

## 2016-10-06 VITALS — BP 155/103 | HR 116 | Ht 71.0 in | Wt >= 6400 oz

## 2016-10-06 DIAGNOSIS — G5602 Carpal tunnel syndrome, left upper limb: Secondary | ICD-10-CM

## 2016-10-06 MED ORDER — GABAPENTIN 300 MG PO CAPS: 600.0000 mg | ORAL_CAPSULE | Freq: Three times a day (TID) | ORAL | 4 refills | Status: DC

## 2016-10-06 NOTE — Patient Instructions (Addendum)
Remember to drink plenty of fluid, eat healthy meals and do not skip any meals. Try to eat protein with a every meal and eat a healthy snack such as fruit or nuts in between meals. Try to keep a regular sleep-wake schedule and try to exercise daily, particularly in the form of walking, 20-30 minutes a day, if you can.   As far as your medications are concerned, I would like to suggest: Increase neurontin  As far as diagnostic testing: emg/ncs  I would like to see you back for emg/ncs, sooner if we need to. Please call us with any interim questions, concerns, problems, updates or refill requests.   Our phone number is [phone]. We also have an after hours call service for urgent matters and there is a physician on-call for urgent questions. For any emergencies you know to call 911 or go to the nearest emergency room   Carpal Tunnel Syndrome Carpal tunnel syndrome is a condition that causes pain in your hand and arm. The carpal tunnel is a narrow area located on the palm side of your wrist. Repeated wrist motion or certain diseases may cause swelling within the tunnel. This swelling pinches the main nerve in the wrist (median nerve). What are the causes? This condition may be caused by:  Repeated wrist motions.  Wrist injuries.  Arthritis.  A cyst or tumor in the carpal tunnel.  Fluid buildup during pregnancy. Sometimes the cause of this condition is not known. What increases the risk? This condition is more likely to develop in:  People who have jobs that cause them to repeatedly move their wrists in the same motion, such as Health visitorbutchers and cashiers.  Women.  People with certain conditions, such as:  Diabetes.  Obesity.  An underactive thyroid (hypothyroidism).  Kidney failure. What are the signs or symptoms? Symptoms of this condition include:  A tingling feeling in your fingers, especially in your thumb, index, and middle fingers.  Tingling or numbness in your  hand.  An aching feeling in your entire arm, especially when your wrist and elbow are bent for long periods of time.  Wrist pain that goes up your arm to your shoulder.  Pain that goes down into your palm or fingers.  A weak feeling in your hands. You may have trouble grabbing and holding items. Your symptoms may feel worse during the night. How is this diagnosed? This condition is diagnosed with a medical history and physical exam. You may also have tests, including:  An electromyogram (EMG). This test measures electrical signals sent by your nerves into the muscles.  X-rays. How is this treated? Treatment for this condition includes:  Lifestyle changes. It is important to stop doing or modify the activity that caused your condition.  Physical or occupational therapy.  Medicines for pain and inflammation. This may include medicine that is injected into your wrist.  A wrist splint.  Surgery. Follow these instructions at home: If you have a splint:   Wear it as told by your health care provider. Remove it only as told by your health care provider.  Loosen the splint if your fingers become numb and tingle, or if they turn cold and blue.  Keep the splint clean and dry. General instructions   Take over-the-counter and prescription medicines only as told by your health care provider.  Rest your wrist from any activity that may be causing your pain. If your condition is work related, talk to your employer about changes that can be  made, such as getting a wrist pad to use while typing.  If directed, apply ice to the painful area:  Put ice in a plastic bag.  Place a towel between your skin and the bag.  Leave the ice on for 20 minutes, 2-3 times per day.  Keep all follow-up visits as told by your health care provider. This is important.  Do any exercises as told by your health care provider, physical therapist, or occupational therapist. Contact a health care provider  if:  You have new symptoms.  Your pain is not controlled with medicines.  Your symptoms get worse. This information is not intended to replace advice given to you by your health care provider. Make sure you discuss any questions you have with your health care provider. Document Released: 06/24/2000 Document Revised: 11/05/2015 Document Reviewed: 11/12/2014 Elsevier Interactive Patient Education  2017 [employer].

## 2016-10-06 NOTE — Progress Notes (Signed)
[facility]    Provider:  Dr Lucia GaskinsAhern Referring Provider: Louie Bostonapper, David B., MD Primary Care Physician:  Louie BostonAPPER,DAVID B, MD  CC:  Pain and numbness in left hand  HPI:  Brittany FreezeJennifer L [name] is a 36 y.o. female here as a referral from Dr. Margo Commonapper for pain and numbness in left hand. Past medical history morbid obesity, sleep apnea, depression, panic disorder with a [name] phobia, impaired ambulation, , diabetes. The symptoms started in February when she was hospitalized for cellulitis of the left leg. She feels it on the back of the hand, thumb, index and long fingers.Started Feb 12th at 5am and the whole left arm was numb and cold. It felt cold to the touch but not discolored. By the time she left it had warmed up and improved. She says she can feel the 4th digit being split. Splint did not feel at all good. Did not help. She wakes up at night with tingling and burning. In digits 1-4 and she has pain in the thenar area of the hand. Running under warm after helps. She denies neck pain and radicular symptoms.  Reviewed notes, labs and imaging from outside physicians, which showed:  Reviewed emergency notes. Patient was seen 09/30/2016 with persistent left hand pain for 1-1/2 months. Also burning pain to her left hand, numbness of the index and middle fingers of her left hand. Pain radiates up her left extremity with movements of her hand. She was diagnosed with carpal tunnel. She has used multiple hemiparesis with no relief. She is use ibuprofen with no relief. Uses about every 4 hours. She was started on Neurontin for the pain. She was discharged home.  CBC earlier this month was normal. BMP showed elevated glucose 377 and slightly elevated creatinine 1.01.  Review of Systems: Patient complains of symptoms per HPI as well as the following symptoms: no CP, no SOB. Pertinent negatives per HPI. All others negative.   Social History   Social History  . Marital status: Married    Spouse  name: N/A  . Number of children: 1  . Years of education: 1811   Occupational History  . N/A    Social History Main Topics  . Smoking status: Never Smoker  . Smokeless tobacco: Never Used  . Alcohol use No  . Drug use: No  . Sexual activity: Yes    Birth control/ protection: IUD     Comment: husband   Other Topics Concern  . Not on file   Social History Narrative   Lives at home w/ her husband and daughter   Right-handed   Caffeine daily    Family History  Problem Relation Age of Onset  . Cancer Mother     leukemia  . Diabetes Mother   . Hypertension Mother   . Hyperlipidemia Mother   . Other Father     Sciatica    Past Medical History:  Diagnosis Date  . Anxiety   . Cellulitis and abscess of leg   . Depression   . Diabetes mellitus without complication (HCC)   . Lymphedema   . Sleep apnea    DX with sleep apnea - unable to use c-pap  . Umbilical hernia     Past Surgical History:  Procedure Laterality Date  . CESAREAN SECTION  11/09/01  . INSERTION OF MESH N/A 01/02/2013   Procedure: INSERTION OF MESH;  Surgeon: Lodema PilotBrian Layton, DO;  Location: [facility];  Service: General;  Laterality: N/A;  . UMBILICAL HERNIA REPAIR N/A  01/02/2013   Procedure: LAPAROSCOPIC UMBILICAL HERNIA;  Surgeon: Lodema Pilot, DO;  Location: [facility];  Service: General;  Laterality: N/A;    Current Outpatient Prescriptions  Medication Sig Dispense Refill  . gabapentin (NEURONTIN) 300 MG capsule Take 2 capsules (600 mg total) by mouth 3 (three) times daily. 180 capsule 4  . insulin aspart (NOVOLOG) 100 UNIT/ML injection SLIDING SCALE 3 TIMES DAILY WITH MEALS: For blood sugar 70-120-no insulin. Blood sugar 121-150 take 5 units. Blood sugar 151-208 take 8 units. Blood sugar 201-250 take 12 units. Blood sugar 251-300 take 17 units. Blood sugar 301-350 take 20 units. Blood sugar 351-400 take 25 units. Blood sugar greater than 400 take 28 units and call your doctor. 10 mL 0  . Insulin Glargine (LANTUS  SOLOSTAR) 100 UNIT/ML Solostar Pen Inject 50 Units into the skin at bedtime. 1 pen 0  . naproxen (NAPROSYN) 500 MG tablet Take 1 tablet (500 mg total) by mouth 2 (two) times daily with a meal. 14 tablet 0  . ondansetron (ZOFRAN) 4 MG tablet Take 4 mg by mouth every 8 (eight) hours as needed for nausea or vomiting.     No current facility-administered medications for this visit.     Allergies as of 10/06/2016 - Review Complete 10/06/2016  Allergen Reaction Noted  . Latex Itching, Swelling, and Rash 11/26/2012  . Sulfa antibiotics Nausea And Vomiting and Rash 11/26/2012    Vitals: BP (!) 155/103   Pulse (!) 116   Ht 5\' 11"  (1.803 m)   Wt (!) 536 lb 3.2 oz (243.2 kg)   BMI 74.78 kg/m  Last Weight:  Wt Readings from Last 1 Encounters:  10/06/16 (!) 536 lb 3.2 oz (243.2 kg)   Last Height:   Ht Readings from Last 1 Encounters:  10/06/16 5\' 11"  (1.803 m)      Physical exam: Exam: Gen: NAD, conversant, poorly groomed, morbidly obese             CV: RRR, no MRG. No Carotid Bruits. Pitting edema, warm, nontender Eyes: Conjunctivae clear without exudates or hemorrhage  Neuro: Detailed Neurologic Exam  Speech:    Speech is normal; fluent and spontaneous with normal comprehension.  Cognition:    The patient is oriented to person, place, and time;     recent and remote memory intact;     language fluent;     normal attention, concentration,     fund of knowledge Cranial Nerves:    The pupils are equal, round, and reactive to light. Attempted fundoscopic exam could not visualize.  Visual fields are full to finger confrontation. Extraocular movements are intact. Trigeminal sensation is intact and the muscles of mastication are normal. The face is symmetric. The palate elevates in the midline. Hearing intact. Voice is normal. Shoulder shrug is normal. The tongue has normal motion without fasciculations.   Coordination:    No dysmetria  Gait:    Wide based due to extremely large  body habitus  Motor Observation:    no involuntary movements noted. Tone:    Normal muscle tone.    Posture:    Posture is normal. normal erect    Strength: weakness of median intrinsic muscles of the left hand. Strength is otherwise V/V in the upper and lower limbs.      Sensation: dysesthesias in the left hand in a median distribution     Reflex Exam:  DTR's:    Difficult due to morbid obesity but deep tendon reflexes in the upper and  lower extremities appear symmetric bilaterally.   Toes:    The toes are equivocal bilaterally.   Clonus:    Clonus is absent.  Assessment/Plan:  This is a morbidly obese diabetic 37 year old patient with left hand pain and weakness likely carpal tunnel syndrome. She denies neck pain and radicular symptoms. We'll order an EMG nerve conduction study.  Naomie Dean, MD  [facility] [address] [place], [place] [address]  Phone [phone] Fax [phone]

## 2016-10-12 ENCOUNTER — Encounter: Payer: Self-pay | Admitting: Neurology

## 2016-10-12 ENCOUNTER — Ambulatory Visit (INDEPENDENT_AMBULATORY_CARE_PROVIDER_SITE_OTHER): Payer: Medicaid Other | Admitting: Neurology

## 2016-10-12 ENCOUNTER — Ambulatory Visit (INDEPENDENT_AMBULATORY_CARE_PROVIDER_SITE_OTHER): Payer: Self-pay | Admitting: Neurology

## 2016-10-12 DIAGNOSIS — G5602 Carpal tunnel syndrome, left upper limb: Secondary | ICD-10-CM

## 2016-10-12 HISTORY — DX: Carpal tunnel syndrome, left upper limb: G56.02

## 2016-10-12 NOTE — Progress Notes (Signed)
Please refer to EMG and nerve conduction study procedure note he

## 2016-10-12 NOTE — Procedures (Signed)
     HISTORY:  Brittany [name] is a 36 year old patient with a history of morbid obesity, diabetes, and a two-month history of left hand numbness and discomfort. The patient denies any neck pain or pain radiating down the left arm. The patient is being evaluated for a possible neuropathy or a cervical radiculopathy.  NERVE CONDUCTION STUDIES:  Nerve conduction studies were performed on both upper extremities. The distal motor latency for the left median nerve was prolonged, normal on the right. The distal motor latencies and motor amplitudes for the ulnar nerves were normal bilaterally. The motor amplitudes for the median nerves were normal bilaterally. The nerve conduction velocities for the median and ulnar nerves were normal bilaterally. The sensory latency for the left median nerve was prolonged, normal on the right and normal for the ulnar nerves bilaterally. The F wave latency for the right median nerve was borderline normal, but was slightly prolonged for the left median nerve and for the ulnar nerves bilaterally.  EMG STUDIES:  EMG study was performed on the left upper extremity:  The first dorsal interosseous muscle reveals 2 to 4 K units with full recruitment. No fibrillations or positive waves were noted. The abductor pollicis brevis muscle reveals 2 to 4 K units with full recruitment. No fibrillations or positive waves were noted. The extensor indicis proprius muscle reveals 1 to 3 K units with full recruitment. No fibrillations or positive waves were noted. The pronator teres muscle reveals 2 to 3 K units with full recruitment. No fibrillations or positive waves were noted. The biceps muscle reveals 1 to 2 K units with full recruitment. No fibrillations or positive waves were noted. The triceps muscle reveals 2 to 4 K units with full recruitment. No fibrillations or positive waves were noted. The anterior deltoid muscle reveals 2 to 3 K units with full recruitment. No fibrillations  or positive waves were noted. The cervical paraspinal muscles were tested at 2 levels. No abnormalities of insertional activity were seen at either level tested. There was good relaxation.   IMPRESSION:  Nerve conduction studies done on both upper extremities shows evidence of a mild left carpal tunnel syndrome. EMG evaluation of the left upper extremity was unremarkable, without evidence of an overlying cervical radiculopathy.  Marlan Palau MD 10/12/2016 9:18 AM  [facility] [address] [place], [place] [address]  Phone [phone] Fax [phone]

## 2016-10-23 ENCOUNTER — Inpatient Hospital Stay (HOSPITAL_COMMUNITY)
Admission: EM | Admit: 2016-10-23 | Discharge: 2016-10-26 | DRG: 872 | Disposition: A | Discharge status: Home / Self Care | Payer: Medicaid Other | Attending: Internal Medicine | Admitting: Internal Medicine

## 2016-10-23 ENCOUNTER — Emergency Department (HOSPITAL_COMMUNITY): Payer: Medicaid Other

## 2016-10-23 ENCOUNTER — Encounter (HOSPITAL_COMMUNITY): Payer: Self-pay | Admitting: Emergency Medicine

## 2016-10-23 DIAGNOSIS — Z882 Allergy status to sulfonamides status: Secondary | ICD-10-CM | POA: Diagnosis not present

## 2016-10-23 DIAGNOSIS — L89101 Pressure ulcer of unspecified part of back, stage 1: Secondary | ICD-10-CM | POA: Diagnosis present

## 2016-10-23 DIAGNOSIS — E876 Hypokalemia: Secondary | ICD-10-CM | POA: Diagnosis not present

## 2016-10-23 DIAGNOSIS — E1165 Type 2 diabetes mellitus with hyperglycemia: Secondary | ICD-10-CM | POA: Diagnosis present

## 2016-10-23 DIAGNOSIS — Z8249 Family history of ischemic heart disease and other diseases of the circulatory system: Secondary | ICD-10-CM | POA: Diagnosis not present

## 2016-10-23 DIAGNOSIS — Z6841 Body Mass Index (BMI) 40.0 and over, adult: Secondary | ICD-10-CM | POA: Diagnosis not present

## 2016-10-23 DIAGNOSIS — A419 Sepsis, unspecified organism: Secondary | ICD-10-CM | POA: Diagnosis present

## 2016-10-23 DIAGNOSIS — E119 Type 2 diabetes mellitus without complications: Secondary | ICD-10-CM

## 2016-10-23 DIAGNOSIS — L02416 Cutaneous abscess of left lower limb: Secondary | ICD-10-CM

## 2016-10-23 DIAGNOSIS — L03116 Cellulitis of left lower limb: Secondary | ICD-10-CM

## 2016-10-23 DIAGNOSIS — F329 Major depressive disorder, single episode, unspecified: Secondary | ICD-10-CM | POA: Diagnosis present

## 2016-10-23 DIAGNOSIS — Z806 Family history of leukemia: Secondary | ICD-10-CM

## 2016-10-23 DIAGNOSIS — Z833 Family history of diabetes mellitus: Secondary | ICD-10-CM | POA: Diagnosis not present

## 2016-10-23 DIAGNOSIS — L03119 Cellulitis of unspecified part of limb: Secondary | ICD-10-CM

## 2016-10-23 DIAGNOSIS — L02419 Cutaneous abscess of limb, unspecified: Secondary | ICD-10-CM | POA: Diagnosis not present

## 2016-10-23 DIAGNOSIS — F419 Anxiety disorder, unspecified: Secondary | ICD-10-CM | POA: Diagnosis present

## 2016-10-23 DIAGNOSIS — G4730 Sleep apnea, unspecified: Secondary | ICD-10-CM | POA: Diagnosis present

## 2016-10-23 DIAGNOSIS — G5602 Carpal tunnel syndrome, left upper limb: Secondary | ICD-10-CM | POA: Diagnosis present

## 2016-10-23 DIAGNOSIS — Z794 Long term (current) use of insulin: Secondary | ICD-10-CM | POA: Diagnosis not present

## 2016-10-23 DIAGNOSIS — E662 Morbid (severe) obesity with alveolar hypoventilation: Secondary | ICD-10-CM | POA: Diagnosis present

## 2016-10-23 DIAGNOSIS — L8990 Pressure ulcer of unspecified site, unspecified stage: Secondary | ICD-10-CM | POA: Insufficient documentation

## 2016-10-23 DIAGNOSIS — Z91040 Latex allergy status: Secondary | ICD-10-CM | POA: Diagnosis not present

## 2016-10-23 DIAGNOSIS — I890 Lymphedema, not elsewhere classified: Secondary | ICD-10-CM | POA: Diagnosis present

## 2016-10-23 LAB — CBC WITH DIFFERENTIAL/PLATELET
Basophils Absolute: 0 10*3/uL (ref 0.0–0.1)
Basophils Relative: 0 %
EOS ABS: 0.1 10*3/uL (ref 0.0–0.7)
EOS PCT: 1 %
HCT: 43.3 % (ref 36.0–46.0)
Hemoglobin: 14.5 g/dL (ref 12.0–15.0)
LYMPHS ABS: 1.1 10*3/uL (ref 0.7–4.0)
LYMPHS PCT: 10 %
MCH: 28.7 pg (ref 26.0–34.0)
MCHC: 33.5 g/dL (ref 30.0–36.0)
MCV: 85.6 fL (ref 78.0–100.0)
Monocytes Absolute: 0.3 10*3/uL (ref 0.1–1.0)
Monocytes Relative: 3 %
Neutro Abs: 9.1 10*3/uL — ABNORMAL HIGH (ref 1.7–7.7)
Neutrophils Relative %: 86 %
PLATELETS: 200 10*3/uL (ref 150–400)
RBC: 5.06 MIL/uL (ref 3.87–5.11)
RDW: 14.8 % (ref 11.5–15.5)
WBC: 10.5 10*3/uL (ref 4.0–10.5)

## 2016-10-23 LAB — COMPREHENSIVE METABOLIC PANEL
ALBUMIN: 3.8 g/dL (ref 3.5–5.0)
ALT: 26 U/L (ref 14–54)
AST: 24 U/L (ref 15–41)
Alkaline Phosphatase: 93 U/L (ref 38–126)
Anion gap: 13 (ref 5–15)
BILIRUBIN TOTAL: 1 mg/dL (ref 0.3–1.2)
BUN: 13 mg/dL (ref 6–20)
CHLORIDE: 99 mmol/L — AB (ref 101–111)
CO2: 23 mmol/L (ref 22–32)
CREATININE: 0.93 mg/dL (ref 0.44–1.00)
Calcium: 9.6 mg/dL (ref 8.9–10.3)
GFR calc Af Amer: >60 mL/min (ref >60)
GLUCOSE: 360 mg/dL — AB (ref 65–99)
POTASSIUM: 3.8 mmol/L (ref 3.5–5.1)
SODIUM: 135 mmol/L (ref 135–145)
Total Protein: 8.5 g/dL — ABNORMAL HIGH (ref 6.5–8.1)

## 2016-10-23 LAB — URINALYSIS, ROUTINE W REFLEX MICROSCOPIC
Bilirubin Urine: NEGATIVE
Hgb urine dipstick: NEGATIVE
KETONES UR: NEGATIVE mg/dL
Nitrite: NEGATIVE
PROTEIN: NEGATIVE mg/dL
Specific Gravity, Urine: 1.02 (ref 1.005–1.030)
pH: 5 (ref 5.0–8.0)

## 2016-10-23 LAB — GLUCOSE, CAPILLARY
Glucose-Capillary: 358 mg/dL — ABNORMAL HIGH (ref 65–99)
Glucose-Capillary: 256 mg/dL — ABNORMAL HIGH (ref 65–99)

## 2016-10-23 LAB — PROTIME-INR
INR: 1.1
PROTHROMBIN TIME: 14.2 s (ref 11.4–15.2)

## 2016-10-23 LAB — LACTIC ACID, PLASMA
LACTIC ACID, VENOUS: 2.8 mmol/L — AB (ref 0.5–1.9)
Lactic Acid, Venous: 3.8 mmol/L (ref 0.5–1.9)

## 2016-10-23 LAB — CBG MONITORING, ED: GLUCOSE-CAPILLARY: 348 mg/dL — AB (ref 65–99)

## 2016-10-23 MED ORDER — INSULIN ASPART 100 UNIT/ML ~~LOC~~ SOLN
0.0000 [IU] | Freq: Every day | SUBCUTANEOUS | Status: DC
  Administered 2016-10-23: 3 [IU] via SUBCUTANEOUS

## 2016-10-23 MED ORDER — PIPERACILLIN-TAZOBACTAM 3.375 G IVPB
3.3750 g | Freq: Three times a day (TID) | INTRAVENOUS | Status: DC
  Administered 2016-10-23 – 2016-10-26 (×9): 3.375 g via INTRAVENOUS
  Filled 2016-10-23 (×8): qty 50

## 2016-10-23 MED ORDER — DIPHENHYDRAMINE HCL 50 MG/ML IJ SOLN
25.0000 mg | Freq: Once | INTRAMUSCULAR | Status: AC
  Administered 2016-10-23: 25 mg via INTRAVENOUS
  Filled 2016-10-23: qty 1

## 2016-10-23 MED ORDER — VANCOMYCIN HCL 10 G IV SOLR
2500.0000 mg | Freq: Once | INTRAVENOUS | Status: AC
  Administered 2016-10-23: 2500 mg via INTRAVENOUS
  Filled 2016-10-23: qty 2500

## 2016-10-23 MED ORDER — HEPARIN SODIUM (PORCINE) 5000 UNIT/ML IJ SOLN
7500.0000 [IU] | Freq: Three times a day (TID) | INTRAMUSCULAR | Status: DC
  Administered 2016-10-23 – 2016-10-26 (×9): 7500 [IU] via SUBCUTANEOUS
  Filled 2016-10-23 (×9): qty 2

## 2016-10-23 MED ORDER — INSULIN ASPART 100 UNIT/ML ~~LOC~~ SOLN
10.0000 [IU] | Freq: Once | SUBCUTANEOUS | Status: AC
  Administered 2016-10-23: 10 [IU] via SUBCUTANEOUS
  Filled 2016-10-23: qty 1

## 2016-10-23 MED ORDER — SODIUM CHLORIDE 0.9 % IV BOLUS (SEPSIS)
1000.0000 mL | Freq: Once | INTRAVENOUS | Status: AC
  Administered 2016-10-23: 1000 mL via INTRAVENOUS

## 2016-10-23 MED ORDER — GABAPENTIN 300 MG PO CAPS
600.0000 mg | ORAL_CAPSULE | Freq: Three times a day (TID) | ORAL | Status: DC
  Administered 2016-10-23 – 2016-10-26 (×10): 600 mg via ORAL
  Filled 2016-10-23 (×10): qty 2

## 2016-10-23 MED ORDER — SODIUM CHLORIDE 0.9 % IV BOLUS (SEPSIS): 3000.0000 mL | Freq: Once | INTRAVENOUS | Status: DC

## 2016-10-23 MED ORDER — INSULIN ASPART 100 UNIT/ML ~~LOC~~ SOLN
6.0000 [IU] | Freq: Three times a day (TID) | SUBCUTANEOUS | Status: DC
  Administered 2016-10-23 – 2016-10-24 (×3): 6 [IU] via SUBCUTANEOUS

## 2016-10-23 MED ORDER — IBUPROFEN 800 MG PO TABS
800.0000 mg | ORAL_TABLET | Freq: Once | ORAL | Status: AC
  Administered 2016-10-24: 800 mg via ORAL
  Filled 2016-10-23: qty 1

## 2016-10-23 MED ORDER — VANCOMYCIN HCL 10 G IV SOLR
1500.0000 mg | Freq: Three times a day (TID) | INTRAVENOUS | Status: DC
  Administered 2016-10-23 – 2016-10-26 (×8): 1500 mg via INTRAVENOUS
  Filled 2016-10-23 (×12): qty 1500

## 2016-10-23 MED ORDER — VANCOMYCIN HCL IN DEXTROSE 1-5 GM/200ML-% IV SOLN: 1000.0000 mg | Freq: Once | INTRAVENOUS | Status: DC

## 2016-10-23 MED ORDER — INSULIN GLARGINE 100 UNIT/ML ~~LOC~~ SOLN
50.0000 [IU] | Freq: Every day | SUBCUTANEOUS | Status: DC
  Administered 2016-10-23: 50 [IU] via SUBCUTANEOUS
  Filled 2016-10-23 (×2): qty 0.5

## 2016-10-23 MED ORDER — PIPERACILLIN-TAZOBACTAM 3.375 G IVPB 30 MIN
3.3750 g | Freq: Once | INTRAVENOUS | Status: AC
  Administered 2016-10-23: 3.375 g via INTRAVENOUS
  Filled 2016-10-23: qty 50

## 2016-10-23 MED ORDER — ONDANSETRON HCL 4 MG/2ML IJ SOLN
4.0000 mg | Freq: Four times a day (QID) | INTRAMUSCULAR | Status: DC | PRN
  Administered 2016-10-24 – 2016-10-25 (×3): 4 mg via INTRAVENOUS
  Filled 2016-10-23 (×3): qty 2

## 2016-10-23 MED ORDER — ACETAMINOPHEN 325 MG PO TABS
650.0000 mg | ORAL_TABLET | Freq: Once | ORAL | Status: AC
  Administered 2016-10-23: 650 mg via ORAL
  Filled 2016-10-23: qty 2

## 2016-10-23 MED ORDER — ACETAMINOPHEN 325 MG PO TABS
650.0000 mg | ORAL_TABLET | Freq: Four times a day (QID) | ORAL | Status: DC | PRN
  Administered 2016-10-23 – 2016-10-26 (×4): 650 mg via ORAL
  Filled 2016-10-23 (×4): qty 2

## 2016-10-23 MED ORDER — ONDANSETRON HCL 4 MG PO TABS: 4.0000 mg | ORAL_TABLET | Freq: Four times a day (QID) | ORAL | Status: DC | PRN

## 2016-10-23 MED ORDER — ENSURE ENLIVE PO LIQD: 237.0000 mL | Freq: Two times a day (BID) | ORAL | Status: DC

## 2016-10-23 MED ORDER — ACETAMINOPHEN 650 MG RE SUPP: 650.0000 mg | Freq: Four times a day (QID) | RECTAL | Status: DC | PRN

## 2016-10-23 MED ORDER — INSULIN ASPART 100 UNIT/ML ~~LOC~~ SOLN
0.0000 [IU] | Freq: Three times a day (TID) | SUBCUTANEOUS | Status: DC
  Administered 2016-10-23: 20 [IU] via SUBCUTANEOUS
  Administered 2016-10-24 (×3): 7 [IU] via SUBCUTANEOUS
  Administered 2016-10-25 – 2016-10-26 (×4): 4 [IU] via SUBCUTANEOUS

## 2016-10-23 MED ORDER — SODIUM CHLORIDE 0.9 % IV SOLN
1000.0000 mL | INTRAVENOUS | Status: DC
  Administered 2016-10-23 – 2016-10-26 (×6): 1000 mL via INTRAVENOUS

## 2016-10-23 NOTE — H&P (Signed)
History and Physical    Brittany [name]:[id number] DOB: 03/16/1981 DOA: 10/23/2016  PCP: Louie Boston, MD  Patient coming from: Home.    Chief Complaint:   Cellulitis left lower leg.   HPI: Brittany [name] is an 36 y.o. female with hx of morbid obesity, hx of anxiety and depression, lower extremity lymphadema, sleep apnea, not CPAP compliant, DM on insulin, hx of recurrent cellulitis, presented to the ER with increase redness and swelling of her left lower leg.  She was found to have BS of 360, normal renal Fx tests, and no leukocytosis.  She has a fever of 101.2, and resulting tachycardia.  She was thought to be hypotensive, but proper BP measurements did not confirmed hypotension.  She was started on IV Van/Zosyn, given IVF, and hospitalist was asked to admit her for cellulitis.  Her lactic acid was slightly elevated.    ED Course:  See above.  Past Medical History:  Diagnosis Date  . Anxiety   . Carpal tunnel syndrome on left 10/12/2016  . Cellulitis and abscess of leg   . Depression   . Diabetes mellitus without complication (HCC)   . Lymphedema   . Sleep apnea    DX with sleep apnea - unable to use c-pap  . Umbilical hernia     Rewiew of Systems:  Constitutional: Negative for malaise, fever and chills. No significant weight loss or weight gain Eyes: Negative for eye pain, redness and discharge, diplopia, visual changes, or flashes of light. ENMT: Negative for ear pain, hoarseness, nasal congestion, sinus pressure and sore throat. No headaches; tinnitus, drooling, or problem swallowing. Cardiovascular: Negative for chest pain, palpitations, diaphoresis, dyspnea and peripheral edema. ; No orthopnea, PND Respiratory: Negative for cough, hemoptysis, wheezing and stridor. No pleuritic chestpain. Gastrointestinal: Negative for nausea, vomiting, diarrhea, constipation, abdominal pain, melena, blood in stool, hematemesis, jaundice and rectal bleeding.    Genitourinary: Negative  for frequency, dysuria, incontinence,flank pain and hematuria; Musculoskeletal: Negative for back pain and neck pain. Negative for swelling and trauma.;  Skin: . Negative for pruritus, abrasions, bruising  ulcerations Neuro: Negative for headache, lightheadedness and neck stiffness. Negative for weakness, altered level of consciousness , altered mental status, extremity weakness, burning feet, involuntary movement, seizure and syncope.  Psych: negative for anxiety, depression, insomnia, tearfulness, panic attacks, hallucinations, paranoia, suicidal or homicidal ideation   Past Surgical History:  Procedure Laterality Date  . CESAREAN SECTION  11/09/01  . INSERTION OF MESH N/A 01/02/2013   Procedure: INSERTION OF MESH;  Surgeon: Lodema Pilot, DO;  Location: [facility];  Service: General;  Laterality: N/A;  . UMBILICAL HERNIA REPAIR N/A 01/02/2013   Procedure: LAPAROSCOPIC UMBILICAL HERNIA;  Surgeon: Lodema Pilot, DO;  Location: [facility];  Service: General;  Laterality: N/A;     reports that she has never smoked. She has never used smokeless tobacco. She reports that she does not drink alcohol or use drugs.  Allergies  Allergen Reactions  . Latex Itching, Swelling and Rash  . Sulfa Antibiotics Nausea And Vomiting and Rash    Family History  Problem Relation Age of Onset  . Cancer Mother     leukemia  . Diabetes Mother   . Hypertension Mother   . Hyperlipidemia Mother   . Other Father     Sciatica     Prior to Admission medications   Medication Sig Start Date End Date Taking? Authorizing Provider  gabapentin (NEURONTIN) 300 MG capsule Take 2 capsules (600 mg total) by mouth 3 (  three) times daily. 10/06/16  Yes Anson Fret, MD  insulin aspart (NOVOLOG) 100 UNIT/ML injection SLIDING SCALE 3 TIMES DAILY WITH MEALS: For blood sugar 70-120-no insulin. Blood sugar 121-150 take 5 units. Blood sugar 151-208 take 8 units. Blood sugar 201-250 take 12 units. Blood sugar 251-300 take 17 units. Blood  sugar 301-350 take 20 units. Blood sugar 351-400 take 25 units. Blood sugar greater than 400 take 28 units and call your doctor. 03/27/16  Yes Devoria Albe, MD  Insulin Glargine (LANTUS SOLOSTAR) 100 UNIT/ML Solostar Pen Inject 50 Units into the skin at bedtime. 03/27/16  Yes Devoria Albe, MD  naproxen (NAPROSYN) 500 MG tablet Take 1 tablet (500 mg total) by mouth 2 (two) times daily with a meal. Patient not taking: Reported on 10/23/2016 10/01/16   Pricilla Loveless, MD    Physical Exam: Vitals:   10/23/16 1000 10/23/16 1030 10/23/16 1100 10/23/16 1117  BP: (!) 103/49 (!) 114/57 (!) 100/53   Pulse: (!) 120 (!) 117 (!) 115   Resp: (!) 35     Temp:    (!) 101.2 F (38.4 C)  TempSrc:    Oral  SpO2: 97% 94% 97%   Weight:      Height:          Constitutional: NAD, calm, comfortable Vitals:   10/23/16 1000 10/23/16 1030 10/23/16 1100 10/23/16 1117  BP: (!) 103/49 (!) 114/57 (!) 100/53   Pulse: (!) 120 (!) 117 (!) 115   Resp: (!) 35     Temp:    (!) 101.2 F (38.4 C)  TempSrc:    Oral  SpO2: 97% 94% 97%   Weight:      Height:       Eyes: PERRL, lids and conjunctivae normal ENMT: Mucous membranes are moist. Posterior pharynx clear of any exudate or lesions.Normal dentition.  Neck: normal, supple, no masses, no thyromegaly Respiratory: clear to auscultation bilaterally, no wheezing, no crackles. Normal respiratory effort. No accessory muscle use.  Cardiovascular: Regular rate and rhythm, no murmurs / rubs / gallops. No extremity edema. 2+ pedal pulses. No carotid bruits.  Abdomen: no tenderness, no masses palpated. No hepatosplenomegaly. Bowel sounds positive.  Musculoskeletal: no clubbing / cyanosis. No joint deformity upper and lower extremities. Good ROM, no contractures. Normal muscle tone.  Skin: erythema of the left lower extremity.  She has erythema of the lower extremity.  This was demarcated.  Neurologic: CN 2-12 grossly intact. Sensation intact, DTR normal. Strength 5/5 in all 4.    Psychiatric: Normal judgment and insight. Alert and oriented x 3. Normal mood.     Labs on Admission: I have personally reviewed following labs and imaging studies  CBC:  Recent Labs Lab 10/23/16 0806  WBC 10.5  NEUTROABS 9.1*  HGB 14.5  HCT 43.3  MCV 85.6  PLT 200   Basic Metabolic Panel:  Recent Labs Lab 10/23/16 0806  NA 135  K 3.8  CL 99*  CO2 23  GLUCOSE 360*  BUN 13  CREATININE 0.93  CALCIUM 9.6   GFR: Estimated Creatinine Clearance: 182.7 mL/min (by C-G formula based on SCr of 0.93 mg/dL). Liver Function Tests:  Recent Labs Lab 10/23/16 0806  AST 24  ALT 26  ALKPHOS 93  BILITOT 1.0  PROT 8.5*  ALBUMIN 3.8   Coagulation Profile:  Recent Labs Lab 10/23/16 0806  INR 1.10   Urine analysis:    Component Value Date/Time   COLORURINE YELLOW 10/04/2015 0942   APPEARANCEUR CLEAR 10/04/2015 0942  LABSPEC 1.030 10/04/2015 0942   PHURINE 5.0 10/04/2015 0942   GLUCOSEU >1000 (A) 10/04/2015 0942   HGBUR NEGATIVE 10/04/2015 0942   BILIRUBINUR NEGATIVE 10/04/2015 0942   KETONESUR NEGATIVE 10/04/2015 0942   PROTEINUR NEGATIVE 10/04/2015 0942   UROBILINOGEN 0.2 02/22/2013 0414   NITRITE NEGATIVE 10/04/2015 0942   LEUKOCYTESUR NEGATIVE 10/04/2015 0942    Recent Results (from the past 240 hour(s))  Culture, blood (Routine X 2) w Reflex to ID Panel     Status: None (Preliminary result)   Collection Time: 10/23/16  7:36 AM  Result Value Ref Range Status   Specimen Description BLOOD BLOOD RIGHT HAND BOTTLES DRAWN AEROBIC ONLY  Final   Special Requests   Final    Blood Culture results may not be optimal due to an inadequate volume of blood received in culture bottles   Culture PENDING  Incomplete   Report Status PENDING  Incomplete  Culture, blood (Routine X 2) w Reflex to ID Panel     Status: None (Preliminary result)   Collection Time: 10/23/16  8:07 AM  Result Value Ref Range Status   Specimen Description   Final    BLOOD LEFT ANTECUBITAL  BOTTLES DRAWN AEROBIC AND ANAEROBIC   Special Requests   Final    Blood Culture results may not be optimal due to an inadequate volume of blood received in culture bottles   Culture PENDING  Incomplete   Report Status PENDING  Incomplete     Radiological Exams on Admission: Dg Chest 2 View  Result Date: 10/23/2016 CLINICAL DATA:  Could sepsis, LEFT upper thigh cellulitis EXAM: CHEST  2 VIEW COMPARISON:  08/20/2016 FINDINGS: Normal mediastinum and cardiac silhouette. Normal pulmonary vasculature. No evidence of effusion, infiltrate, or pneumothorax. No acute bony abnormality. IMPRESSION: No evidence of pulmonary infection Electronically Signed   By: Genevive Bi M.D.   On: 10/23/2016 09:00   [place] Lt Lower Extrem Ltd Soft Tissue Non Vascular  Result Date: 10/23/2016 CLINICAL DATA:  Left thigh edema. Question abscess. Morbid obesity. EXAM: ULTRASOUND LEFT LOWER EXTREMITY LIMITED TECHNIQUE: Ultrasound examination of the lower extremity soft tissues was performed in the area of clinical concern. COMPARISON:  None. FINDINGS: Throughout the left anterior and medial thigh, there is subcutaneous and deep intramuscular edema, without well-defined fluid collection to suggest abscess. IMPRESSION: No evidence of abscess. Edema throughout the anteromedial left thigh is likely related to cellulitis. Electronically Signed   By: Jeronimo Greaves M.D.   On: 10/23/2016 11:28    EKG: Independently reviewed.   Assessment/Plan Principal Problem:   Cellulitis and abscess of leg Active Problems:   Sleep apnea   Diabetes mellitus without complication (HCC)   Obesity, Class III, BMI 40-49.9 (morbid obesity) (HCC)   Cellulitis    PLAN:   Cellulitis:  [place] without any evidence of abscess.  Will continue with IV Van/Zosyn.  Area was demarcated for comparison.  She is not septic.  DM:  Will continue with Lantus at 50 u, SSI resistant, and meal coverage.   OSA:  Non cpap wearer.  Obesity:  Noted.    DVT  prophylaxis: sub Q Heparin.  Code Status: full code.  Family Communication: husband at bedside.  Disposition Plan: home when appropriate.  Consults called: None.  Admission status: OBS.    Kyerra Vargo MD FACP. [facility]  If 7PM-7AM, please contact night-coverage [email or web address] Password [facility]  10/23/2016, 11:35 AM

## 2016-10-23 NOTE — ED Notes (Signed)
Pt taken to XR.  

## 2016-10-23 NOTE — Progress Notes (Signed)
Pharmacy Antibiotic Note  Brittany [name] is a 36 y.o. female admitted on 10/23/2016 with cellulitis.  Pharmacy has been consulted for Vancomycin and Zosyn dosing.  Plan: Vancomycin 2500 mg IV loading dose Vancomycin 1500 IV every 8 hours.  Goal trough 10-15 mcg/mL. Zosyn 3.375g IV q8h (4 hour infusion).  Monitor renal function Labs per protocol  Height:  (180.3 cm) Weight: (!) 536 lb (243.1 kg) IBW/kg (Calculated) : 70.8  Temp (24hrs), Avg:102 F (38.9 C), Min:101.6 F (38.7 C), Max:102.3 F (39.1 C)   Recent Labs Lab 10/23/16 0806  WBC 10.5  CREATININE 0.93  LATICACIDVEN 3.8*    Estimated Creatinine Clearance: 182.7 mL/min (by C-G formula based on SCr of 0.93 mg/dL).    Allergies  Allergen Reactions  . Latex Itching, Swelling and Rash  . Sulfa Antibiotics Nausea And Vomiting and Rash    Antimicrobials this admission: Zosyn 4/15 >>  Vancomycin 4/15 >>     Thank you for allowing pharmacy to be a part of this patient's care.  Josephine Igo 10/23/2016 10:30 AM

## 2016-10-23 NOTE — ED Notes (Signed)
Dr. Deretha Emory notified this nurse not to begin the 3000 bolus unless pressure does not increase with first two fluid boluses.  Order kept in computer should pt need more fluid.

## 2016-10-23 NOTE — ED Notes (Signed)
Pt resting comfortably

## 2016-10-23 NOTE — ED Notes (Signed)
Date and time results received: 10/23/16 1144   Test: lactic acid Critical Value: 2.8  Name of Provider Notified: Dr Conley Rolls  Orders Received? Or Actions Taken?: notified provider

## 2016-10-23 NOTE — ED Notes (Signed)
IV vancomycin tubing connected to right AC IV site due to burning in right hand.

## 2016-10-23 NOTE — ED Notes (Addendum)
Pt mentions burning and itching above right hand IV site where vancomycin is entering.  Pt also has slight redness above IV site. Dr. Deretha Emory notified and gave verbal order for benedryl  IV.

## 2016-10-23 NOTE — ED Provider Notes (Addendum)
[facility] DEPT Provider Note   CSN: [id number] Arrival date & time: 10/23/16  4098     History   Chief Complaint Chief Complaint  Patient presents with  . Recurrent Skin Infections    HPI Brittany [name] is a 36 y.o. female.  The patient felt fine yesterday. Woke this morning feeling fatigued fever and chills in sudden worsening redness to her left thigh area. Both lower extremities O's have a little bit of redness. Patient's had this happen before and related to cellulitis. Patient denies any other symptoms.      Past Medical History:  Diagnosis Date  . Anxiety   . Carpal tunnel syndrome on left 10/12/2016  . Cellulitis and abscess of leg   . Depression   . Diabetes mellitus without complication (HCC)   . Lymphedema   . Sleep apnea    DX with sleep apnea - unable to use c-pap  . Umbilical hernia     Patient Active Problem List   Diagnosis Date Noted  . Carpal tunnel syndrome on left 10/12/2016  . Candidal intertrigo 12/31/2014  . Morbid obesity (HCC) 12/29/2014  . Hyponatremia 12/29/2014  . Cellulitis of left lower extremity 12/29/2014  . DM (diabetes mellitus), type 2, uncontrolled (HCC) 01/04/2013    Past Surgical History:  Procedure Laterality Date  . CESAREAN SECTION  11/09/01  . INSERTION OF MESH N/A 01/02/2013   Procedure: INSERTION OF MESH;  Surgeon: Lodema Pilot, DO;  Location: [facility];  Service: General;  Laterality: N/A;  . UMBILICAL HERNIA REPAIR N/A 01/02/2013   Procedure: LAPAROSCOPIC UMBILICAL HERNIA;  Surgeon: Lodema Pilot, DO;  Location: [facility];  Service: General;  Laterality: N/A;    OB History    No data available       Home Medications    Prior to Admission medications   Medication Sig Start Date End Date Taking? Authorizing Provider  gabapentin (NEURONTIN) 300 MG capsule Take 2 capsules (600 mg total) by mouth 3 (three) times daily. 10/06/16   Anson Fret, MD  insulin aspart (NOVOLOG) 100 UNIT/ML injection SLIDING SCALE 3 TIMES  DAILY WITH MEALS: For blood sugar 70-120-no insulin. Blood sugar 121-150 take 5 units. Blood sugar 151-208 take 8 units. Blood sugar 201-250 take 12 units. Blood sugar 251-300 take 17 units. Blood sugar 301-350 take 20 units. Blood sugar 351-400 take 25 units. Blood sugar greater than 400 take 28 units and call your doctor. 03/27/16   Devoria Albe, MD  Insulin Glargine (LANTUS SOLOSTAR) 100 UNIT/ML Solostar Pen Inject 50 Units into the skin at bedtime. 03/27/16   Devoria Albe, MD  naproxen (NAPROSYN) 500 MG tablet Take 1 tablet (500 mg total) by mouth 2 (two) times daily with a meal. 10/01/16   Pricilla Loveless, MD  ondansetron (ZOFRAN) 4 MG tablet Take 4 mg by mouth every 8 (eight) hours as needed for nausea or vomiting.    Historical Provider, MD    Family History Family History  Problem Relation Age of Onset  . Cancer Mother     leukemia  . Diabetes Mother   . Hypertension Mother   . Hyperlipidemia Mother   . Other Father     Sciatica    Social History Social History  Substance Use Topics  . Smoking status: Never Smoker  . Smokeless tobacco: Never Used  . Alcohol use No     Allergies   Latex and Sulfa antibiotics   Review of Systems Review of Systems  Constitutional: Positive for chills, fatigue and  fever.  HENT: Negative for congestion.   Eyes: Negative for redness.  Respiratory: Negative for shortness of breath.   Cardiovascular: Negative for chest pain.  Gastrointestinal: Negative for abdominal pain.  Genitourinary: Negative for dysuria.  Musculoskeletal: Negative for neck stiffness.  Skin: Positive for rash.  Neurological: Negative for headaches.  Hematological: Does not bruise/bleed easily.  Psychiatric/Behavioral: Negative for confusion.     Physical Exam Updated Vital Signs BP (!) 85/74   Pulse (!) 125   Temp (!) 102.3 F (39.1 C) (Oral)   Resp (!) 32   Ht  (1.803 m)   Wt (!) 243.1 kg   SpO2 100%   BMI 74.76 kg/m   Physical Exam  Constitutional: She  is oriented to person, place, and time. She appears well-developed and well-nourished. She appears distressed.  HENT:  Head: Normocephalic and atraumatic.  Mouth/Throat: Oropharynx is clear and moist.  Eyes: EOM are normal. Pupils are equal, round, and reactive to light.  Neck: Normal range of motion. Neck supple.  Cardiovascular: Normal rate, regular rhythm and normal heart sounds.   Pulmonary/Chest: Effort normal and breath sounds normal. No respiratory distress.  Abdominal: Soft. Bowel sounds are normal. There is no tenderness.  Musculoskeletal: She exhibits edema.  Erythema to both lower extremities. Left thigh with area of induration and increased erythema no obvious fluctuance.  Neurological: She is alert and oriented to person, place, and time.  Skin: There is erythema.  Nursing note and vitals reviewed.    ED Treatments / Results  Labs (all labs ordered are listed, but only abnormal results are displayed) Labs Reviewed  COMPREHENSIVE METABOLIC PANEL - Abnormal; Notable for the following:       Result Value   Chloride 99 (*)    Glucose, Bld 360 (*)    Total Protein 8.5 (*)    All other components within normal limits  CBC WITH DIFFERENTIAL/PLATELET - Abnormal; Notable for the following:    Neutro Abs 9.1 (*)    All other components within normal limits  LACTIC ACID, PLASMA - Abnormal; Notable for the following:    Lactic Acid, Venous 3.8 (*)    All other components within normal limits  CULTURE, BLOOD (ROUTINE X 2)  CULTURE, BLOOD (ROUTINE X 2)  PROTIME-INR  URINALYSIS, ROUTINE W REFLEX MICROSCOPIC  LACTIC ACID, PLASMA    EKG  EKG Interpretation  Date/Time:  Sunday October 23 2016 07:49:00 EDT Ventricular Rate:  143 PR Interval:    QRS Duration: 95 QT Interval:  333 QTC Calculation: 514 R Axis:   96 Text Interpretation:  Sinus tachycardia Borderline right axis deviation Low voltage, precordial leads Prolonged QT interval Baseline wander in lead(s) V2 Confirmed  by Kody Brandl  MD, Mattilynn Forrer [phone]) on 10/23/2016 8:23:52 AM       Radiology Dg Chest 2 View  Result Date: 10/23/2016 CLINICAL DATA:  Could sepsis, LEFT upper thigh cellulitis EXAM: CHEST  2 VIEW COMPARISON:  08/20/2016 FINDINGS: Normal mediastinum and cardiac silhouette. Normal pulmonary vasculature. No evidence of effusion, infiltrate, or pneumothorax. No acute bony abnormality. IMPRESSION: No evidence of pulmonary infection Electronically Signed   By: Genevive Bi M.D.   On: 10/23/2016 09:00    Procedures Procedures (including critical care time)   CRITICAL CARE Performed by: Vanetta Mulders Total critical care time: 45 minutes Critical care time was exclusive of separately billable procedures and treating other patients. Critical care was necessary to treat or prevent imminent or life-threatening deterioration. Critical care was time spent personally by me on  the following activities: development of treatment plan with patient and/or surrogate as well as nursing, discussions with consultants, evaluation of patient's response to treatment, examination of patient, obtaining history from patient or surrogate, ordering and performing treatments and interventions, ordering and review of laboratory studies, ordering and review of radiographic studies, pulse oximetry and re-evaluation of patient's condition.   Medications Ordered in ED Medications  0.9 %  sodium chloride infusion (1,000 mLs Intravenous New Bag/Given 10/23/16 0846)  vancomycin (VANCOCIN) 2,500 mg in sodium chloride 0.9 % 500 mL IVPB (2,500 mg Intravenous New Bag/Given 10/23/16 0851)  sodium chloride 0.9 % bolus 3,000 mL (not administered)  acetaminophen (TYLENOL) tablet 650 mg (650 mg Oral Given 10/23/16 0753)  piperacillin-tazobactam (ZOSYN) IVPB 3.375 g (0 g Intravenous Stopped 10/23/16 0915)  sodium chloride 0.9 % bolus 1,000 mL (1,000 mLs Intravenous New Bag/Given 10/23/16 0845)  sodium chloride 0.9 % bolus 1,000 mL (1,000  mLs Intravenous New Bag/Given 10/23/16 0900)  insulin aspart (novoLOG) injection 10 Units (10 Units Subcutaneous Given 10/23/16 0900)  diphenhydrAMINE (BENADRYL) injection 25 mg (25 mg Intravenous Given 10/23/16 0911)     Initial Impression / Assessment and Plan / ED Course  I have reviewed the triage vital signs and the nursing notes.  Pertinent labs & imaging results that were available during my care of the patient were reviewed by me and considered in my medical decision making (see chart for details).   patient arrived just with tachycardia and fever meeting sepsis criteria. Did not meet full fluid challenge because did not have a lactic acid greater than 4 did not have hypotension. However now patient has dropped her pressures down into the 88 systolic. Patient will receive fluid challenge. Normally would receive 30 mL/kg. However patient's weight is over 240 kg. So patient will be given a total of 5 L of fluid. 2 L already ordered. An additional 3 L will be given.  Patient states that this happened before she gets a cellulitis in her adipose tissue in her legs. Currently it's left thigh area. There is some induration. Not able to completely differentiate whether there is an underlying abscess the patient states that she was normal until this morning. Patient currently being hypotensive not stable to have additional radiology follow-up to determine whether there may be an underlying abscess.  Patient started on sepsis protocol antibiotics for sialitis. Following a vancomycin patient got some redness in her arms and was given Benadryl.  Correction appears that the blood pressures were falsely low. Readjustment of the cuff Amer having systolics above 100. We'll continue with a 2 L bolus for now due to the tachycardia and she still tachycardic if her pressures remain stable we'll not give the additional 3 L bolus.  We'll try to see if ultrasounds available to ultrasound the left thigh determine if  there is any underlying abscess.   Final Clinical Impressions(s) / ED Diagnoses   Final diagnoses:  Sepsis, due to unspecified organism [facility])  Cellulitis of left lower extremity    New Prescriptions New Prescriptions   No medications on file     Vanetta Mulders, MD 10/23/16 1610    Vanetta Mulders, MD 10/23/16 1520

## 2016-10-23 NOTE — ED Notes (Signed)
CRITICAL VALUE ALERT  Critical value received:  Lactic acid 3.8 Date of notification:  10/23/16  Time of notification:  0852  Critical value read back:Yes.    Nurse who received alert:  Gertha Calkin  MD notified (1st page):  Dr. Deretha Emory

## 2016-10-23 NOTE — ED Notes (Signed)
Megan, phlebotomy at bedside drawing cultures.

## 2016-10-23 NOTE — ED Triage Notes (Signed)
Patient c/o cellulitis to left leg that started 1.5 hours ago. 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 Left leg red, hot, and tender to touch. Patient reports nausea, vomiting, pain, and chills. Patient tachycardic with temp of 102.3 in triage.

## 2016-10-24 DIAGNOSIS — L8990 Pressure ulcer of unspecified site, unspecified stage: Secondary | ICD-10-CM | POA: Insufficient documentation

## 2016-10-24 LAB — HIV ANTIBODY (ROUTINE TESTING W REFLEX): HIV Screen 4th Generation wRfx: NONREACTIVE

## 2016-10-24 LAB — CBC
HEMATOCRIT: 38.1 % (ref 36.0–46.0)
HEMOGLOBIN: 12.4 g/dL (ref 12.0–15.0)
MCH: 28.1 pg (ref 26.0–34.0)
MCHC: 32.5 g/dL (ref 30.0–36.0)
MCV: 86.2 fL (ref 78.0–100.0)
Platelets: 183 10*3/uL (ref 150–400)
RBC: 4.42 MIL/uL (ref 3.87–5.11)
RDW: 14.8 % (ref 11.5–15.5)
WBC: 11.1 10*3/uL — AB (ref 4.0–10.5)

## 2016-10-24 LAB — GLUCOSE, CAPILLARY
Glucose-Capillary: 212 mg/dL — ABNORMAL HIGH (ref 65–99)
Glucose-Capillary: 228 mg/dL — ABNORMAL HIGH (ref 65–99)
Glucose-Capillary: 204 mg/dL — ABNORMAL HIGH (ref 65–99)
Glucose-Capillary: 163 mg/dL — ABNORMAL HIGH (ref 65–99)

## 2016-10-24 LAB — COMPREHENSIVE METABOLIC PANEL
ALK PHOS: 58 U/L (ref 38–126)
ALT: 19 U/L (ref 14–54)
AST: 18 U/L (ref 15–41)
Albumin: 2.8 g/dL — ABNORMAL LOW (ref 3.5–5.0)
Anion gap: 14 (ref 5–15)
BILIRUBIN TOTAL: 1.2 mg/dL (ref 0.3–1.2)
BUN: 15 mg/dL (ref 6–20)
CALCIUM: 8.1 mg/dL — AB (ref 8.9–10.3)
CO2: 20 mmol/L — ABNORMAL LOW (ref 22–32)
Chloride: 101 mmol/L (ref 101–111)
Creatinine, Ser: 1.17 mg/dL — ABNORMAL HIGH (ref 0.44–1.00)
GFR calc Af Amer: >60 mL/min (ref >60)
GFR calc non Af Amer: 59 mL/min — ABNORMAL LOW (ref >60)
GLUCOSE: 208 mg/dL — AB (ref 65–99)
Potassium: 3 mmol/L — ABNORMAL LOW (ref 3.5–5.1)
Sodium: 135 mmol/L (ref 135–145)
TOTAL PROTEIN: 6.5 g/dL (ref 6.5–8.1)

## 2016-10-24 MED ORDER — INSULIN GLARGINE 100 UNIT/ML ~~LOC~~ SOLN
SUBCUTANEOUS | Status: AC
  Filled 2016-10-24: qty 10

## 2016-10-24 MED ORDER — INSULIN GLARGINE 100 UNIT/ML ~~LOC~~ SOLN
55.0000 [IU] | Freq: Every day | SUBCUTANEOUS | Status: DC
  Administered 2016-10-24 – 2016-10-25 (×2): 55 [IU] via SUBCUTANEOUS
  Filled 2016-10-24 (×3): qty 0.55

## 2016-10-24 MED ORDER — INSULIN ASPART 100 UNIT/ML ~~LOC~~ SOLN
15.0000 [IU] | Freq: Three times a day (TID) | SUBCUTANEOUS | Status: DC
  Administered 2016-10-24 – 2016-10-26 (×7): 15 [IU] via SUBCUTANEOUS

## 2016-10-24 NOTE — Plan of Care (Signed)
Problem: Physical Regulation: Goal: Ability to maintain clinical measurements within normal limits will improve Outcome: Not Progressing See vital sign doc flowsheet.  Problem: Tissue Perfusion: Goal: Risk factors for ineffective tissue perfusion will decrease Outcome: Progressing VTE is Heparin

## 2016-10-24 NOTE — Progress Notes (Signed)
Inpatient Diabetes Program Recommendations  AACE/ADA: New Consensus Statement on Inpatient Glycemic Control (2015)  Target Ranges:  Prepandial:   less than 140 mg/dL      Peak postprandial:   less than 180 mg/dL (1-2 hours)      Critically ill patients:  140 - 180 mg/dL  Results for JNAYA, BUTRICK (MRN [id number]) as of 10/24/2016 09:32  Ref. Range 10/23/2016 12:16 10/23/2016 16:29 10/23/2016 22:09 10/24/2016 08:02  Glucose-Capillary Latest Ref Range: 65 - 99 mg/dL 409 (H) 811 (H) 914 (H) 212 (H)    Review of Glycemic Control  Diabetes history: DM2 Outpatient Diabetes medications: Lantus 50 units QHS, Novolog 0-28 units TID with meals Current orders for Inpatient glycemic control: Lantus 50 units QHS, Novolog 0-20 units TID with meals, Novolog 0-5 units QHS, Novolog 6 units TID with meals  Inpatient Diabetes Program Recommendations: Insulin - Basal: Please consider increasing Lantus to 55 units QHS. Insulin - Meal Coverage: Please consider increasing meal coverage to Novolog 15 units TID with meals if patient eats at least 50% of meals. HgbA1C: Please add on an A1C to blood in lab or draw with next scheduled blood draw to evaluate glycemic control over the past 2-3 months.  Thanks, Orlando Penner, RN, MSN, CDE Diabetes Coordinator Inpatient Diabetes Program [phone] (Team Pager from 8am to 5pm)

## 2016-10-24 NOTE — Progress Notes (Signed)
PROGRESS NOTE    CHIQUETTA [name]  ZOX:[id number] DOB: 17-Jul-1980 DOA: 10/23/2016 PCP: Louie Boston, MD    Brief Narrative: Brittany [name] is an 36 y.o. female with hx of morbid obesity, hx of anxiety and depression, lower extremity lymphadema, sleep apnea, not CPAP compliant, DM on insulin, hx of recurrent cellulitis, presented to the ER with increase redness and swelling of her left lower leg.  She was found to have BS of 360, normal renal Fx tests, and no leukocytosis.  She has a fever of 101.2, and resulting tachycardia.  She was thought to be hypotensive, but proper BP measurements did not confirmed hypotension.  She was started on IV Van/Zosyn, given IVF, and hospitalist was asked to admit her for cellulitis.  Her lactic acid was slightly elevated.  She is slightly better.  Hyperglycemia is being address with increasing insulin.    Assessment & Plan:   Principal Problem:   Cellulitis and abscess of leg Active Problems:   Sleep apnea   Diabetes mellitus without complication (HCC)   Obesity, Class III, BMI 40-49.9 (morbid obesity) (HCC)   Cellulitis   Pressure injury of skin   Cellulitis:  [place] without any evidence of abscess.  Will continue with IV Van/Zosyn.  Area was demarcated for comparison.  She is not septic.  DM:  Will adjust insulin., SSI resistant, and meal coverages.   OSA:  Non cpap wearer.  Obesity:  Noted.    DVT prophylaxis: sub Q Heparin.  Code Status: full code.  Family Communication: husband at bedside.  Disposition Plan: home when appropriate.  Consults called: None.  Admission status: OBS.     Antimicrobials: Anti-infectives    Start     Dose/Rate Route Frequency Ordered Stop   10/23/16 1700  vancomycin (VANCOCIN) 1,500 mg in sodium chloride 0.9 % 500 mL IVPB     1,500 mg 250 mL/hr over 120 Minutes Intravenous Every 8 hours 10/23/16 1029     10/23/16 1600  piperacillin-tazobactam (ZOSYN) IVPB 3.375 g     3.375 g 12.5 mL/hr over 240 Minutes  Intravenous Every 8 hours 10/23/16 1029     10/23/16 0830  piperacillin-tazobactam (ZOSYN) IVPB 3.375 g     3.375 g 100 mL/hr over 30 Minutes Intravenous  Once 10/23/16 0820 10/23/16 0915   10/23/16 0830  vancomycin (VANCOCIN) IVPB 1000 mg/200 mL premix  Status:  Discontinued     1,000 mg 200 mL/hr over 60 Minutes Intravenous  Once 10/23/16 0820 10/23/16 0822   10/23/16 0830  vancomycin (VANCOCIN) 2,500 mg in sodium chloride 0.9 % 500 mL IVPB     2,500 mg 250 mL/hr over 120 Minutes Intravenous  Once 10/23/16 0822 10/23/16 1110       Subjective:  Feeling better with less pain.   Objective: Vitals:   10/23/16 1435 10/23/16 2211 10/24/16 0524 10/24/16 1417  BP: (!) 144/75 97/73 (!) 102/54 136/79  Pulse: (!) 122 (!) 130 (!) 117 (!) 112  Resp:  (!) 25 (!) 25 (!) 21  Temp: (!) 100.5 F (38.1 C) (!) 100.7 F (38.2 C) 98.3 F (36.8 C) 99.4 F (37.4 C)  TempSrc: Oral Oral Oral Oral  SpO2: 100% 100% 99% 100%  Weight: (!) 238.1 kg (525 lb)     Height:  (1.803 m)       Intake/Output Summary (Last 24 hours) at 10/24/16 1503 Last data filed at 10/24/16 1418  Gross per 24 hour  Intake  5774.17 ml  Output             1900 ml  Net          3874.17 ml   Filed Weights   10/23/16 0745 10/23/16 1435  Weight: (!) 243.1 kg (536 lb) (!) 238.1 kg (525 lb)    Examination:  General exam: Appears calm and comfortable  Respiratory system: Clear to auscultation. Respiratory effort normal. Cardiovascular system: S1 & S2 heard, RRR. No JVD, murmurs, rubs, gallops or clicks. No pedal edema. Gastrointestinal system: Abdomen is nondistended, soft and nontender. No organomegaly or masses felt. Normal bowel sounds heard. Central nervous system: Alert and oriented. No focal neurological deficits. Extremities: Symmetric 5 x 5 power. Skin: No rashes, lesions or ulcers Psychiatry: Judgement and insight appear normal. Mood & affect appropriate.   Data Reviewed: I have personally reviewed  following labs and imaging studies  CBC:  Recent Labs Lab 10/23/16 0806 10/24/16 0435  WBC 10.5 11.1*  NEUTROABS 9.1*  --   HGB 14.5 12.4  HCT 43.3 38.1  MCV 85.6 86.2  PLT 200 183   Basic Metabolic Panel:  Recent Labs Lab 10/23/16 0806 10/24/16 0435  NA 135 135  K 3.8 3.0*  CL 99* 101  CO2 23 20*  GLUCOSE 360* 208*  BUN 13 15  CREATININE 0.93 1.17*  CALCIUM 9.6 8.1*   GFR: Estimated Creatinine Clearance: 143.1 mL/min (A) (by C-G formula based on SCr of 1.17 mg/dL (H)). Liver Function Tests:  Recent Labs Lab 10/23/16 0806 10/24/16 0435  AST 24 18  ALT 26 19  ALKPHOS 93 58  BILITOT 1.0 1.2  PROT 8.5* 6.5  ALBUMIN 3.8 2.8*   Coagulation Profile:  Recent Labs Lab 10/23/16 0806  INR 1.10  CBG:  Recent Labs Lab 10/23/16 1216 10/23/16 1629 10/23/16 2209 10/24/16 0802 10/24/16 1201  GLUCAP 348* 358* 256* 212* 228*    Recent Labs Lab 10/23/16 0806 10/23/16 1054  LATICACIDVEN 3.8* 2.8*    Recent Results (from the past 240 hour(s))  Culture, blood (Routine X 2) w Reflex to ID Panel     Status: None (Preliminary result)   Collection Time: 10/23/16  7:36 AM  Result Value Ref Range Status   Specimen Description BLOOD BLOOD RIGHT HAND BOTTLES DRAWN AEROBIC ONLY  Final   Special Requests   Final    Blood Culture results may not be optimal due to an inadequate volume of blood received in culture bottles   Culture NO GROWTH 1 DAY  Final   Report Status PENDING  Incomplete  Culture, blood (Routine X 2) w Reflex to ID Panel     Status: None (Preliminary result)   Collection Time: 10/23/16  8:07 AM  Result Value Ref Range Status   Specimen Description   Final    BLOOD LEFT ANTECUBITAL BOTTLES DRAWN AEROBIC AND ANAEROBIC   Special Requests   Final    Blood Culture results may not be optimal due to an inadequate volume of blood received in culture bottles   Culture NO GROWTH 1 DAY  Final   Report Status PENDING  Incomplete     Radiology Studies: Dg  Chest 2 View  Result Date: 10/23/2016 CLINICAL DATA:  Could sepsis, LEFT upper thigh cellulitis EXAM: CHEST  2 VIEW COMPARISON:  08/20/2016 FINDINGS: Normal mediastinum and cardiac silhouette. Normal pulmonary vasculature. No evidence of effusion, infiltrate, or pneumothorax. No acute bony abnormality. IMPRESSION: No evidence of pulmonary infection Electronically Signed   By: Genevive Bi  M.D.   On: 10/23/2016 09:00   [place] Lt Lower Extrem Ltd Soft Tissue Non Vascular  Result Date: 10/23/2016 CLINICAL DATA:  Left thigh edema. Question abscess. Morbid obesity. EXAM: ULTRASOUND LEFT LOWER EXTREMITY LIMITED TECHNIQUE: Ultrasound examination of the lower extremity soft tissues was performed in the area of clinical concern. COMPARISON:  None. FINDINGS: Throughout the left anterior and medial thigh, there is subcutaneous and deep intramuscular edema, without well-defined fluid collection to suggest abscess. IMPRESSION: No evidence of abscess. Edema throughout the anteromedial left thigh is likely related to cellulitis. Electronically Signed   By: Jeronimo Greaves M.D.   On: 10/23/2016 11:28    Scheduled Meds: . gabapentin  600 mg Oral TID  . heparin  7,500 Units Subcutaneous Q8H  . insulin aspart  0-20 Units Subcutaneous TID WC  . insulin aspart  0-5 Units Subcutaneous QHS  . insulin aspart  15 Units Subcutaneous TID WC  . insulin glargine  55 Units Subcutaneous QHS  . piperacillin-tazobactam (ZOSYN)  IV  3.375 g Intravenous Q8H  . sodium chloride  3,000 mL Intravenous Once  . vancomycin  1,500 mg Intravenous Q8H   Continuous Infusions: . sodium chloride 1,000 mL (10/24/16 1432)     LOS: 1 day   Talma Aguillard, MD FACP Hospitalist.   If 7PM-7AM, please contact night-coverage [email or web address] Password [facility] 10/24/2016, 3:03 PM

## 2016-10-25 LAB — GLUCOSE, CAPILLARY
GLUCOSE-CAPILLARY: 181 mg/dL — AB (ref 65–99)
Glucose-Capillary: 180 mg/dL — ABNORMAL HIGH (ref 65–99)
Glucose-Capillary: 158 mg/dL — ABNORMAL HIGH (ref 65–99)
Glucose-Capillary: 122 mg/dL — ABNORMAL HIGH (ref 65–99)

## 2016-10-25 NOTE — Progress Notes (Signed)
PROGRESS NOTE    Brittany [name]  ZOX:[id number] DOB: 03/20/1981 DOA: 10/23/2016 PCP: Louie Boston, MD     Brief Narrative: Dennie Fetters an 36 y.o.femalewith hx of morbid obesity, hx of anxiety and depression, lower extremity lymphadema, sleep apnea, not CPAP compliant, DM on insulin, hx of recurrent cellulitis, presented to the ER with increase redness and swelling of her left lower leg. She was found to have BS of 360, normal renal Fx tests, and no leukocytosis. She has a fever of 101.2, and resulting tachycardia. She was thought to be hypotensive, but proper BP measurements did not confirmed hypotension. She was started on IV Van/Zosyn, given IVF, and hospitalist was asked to admit her for cellulitis. Her lactic acid was slightly elevated. She is slightly better.  Hyperglycemia is being addressed with increasing insulin.    Assessment & Plan:   Principal Problem:   Cellulitis and abscess of leg Active Problems:   Sleep apnea   Diabetes mellitus without complication (HCC)   Obesity, Class III, BMI 40-49.9 (morbid obesity) (HCC)   Cellulitis   Pressure injury of skin  Cellulitis: [place] without any evidence of abscess. Will continue with IV Van/Zosyn. Area was demarcated for comparison. She is not septic.  It is better, but very slow in improvement.   DM: Will adjust insulin., SSI resistant, and meal coverages.   OSA: Non cpap wearer.  Obesity: Noted.   DVT prophylaxis:sub Q Heparin.  Code Status:full code.  Family Communication:husband at bedside.  Disposition Plan:home when appropriate.  Consults called:None.  Admission status:OBS.   Antimicrobials: Anti-infectives    Start     Dose/Rate Route Frequency Ordered Stop   10/23/16 1700  vancomycin (VANCOCIN) 1,500 mg in sodium chloride 0.9 % 500 mL IVPB     1,500 mg 250 mL/hr over 120 Minutes Intravenous Every 8 hours 10/23/16 1029     10/23/16 1600  piperacillin-tazobactam (ZOSYN) IVPB 3.375 g      3.375 g 12.5 mL/hr over 240 Minutes Intravenous Every 8 hours 10/23/16 1029     10/23/16 0830  piperacillin-tazobactam (ZOSYN) IVPB 3.375 g     3.375 g 100 mL/hr over 30 Minutes Intravenous  Once 10/23/16 0820 10/23/16 0915   10/23/16 0830  vancomycin (VANCOCIN) IVPB 1000 mg/200 mL premix  Status:  Discontinued     1,000 mg 200 mL/hr over 60 Minutes Intravenous  Once 10/23/16 0820 10/23/16 0822   10/23/16 0830  vancomycin (VANCOCIN) 2,500 mg in sodium chloride 0.9 % 500 mL IVPB     2,500 mg 250 mL/hr over 120 Minutes Intravenous  Once 10/23/16 0822 10/23/16 1110       Subjective:    I am fine.   Objective: Vitals:   10/24/16 2117 10/24/16 2332 10/25/16 0127 10/25/16 0626  BP: (!) 144/43   138/80  Pulse: (!) 119   (!) 114  Resp: (!) 22   16  Temp: (!) 102.3 F (39.1 C) (!) 101.8 F (38.8 C) 100.3 F (37.9 C) 99.5 F (37.5 C)  TempSrc: Oral Oral Oral Oral  SpO2: 100%   98%  Weight:      Height:        Intake/Output Summary (Last 24 hours) at 10/25/16 1511 Last data filed at 10/25/16 0930  Gross per 24 hour  Intake           3517.5 ml  Output             1800 ml  Net  1717.5 ml   Filed Weights   10/23/16 0745 10/23/16 1435  Weight: (!) 243.1 kg (536 lb) (!) 238.1 kg (525 lb)    Examination:  General exam: Appears calm and comfortable  Respiratory system: Clear to auscultation. Respiratory effort normal. Cardiovascular system: S1 & S2 heard, RRR. No JVD, murmurs, rubs, gallops or clicks. No pedal edema. Gastrointestinal system: Abdomen is nondistended, soft and nontender. No organomegaly or masses felt. Normal bowel sounds heard. Central nervous system: Alert and oriented. No focal neurological deficits. Extremities: Symmetric 5 x 5 power. Skin: Rash persisted.  Border slightly better. Redness over all a little better.  Psychiatry: Judgement and insight appear normal. Mood & affect appropriate.   Data Reviewed: I have personally reviewed following  labs and imaging studies  CBC:  Recent Labs Lab 10/23/16 0806 10/24/16 0435  WBC 10.5 11.1*  NEUTROABS 9.1*  --   HGB 14.5 12.4  HCT 43.3 38.1  MCV 85.6 86.2  PLT 200 183   Basic Metabolic Panel:  Recent Labs Lab 10/23/16 0806 10/24/16 0435  NA 135 135  K 3.8 3.0*  CL 99* 101  CO2 23 20*  GLUCOSE 360* 208*  BUN 13 15  CREATININE 0.93 1.17*  CALCIUM 9.6 8.1*   GFR: Estimated Creatinine Clearance: 143.1 mL/min (A) (by C-G formula based on SCr of 1.17 mg/dL (H)). Liver Function Tests:  Recent Labs Lab 10/23/16 0806 10/24/16 0435  AST 24 18  ALT 26 19  ALKPHOS 93 58  BILITOT 1.0 1.2  PROT 8.5* 6.5  ALBUMIN 3.8 2.8*   Coagulation Profile:  Recent Labs Lab 10/23/16 0806  INR 1.10   CBG:  Recent Labs Lab 10/24/16 1201 10/24/16 1640 10/24/16 2120 10/25/16 0747 10/25/16 1059  GLUCAP 228* 204* 163* 181* 180*   Sepsis Labs:  Recent Labs Lab 10/23/16 0806 10/23/16 1054  LATICACIDVEN 3.8* 2.8*    Recent Results (from the past 240 hour(s))  Culture, blood (Routine X 2) w Reflex to ID Panel     Status: None (Preliminary result)   Collection Time: 10/23/16  7:36 AM  Result Value Ref Range Status   Specimen Description BLOOD BLOOD RIGHT HAND BOTTLES DRAWN AEROBIC ONLY  Final   Special Requests   Final    Blood Culture results may not be optimal due to an inadequate volume of blood received in culture bottles   Culture NO GROWTH 2 DAYS  Final   Report Status PENDING  Incomplete  Culture, blood (Routine X 2) w Reflex to ID Panel     Status: None (Preliminary result)   Collection Time: 10/23/16  8:07 AM  Result Value Ref Range Status   Specimen Description   Final    BLOOD LEFT ANTECUBITAL BOTTLES DRAWN AEROBIC AND ANAEROBIC   Special Requests   Final    Blood Culture results may not be optimal due to an inadequate volume of blood received in culture bottles   Culture NO GROWTH 2 DAYS  Final   Report Status PENDING  Incomplete     Radiology  Studies: No results found.  Scheduled Meds: . gabapentin  600 mg Oral TID  . heparin  7,500 Units Subcutaneous Q8H  . insulin aspart  0-20 Units Subcutaneous TID WC  . insulin aspart  0-5 Units Subcutaneous QHS  . insulin aspart  15 Units Subcutaneous TID WC  . insulin glargine  55 Units Subcutaneous QHS   Continuous Infusions: . sodium chloride Stopped (10/25/16 1330)  . piperacillin-tazobactam (ZOSYN)  IV    .  sodium chloride    . vancomycin       LOS: 2 days   Elika Godar, MD [facility].   If 7PM-7AM, please contact night-coverage [email or web address] Password [email or web address] 10/25/2016, 3:11 PM

## 2016-10-25 NOTE — Care Management Important Message (Deleted)
Important Message  Patient Details  Name: Brittany [name] MRN: [id number] Date of Birth: 05-26-1981   Medicare Important Message Given:  Yes    Malcolm Metro, RN 10/25/2016, 2:37 PM

## 2016-10-25 NOTE — Care Management Note (Signed)
Case Management Note  Patient Details  Name: Brittany [name] MRN: [id number] Date of Birth: 01/23/1981  Subjective/Objective:                  Pt admitted with cellulitis. Chart reviewed for CM needs. She is from home, lives with family and is ind with ADL's. She has PCP, transportation and insurance with drug coverage. She has no HH or DME needs.   Action/Plan: Pt plans to return home with self care.   Expected Discharge Date:     10/26/2016             Expected Discharge Plan:  Home/Self Care  In-House Referral:  NA  Discharge planning Services  CM Consult  Post Acute Care Choice:  NA Choice offered to:  NA  Status of Service:  Completed, signed off  Malcolm Metro, RN 10/25/2016, 2:34 PM

## 2016-10-26 DIAGNOSIS — L02419 Cutaneous abscess of limb, unspecified: Secondary | ICD-10-CM

## 2016-10-26 DIAGNOSIS — L03119 Cellulitis of unspecified part of limb: Secondary | ICD-10-CM

## 2016-10-26 LAB — GLUCOSE, CAPILLARY
GLUCOSE-CAPILLARY: 115 mg/dL — AB (ref 65–99)
GLUCOSE-CAPILLARY: 171 mg/dL — AB (ref 65–99)
Glucose-Capillary: 116 mg/dL — ABNORMAL HIGH (ref 65–99)

## 2016-10-26 LAB — BASIC METABOLIC PANEL
ANION GAP: 9 (ref 5–15)
BUN: 13 mg/dL (ref 6–20)
CO2: 20 mmol/L — ABNORMAL LOW (ref 22–32)
Calcium: 7.8 mg/dL — ABNORMAL LOW (ref 8.9–10.3)
Chloride: 106 mmol/L (ref 101–111)
Creatinine, Ser: 1.22 mg/dL — ABNORMAL HIGH (ref 0.44–1.00)
GFR calc Af Amer: >60 mL/min (ref >60)
GFR, EST NON AFRICAN AMERICAN: 56 mL/min — AB (ref >60)
GLUCOSE: 130 mg/dL — AB (ref 65–99)
POTASSIUM: 3.1 mmol/L — AB (ref 3.5–5.1)
Sodium: 135 mmol/L (ref 135–145)

## 2016-10-26 LAB — VANCOMYCIN, TROUGH: VANCOMYCIN TR: 27 ug/mL — AB (ref 15–20)

## 2016-10-26 MED ORDER — DOXYCYCLINE HYCLATE 100 MG PO CAPS: 100.0000 mg | ORAL_CAPSULE | Freq: Two times a day (BID) | ORAL | 0 refills | Status: DC

## 2016-10-26 MED ORDER — INSULIN GLARGINE 100 UNIT/ML ~~LOC~~ SOLN: 55.0000 [IU] | Freq: Every day | SUBCUTANEOUS | 11 refills | Status: DC

## 2016-10-26 NOTE — Care Management Note (Addendum)
Case Management Note  Patient Details  Name: Brittany [name] MRN: [id number] Date of Birth: 18-Oct-1980  Expected Discharge Date:  10/26/16               Expected Discharge Plan:  Home/Self Care  In-House Referral:  NA  Discharge planning Services  CM Consult  Post Acute Care Choice:  NA Choice offered to:  NA  Status of Service:  Completed, signed off    Additional Comments: Pt discharging home today with self care. HH ordered and pt refuses stating she has tried [facility] in the past and she can do it herself. MD aware.   Malcolm Metro, RN 10/26/2016, 1:38 PM

## 2016-10-26 NOTE — Progress Notes (Signed)
Pts Potassium level 3.1, MD on call paged and made aware.

## 2016-10-26 NOTE — Progress Notes (Signed)
CRITICAL VALUE ALERT  Critical value received:  Vanc Trough 27  Date of notification:  10/26/2016  Time of notification:  1042  Critical value read back:Yes.    Nurse who received alert:  Pearlie Oyster RN  MD notified (1st page):  Ardyth Harps verbally notified   Time of first page:  1100 am  0900 dose of Vanc not given.

## 2016-10-26 NOTE — Progress Notes (Signed)
Pt IV removed, tolerated well.  Reviewed discharge instructions with pt and family at bedside.  Answered all questions at this time.   

## 2016-10-26 NOTE — Discharge Summary (Signed)
Physician Discharge Summary  Brittany [name] ZOX:[id number] DOB: [birth year] DOA: 10/23/2016  PCP: Brittany Boston, MD  Admit date: 10/23/2016 Discharge date: 10/26/2016  Time spent: 45 minutes  Recommendations for Outpatient Follow-up:  -Will be discharged home today. -Advised to follow up with PCP in 2 weeks.   Discharge Diagnoses:  Principal Problem:   Cellulitis and abscess of leg Active Problems:   Sleep apnea   Diabetes mellitus without complication (HCC)   Obesity, Class III, BMI 40-49.9 (morbid obesity) (HCC)   Cellulitis   Pressure injury of skin   Discharge Condition: Stable and improved  Filed Weights   10/23/16 0745 10/23/16 1435  Weight: (!) 243.1 kg (536 lb) (!) 238.1 kg (525 lb)    History of present illness:  As per Dr. Conley Rolls on 4/15: Brittany [name] is an 36 y.o. female with hx of morbid obesity, hx of anxiety and depression, lower extremity lymphadema, sleep apnea, not CPAP compliant, DM on insulin, hx of recurrent cellulitis, presented to the ER with increase redness and swelling of her left lower leg.  She was found to have BS of 360, normal renal Fx tests, and no leukocytosis.  She has a fever of 101.2, and resulting tachycardia.  She was thought to be hypotensive, but proper BP measurements did not confirmed hypotension.  She was started on IV Van/Zosyn, given IVF, and hospitalist was asked to admit her for cellulitis.  Her lactic acid was slightly elevated.    Hospital Course:   Left Leg Cellulitis -Appears improved. -No fever/leukocytosis. -Will DC vacn/zosyn and transition to PO doxy for 10 days. -HH RN will be arranged as well.  Morbid Obesity -Counseled on diet and exercise.  DM -Lantus dose has been increased. -Will need to closely monitored as an OP.  Procedures:  None   Consultations:  None  Discharge Instructions  Discharge Instructions    Diet - low sodium heart healthy    Complete by:  As directed    Increase activity  slowly    Complete by:  As directed      Allergies as of 10/26/2016      Reactions   Latex Itching, Swelling, Rash   Sulfa Antibiotics Nausea And Vomiting, Rash      Medication List    STOP taking these medications   Insulin Glargine 100 UNIT/ML Solostar Pen Commonly known as:  LANTUS SOLOSTAR Replaced by:  insulin glargine 100 UNIT/ML injection   naproxen 500 MG tablet Commonly known as:  NAPROSYN     TAKE these medications   doxycycline 100 MG capsule Commonly known as:  VIBRAMYCIN Take 1 capsule (100 mg total) by mouth 2 (two) times daily.   gabapentin 300 MG capsule Commonly known as:  NEURONTIN Take 2 capsules (600 mg total) by mouth 3 (three) times daily.   insulin aspart 100 UNIT/ML injection Commonly known as:  novoLOG SLIDING SCALE 3 TIMES DAILY WITH MEALS: For blood sugar 70-120-no insulin. Blood sugar 121-150 take 5 units. Blood sugar 151-208 take 8 units. Blood sugar 201-250 take 12 units. Blood sugar 251-300 take 17 units. Blood sugar 301-350 take 20 units. Blood sugar 351-400 take 25 units. Blood sugar greater than 400 take 28 units and call your doctor.   insulin glargine 100 UNIT/ML injection Commonly known as:  LANTUS Inject 0.55 mLs (55 Units total) into the skin at bedtime. Replaces:  Insulin Glargine 100 UNIT/ML Solostar Pen      Allergies  Allergen Reactions  . Latex Itching, Swelling  and Rash  . Sulfa Antibiotics Nausea And Vomiting and Rash   Follow-up Information    Brittany B, MD. Schedule an appointment as soon as possible for a visit in 2 week(s).   Specialty:  Family Medicine Contact information: [address] [place]aeanne G[place]s [place] [address] [phone]            The results of significant diagnostics from this hospitalization (including imaging, microbiology, ancillary and laboratory) are listed below for reference.    Significant Diagnostic Studies: Dg Chest 2 View  Result Date: 10/23/2016 CLINICAL DATA:  Could sepsis, LEFT  upper thigh cellulitis EXAM: CHEST  2 VIEW COMPARISON:  08/20/2016 FINDINGS: Normal mediastinum and cardiac silhouette. Normal pulmonary vasculature. No evidence of effusion, infiltrate, or pneumothorax. No acute bony abnormality. IMPRESSION: No evidence of pulmonary infection Electronically Signed   By: Brittany [name] M.D.   On: 10/23/2016 09:00   Dg Hand Complete Left  Result Date: 10/01/2016 CLINICAL DATA:  36 year old female with pain over the thenar region. EXAM: LEFT HAND - COMPLETE 3+ VIEW COMPARISON:  None. FINDINGS: There is no evidence of fracture or dislocation. There is no evidence of arthropathy or other focal bone abnormality. Soft tissues are unremarkable. IMPRESSION: Negative. Electronically Signed   By: Brittany [name] M.D.   On: 10/01/2016 01:05   [place] Lt Lower Extrem Ltd Soft Tissue Non Vascular  Result Date: 10/23/2016 CLINICAL DATA:  Left thigh edema. Question abscess. Morbid obesity. EXAM: ULTRASOUND LEFT LOWER EXTREMITY LIMITED TECHNIQUE: Ultrasound examination of the lower extremity soft tissues was performed in the area of clinical concern. COMPARISON:  None. FINDINGS: Throughout the left anterior and medial thigh, there is subcutaneous and deep intramuscular edema, without well-defined fluid collection to suggest abscess. IMPRESSION: No evidence of abscess. Edema throughout the anteromedial left thigh is likely related to cellulitis. Electronically Signed   By: Brittany [name] M.D.   On: 10/23/2016 11:28    Microbiology: Recent Results (from the past 240 hour(s))  Culture, blood (Routine X 2) w Reflex to ID Panel     Status: None (Preliminary result)   Collection Time: 10/23/16  7:36 AM  Result Value Ref Range Status   Specimen Description BLOOD BLOOD RIGHT HAND BOTTLES DRAWN AEROBIC ONLY  Final   Special Requests   Final    Blood Culture results may not be optimal due to an inadequate volume of blood received in culture bottles   Culture NO GROWTH 3 DAYS  Final   Report  Status PENDING  Incomplete  Culture, blood (Routine X 2) w Reflex to ID Panel     Status: None (Preliminary result)   Collection Time: 10/23/16  8:07 AM  Result Value Ref Range Status   Specimen Description   Final    BLOOD LEFT ANTECUBITAL BOTTLES DRAWN AEROBIC AND ANAEROBIC   Special Requests   Final    Blood Culture results may not be optimal due to an inadequate volume of blood received in culture bottles   Culture NO GROWTH 3 DAYS  Final   Report Status PENDING  Incomplete     Labs: Basic Metabolic Panel:  Recent Labs Lab 10/23/16 0806 10/24/16 0435 10/26/16 0453  NA 135 135 135  K 3.8 3.0* 3.1*  CL 99* 101 106  CO2 23 20* 20*  GLUCOSE 360* 208* 130*  BUN CREATININE 0.93 1.17* 1.22*  CALCIUM 9.6 8.1* 7.8*   Liver Function Tests:  Recent Labs Lab 10/23/16 0806 10/24/16 0435  AST 24 18  ALT 26 19  ALKPHOS 93 58  BILITOT 1.0 1.2  PROT 8.5* 6.5  ALBUMIN 3.8 2.8*   No results for input(s): LIPASE, AMYLASE in the last 168 hours. No results for input(s): AMMONIA in the last 168 hours. CBC:  Recent Labs Lab 10/23/16 0806 10/24/16 0435  WBC 10.5 11.1*  NEUTROABS 9.1*  --   HGB 14.5 12.4  HCT 43.3 38.1  MCV 85.6 86.2  PLT 200 183   Cardiac Enzymes: No results for input(s): CKTOTAL, CKMB, CKMBINDEX, TROPONINI in the last 168 hours. BNP: BNP (last 3 results) No results for input(s): BNP in the last 8760 hours.  ProBNP (last 3 results) No results for input(s): PROBNP in the last 8760 hours.  CBG:  Recent Labs Lab 10/25/16 0747 10/25/16 1059 10/25/16 1725 10/25/16 2059 10/26/16 0745  GLUCAP 181* 180* 158* 122* 115*       Signed:  HERNANDEZ ACOSTA,ESTELA  [facility] Pager: [phone] 10/26/2016, 12:15 PM

## 2016-10-27 ENCOUNTER — Encounter: Payer: Medicaid Other | Admitting: Diagnostic Neuroimaging

## 2016-10-28 LAB — CULTURE, BLOOD (ROUTINE X 2)
CULTURE: NO GROWTH
Culture: NO GROWTH

## 2016-11-22 ENCOUNTER — Other Ambulatory Visit: Payer: Self-pay | Admitting: Neurology

## 2016-11-22 ENCOUNTER — Telehealth: Payer: Self-pay | Admitting: Neurology

## 2016-11-22 DIAGNOSIS — G5600 Carpal tunnel syndrome, unspecified upper limb: Secondary | ICD-10-CM

## 2016-11-22 NOTE — Telephone Encounter (Signed)
Attempted to reach patient to inform her per Dr Lucia GaskinsAhern, she has mild left hand carpel tunnel. Dr Lucia GaskinsAhern will refer her to a hand surgeon. The phone was not answered, and voice mailbox not set up. Only one number to call.  If patient or husband calls back, phone staff may relay message.

## 2016-11-22 NOTE — Telephone Encounter (Signed)
Pt's husband returned the call. Advised of the msg, he understood. Advised outgoing referral coordinator will call with the referral information.

## 2016-11-22 NOTE — Telephone Encounter (Signed)
Pt's husband called said his wife has been expecting a call to review NCV/EMG for surgery or PT. Please call the pt at [phone]

## 2016-11-24 NOTE — Telephone Encounter (Signed)
Called and spoke to the patient relayed her PCP will have to refer to Hand Surgery Because of her insurance.  I have called Patient's PCP and she is aware.

## 2016-12-14 ENCOUNTER — Other Ambulatory Visit: Payer: Self-pay | Admitting: Orthopedic Surgery

## 2016-12-14 ENCOUNTER — Ambulatory Visit
Admission: RE | Admit: 2016-12-14 | Discharge: 2016-12-14 | Disposition: A | Discharge status: Home / Self Care | Payer: Medicaid Other | Source: Ambulatory Visit | Attending: Orthopedic Surgery | Admitting: Orthopedic Surgery

## 2016-12-14 DIAGNOSIS — R200 Anesthesia of skin: Secondary | ICD-10-CM

## 2016-12-27 ENCOUNTER — Telehealth: Payer: Self-pay

## 2016-12-27 NOTE — Telephone Encounter (Signed)
[facility] Dr. Merrilee SeashoreKuzma's plan: "Explained to her that the area of numbness and tingling which occurs w/ a CTS. She is advised that numbness and tingling on the dorsal part of her hand is not coming from her median nerve at her wrist. I would recommend x-rays of her cervical spine which have not been done. Her notes are reviewed. She is advised to use 2 Aleve twice a day w/ meals and precautions. I will see her back in following the x-rays which will have to be outsource." Sent to med records for scanning, copy to Dr. Lucia GaskinsAhern for review.

## 2017-01-06 ENCOUNTER — Emergency Department (HOSPITAL_COMMUNITY)
Admission: EM | Admit: 2017-01-06 | Discharge: 2017-01-07 | Disposition: A | Discharge status: Home / Self Care | Payer: Medicaid Other | Attending: Emergency Medicine | Admitting: Emergency Medicine

## 2017-01-06 ENCOUNTER — Emergency Department (HOSPITAL_COMMUNITY): Payer: Medicaid Other

## 2017-01-06 ENCOUNTER — Encounter (HOSPITAL_COMMUNITY): Payer: Self-pay | Admitting: Emergency Medicine

## 2017-01-06 DIAGNOSIS — E119 Type 2 diabetes mellitus without complications: Secondary | ICD-10-CM | POA: Diagnosis not present

## 2017-01-06 DIAGNOSIS — R1011 Right upper quadrant pain: Secondary | ICD-10-CM

## 2017-01-06 DIAGNOSIS — Z794 Long term (current) use of insulin: Secondary | ICD-10-CM | POA: Insufficient documentation

## 2017-01-06 DIAGNOSIS — Z91040 Latex allergy status: Secondary | ICD-10-CM | POA: Diagnosis not present

## 2017-01-06 DIAGNOSIS — R109 Unspecified abdominal pain: Secondary | ICD-10-CM

## 2017-01-06 DIAGNOSIS — K8020 Calculus of gallbladder without cholecystitis without obstruction: Secondary | ICD-10-CM

## 2017-01-06 LAB — URINALYSIS, ROUTINE W REFLEX MICROSCOPIC
BILIRUBIN URINE: NEGATIVE
KETONES UR: NEGATIVE mg/dL
Leukocytes, UA: NEGATIVE
NITRITE: NEGATIVE
PH: 5 (ref 5.0–8.0)
PROTEIN: NEGATIVE mg/dL
Specific Gravity, Urine: 1.041 — ABNORMAL HIGH (ref 1.005–1.030)

## 2017-01-06 LAB — COMPREHENSIVE METABOLIC PANEL
ALBUMIN: 3.4 g/dL — AB (ref 3.5–5.0)
ALT: 25 U/L (ref 14–54)
AST: 22 U/L (ref 15–41)
Alkaline Phosphatase: 79 U/L (ref 38–126)
Anion gap: 7 (ref 5–15)
BILIRUBIN TOTAL: 0.6 mg/dL (ref 0.3–1.2)
BUN: 12 mg/dL (ref 6–20)
CALCIUM: 8.8 mg/dL — AB (ref 8.9–10.3)
CO2: 25 mmol/L (ref 22–32)
Chloride: 102 mmol/L (ref 101–111)
Creatinine, Ser: 0.76 mg/dL (ref 0.44–1.00)
GFR calc non Af Amer: >60 mL/min (ref >60)
GLUCOSE: 377 mg/dL — AB (ref 65–99)
POTASSIUM: 4.1 mmol/L (ref 3.5–5.1)
SODIUM: 134 mmol/L — AB (ref 135–145)
TOTAL PROTEIN: 7.6 g/dL (ref 6.5–8.1)

## 2017-01-06 LAB — CBC WITH DIFFERENTIAL/PLATELET
BASOS PCT: 0 %
Basophils Absolute: 0 10*3/uL (ref 0.0–0.1)
EOS ABS: 0.1 10*3/uL (ref 0.0–0.7)
Eosinophils Relative: 2 %
HCT: 40.4 % (ref 36.0–46.0)
Hemoglobin: 13.2 g/dL (ref 12.0–15.0)
Lymphocytes Relative: 34 %
Lymphs Abs: 2.4 10*3/uL (ref 0.7–4.0)
MCH: 27.6 pg (ref 26.0–34.0)
MCHC: 32.7 g/dL (ref 30.0–36.0)
MCV: 84.5 fL (ref 78.0–100.0)
MONO ABS: 0.3 10*3/uL (ref 0.1–1.0)
MONOS PCT: 5 %
Neutro Abs: 4.1 10*3/uL (ref 1.7–7.7)
Neutrophils Relative %: 59 %
Platelets: 204 10*3/uL (ref 150–400)
RBC: 4.78 MIL/uL (ref 3.87–5.11)
RDW: 14.7 % (ref 11.5–15.5)
WBC: 6.9 10*3/uL (ref 4.0–10.5)

## 2017-01-06 LAB — PREGNANCY, URINE: Preg Test, Ur: NEGATIVE

## 2017-01-06 MED ORDER — TRAMADOL HCL 50 MG PO TABS: 50.0000 mg | ORAL_TABLET | Freq: Four times a day (QID) | ORAL | 0 refills | Status: DC | PRN

## 2017-01-06 MED ORDER — HYDROMORPHONE HCL 1 MG/ML IJ SOLN
1.0000 mg | Freq: Once | INTRAMUSCULAR | Status: AC
  Administered 2017-01-06: 1 mg via INTRAVENOUS
  Filled 2017-01-06: qty 1

## 2017-01-06 MED ORDER — ONDANSETRON HCL 4 MG/2ML IJ SOLN
4.0000 mg | Freq: Once | INTRAMUSCULAR | Status: AC
  Administered 2017-01-06: 4 mg via INTRAVENOUS
  Filled 2017-01-06: qty 2

## 2017-01-06 MED ORDER — SODIUM CHLORIDE 0.9 % IV BOLUS (SEPSIS)
1000.0000 mL | Freq: Once | INTRAVENOUS | Status: AC
  Administered 2017-01-06: 1000 mL via INTRAVENOUS

## 2017-01-06 NOTE — ED Triage Notes (Signed)
PT c/o right sided flank pain x1 week but denies any urinary symptoms. PT denies any recent injuries to back.

## 2017-01-06 NOTE — ED Provider Notes (Signed)
[facility] Provider Note   CSN: [id number] Arrival date & time: 01/06/17  1823     History   Chief Complaint Chief Complaint  Patient presents with  . Flank Pain    HPI Brittany [name] is a 36 y.o. female.  Patient complains of right flank pain for a week now no fever no nausea no vomiting. Sometimes the pain radiates to her suprapubic area   The history is provided by the patient.  Flank Pain  This is a new problem. The current episode started more than 1 week ago. The problem occurs constantly. The problem has not changed since onset.Pertinent negatives include no chest pain, no abdominal pain and no headaches. Nothing aggravates the symptoms. Nothing relieves the symptoms. She has tried nothing for the symptoms. The treatment provided no relief.    Past Medical History:  Diagnosis Date  . Anxiety   . Carpal tunnel syndrome on left 10/12/2016  . Cellulitis and abscess of leg   . Depression   . Diabetes mellitus without complication (HCC)   . Lymphedema   . Sleep apnea    DX with sleep apnea - unable to use c-pap  . Umbilical hernia     Patient Active Problem List   Diagnosis Date Noted  . Pressure injury of skin 10/24/2016  . Cellulitis and abscess of leg 10/23/2016  . Sleep apnea 10/23/2016  . Diabetes mellitus without complication (HCC) 10/23/2016  . Obesity, Class III, BMI 40-49.9 (morbid obesity) (HCC) 10/23/2016  . Cellulitis 10/23/2016  . Carpal tunnel syndrome on left 10/12/2016  . Candidal intertrigo 12/31/2014  . Morbid obesity (HCC) 12/29/2014  . Hyponatremia 12/29/2014  . Cellulitis of left lower extremity 12/29/2014  . DM (diabetes mellitus), type 2, uncontrolled (HCC) 01/04/2013    Past Surgical History:  Procedure Laterality Date  . CESAREAN SECTION  11/09/01  . INSERTION OF MESH N/A 01/02/2013   Procedure: INSERTION OF MESH;  Surgeon: Lodema Pilot, DO;  Location: [facility];  Service: General;  Laterality: N/A;  . UMBILICAL HERNIA REPAIR  N/A 01/02/2013   Procedure: LAPAROSCOPIC UMBILICAL HERNIA;  Surgeon: Lodema Pilot, DO;  Location: [facility];  Service: General;  Laterality: N/A;    OB History    No data available       Home Medications    Prior to Admission medications   Medication Sig Start Date End Date Taking? Authorizing Provider  ibuprofen (ADVIL,MOTRIN) 200 MG tablet Take 800 mg by mouth every 6 (six) hours as needed for mild pain or moderate pain.   Yes [provider]  insulin aspart (NOVOLOG) 100 UNIT/ML injection SLIDING SCALE 3 TIMES DAILY WITH MEALS: For blood sugar 70-120-no insulin. Blood sugar 121-150 take 5 units. Blood sugar 151-208 take 8 units. Blood sugar 201-250 take 12 units. Blood sugar 251-300 take 17 units. Blood sugar 301-350 take 20 units. Blood sugar 351-400 take 25 units. Blood sugar greater than 400 take 28 units and call your doctor. 03/27/16  Yes Devoria Albe, MD  insulin glargine (LANTUS) 100 UNIT/ML injection Inject 0.55 mLs (55 Units total) into the skin at bedtime. 10/26/16  Yes Philip Aspen, Limmie Patricia, MD  doxycycline (VIBRAMYCIN) 100 MG capsule Take 1 capsule (100 mg total) by mouth 2 (two) times daily. Patient not taking: Reported on 01/06/2017 10/26/16   Philip Aspen, Limmie Patricia, MD  gabapentin (NEURONTIN) 300 MG capsule Take 2 capsules (600 mg total) by mouth 3 (three) times daily. Patient not taking: Reported on 01/06/2017 10/06/16   Lucia Gaskins,  Griselda MinerAntonia B, MD  traMADol (ULTRAM) 50 MG tablet Take 1 tablet (50 mg total) by mouth every 6 (six) hours as needed. 01/06/17   Bethann BerkshireZammit, Melik Blancett, MD    Family History Family History  Problem Relation Age of Onset  . Cancer Mother        leukemia  . Diabetes Mother   . Hypertension Mother   . Hyperlipidemia Mother   . Other Father        Sciatica    Social History Social History  Substance Use Topics  . Smoking status: Never Smoker  . Smokeless tobacco: Never Used  . Alcohol use No     Allergies   Latex and Sulfa  antibiotics   Review of Systems Review of Systems  Constitutional: Negative for appetite change and fatigue.  HENT: Negative for congestion, ear discharge and sinus pressure.   Eyes: Negative for discharge.  Respiratory: Negative for cough.   Cardiovascular: Negative for chest pain.  Gastrointestinal: Negative for abdominal pain and diarrhea.  Genitourinary: Positive for flank pain. Negative for frequency and hematuria.  Musculoskeletal: Negative for back pain.  Skin: Negative for rash.  Neurological: Negative for seizures and headaches.  Psychiatric/Behavioral: Negative for hallucinations.     Physical Exam Updated Vital Signs BP (!) 154/104 (BP Location: Right Arm)   Pulse 94   Temp 98.4 F (36.9 C) (Oral)   Resp 18   Ht 5\' 11"  (1.803 m)   Wt (!) 238.1 kg (525 lb)   SpO2 98%   BMI 73.22 kg/m   Physical Exam  Constitutional: She is oriented to person, place, and time. She appears well-developed.  HENT:  Head: Normocephalic.  Eyes: Conjunctivae and EOM are normal. No scleral icterus.  Neck: Neck supple. No thyromegaly present.  Cardiovascular: Normal rate and regular rhythm.  Exam reveals no gallop and no friction rub.   No murmur heard. Pulmonary/Chest: No stridor. She has no wheezes. She has no rales. She exhibits no tenderness.  Abdominal: She exhibits no distension. There is no tenderness. There is no rebound.  Genitourinary:  Genitourinary Comments: Mild right flank tenderness  Musculoskeletal: Normal range of motion. She exhibits no edema.  Lymphadenopathy:    She has no cervical adenopathy.  Neurological: She is oriented to person, place, and time. She exhibits normal muscle tone. Coordination normal.  Skin: No rash noted. No erythema.  Psychiatric: She has a normal mood and affect. Her behavior is normal.     ED Treatments / Results  Labs (all labs ordered are listed, but only abnormal results are displayed) Labs Reviewed  URINALYSIS, ROUTINE W REFLEX  MICROSCOPIC - Abnormal; Notable for the following:       Result Value   APPearance HAZY (*)    Specific Gravity, Urine 1.041 (*)    Glucose, UA >=500 (*)    Hgb urine dipstick LARGE (*)    Bacteria, UA RARE (*)    Squamous Epithelial / LPF 6-30 (*)    All other components within normal limits  URINE CULTURE  PREGNANCY, URINE  CBC WITH DIFFERENTIAL/PLATELET  COMPREHENSIVE METABOLIC PANEL    EKG  EKG Interpretation None       Radiology No results found.  Procedures Procedures (including critical care time)  Medications Ordered in ED Medications  sodium chloride 0.9 % bolus 1,000 mL (1,000 mLs Intravenous New Bag/Given 01/06/17 2145)  HYDROmorphone (DILAUDID) injection 1 mg (1 mg Intravenous Given 01/06/17 2146)  ondansetron (ZOFRAN) injection 4 mg (4 mg Intravenous Given 01/06/17 2146)  Initial Impression / Assessment and Plan / ED Course  I have reviewed the triage vital signs and the nursing notes.  Pertinent labs & imaging results that were available during my care of the patient were reviewed by me and considered in my medical decision making (see chart for details).     Patient with right flank pain. Labs and x-ray pending  Final Clinical Impressions(s) / ED Diagnoses   Final diagnoses:  Flank pain    New Prescriptions New Prescriptions   TRAMADOL (ULTRAM) 50 MG TABLET    Take 1 tablet (50 mg total) by mouth every 6 (six) hours as needed.     Bethann Berkshire, MD 01/06/17 2201

## 2017-01-06 NOTE — Discharge Instructions (Addendum)
Follow up with your md next week.    You are scheduled for an ultrasound of your gallbladder and kidneys tomorrow morning. PLEASE CALL [phone] in the morning to have a time scheduled for you.   Please return without fail for worsening symptoms, including fever, intractable vomiting, escalating pain or any other symptoms concerning to you.

## 2017-01-07 NOTE — ED Provider Notes (Signed)
.   Please see previous physicians note regarding patient's presenting history and physical, initial ED course, and associated medical decision making.   In short this is a 36 year old female with history of morbid obesity and diabetes who presents with one week of right-sided flank pain. Pain radiates into the right upper quadrant. Primarily has been constant, but thinks he may have been exacerbated also by eating. Blood work overall reassuring with hyperglycemia, but she states is a chronic issue. An x-ray of the abdomen shows possible gallstone. My evaluation she has a soft and nonsurgical abdomen. Does have right upper quadrant tenderness to palpation. No fevers, leukocytosis, or signs of infection. It may be biliary colic that she is feeling. UA without signs of infection or blood chest. No RLQ tenderness on exam to suggest appendicitis. Given her weight, she is unable to fit in the CT scanner. Previous provider told her to go to [place] for worsening symptoms for potential CT. She is stable. I have ordered her RUQ US and renal US to return in the morning for evaluation of biliary disease or urolithiasis. Strict return and follow-up instructions reviewed. She expressed understanding of all discharge instructions and felt comfortable with the plan of care.    Lavera GuiseLiu, Dana Duo, MD 01/07/17 [phone]

## 2017-01-07 NOTE — ED Notes (Signed)
Pt states understanding of care given and follow up instructions.  Pt a/o, ambulated from bed to wheelchair and taken from ER by [facility]

## 2017-01-08 LAB — URINE CULTURE

## 2017-01-09 ENCOUNTER — Telehealth: Payer: Self-pay | Admitting: Emergency Medicine

## 2017-01-09 NOTE — Telephone Encounter (Signed)
Post ED Visit - Positive Culture Follow-up  Culture report reviewed by antimicrobial stewardship pharmacist:  []  Enzo BiNathan Batchelder, Pharm.D. []  Celedonio MiyamotoJeremy Frens, [address] BCPS AQ-ID [x]  Garvin FilaMike Maccia, Pharm.D., BCPS []  Georgina PillionElizabeth Martin, [address]Ph[address] BCPS []  CommodoreMinh Pham, [address]Ph[address]D., BCPS, AAHIVP []  Estella HuskMichelle Turner, Pharm.D., BCPS, AAHIVP []  Lysle Pearlachel Rumbarger, PharmD, BCPS []  Casilda Carlsaylor Stone, PharmD, BCPS []  Pollyann SamplesAndy Johnston, PharmD, BCPS  Positive urine culture Treated with none, asymptomatic,  no further patient follow-up is required at this time.  Berle MullMiller, Roby Spalla 01/09/2017, 9:58 AM

## 2017-01-09 NOTE — Progress Notes (Signed)
ED Antimicrobial Stewardship Positive Culture Follow Up   Drenda FreezeJennifer L Bulthuis is an 36 y.o. female who [facility] with a chief complaint of  Chief Complaint  Patient presents with  . Flank Pain    Recent Results (from the past 720 hour(s))  Urine Culture     Status: Abnormal   Collection Time: 01/06/17  9:21 PM  Result Value Ref Range Status   Specimen Description URINE, CLEAN CATCH  Final   Special Requests NONE  Final   Culture (A)  Final    >=100,000 COLONIES/mL LACTOBACILLUS SPECIES Standardized susceptibility testing for this organism is not available. Performed at [facility], [address] [place], [place] [address]    Report Status 01/08/2017 FINAL  Final   No abx indicated  ED Provider: Donivan Scullobyn Wojeck, PA-C  Bertram MillardMichael A Belton Peplinski 01/09/2017, 9:22 AM Infectious Diseases Pharmacist Phone# [phone]

## 2017-01-10 ENCOUNTER — Ambulatory Visit (HOSPITAL_COMMUNITY)
Admission: RE | Admit: 2017-01-10 | Discharge: 2017-01-10 | Disposition: A | Discharge status: Home / Self Care | Payer: Medicaid Other | Source: Ambulatory Visit | Attending: Emergency Medicine | Admitting: Emergency Medicine

## 2017-01-10 DIAGNOSIS — R109 Unspecified abdominal pain: Secondary | ICD-10-CM

## 2017-01-10 DIAGNOSIS — R1011 Right upper quadrant pain: Secondary | ICD-10-CM | POA: Diagnosis present

## 2017-01-10 DIAGNOSIS — K8020 Calculus of gallbladder without cholecystitis without obstruction: Secondary | ICD-10-CM | POA: Insufficient documentation

## 2017-01-13 ENCOUNTER — Encounter (HOSPITAL_COMMUNITY): Payer: Self-pay | Admitting: Emergency Medicine

## 2017-01-13 ENCOUNTER — Emergency Department (HOSPITAL_COMMUNITY)
Admission: EM | Admit: 2017-01-13 | Discharge: 2017-01-13 | Disposition: A | Discharge status: Home / Self Care | Payer: Medicaid Other | Attending: Emergency Medicine | Admitting: Emergency Medicine

## 2017-01-13 DIAGNOSIS — R1011 Right upper quadrant pain: Secondary | ICD-10-CM | POA: Diagnosis present

## 2017-01-13 DIAGNOSIS — M545 Low back pain, unspecified: Secondary | ICD-10-CM

## 2017-01-13 DIAGNOSIS — R110 Nausea: Secondary | ICD-10-CM | POA: Diagnosis not present

## 2017-01-13 DIAGNOSIS — Z794 Long term (current) use of insulin: Secondary | ICD-10-CM | POA: Insufficient documentation

## 2017-01-13 DIAGNOSIS — Z91040 Latex allergy status: Secondary | ICD-10-CM | POA: Insufficient documentation

## 2017-01-13 DIAGNOSIS — E119 Type 2 diabetes mellitus without complications: Secondary | ICD-10-CM | POA: Diagnosis not present

## 2017-01-13 DIAGNOSIS — Z79899 Other long term (current) drug therapy: Secondary | ICD-10-CM | POA: Diagnosis not present

## 2017-01-13 LAB — URINALYSIS, ROUTINE W REFLEX MICROSCOPIC
Bilirubin Urine: NEGATIVE
Glucose, UA: >=500 mg/dL — AB
Ketones, ur: NEGATIVE mg/dL
Leukocytes, UA: NEGATIVE
Nitrite: NEGATIVE
Protein, ur: NEGATIVE mg/dL
Specific Gravity, Urine: 1.039 — ABNORMAL HIGH (ref 1.005–1.030)
pH: 5 (ref 5.0–8.0)

## 2017-01-13 LAB — COMPREHENSIVE METABOLIC PANEL
ALT: 28 U/L (ref 14–54)
AST: 27 U/L (ref 15–41)
Albumin: 3.4 g/dL — ABNORMAL LOW (ref 3.5–5.0)
Alkaline Phosphatase: 92 U/L (ref 38–126)
Anion gap: 10 (ref 5–15)
BUN: 12 mg/dL (ref 6–20)
CO2: 22 mmol/L (ref 22–32)
Calcium: 9 mg/dL (ref 8.9–10.3)
Chloride: 104 mmol/L (ref 101–111)
Creatinine, Ser: 0.79 mg/dL (ref 0.44–1.00)
GFR calc Af Amer: >60 mL/min (ref >60)
GFR calc non Af Amer: >60 mL/min (ref >60)
Glucose, Bld: 385 mg/dL — ABNORMAL HIGH (ref 65–99)
Potassium: 3.9 mmol/L (ref 3.5–5.1)
Sodium: 136 mmol/L (ref 135–145)
Total Bilirubin: 0.6 mg/dL (ref 0.3–1.2)
Total Protein: 7.5 g/dL (ref 6.5–8.1)

## 2017-01-13 LAB — CBC WITH DIFFERENTIAL/PLATELET
Basophils Absolute: 0 10*3/uL (ref 0.0–0.1)
Basophils Relative: 0 %
Eosinophils Absolute: 0.2 10*3/uL (ref 0.0–0.7)
Eosinophils Relative: 2 %
HCT: 40.6 % (ref 36.0–46.0)
Hemoglobin: 13.5 g/dL (ref 12.0–15.0)
Lymphocytes Relative: 30 %
Lymphs Abs: 2.2 10*3/uL (ref 0.7–4.0)
MCH: 27.7 pg (ref 26.0–34.0)
MCHC: 33.3 g/dL (ref 30.0–36.0)
MCV: 83.4 fL (ref 78.0–100.0)
Monocytes Absolute: 0.3 10*3/uL (ref 0.1–1.0)
Monocytes Relative: 4 %
Neutro Abs: 4.8 10*3/uL (ref 1.7–7.7)
Neutrophils Relative %: 64 %
Platelets: 196 10*3/uL (ref 150–400)
RBC: 4.87 MIL/uL (ref 3.87–5.11)
RDW: 14.4 % (ref 11.5–15.5)
WBC: 7.4 10*3/uL (ref 4.0–10.5)

## 2017-01-13 LAB — LIPASE, BLOOD: Lipase: 29 U/L (ref 11–51)

## 2017-01-13 LAB — I-STAT BETA HCG BLOOD, ED (MC, WL, AP ONLY): I-stat hCG, quantitative: <5 m[IU]/mL (ref <5)

## 2017-01-13 MED ORDER — ONDANSETRON HCL 4 MG/2ML IJ SOLN
4.0000 mg | Freq: Once | INTRAMUSCULAR | Status: AC
  Administered 2017-01-13: 4 mg via INTRAVENOUS
  Filled 2017-01-13: qty 2

## 2017-01-13 MED ORDER — OXYCODONE-ACETAMINOPHEN 5-325 MG PO TABS
2.0000 | ORAL_TABLET | Freq: Once | ORAL | Status: AC
  Administered 2017-01-13: 2 via ORAL
  Filled 2017-01-13: qty 2

## 2017-01-13 MED ORDER — MORPHINE SULFATE (PF) 4 MG/ML IV SOLN
4.0000 mg | Freq: Once | INTRAVENOUS | Status: DC
  Filled 2017-01-13: qty 1

## 2017-01-13 NOTE — Discharge Instructions (Signed)
Apply ice or heat to the affected areas for comfort. Avoid foods that aggravate your symptoms. Follow-up with your general surgeon in 4 days as scheduled. Return to the ED immediately if any concerning signs or symptoms develop such as fever, chills, severe pain, persistent vomiting, or blood in urine or stool.

## 2017-01-13 NOTE — ED Triage Notes (Signed)
Pt c/o right flank pain and vomiting that started this evening.

## 2017-01-13 NOTE — ED Notes (Signed)
Pt states she is unable to take morphine it makes her "freak out".

## 2017-01-13 NOTE — ED Notes (Signed)
Pt alert & oriented x4. Patient given discharge instructions, paperwork & prescription(s). Patient instructed to stop at the registration desk to finish any additional paperwork. Patient verbalized understanding. Pt left department w/ no further questions. 

## 2017-01-13 NOTE — ED Notes (Signed)
Pt ambulated to bathroom with assistance from this tech and from family member.

## 2017-01-13 NOTE — ED Notes (Signed)
Pt states pain improved. States it is a 5 out of 10 and is now manageable. RN Idolina Primeronnie Haymore notified

## 2017-01-13 NOTE — ED Provider Notes (Signed)
[facility] Provider Note   CSN: [id number] Arrival date & time: 01/13/17  2002     History   Chief Complaint Chief Complaint  Patient presents with  . Abdominal Pain    HPI Brittany [name] is a 36 y.o. femalewith history of anxiety, depression, DM,Umbilical hernia, morbid obesity who presents today with chief complaint acute onset, constant right upper quadrant pain with associated back pain, nausea, and vomiting which began 10 minutes PTA. She states that she was seen and evaluated for flank pain last week, returned to obtain right upper quadrant ultrasound 4 days later which showed multiple gallstones that was not definitive for cholecystitis as well as increased hepatic echogenicity suggesting fatty infiltration or hepatocellular disease. She notes that since then she experiences mild 20 is of pain in the right upper quadrant and back after meals. She notes she had thing crest pizza 1.5 hours prior to symptom onset earlier today. Pain is constant and localized to the right upper quadrant and right lumbar region. She describes the pain as a severe burning sensation. She endorses nausea and multiple episodes of NBNB emesis, however this is normal for her and she states "I could have a small meal or large meal and still vomited after it ". She endorses increased pain with palpation, laying back, deep inspiration. She endorses shortness of breath due to pain. She denies fevers, chills, chest pain, diarrhea, melena, hematochezia, dysuria, hematuria. She has not tried anything for her symptoms.She denies any trauma or falls. The history is provided by the patient.    Past Medical History:  Diagnosis Date  . Anxiety   . Carpal tunnel syndrome on left 10/12/2016  . Cellulitis and abscess of leg   . Depression   . Diabetes mellitus without complication (HCC)   . Lymphedema   . Sleep apnea    DX with sleep apnea - unable to use c-pap  . Umbilical hernia     Patient Active Problem  List   Diagnosis Date Noted  . Pressure injury of skin 10/24/2016  . Cellulitis and abscess of leg 10/23/2016  . Sleep apnea 10/23/2016  . Diabetes mellitus without complication (HCC) 10/23/2016  . Obesity, Class III, BMI 40-49.9 (morbid obesity) (HCC) 10/23/2016  . Cellulitis 10/23/2016  . Carpal tunnel syndrome on left 10/12/2016  . Candidal intertrigo 12/31/2014  . Morbid obesity (HCC) 12/29/2014  . Hyponatremia 12/29/2014  . Cellulitis of left lower extremity 12/29/2014  . DM (diabetes mellitus), type 2, uncontrolled (HCC) 01/04/2013    Past Surgical History:  Procedure Laterality Date  . CESAREAN SECTION  11/09/01  . INSERTION OF MESH N/A 01/02/2013   Procedure: INSERTION OF MESH;  Surgeon: Lodema Pilot, DO;  Location: [facility];  Service: General;  Laterality: N/A;  . UMBILICAL HERNIA REPAIR N/A 01/02/2013   Procedure: LAPAROSCOPIC UMBILICAL HERNIA;  Surgeon: Lodema Pilot, DO;  Location: [facility];  Service: General;  Laterality: N/A;    OB History    No data available       Home Medications    Prior to Admission medications   Medication Sig Start Date End Date Taking? Authorizing Provider  acetaminophen (TYLENOL) 500 MG tablet Take 500 mg by mouth every 6 (six) hours as needed for mild pain or moderate pain.   Yes [provider]  ibuprofen (ADVIL,MOTRIN) 200 MG tablet Take 400 mg by mouth every 6 (six) hours as needed for mild pain or moderate pain.    Yes [provider]  insulin aspart (NOVOLOG)  100 UNIT/ML injection SLIDING SCALE 3 TIMES DAILY WITH MEALS: For blood sugar 70-120-no insulin. Blood sugar 121-150 take 5 units. Blood sugar 151-208 take 8 units. Blood sugar 201-250 take 12 units. Blood sugar 251-300 take 17 units. Blood sugar 301-350 take 20 units. Blood sugar 351-400 take 25 units. Blood sugar greater than 400 take 28 units and call your doctor. 03/27/16  Yes Devoria Albe, MD  insulin glargine (LANTUS) 100 UNIT/ML injection Inject 0.55 mLs (55 Units  total) into the skin at bedtime. 10/26/16  Yes Philip Aspen, Limmie Patricia, MD  doxycycline (VIBRAMYCIN) 100 MG capsule Take 1 capsule (100 mg total) by mouth 2 (two) times daily. Patient not taking: Reported on 01/06/2017 10/26/16   Philip Aspen, Limmie Patricia, MD  gabapentin (NEURONTIN) 300 MG capsule Take 2 capsules (600 mg total) by mouth 3 (three) times daily. Patient not taking: Reported on 01/06/2017 10/06/16   Anson Fret, MD  traMADol (ULTRAM) 50 MG tablet Take 1 tablet (50 mg total) by mouth every 6 (six) hours as needed. Patient not taking: Reported on 01/13/2017 01/06/17   Bethann Berkshire, MD    Family History Family History  Problem Relation Age of Onset  . Cancer Mother        leukemia  . Diabetes Mother   . Hypertension Mother   . Hyperlipidemia Mother   . Other Father        Sciatica    Social History Social History  Substance Use Topics  . Smoking status: Never Smoker  . Smokeless tobacco: Never Used  . Alcohol use No     Allergies   Latex and Sulfa antibiotics   Review of Systems Review of Systems  Constitutional: Negative for chills and fever.  Respiratory: Positive for shortness of breath.   Cardiovascular: Negative for chest pain.  Gastrointestinal: Positive for abdominal pain, nausea and vomiting. Negative for blood in stool and diarrhea.  Genitourinary: Negative for dysuria, hematuria and urgency.  Musculoskeletal: Positive for back pain.     Physical Exam Updated Vital Signs BP 135/82 (BP Location: Left Wrist)   Pulse 87   Temp 97.7 F (36.5 C) (Oral)   Resp 17   Ht 5\' 11"  (1.803 m)   Wt (!) 242.2 kg (534 lb)   SpO2 96%   BMI 74.48 kg/m   Physical Exam  Constitutional: She appears well-developed and well-nourished. No distress.  HENT:  Head: Normocephalic and atraumatic.  Eyes: Conjunctivae are normal. Right eye exhibits no discharge. Left eye exhibits no discharge.  Neck: Normal range of motion. Neck supple. No JVD present. No tracheal  deviation present.  Cardiovascular: Normal rate, regular rhythm and normal heart sounds.   Pulmonary/Chest: Effort normal and breath sounds normal. She exhibits no tenderness.  Abdominal: Soft. She exhibits no distension. There is tenderness. There is no rebound and no guarding.  Abdomen is obese, soft, bowel sounds hypoactive. LUQ ttp, but maximal TTP in the RUQ. No epigastric tenderness. Murphy's sign absent, Rovsing sign absent, no tenderness to palpation at McBurney's point. No CVA tenderness  Musculoskeletal: She exhibits no edema.  No midline spine TTP. There is right sided lumbar paraspinal muscle tenderness. No deformity, crepitus, or step-off noted.  Neurological: She is alert.  Skin: No erythema.  Psychiatric: She has a normal mood and affect. Her behavior is normal.  Nursing note and vitals reviewed.    ED Treatments / Results  Labs (all labs ordered are listed, but only abnormal results are displayed) Labs Reviewed  COMPREHENSIVE METABOLIC  PANEL - Abnormal; Notable for the following:       Result Value   Glucose, Bld 385 (*)    Albumin 3.4 (*)    All other components within normal limits  URINALYSIS, ROUTINE W REFLEX MICROSCOPIC - Abnormal; Notable for the following:    APPearance HAZY (*)    Specific Gravity, Urine 1.039 (*)    Glucose, UA >=500 (*)    Hgb urine dipstick SMALL (*)    Bacteria, UA RARE (*)    Squamous Epithelial / LPF 0-5 (*)    All other components within normal limits  CBC WITH DIFFERENTIAL/PLATELET  LIPASE, BLOOD  I-STAT BETA HCG BLOOD, ED ([facility], [facility], [facility] ONLY)    EKG  EKG Interpretation None       Radiology No results found.  Procedures Procedures (including critical care time)  Medications Ordered in ED Medications  morphine 4 MG/ML injection 4 mg (4 mg Intravenous Refused 01/13/17 2059)  ondansetron (ZOFRAN) injection 4 mg (4 mg Intravenous Given 01/13/17 2054)  oxyCODONE-acetaminophen (PERCOCET/ROXICET) 5-325 MG per tablet 2 tablet (2  tablets Oral Given 01/13/17 2128)     Initial Impression / Assessment and Plan / ED Course  I have reviewed the triage vital signs and the nursing notes.  Pertinent labs & imaging results that were available during my care of the patient were reviewed by me and considered in my medical decision making (see chart for details).     Patient with known cholelithiasis presents with acute onset of right upper quadrant pain and right-sided low back pain 1.5 hours after eating pizza. Afebrile. Initially hypertensive, with improvement after administration of pain medication. I suspect this is likely in response to pain. Initially tachypneic, with resolution, I suspect this is also in response to pain. No leukocytosis, no left or right abnormality, no elevated LFTs. UA not concerning for UTI, and patient has no urinary symptoms. Doubt CES, nephrolithiasis, pyelonephritis, ectopic pregnancy, TOA, ovarian torsion PID or appendicitis. Pain managed successfully from 8/10 to 3/10 with Percocet and on reevaluation, abdominal tenderness is markedly decreased. Zofran helpful for nausea. No further emergent workup required. She has follow up with general surgery in 4 days for reevaluation of her cholelithiasis. No evidence of cholecystitis, ascending cholangitis, or hepatitis. Suspect her pain is related to her cholelithiasis and exacerbated by her diet. She is stable for discharge home with follow-up with general surgery as scheduled for further evaluation. Discussed indications for return to the ED immediately. Pt verbalized understanding of and agreement with plan and is safe for discharge home at this time.  Final Clinical Impressions(s) / ED Diagnoses   Final diagnoses:  Right upper quadrant abdominal pain  Acute right-sided low back pain without sciatica  Nausea    New Prescriptions Discharge Medication List as of 01/13/2017 10:36 PM       Jeanie SewerFawze, Daziyah Cogan A, PA-C 01/14/17 0044    Eber HongMiller, Brian, MD 01/14/17  [phone]

## 2017-01-17 ENCOUNTER — Encounter: Payer: Self-pay | Admitting: General Surgery

## 2017-01-17 ENCOUNTER — Ambulatory Visit (INDEPENDENT_AMBULATORY_CARE_PROVIDER_SITE_OTHER): Payer: Medicaid Other | Admitting: General Surgery

## 2017-01-17 VITALS — BP 181/121 | HR 119 | Temp 97.8°F | Resp 18 | Ht 71.0 in | Wt >= 6400 oz

## 2017-01-17 DIAGNOSIS — K8020 Calculus of gallbladder without cholecystitis without obstruction: Secondary | ICD-10-CM | POA: Diagnosis not present

## 2017-01-17 NOTE — Patient Instructions (Signed)

## 2017-01-17 NOTE — Progress Notes (Signed)
Brittany [name]; [id number]; 1980-08-10   HPI   Patient is a 36 year old white female who was referred here by the emergency room for evaluation and treatment of cholelithiasis.  This was found on workup for right back pain.  She states she did have some indigestion, but she denies any specific right upper quadrant abdominal pain.  She denies any fatty food intolerance.  She does have multiple aches and pains that are migratory throughout abdomen.  She primarily is concerned about pain along the left side of breath.  She denies any fever, chills, jaundice.  Her appetite is variable.  She is super morbidly obese, weighing 534 pounds.  She currently has no specific right upper quadrant abdominal pain.  She does have 8/10 pain various ailments.  She is wheelchair bound. Past Medical History:  Diagnosis Date  . Anxiety   . Carpal tunnel syndrome on left 10/12/2016  . Cellulitis and abscess of leg   . Depression   . Diabetes mellitus without complication (HCC)   . Lymphedema   . Sleep apnea    DX with sleep apnea - unable to use c-pap  . Umbilical hernia     Past Surgical History:  Procedure Laterality Date  . CESAREAN SECTION  11/09/01  . INSERTION OF MESH N/A 01/02/2013   Procedure: INSERTION OF MESH;  Surgeon: Lodema Pilot, DO;  Location: [facility];  Service: General;  Laterality: N/A;  . UMBILICAL HERNIA REPAIR N/A 01/02/2013   Procedure: LAPAROSCOPIC UMBILICAL HERNIA;  Surgeon: Lodema Pilot, DO;  Location: [facility];  Service: General;  Laterality: N/A;    Family History  Problem Relation Age of Onset  . Cancer Mother        leukemia  . Diabetes Mother   . Hypertension Mother   . Hyperlipidemia Mother   . Other Father        Sciatica    Current Outpatient Prescriptions on File Prior to Visit  Medication Sig Dispense Refill  . acetaminophen (TYLENOL) 500 MG tablet Take 500 mg by mouth every 6 (six) hours as needed for mild pain or moderate pain.    Marland Kitchen doxycycline (VIBRAMYCIN) 100 MG capsule  Take 1 capsule (100 mg total) by mouth 2 (two) times daily. (Patient not taking: Reported on 01/06/2017) 20 capsule 0  . gabapentin (NEURONTIN) 300 MG capsule Take 2 capsules (600 mg total) by mouth 3 (three) times daily. (Patient not taking: Reported on 01/06/2017) 180 capsule 4  . ibuprofen (ADVIL,MOTRIN) 200 MG tablet Take 400 mg by mouth every 6 (six) hours as needed for mild pain or moderate pain.     Marland Kitchen insulin aspart (NOVOLOG) 100 UNIT/ML injection SLIDING SCALE 3 TIMES DAILY WITH MEALS: For blood sugar 70-120-no insulin. Blood sugar 121-150 take 5 units. Blood sugar 151-208 take 8 units. Blood sugar 201-250 take 12 units. Blood sugar 251-300 take 17 units. Blood sugar 301-350 take 20 units. Blood sugar 351-400 take 25 units. Blood sugar greater than 400 take 28 units and call your doctor. 10 mL 0  . insulin glargine (LANTUS) 100 UNIT/ML injection Inject 0.55 mLs (55 Units total) into the skin at bedtime. 10 mL 11  . traMADol (ULTRAM) 50 MG tablet Take 1 tablet (50 mg total) by mouth every 6 (six) hours as needed. (Patient not taking: Reported on 01/13/2017) 30 tablet 0   No current facility-administered medications on file prior to visit.     Allergies  Allergen Reactions  . Latex Itching, Swelling and Rash  . Sulfa Antibiotics  Nausea And Vomiting and Rash    History  Alcohol Use No    History  Smoking Status  . Never Smoker  Smokeless Tobacco  . Never Used    Review of Systems  HENT: Negative.   Eyes: Negative.   Respiratory: Negative.   Cardiovascular: Negative.   Gastrointestinal: Positive for heartburn and nausea.  Genitourinary: Positive for frequency.  Musculoskeletal: Positive for back pain.  Skin: Negative.   Neurological: Negative.   Endo/Heme/Allergies: Negative.   Psychiatric/Behavioral: Negative.     Objective   Vitals:   01/17/17 1512  BP: (!) 181/121  Pulse: (!) 119  Resp: 18  Temp: 97.8 F (36.6 C)    Physical Exam  Constitutional: She is  oriented to person, place, and time.  Morbidly obese white female sitting in a wheelchair, in no acute distress  HENT:  Head: Normocephalic and atraumatic.  Eyes: No scleral icterus.  Neck: Normal range of motion. Neck supple.  Cardiovascular: Normal rate, regular rhythm and normal heart sounds.   No murmur heard. Pulmonary/Chest: Effort normal and breath sounds normal. She has no wheezes. She has no rales.  Abdominal: Soft. She exhibits no distension. There is no tenderness. There is no rebound.  Neurological: She is alert and oriented to person, place, and time.  Skin: Skin is warm and dry.  Vitals reviewed.   Ultrasound report reviewed  liver enzyme tests all within normal limits.  No white blood cell count noted at ER visit.  Assessment     Cholelithiasis, currently asymptomatic  Plan    I told the patient that she does not need cholecystectomy at this time.  Her symptoms are somewhat atypical for gallbladder disease.  She is at high risk for surgical intervention due to her morbid obesity and previously placed laparoscopic abdominal mesh.  I told her that given this, [facility] would have [facility] and [facility] as this is outside the expertise of what can be handled at [facility]. She understands this.  She states she had surgery by [facility] Biagio [facility]Layton [facility] and would like to go back to them.  Literature given.  Follow up here prn.

## 2017-01-26 ENCOUNTER — Encounter (HOSPITAL_COMMUNITY): Payer: Self-pay | Admitting: Emergency Medicine

## 2017-01-26 ENCOUNTER — Emergency Department (HOSPITAL_COMMUNITY)
Admission: EM | Admit: 2017-01-26 | Discharge: 2017-01-27 | Disposition: A | Discharge status: Home / Self Care | Payer: Medicaid Other | Attending: Emergency Medicine | Admitting: Emergency Medicine

## 2017-01-26 ENCOUNTER — Emergency Department (HOSPITAL_COMMUNITY): Payer: Medicaid Other

## 2017-01-26 DIAGNOSIS — Z91040 Latex allergy status: Secondary | ICD-10-CM | POA: Insufficient documentation

## 2017-01-26 DIAGNOSIS — E119 Type 2 diabetes mellitus without complications: Secondary | ICD-10-CM | POA: Insufficient documentation

## 2017-01-26 DIAGNOSIS — Z794 Long term (current) use of insulin: Secondary | ICD-10-CM | POA: Diagnosis not present

## 2017-01-26 DIAGNOSIS — R911 Solitary pulmonary nodule: Secondary | ICD-10-CM | POA: Diagnosis not present

## 2017-01-26 DIAGNOSIS — Z79899 Other long term (current) drug therapy: Secondary | ICD-10-CM | POA: Diagnosis not present

## 2017-01-26 DIAGNOSIS — R109 Unspecified abdominal pain: Secondary | ICD-10-CM | POA: Diagnosis present

## 2017-01-26 LAB — URINALYSIS, ROUTINE W REFLEX MICROSCOPIC
Bacteria, UA: NONE SEEN
Bilirubin Urine: NEGATIVE
Glucose, UA: >=500 mg/dL — AB
Hgb urine dipstick: NEGATIVE
Ketones, ur: NEGATIVE mg/dL
Leukocytes, UA: NEGATIVE
Nitrite: NEGATIVE
Protein, ur: 100 mg/dL — AB
RBC / HPF: NONE SEEN RBC/hpf (ref 0–5)
Specific Gravity, Urine: 1.021 (ref 1.005–1.030)
pH: 5 (ref 5.0–8.0)

## 2017-01-26 LAB — COMPREHENSIVE METABOLIC PANEL
ALT: 28 U/L (ref 14–54)
AST: 24 U/L (ref 15–41)
Albumin: 3.6 g/dL (ref 3.5–5.0)
Alkaline Phosphatase: 85 U/L (ref 38–126)
Anion gap: 8 (ref 5–15)
BUN: 9 mg/dL (ref 6–20)
CO2: 21 mmol/L — ABNORMAL LOW (ref 22–32)
Calcium: 9.2 mg/dL (ref 8.9–10.3)
Chloride: 104 mmol/L (ref 101–111)
Creatinine, Ser: 0.67 mg/dL (ref 0.44–1.00)
GFR calc Af Amer: >60 mL/min (ref >60)
GFR calc non Af Amer: >60 mL/min (ref >60)
Glucose, Bld: 304 mg/dL — ABNORMAL HIGH (ref 65–99)
Potassium: 4 mmol/L (ref 3.5–5.1)
Sodium: 133 mmol/L — ABNORMAL LOW (ref 135–145)
Total Bilirubin: 0.6 mg/dL (ref 0.3–1.2)
Total Protein: 7.3 g/dL (ref 6.5–8.1)

## 2017-01-26 LAB — CBC
HCT: 40.5 % (ref 36.0–46.0)
Hemoglobin: 13.5 g/dL (ref 12.0–15.0)
MCH: 27.7 pg (ref 26.0–34.0)
MCHC: 33.3 g/dL (ref 30.0–36.0)
MCV: 83.2 fL (ref 78.0–100.0)
Platelets: 216 10*3/uL (ref 150–400)
RBC: 4.87 MIL/uL (ref 3.87–5.11)
RDW: 14.2 % (ref 11.5–15.5)
WBC: 6.8 10*3/uL (ref 4.0–10.5)

## 2017-01-26 LAB — PREGNANCY, URINE: PREG TEST UR: NEGATIVE

## 2017-01-26 LAB — CBG MONITORING, ED: GLUCOSE-CAPILLARY: 308 mg/dL — AB (ref 65–99)

## 2017-01-26 LAB — LIPASE, BLOOD: Lipase: 25 U/L (ref 11–51)

## 2017-01-26 MED ORDER — ONDANSETRON HCL 4 MG/2ML IJ SOLN
4.0000 mg | Freq: Once | INTRAMUSCULAR | Status: AC
  Administered 2017-01-26: 4 mg via INTRAVENOUS
  Filled 2017-01-26: qty 2

## 2017-01-26 MED ORDER — SODIUM CHLORIDE 0.9 % IV BOLUS (SEPSIS)
500.0000 mL | Freq: Once | INTRAVENOUS | Status: AC
  Administered 2017-01-26: 500 mL via INTRAVENOUS

## 2017-01-26 MED ORDER — IOPAMIDOL (ISOVUE-300) INJECTION 61%
INTRAVENOUS | Status: AC
  Administered 2017-01-26: 100 mL
  Filled 2017-01-26: qty 100

## 2017-01-26 MED ORDER — HYDROMORPHONE HCL 1 MG/ML IJ SOLN
1.0000 mg | Freq: Once | INTRAMUSCULAR | Status: AC
  Administered 2017-01-26: 1 mg via INTRAVENOUS
  Filled 2017-01-26: qty 1

## 2017-01-26 NOTE — ED Notes (Signed)
Patient transported to CT 

## 2017-01-26 NOTE — ED Triage Notes (Signed)
Pt presents to ED for assessment generalized abdominal pain worsening since a month ago, states the pain has been severe, she was diagnosed with gallstones, no surgery at this time.  Pt states pain is no diffuse to entire abdomen, burning, and feels like warmth is "leaking" down to the area of her uterus.  C/o of nausea and vomiting, especially with eating.

## 2017-01-26 NOTE — ED Provider Notes (Signed)
[facility]-EMERGENCY DEPT Provider Note   CSN: [id number] Arrival date & time: 01/26/17  1932     History   Chief Complaint Chief Complaint  Patient presents with  . Abdominal Pain    HPI Brittany [name] is a 36 y.o. female.  The history is provided by the patient and medical records.  Abdominal Pain   Associated symptoms include nausea and vomiting.    36 year old female with history of anxiety, cellulitis, carpal tunnel, depression, diabetes, lymphedema, sleep apnea, presenting to the ED for abdominal pain. Patient states she has been seen in the ED a few times over the past month. States initially she was told it was gallstones. She did [place] in [place], however was told her gallbladder was not inflamed but they had some concerns about her abdominal mesh that was inserted 5 years ago for hernia repair. States she was scheduled for outpatient CT, but returned here today due to worsening pain. States initially she had pain on her right upper abdomen and right flank, but now pain is moved to the left side as well. States it is pain deep inside but that her abdomen feels "hot".  She denies any fever or chills. States she has had some nausea and vomiting.  She denies any diarrhea. No pelvic complaints or vaginal discharge.  Past Medical History:  Diagnosis Date  . Anxiety   . Carpal tunnel syndrome on left 10/12/2016  . Cellulitis and abscess of leg   . Depression   . Diabetes mellitus without complication (HCC)   . Lymphedema   . Sleep apnea    DX with sleep apnea - unable to use c-pap  . Umbilical hernia     Patient Active Problem List   Diagnosis Date Noted  . Pressure injury of skin 10/24/2016  . Cellulitis and abscess of leg 10/23/2016  . Sleep apnea 10/23/2016  . Diabetes mellitus without complication (HCC) 10/23/2016  . Obesity, Class III, BMI 40-49.9 (morbid obesity) (HCC) 10/23/2016  . Cellulitis 10/23/2016  . Carpal tunnel syndrome on left  10/12/2016  . Candidal intertrigo 12/31/2014  . Morbid obesity (HCC) 12/29/2014  . Hyponatremia 12/29/2014  . Cellulitis of left lower extremity 12/29/2014  . DM (diabetes mellitus), type 2, uncontrolled (HCC) 01/04/2013    Past Surgical History:  Procedure Laterality Date  . CESAREAN SECTION  11/09/01  . INSERTION OF MESH N/A 01/02/2013   Procedure: INSERTION OF MESH;  Surgeon: Lodema PilotBrian Layton, DO;  Location: [facility];  Service: General;  Laterality: N/A;  . UMBILICAL HERNIA REPAIR N/A 01/02/2013   Procedure: LAPAROSCOPIC UMBILICAL HERNIA;  Surgeon: Lodema PilotBrian Layton, DO;  Location: [facility];  Service: General;  Laterality: N/A;    OB History    No data available       Home Medications    Prior to Admission medications   Medication Sig Start Date End Date Taking? Authorizing Provider  acetaminophen (TYLENOL) 500 MG tablet Take 500 mg by mouth every 6 (six) hours as needed for mild pain or moderate pain.    [provider]  doxycycline (VIBRAMYCIN) 100 MG capsule Take 1 capsule (100 mg total) by mouth 2 (two) times daily. Patient not taking: Reported on 01/06/2017 10/26/16   Philip AspenHernandez Acosta, Limmie PatriciaEstela Y, MD  gabapentin (NEURONTIN) 300 MG capsule Take 2 capsules (600 mg total) by mouth 3 (three) times daily. Patient not taking: Reported on 01/06/2017 10/06/16   Anson FretAhern, Antonia B, MD  ibuprofen (ADVIL,MOTRIN) 200 MG tablet Take 400 mg by mouth  every 6 (six) hours as needed for mild pain or moderate pain.     [provider]  insulin aspart (NOVOLOG) 100 UNIT/ML injection SLIDING SCALE 3 TIMES DAILY WITH MEALS: For blood sugar 70-120-no insulin. Blood sugar 121-150 take 5 units. Blood sugar 151-208 take 8 units. Blood sugar 201-250 take 12 units. Blood sugar 251-300 take 17 units. Blood sugar 301-350 take 20 units. Blood sugar 351-400 take 25 units. Blood sugar greater than 400 take 28 units and call your doctor. 03/27/16   Devoria Albe, MD  insulin glargine (LANTUS) 100 UNIT/ML injection  Inject 0.55 mLs (55 Units total) into the skin at bedtime. 10/26/16   Philip Aspen, Limmie Patricia, MD  traMADol (ULTRAM) 50 MG tablet Take 1 tablet (50 mg total) by mouth every 6 (six) hours as needed. Patient not taking: Reported on 01/13/2017 01/06/17   Bethann Berkshire, MD    Family History Family History  Problem Relation Age of Onset  . Cancer Mother        leukemia  . Diabetes Mother   . Hypertension Mother   . Hyperlipidemia Mother   . Other Father        Sciatica    Social History Social History  Substance Use Topics  . Smoking status: Never Smoker  . Smokeless tobacco: Never Used  . Alcohol use No     Allergies   Morphine and related; Latex; and Sulfa antibiotics   Review of Systems Review of Systems  Gastrointestinal: Positive for abdominal pain, nausea and vomiting.  All other systems reviewed and are negative.    Physical Exam Updated Vital Signs BP (!) 127/91 (BP Location: Right Wrist)   Pulse 88   Temp 97.8 F (36.6 C) (Oral)   Resp (!) 22   SpO2 100%   Physical Exam  Constitutional: She is oriented to person, place, and time. She appears well-developed and well-nourished.  Morbidly obese  HENT:  Head: Normocephalic and atraumatic.  Mouth/Throat: Oropharynx is clear and moist.  Eyes: Pupils are equal, round, and reactive to light. Conjunctivae and EOM are normal.  Neck: Normal range of motion.  Cardiovascular: Normal rate, regular rhythm and normal heart sounds.   Pulmonary/Chest: Effort normal and breath sounds normal. No respiratory distress. She has no wheezes.  Abdominal: Soft. Bowel sounds are normal. There is tenderness in the right upper quadrant, left upper quadrant and left lower quadrant. There is no rebound.  Exam limited due to body habitus, apparent tenderness throughout abdomen, but worse RUQ, LUQ, and LLQ  Musculoskeletal: Normal range of motion.  Neurological: She is alert and oriented to person, place, and time.  Skin: Skin is warm  and dry.  Psychiatric: She has a normal mood and affect.  Nursing note and vitals reviewed.    ED Treatments / Results  Labs (all labs ordered are listed, but only abnormal results are displayed) Labs Reviewed  COMPREHENSIVE METABOLIC PANEL - Abnormal; Notable for the following:       Result Value   Sodium 133 (*)    CO2 21 (*)    Glucose, Bld 304 (*)    All other components within normal limits  URINALYSIS, ROUTINE W REFLEX MICROSCOPIC - Abnormal; Notable for the following:    APPearance HAZY (*)    Glucose, UA >=500 (*)    Protein, ur 100 (*)    Squamous Epithelial / LPF 6-30 (*)    All other components within normal limits  CBG MONITORING, ED - Abnormal; Notable for the following:  Glucose-Capillary 308 (*)    All other components within normal limits  LIPASE, BLOOD  CBC  PREGNANCY, URINE    EKG  EKG Interpretation None       Radiology Ct Abdomen Pelvis W Contrast  Result Date: 01/26/2017 CLINICAL DATA:  Initial evaluation for acute generalized abdominal pain with nausea and vomiting for 1 month. EXAM: CT ABDOMEN AND PELVIS WITH CONTRAST TECHNIQUE: Multidetector CT imaging of the abdomen and pelvis was performed using the standard protocol following bolus administration of intravenous contrast. CONTRAST:  ISOVUE-300 IOPAMIDOL (ISOVUE-300) INJECTION 61% COMPARISON:  None available. FINDINGS: Lower chest: Single 5 mm nodule present at the posterior right lower lobe (series 4, image 2), indeterminate. Visualized lung bases are otherwise clear. Hepatobiliary: Liver demonstrates a normal contrast enhanced appearance. Large calcified stone present within the gallbladder lumen. No CT evidence for acute cholecystitis. No biliary dilatation. Pancreas: Mild fatty infiltration of the pancreas noted. Pancreas otherwise unremarkable without acute inflammation or mass. Spleen: Spleen enlarged measuring approximately 20 cm in craniocaudad dimension. Spleen otherwise unremarkable.  Adrenals/Urinary Tract: Adrenal glands are normal. Kidneys equal in size with symmetric enhancement. No nephrolithiasis, hydronephrosis, or focal enhancing renal mass. No hydroureter. Bladder within normal limits. Stomach/Bowel: Stomach within normal limits. No evidence for bowel obstruction. Appendix within normal limits. No acute inflammatory changes seen about the bowels. Vascular/Lymphatic: Normal intravascular enhancement seen throughout the intra-abdominal aorta and its branch vessels. No pathologically enlarged intra-abdominal lymph nodes. There are a few mildly enlarged left external iliac nodes measuring up to 13 mm (series 3, image 95). Mildly prominent 12 mm left pelvic sidewall node noted (series 3, image 93). Mildly prominent right external iliac nodes measure up to 13 mm (series 3, image 37). Findings are of uncertain significance. Reproductive: IUD in place within the uterus. Uterus and ovaries otherwise within normal limits. Other: No free air or fluid. Fat containing ventral hernia present within the periumbilical region without associated inflammation. Musculoskeletal: No acute osseous abnormality. No worrisome lytic or blastic osseous lesions. IMPRESSION: 1. No CT evidence for acute intra-abdominal or pelvic process. 2. Cholelithiasis. 3. Splenomegaly. 4. Mildly enlarged bilateral external iliac and left pelvic sidewall adenopathy is above, of uncertain etiology or significance, but may be reactive. 5. IUD in place within the uterus. 6. Fat containing paraumbilical hernia without associated inflammation. 7. 5 mm right lower lobe pulmonary nodule, indeterminate. No follow-up needed if patient is low-risk. Non-contrast chest CT can be considered in 12 months if patient is high-risk. This recommendation follows the consensus statement: Guidelines for Management of Incidental Pulmonary Nodules Detected on CT Images: From the Fleischner Society 2017; Radiology 2017; 284:228-243. Electronically Signed    By: Rise Mu M.D.   On: 01/26/2017 23:08    Procedures Procedures (including critical care time)  Medications Ordered in ED Medications - No data to display   Initial Impression / Assessment and Plan / ED Course  I have reviewed the triage vital signs and the nursing notes.  Pertinent labs & imaging results that were available during my care of the patient were reviewed by me and considered in my medical decision making (see chart for details).  36 year old female here with abdominal pain. Has been having issues over the past month and a half. Has been seen a few times in the ED. Initially told she had gallstones and sent to general surgery, but they did not feel her gallbladder was inflamed. Was to have outpatient CT, but worsening pain so came here. She is afebrile and  nontoxic. Exam is somewhat limited due to patient's body habitus. She appears to have tenderness in the right upper quadrant, left upper quadrant, and left lower quadrant. There is no rebound or guarding. Bowel sounds are normal. Screening lab work overall reassuring. UA without signs of infection. We'll plan for CT scan.  CT with noted gallstones as well as some pelvic lymphadenopathy that is nonspecific. Patient does not have any current pelvic complaints or vaginal discharge. She also has incidental finding of lung nodule which she was made aware of. At this time, unknown etiology of patient's pain. She has been taking a lot of ibuprofen because of her pain, may have a component of gastritis. I recommended that she follow-up with GI. May wish to start PPI.  Can follow-up for PCP about incidental findings on CT.  Discussed plan with patient, she acknowledged understanding and agreed with plan of care.  Return precautions given for new or worsening symptoms.  Final Clinical Impressions(s) / ED Diagnoses   Final diagnoses:  Abdominal pain, unspecified abdominal location  Lung nodule seen on imaging study    New  Prescriptions New Prescriptions   No medications on file     Garlon Hatchet, PA-C 01/27/17 0047    Raeford Razor, MD 02/04/17 1221

## 2017-01-27 NOTE — ED Notes (Signed)
Patient noted tightness and swelling around site.  This RN noted IV not flowing and infiltration of NaCl.  Pt provided with heat pack.

## 2017-01-27 NOTE — Discharge Instructions (Signed)
As we discussed your CT did not show any findings to explain your pain.  You did have some enlarged lymph nodes in the pelvis as well as a lung nodule-- you will need this followed up with your primary care doctor. May wish to try a PPI like prilosec to see if this helps while taking ibuprofen. Follow-up with your primary care doctor.  May also wish to follow-up with GI. Return to the ED for new or worsening symptoms.

## 2017-01-27 NOTE — ED Notes (Signed)
Patient able to ambulate independently, but typically wheelchair bound at most times.

## 2017-01-29 ENCOUNTER — Emergency Department (HOSPITAL_COMMUNITY)
Admission: EM | Admit: 2017-01-29 | Discharge: 2017-01-29 | Disposition: A | Discharge status: Home / Self Care | Payer: Medicaid Other | Attending: Emergency Medicine | Admitting: Emergency Medicine

## 2017-01-29 ENCOUNTER — Encounter (HOSPITAL_COMMUNITY): Payer: Self-pay | Admitting: *Deleted

## 2017-01-29 DIAGNOSIS — E119 Type 2 diabetes mellitus without complications: Secondary | ICD-10-CM | POA: Insufficient documentation

## 2017-01-29 DIAGNOSIS — Z794 Long term (current) use of insulin: Secondary | ICD-10-CM | POA: Insufficient documentation

## 2017-01-29 DIAGNOSIS — Z91040 Latex allergy status: Secondary | ICD-10-CM | POA: Diagnosis not present

## 2017-01-29 DIAGNOSIS — R112 Nausea with vomiting, unspecified: Secondary | ICD-10-CM | POA: Insufficient documentation

## 2017-01-29 DIAGNOSIS — R197 Diarrhea, unspecified: Secondary | ICD-10-CM | POA: Diagnosis not present

## 2017-01-29 DIAGNOSIS — R1084 Generalized abdominal pain: Secondary | ICD-10-CM | POA: Insufficient documentation

## 2017-01-29 LAB — URINALYSIS, ROUTINE W REFLEX MICROSCOPIC
BACTERIA UA: NONE SEEN
Bilirubin Urine: NEGATIVE
Glucose, UA: 150 mg/dL — AB
Ketones, ur: NEGATIVE mg/dL
Leukocytes, UA: NEGATIVE
Nitrite: NEGATIVE
PH: 5 (ref 5.0–8.0)
Protein, ur: NEGATIVE mg/dL
SPECIFIC GRAVITY, URINE: 1.02 (ref 1.005–1.030)

## 2017-01-29 LAB — COMPREHENSIVE METABOLIC PANEL
ALBUMIN: 4 g/dL (ref 3.5–5.0)
ALT: 30 U/L (ref 14–54)
AST: 26 U/L (ref 15–41)
Alkaline Phosphatase: 93 U/L (ref 38–126)
Anion gap: 11 (ref 5–15)
BILIRUBIN TOTAL: 0.9 mg/dL (ref 0.3–1.2)
BUN: 9 mg/dL (ref 6–20)
CHLORIDE: 102 mmol/L (ref 101–111)
CO2: 24 mmol/L (ref 22–32)
CREATININE: 0.72 mg/dL (ref 0.44–1.00)
Calcium: 9.6 mg/dL (ref 8.9–10.3)
GFR calc Af Amer: >60 mL/min (ref >60)
GLUCOSE: 280 mg/dL — AB (ref 65–99)
POTASSIUM: 3.9 mmol/L (ref 3.5–5.1)
Sodium: 137 mmol/L (ref 135–145)
Total Protein: 8.4 g/dL — ABNORMAL HIGH (ref 6.5–8.1)

## 2017-01-29 LAB — CBC
HEMATOCRIT: 44.3 % (ref 36.0–46.0)
Hemoglobin: 14.9 g/dL (ref 12.0–15.0)
MCH: 28.4 pg (ref 26.0–34.0)
MCHC: 33.6 g/dL (ref 30.0–36.0)
MCV: 84.4 fL (ref 78.0–100.0)
PLATELETS: 199 10*3/uL (ref 150–400)
RBC: 5.25 MIL/uL — ABNORMAL HIGH (ref 3.87–5.11)
RDW: 14.2 % (ref 11.5–15.5)
WBC: 8 10*3/uL (ref 4.0–10.5)

## 2017-01-29 LAB — LIPASE, BLOOD: Lipase: 23 U/L (ref 11–51)

## 2017-01-29 MED ORDER — SODIUM CHLORIDE 0.9 % IV BOLUS (SEPSIS)
1000.0000 mL | Freq: Once | INTRAVENOUS | Status: AC
  Administered 2017-01-29: 1000 mL via INTRAVENOUS

## 2017-01-29 MED ORDER — ONDANSETRON HCL 4 MG PO TABS: 4.0000 mg | ORAL_TABLET | Freq: Three times a day (TID) | ORAL | 0 refills | Status: DC | PRN

## 2017-01-29 MED ORDER — ONDANSETRON HCL 4 MG/2ML IJ SOLN
INTRAMUSCULAR | Status: AC
  Administered 2017-01-29: 4 mg via INTRAVENOUS
  Filled 2017-01-29: qty 2

## 2017-01-29 MED ORDER — HYOSCYAMINE SULFATE 0.125 MG PO TABS
0.2500 mg | ORAL_TABLET | Freq: Once | ORAL | Status: AC
  Administered 2017-01-29: 0.25 mg via ORAL
  Filled 2017-01-29: qty 2

## 2017-01-29 MED ORDER — DICYCLOMINE HCL 10 MG/ML IM SOLN
20.0000 mg | Freq: Once | INTRAMUSCULAR | Status: AC
  Administered 2017-01-29: 20 mg via INTRAMUSCULAR
  Filled 2017-01-29: qty 2

## 2017-01-29 MED ORDER — DIPHENHYDRAMINE HCL 50 MG/ML IJ SOLN
25.0000 mg | Freq: Once | INTRAMUSCULAR | Status: AC
  Administered 2017-01-29: 25 mg via INTRAVENOUS
  Filled 2017-01-29: qty 1

## 2017-01-29 MED ORDER — HYOSCYAMINE SULFATE SL 0.125 MG SL SUBL
SUBLINGUAL_TABLET | SUBLINGUAL | 0 refills | Status: DC
Start: 2017-01-29 — End: 2017-04-20

## 2017-01-29 MED ORDER — SODIUM CHLORIDE 0.9 % IV BOLUS (SEPSIS)
500.0000 mL | Freq: Once | INTRAVENOUS | Status: AC
  Administered 2017-01-29: 500 mL via INTRAVENOUS

## 2017-01-29 MED ORDER — DICYCLOMINE HCL 20 MG PO TABS: 20.0000 mg | ORAL_TABLET | Freq: Once | ORAL | Status: DC

## 2017-01-29 MED ORDER — ONDANSETRON HCL 4 MG/2ML IJ SOLN
4.0000 mg | Freq: Once | INTRAMUSCULAR | Status: AC | PRN
  Administered 2017-01-29: 4 mg via INTRAVENOUS

## 2017-01-29 MED ORDER — METOCLOPRAMIDE HCL 5 MG/ML IJ SOLN
10.0000 mg | Freq: Once | INTRAMUSCULAR | Status: AC
  Administered 2017-01-29: 10 mg via INTRAVENOUS
  Filled 2017-01-29: qty 2

## 2017-01-29 NOTE — Discharge Instructions (Signed)
Use the zofran for nausea or vomiting. Try the levsin for your abdominal pain. Call the gastroenterology office on Monday to get an appointment, also follow up with Dr Biagio QuintLayton about your abdominal mesh.  Return to the ED if you get a fever or have uncontrolled vomiting.

## 2017-01-29 NOTE — ED Triage Notes (Signed)
Pt c/o abd pain with n/v/d that has continued since being seen in er on 01/26/2017

## 2017-01-29 NOTE — ED Provider Notes (Signed)
[facility]Y DEPT Provider Note   CSN: [id number] Arrival date & time: 01/29/17  0049  Time seen 01:17 AM   History   Chief Complaint Chief Complaint  Patient presents with  . Abdominal Pain    HPI Brittany [name] is a 36 y.o. female.  HPI  patient reports she's been having abdominal problems for the past 6 weeks. It initially started out with right flank pain and right lateral abdominal pain and she was diagnosed with a gallstone on x-ray. She came back and had ultrasound which verified gallstones she was seen by Dr. Lovell Sheehan and at that point her pain was starting to go over to her middle abdomen and left side and he felt it was not from her gallstone. He wanted her to see her surgeon in [place] who had done her hernia mesh about 4 years ago, Dr. Biagio Quint. She has not had a chance to see him yet. She came back to the ED on July 19 for continued abdominal pain and had a abdominal CT scan done. It did not give a etiology for her pain. She states she started having diarrhea today and has had about 5 episodes of loose and chunky diarrhea. She also started vomiting a couple hours ago and has vomited about 4 times. She denies blood in either. She denies fever. She states she has not been on antibiotics recently. She states now she has left-sided abdominal pain that is constant but it waxes and wanes and she describes it as sharp. It spreads diffusely across her abdomen. She also states her uterus hurts and is dull. She states movement and pressing on the abdomen make it feel worse. Nothing makes it feel better.  PCP Louie Boston., MD   Past Medical History:  Diagnosis Date  . Anxiety   . Carpal tunnel syndrome on left 10/12/2016  . Cellulitis and abscess of leg   . Depression   . Diabetes mellitus without complication (HCC)   . Lymphedema   . Sleep apnea    DX with sleep apnea - unable to use c-pap  . Umbilical hernia     Patient Active Problem List   Diagnosis Date Noted  .  Pressure injury of skin 10/24/2016  . Cellulitis and abscess of leg 10/23/2016  . Sleep apnea 10/23/2016  . Diabetes mellitus without complication (HCC) 10/23/2016  . Obesity, Class III, BMI 40-49.9 (morbid obesity) (HCC) 10/23/2016  . Cellulitis 10/23/2016  . Carpal tunnel syndrome on left 10/12/2016  . Candidal intertrigo 12/31/2014  . Morbid obesity (HCC) 12/29/2014  . Hyponatremia 12/29/2014  . Cellulitis of left lower extremity 12/29/2014  . DM (diabetes mellitus), type 2, uncontrolled (HCC) 01/04/2013    Past Surgical History:  Procedure Laterality Date  . CESAREAN SECTION  11/09/01  . INSERTION OF MESH N/A 01/02/2013   Procedure: INSERTION OF MESH;  Surgeon: Lodema Pilot, DO;  Location: [facility];  Service: General;  Laterality: N/A;  . UMBILICAL HERNIA REPAIR N/A 01/02/2013   Procedure: LAPAROSCOPIC UMBILICAL HERNIA;  Surgeon: Lodema Pilot, DO;  Location: [facility];  Service: General;  Laterality: N/A;    OB History    No data available       Home Medications    Prior to Admission medications   Medication Sig Start Date End Date Taking? Authorizing Provider  acetaminophen (TYLENOL) 500 MG tablet Take 500 mg by mouth every 6 (six) hours as needed for mild pain or moderate pain.   Yes [provider]  ibuprofen (ADVIL,MOTRIN)  200 MG tablet Take 800 mg by mouth every 6 (six) hours as needed for mild pain or moderate pain.    Yes [provider]  insulin aspart (NOVOLOG) 100 UNIT/ML injection SLIDING SCALE 3 TIMES DAILY WITH MEALS: For blood sugar 70-120-no insulin. Blood sugar 121-150 take 5 units. Blood sugar 151-208 take 8 units. Blood sugar 201-250 take 12 units. Blood sugar 251-300 take 17 units. Blood sugar 301-350 take 20 units. Blood sugar 351-400 take 25 units. Blood sugar greater than 400 take 28 units and call your doctor. 03/27/16  Yes Devoria Albe, MD  insulin glargine (LANTUS) 100 UNIT/ML injection Inject 0.55 mLs (55 Units total) into the skin at bedtime.  10/26/16  Yes Philip Aspen, Limmie Patricia, MD  Hyoscyamine Sulfate SL (LEVSIN/SL) 0.125 MG SUBL Take 1 or 2 SL Q 6hrs for pain 01/29/17   Devoria Albe, MD  ondansetron (ZOFRAN) 4 MG tablet Take 1 tablet (4 mg total) by mouth every 8 (eight) hours as needed for nausea or vomiting. 01/29/17   Devoria Albe, MD    Family History Family History  Problem Relation Age of Onset  . Cancer Mother        leukemia  . Diabetes Mother   . Hypertension Mother   . Hyperlipidemia Mother   . Other Father        Sciatica    Social History Social History  Substance Use Topics  . Smoking status: Never Smoker  . Smokeless tobacco: Never Used  . Alcohol use No  on disability for diabetes, lymphedema and obesity Lives with spouse  Allergies   Morphine and related; Latex; and Sulfa antibiotics   Review of Systems Review of Systems  All other systems reviewed and are negative.    Physical Exam Updated Vital Signs BP (!) 176/105   Pulse 96   Temp 98.2 F (36.8 C) (Oral)   Resp 20   Ht 5\' 11"  (1.803 m)   Wt (!) 242.2 kg (534 lb)   SpO2 100%   BMI 74.48 kg/m   Vital signs normal except for hypertension   Physical Exam  Constitutional: She is oriented to person, place, and time. She appears well-developed and well-nourished.  Non-toxic appearance. She does not appear ill. No distress.  Morbidly obese  HENT:  Head: Normocephalic and atraumatic.  Right Ear: External ear normal.  Left Ear: External ear normal.  Nose: Nose normal. No mucosal edema or rhinorrhea.  Mouth/Throat: Mucous membranes are dry. No dental abscesses or uvula swelling.  Eyes: Pupils are equal, round, and reactive to light. Conjunctivae and EOM are normal.  Neck: Normal range of motion and full passive range of motion without pain. Neck supple.  Cardiovascular: Normal rate, regular rhythm and normal heart sounds.  Exam reveals no gallop and no friction rub.   No murmur heard. Pulmonary/Chest: Effort normal and breath  sounds normal. No respiratory distress. She has no wheezes. She has no rhonchi. She has no rales. She exhibits no tenderness and no crepitus.  Abdominal: Soft. Normal appearance and bowel sounds are normal. She exhibits no distension. There is tenderness. There is no rebound and no guarding.    Musculoskeletal: Normal range of motion. She exhibits no edema or tenderness.  Moves all extremities well.   Neurological: She is alert and oriented to person, place, and time. She has normal strength. No cranial nerve deficit.  Skin: Skin is warm, dry and intact. No rash noted. No erythema. No pallor.  Psychiatric: She has a normal mood  and affect. Her speech is normal and behavior is normal. Her mood appears not anxious.  Nursing note and vitals reviewed.    ED Treatments / Results  Labs (all labs ordered are listed, but only abnormal results are displayed) Results for orders placed or performed during the hospital encounter of 01/29/17  Lipase, blood  Result Value Ref Range   Lipase 23 11 - 51 U/L  Comprehensive metabolic panel  Result Value Ref Range   Sodium 137 135 - 145 mmol/L   Potassium 3.9 3.5 - 5.1 mmol/L   Chloride 102 101 - 111 mmol/L   CO2 24 22 - 32 mmol/L   Glucose, Bld 280 (H) 65 - 99 mg/dL   BUN 9 6 - 20 mg/dL   Creatinine, Ser 1.610.72 0.44 - 1.00 mg/dL   Calcium 9.6 8.9 - 09.610.3 mg/dL   Total Protein 8.4 (H) 6.5 - 8.1 g/dL   Albumin 4.0 3.5 - 5.0 g/dL   AST 26 15 - 41 U/L   ALT 30 14 - 54 U/L   Alkaline Phosphatase 93 38 - 126 U/L   Total Bilirubin 0.9 0.3 - 1.2 mg/dL   GFR calc non Af Amer >60 >60 mL/min   GFR calc Af Amer >60 >60 mL/min   Anion gap 11 5 - 15  CBC  Result Value Ref Range   WBC 8.0 4.0 - 10.5 K/uL   RBC 5.25 (H) 3.87 - 5.11 MIL/uL   Hemoglobin 14.9 12.0 - 15.0 g/dL   HCT 04.544.3 40.936.0 - 81.146.0 %   MCV 84.4 78.0 - 100.0 fL   MCH 28.4 26.0 - 34.0 pg   MCHC 33.6 30.0 - 36.0 g/dL   RDW 91.414.2 78.211.5 - 95.615.5 %   Platelets 199 150 - 400 K/uL  Urinalysis, Routine  w reflex microscopic  Result Value Ref Range   Color, Urine YELLOW YELLOW   APPearance HAZY (A) CLEAR   Specific Gravity, Urine 1.020 1.005 - 1.030   pH 5.0 5.0 - 8.0   Glucose, UA 150 (A) NEGATIVE mg/dL   Hgb urine dipstick SMALL (A) NEGATIVE   Bilirubin Urine NEGATIVE NEGATIVE   Ketones, ur NEGATIVE NEGATIVE mg/dL   Protein, ur NEGATIVE NEGATIVE mg/dL   Nitrite NEGATIVE NEGATIVE   Leukocytes, UA NEGATIVE NEGATIVE   RBC / HPF 0-5 0 - 5 RBC/hpf   WBC, UA 0-5 0 - 5 WBC/hpf   Bacteria, UA NONE SEEN NONE SEEN   Squamous Epithelial / LPF 6-30 (A) NONE SEEN   Mucous PRESENT    Laboratory interpretation all normal except Hyperglycemia   EKG  EKG Interpretation None       Radiology No results found.   Ct Abdomen Pelvis W Contrast  Result Date: 01/26/2017 CLINICAL DATA:  Initial evaluation for acute generalized abdominal pain with nausea and vomiting for 1 month. . IMPRESSION: 1. No CT evidence for acute intra-abdominal or pelvic process. 2. Cholelithiasis. 3. Splenomegaly. 4. Mildly enlarged bilateral external iliac and left pelvic sidewall adenopathy is above, of uncertain etiology or significance, but may be reactive. 5. IUD in place within the uterus. 6. Fat containing paraumbilical hernia without associated inflammation. 7. 5 mm right lower lobe pulmonary nodule, indeterminate. No follow-up needed if patient is low-risk. Non-contrast chest CT can be considered in 12 months if patient is high-risk. This recommendation follows the consensus statement: Guidelines for Management of Incidental Pulmonary Nodules Detected on CT Images: From the Fleischner Society 2017; Radiology 2017; 284:228-243. Electronically Signed   By: Sharlet SalinaBenjamin  Phill Myron M.D.   On: 01/26/2017 23:08   US Renal  Result Date: 01/10/2017 CLINICAL DATA:  Right upper quadrant pain . IMPRESSION: 1. Negative renal ultrasound. 2. Sensitivity is diminished by patient body habitus. Electronically Signed   By: Marnee Spring  M.D.   On: 01/10/2017 08:32   Dg Abd Acute W/chest  Result Date: 01/06/2017 CLINICAL DATA:  Right abdominal pain for 1 week.. IMPRESSION: 4 cm right upper abdominal calcification - likely gallstone. Unremarkable bowel gas pattern. No evidence of acute cardiopulmonary disease. Electronically Signed   By: Harmon Pier M.D.   On: 01/06/2017 23:14   US Abdomen Limited Ruq  Result Date: 01/10/2017 CLINICAL DATA:  Right upper quadrant pain .  Right flank pain  IMPRESSION: 1. Gallstones, the largest is 3.1 cm gallbladder wall thickness is 3 mm. This borderline prominent. Cholecystitis cannot be excluded. Negative Murphy sign. Exam limited due to patient's body habitus. 2. Increased hepatic echogenicity suggesting fatty infiltration and/or hepatocellular disease. Electronically Signed   By: Maisie Fus  Register   On: 01/10/2017 08:40     Procedures Procedures (including critical care time)  Medications Ordered in ED Medications  ondansetron (ZOFRAN) injection 4 mg (4 mg Intravenous Given 01/29/17 0157)  sodium chloride 0.9 % bolus 1,000 mL (0 mLs Intravenous Stopped 01/29/17 0303)  sodium chloride 0.9 % bolus 500 mL (0 mLs Intravenous Stopped 01/29/17 0503)  dicyclomine (BENTYL) injection 20 mg (20 mg Intramuscular Given 01/29/17 0200)  metoCLOPramide (REGLAN) injection 10 mg (10 mg Intravenous Given 01/29/17 0247)  diphenhydrAMINE (BENADRYL) injection 25 mg (25 mg Intravenous Given 01/29/17 0247)  hyoscyamine (LEVSIN, ANASPAZ) tablet 0.25 mg (0.25 mg Oral Given 01/29/17 0436)     Initial Impression / Assessment and Plan / ED Course  I have reviewed the triage vital signs and the nursing notes.  Pertinent labs & imaging results that were available during my care of the patient were reviewed by me and considered in my medical decision making (see chart for details).  Patient was given IV fluids for dehydration and given Bentyl for her abdominal pain and Zofran for her nausea.  Patient was given Reglan  and Benadryl IV for continued complaints.  Recheck at 02:50 AM pt was given her blood  test results which were all normal.  She is wanting to know what is causing her pain and I told her I didn't know. She needs to f/u with Dr Biagio Quint, her surgeon as previously suggested by Dr Lovell Sheehan or see a gastroenterologist.   Patient states her nausea is better however she continued to have pain. She was given Levsin 0.25 mg sublingual at 4:30 AM.  5:40 AM patient states her nausea is gone, her pain is much improved. She states she just has soreness now. She feels ready to be discharged home. She states she has a phone number for the gastroenterologist to follow-up with. She will try to call their office on Monday, July 23. At this point I suspect her abdominal pain may be more of irritable bowel or some type of intestinal spasms. We'll discharge her home with more levsin.   Final Clinical Impressions(s) / ED Diagnoses   Final diagnoses:  Generalized abdominal pain    New Prescriptions New Prescriptions   HYOSCYAMINE SULFATE SL (LEVSIN/SL) 0.125 MG SUBL    Take 1 or 2 SL Q 6hrs for pain   ONDANSETRON (ZOFRAN) 4 MG TABLET    Take 1 tablet (4 mg total) by mouth every 8 (eight) hours as needed for nausea  or vomiting.    Plan discharge  Devoria Albe, MD, Concha Pyo, MD 01/29/17 [phone]

## 2017-01-29 NOTE — ED Notes (Signed)
Pt wheeled to waiting room. Pt verbalized understanding of discharge instructions.   

## 2017-04-20 ENCOUNTER — Encounter: Payer: Self-pay | Admitting: Obstetrics and Gynecology

## 2017-04-20 ENCOUNTER — Ambulatory Visit (INDEPENDENT_AMBULATORY_CARE_PROVIDER_SITE_OTHER): Payer: Medicaid Other | Admitting: Obstetrics and Gynecology

## 2017-04-20 VITALS — BP 136/80 | HR 103 | Ht 71.0 in | Wt >= 6400 oz

## 2017-04-20 DIAGNOSIS — Z30432 Encounter for removal of intrauterine contraceptive device: Secondary | ICD-10-CM

## 2017-04-20 DIAGNOSIS — Z6841 Body Mass Index (BMI) 40.0 and over, adult: Secondary | ICD-10-CM

## 2017-04-20 NOTE — Progress Notes (Signed)
[facility] CLINIC PROCEDURE NOTE  Brittany [name] is a 36 y.o. No obstetric history on file. here for Mirena IUD removal. Last pap is unknown.  Pt has had IUD in place for 12 years, and says the string is inside her uterus. Pt is c/o dyspareunia, and states it feels like "barbed wire". Laminaria discussed as option if unsuccessful removal  Procedure made more difficult due to anatomy due to pt weight and body habitus, with weight 516 pounds, Body mass index is 72.13 kg/m. THe pt has massive lymphedema of inner legs bilaterally.    IUD Removal  Patient was in the dorsal lithotomy position, normal external genitalia was noted.   2 other specula unsuccessful, but  A snowman version of the Graves speculum was placed in the patient's vagina, normal discharge was noted, no lesions. The multiparous cervix was visualized, no lesions, no abnormal discharge;  and the cervix was swabbed with Betadine using scopettes. The strings of the IUD were not visible but after probing the lower uterine segment with the uterine packing forceps, the IUD tip  were grasped and pulled using the forceps.  The IUD was successfully removed in its entirety. Patient tolerated the procedure well.  Partner kept the IUD.  Future contraception: Pt wants to see if her problems (abdominal pain and cramping) will resolve with IUD removal, and then she will consider future contraception options.      By signing my name below, I, Brittany [name], attest that this documentation has been prepared under the direction and in the presence of Tilda Burrow, MD. Electronically Signed: Redge Gainer, Medical Scribe. 04/20/17. 1:48 PM.  I personally performed the services described in this documentation, which was SCRIBED in my presence. The recorded information has been reviewed and considered accurate. It has been edited as necessary during review. Tilda Burrow, MD

## 2017-06-30 ENCOUNTER — Encounter (HOSPITAL_COMMUNITY): Payer: Self-pay | Admitting: Emergency Medicine

## 2017-06-30 ENCOUNTER — Emergency Department (HOSPITAL_COMMUNITY)
Admission: EM | Admit: 2017-06-30 | Discharge: 2017-06-30 | Disposition: A | Discharge status: Home / Self Care | Payer: Medicaid Other | Attending: Emergency Medicine | Admitting: Emergency Medicine

## 2017-06-30 ENCOUNTER — Other Ambulatory Visit: Payer: Self-pay

## 2017-06-30 DIAGNOSIS — Z91040 Latex allergy status: Secondary | ICD-10-CM | POA: Diagnosis not present

## 2017-06-30 DIAGNOSIS — E119 Type 2 diabetes mellitus without complications: Secondary | ICD-10-CM | POA: Insufficient documentation

## 2017-06-30 DIAGNOSIS — Z79899 Other long term (current) drug therapy: Secondary | ICD-10-CM | POA: Insufficient documentation

## 2017-06-30 DIAGNOSIS — Z794 Long term (current) use of insulin: Secondary | ICD-10-CM | POA: Diagnosis not present

## 2017-06-30 DIAGNOSIS — R1012 Left upper quadrant pain: Secondary | ICD-10-CM | POA: Diagnosis not present

## 2017-06-30 LAB — URINALYSIS, ROUTINE W REFLEX MICROSCOPIC
BACTERIA UA: NONE SEEN
BILIRUBIN URINE: NEGATIVE
Glucose, UA: >=500 mg/dL — AB
HGB URINE DIPSTICK: NEGATIVE
Ketones, ur: NEGATIVE mg/dL
Leukocytes, UA: NEGATIVE
NITRITE: NEGATIVE
PROTEIN: NEGATIVE mg/dL
RBC / HPF: NONE SEEN RBC/hpf (ref 0–5)
Specific Gravity, Urine: 1.033 — ABNORMAL HIGH (ref 1.005–1.030)
pH: 5 (ref 5.0–8.0)

## 2017-06-30 LAB — LIPASE, BLOOD: Lipase: 29 U/L (ref 11–51)

## 2017-06-30 LAB — COMPREHENSIVE METABOLIC PANEL
ALBUMIN: 3.5 g/dL (ref 3.5–5.0)
ALK PHOS: 99 U/L (ref 38–126)
ALT: 24 U/L (ref 14–54)
ANION GAP: 12 (ref 5–15)
AST: 26 U/L (ref 15–41)
BUN: 12 mg/dL (ref 6–20)
CALCIUM: 8.8 mg/dL — AB (ref 8.9–10.3)
CO2: 21 mmol/L — AB (ref 22–32)
Chloride: 105 mmol/L (ref 101–111)
Creatinine, Ser: 0.82 mg/dL (ref 0.44–1.00)
GFR calc non Af Amer: >60 mL/min (ref >60)
GLUCOSE: 440 mg/dL — AB (ref 65–99)
POTASSIUM: 4 mmol/L (ref 3.5–5.1)
SODIUM: 138 mmol/L (ref 135–145)
TOTAL PROTEIN: 7.9 g/dL (ref 6.5–8.1)
Total Bilirubin: 0.4 mg/dL (ref 0.3–1.2)

## 2017-06-30 LAB — CBC
HEMATOCRIT: 43.2 % (ref 36.0–46.0)
HEMOGLOBIN: 14.1 g/dL (ref 12.0–15.0)
MCH: 28.7 pg (ref 26.0–34.0)
MCHC: 32.6 g/dL (ref 30.0–36.0)
MCV: 88 fL (ref 78.0–100.0)
Platelets: 201 10*3/uL (ref 150–400)
RBC: 4.91 MIL/uL (ref 3.87–5.11)
RDW: 13.7 % (ref 11.5–15.5)
WBC: 6.3 10*3/uL (ref 4.0–10.5)

## 2017-06-30 LAB — ETHANOL: Alcohol, Ethyl (B): <10 mg/dL (ref <10)

## 2017-06-30 LAB — I-STAT BETA HCG BLOOD, ED (MC, WL, AP ONLY)

## 2017-06-30 MED ORDER — SUCRALFATE 1 G PO TABS: 1.0000 g | ORAL_TABLET | Freq: Three times a day (TID) | ORAL | 0 refills | Status: DC

## 2017-06-30 MED ORDER — FENTANYL CITRATE (PF) 100 MCG/2ML IJ SOLN
50.0000 ug | Freq: Once | INTRAMUSCULAR | Status: AC
  Administered 2017-06-30: 50 ug via INTRAVENOUS
  Filled 2017-06-30: qty 2

## 2017-06-30 MED ORDER — ONDANSETRON HCL 4 MG/2ML IJ SOLN
4.0000 mg | Freq: Once | INTRAMUSCULAR | Status: AC
  Administered 2017-06-30: 4 mg via INTRAVENOUS
  Filled 2017-06-30: qty 2

## 2017-06-30 MED ORDER — OMEPRAZOLE 20 MG PO CPDR: 20.0000 mg | DELAYED_RELEASE_CAPSULE | Freq: Every day | ORAL | 0 refills | Status: DC

## 2017-06-30 MED ORDER — SODIUM CHLORIDE 0.9 % IV BOLUS (SEPSIS)
1000.0000 mL | Freq: Once | INTRAVENOUS | Status: AC
  Administered 2017-06-30: 1000 mL via INTRAVENOUS

## 2017-06-30 NOTE — ED Notes (Signed)
Pt alert & oriented x4, stable gait. Patient given discharge instructions, paperwork & prescription(s). Patient verbalized understanding. Pt left department in wheelchair escorted by staff. Pt left w/ no further questions. 

## 2017-06-30 NOTE — ED Notes (Signed)
Pt presents to the ER w/ lower abdominal pain. Pt states more pain & vomiting than normal. Had a bowel movement this morning that was more hard than normal.

## 2017-06-30 NOTE — ED Provider Notes (Signed)
[facility] EMERGENCY DEPARTMENT Provider Note   CSN: [id number] Arrival date & time: 06/30/17  1829     History   Chief Complaint Chief Complaint  Patient presents with  . Abdominal Pain    HPI SYMPHANY FLEISSNER is a 36 y.o. female.  HPI  Patient presents with abdominal pain. Patient has multiple medical issues including morbid obesity, gastroparesis, diabetes, cholelithiasis. Onset is unclear, but at least over the past few days the patient has had increasing pain in her left upper quadrant. There is associated nausea Patient has baseline post prandial emesis, notes that her pattern of emesis has changed somewhat, though it is not entirely clear how much. No fever, no chills, no diarrhea Indeed, the patient had constipation, that was relieved today after using stool softener.   Past Medical History:  Diagnosis Date  . Anxiety   . Carpal tunnel syndrome on left 10/12/2016  . Cellulitis and abscess of leg   . Depression   . Diabetes mellitus without complication (HCC)   . Gallstones   . Lymphedema   . Sleep apnea    DX with sleep apnea - unable to use c-pap  . Umbilical hernia     Patient Active Problem List   Diagnosis Date Noted  . Encounter for IUD removal 04/20/2017  . Pressure injury of skin 10/24/2016  . Cellulitis and abscess of leg 10/23/2016  . Sleep apnea 10/23/2016  . Diabetes mellitus without complication (HCC) 10/23/2016  . Morbid obesity with body mass index (BMI) greater than or equal to 70 in adult [facility]) 10/23/2016  . Cellulitis 10/23/2016  . Carpal tunnel syndrome on left 10/12/2016  . Candidal intertrigo 12/31/2014  . Morbid obesity (HCC) 12/29/2014  . Hyponatremia 12/29/2014  . Cellulitis of left lower extremity 12/29/2014  . DM (diabetes mellitus), type 2, uncontrolled (HCC) 01/04/2013    Past Surgical History:  Procedure Laterality Date  . CESAREAN SECTION  11/09/01  . INSERTION OF MESH N/A 01/02/2013   Procedure: INSERTION OF MESH;   Surgeon: Lodema Pilot, DO;  Location: [facility];  Service: General;  Laterality: N/A;  . UMBILICAL HERNIA REPAIR N/A 01/02/2013   Procedure: LAPAROSCOPIC UMBILICAL HERNIA;  Surgeon: Lodema Pilot, DO;  Location: [facility];  Service: General;  Laterality: N/A;    OB History    Gravida Para Term Preterm AB Living   3 1 1   2 1    SAB TAB Ectopic Multiple Live Births   2       1       Home Medications    Prior to Admission medications   Medication Sig Start Date End Date Taking? Authorizing Provider  ibuprofen (ADVIL,MOTRIN) 200 MG tablet Take 800 mg by mouth every 6 (six) hours as needed for mild pain or moderate pain.    Yes [provider]  insulin aspart (NOVOLOG) 100 UNIT/ML injection SLIDING SCALE 3 TIMES DAILY WITH MEALS: For blood sugar 70-120-no insulin. Blood sugar 121-150 take 5 units. Blood sugar 151-208 take 8 units. Blood sugar 201-250 take 12 units. Blood sugar 251-300 take 17 units. Blood sugar 301-350 take 20 units. Blood sugar 351-400 take 25 units. Blood sugar greater than 400 take 28 units and call your doctor. 03/27/16  Yes Devoria Albe, MD  insulin glargine (LANTUS) 100 UNIT/ML injection Inject 0.55 mLs (55 Units total) into the skin at bedtime. 10/26/16  Yes Philip Aspen, Limmie Patricia, MD  ondansetron (ZOFRAN) 4 MG tablet Take 4 mg by mouth every 8 (eight) hours as  needed for nausea or vomiting.   Yes [provider]    Family History Family History  Problem Relation Age of Onset  . Cancer Mother        leukemia  . Diabetes Mother   . Hypertension Mother   . Hyperlipidemia Mother   . Other Father        Sciatica  . Alcohol abuse Paternal Grandfather   . Other Paternal Grandmother        bowel obstruction; pneumonia  . Cancer Maternal Grandfather        lung  . ADD / ADHD Daughter   . Depression Daughter   . Anxiety disorder Daughter     Social History Social History   Tobacco Use  . Smoking status: Never Smoker  . Smokeless tobacco: Never Used   Substance Use Topics  . Alcohol use: No  . Drug use: No     Allergies   Morphine and related; Latex; and Sulfa antibiotics   Review of Systems Review of Systems  Constitutional:       Per HPI, otherwise negative  HENT:       Per HPI, otherwise negative  Respiratory:       Per HPI, otherwise negative  Cardiovascular:       Per HPI, otherwise negative  Gastrointestinal: Positive for abdominal pain, nausea and vomiting.  Endocrine:       Negative aside from HPI  Genitourinary:       Neg aside from HPI   Musculoskeletal:       Per HPI, otherwise negative  Skin: Negative.   Neurological: Negative for syncope.     Physical Exam Updated Vital Signs BP (!) 131/94 (BP Location: Left Wrist)   Pulse 89   Temp 97.6 F (36.4 C) (Tympanic)   Resp 16   Ht 5\' 10"  (1.778 m)   Wt (!) 265.4 kg (585 lb)   SpO2 99%   BMI 83.94 kg/m   Physical Exam  Constitutional: She is oriented to person, place, and time. She appears well-developed and well-nourished. No distress.  Morbidly obese female sitting upright speaking clearly  HENT:  Head: Normocephalic and atraumatic.  Tongue piercing  Eyes: Conjunctivae and EOM are normal.  Cardiovascular: Normal rate and regular rhythm.  Pulmonary/Chest: Effort normal and breath sounds normal. No stridor. No respiratory distress.  Abdominal: She exhibits no distension.  Abdominal girth limits exam somewhat, but there is some mild tenderness to palpation in the left upper quadrant  Musculoskeletal: She exhibits no edema.  Neurological: She is alert and oriented to person, place, and time. No cranial nerve deficit.  Skin: Skin is warm and dry.  Psychiatric: She has a normal mood and affect.  Nursing note and vitals reviewed.    ED Treatments / Results  Labs (all labs ordered are listed, but only abnormal results are displayed) Labs Reviewed  COMPREHENSIVE METABOLIC PANEL - Abnormal; Notable for the following components:      Result Value    CO2 21 (*)    Glucose, Bld 440 (*)    Calcium 8.8 (*)    All other components within normal limits  LIPASE, BLOOD  CBC  ETHANOL  URINALYSIS, ROUTINE W REFLEX MICROSCOPIC  I-STAT BETA HCG BLOOD, ED ([facility], [facility], [facility] ONLY)     Procedures Procedures (including critical care time)  Medications Ordered in ED Medications  sodium chloride 0.9 % bolus 1,000 mL (0 mLs Intravenous Stopped 06/30/17 2103)  ondansetron (ZOFRAN) injection 4 mg (4 mg Intravenous  Given 06/30/17 1955)  fentaNYL (SUBLIMAZE) injection 50 mcg (50 mcg Intravenous Given 06/30/17 1955)     Initial Impression / Assessment and Plan / ED Course  I have reviewed the triage vital signs and the nursing notes.  Pertinent labs & imaging results that were available during my care of the patient were reviewed by me and considered in my medical decision making (see chart for details).  10:15 PM On repeat exam patient is awake alert, sitting upright, in no distress, appears more comfortable than arrival. We discussed the patient's episodic abdominal pain, absence of seeing a gastroenterologist thus far.  His evaluation generally reassuring, no evidence for pancreatitis, peritonitis. Patient has no urinary symptoms.  With some suspicion for gastric etiology patient will start PPI therapy, follow-up with GI.    Final Clinical Impressions(s) / ED Diagnoses  Abdominal pain   Gerhard MunchLockwood, Liylah Najarro, MD 06/30/17 2216

## 2017-06-30 NOTE — ED Triage Notes (Signed)
L lower abd pain for 3 days  Hard BM today Followed by Dr Margo Commonapper  Pt is odoriferous and morbidly obese

## 2017-07-08 ENCOUNTER — Other Ambulatory Visit: Payer: Self-pay

## 2017-07-08 ENCOUNTER — Encounter (HOSPITAL_COMMUNITY): Payer: Self-pay | Admitting: *Deleted

## 2017-07-08 ENCOUNTER — Emergency Department (HOSPITAL_COMMUNITY)
Admission: EM | Admit: 2017-07-08 | Discharge: 2017-07-08 | Disposition: A | Discharge status: Home / Self Care | Payer: Medicaid Other | Attending: Emergency Medicine | Admitting: Emergency Medicine

## 2017-07-08 DIAGNOSIS — R1012 Left upper quadrant pain: Secondary | ICD-10-CM | POA: Insufficient documentation

## 2017-07-08 DIAGNOSIS — Z91040 Latex allergy status: Secondary | ICD-10-CM | POA: Insufficient documentation

## 2017-07-08 DIAGNOSIS — Z79899 Other long term (current) drug therapy: Secondary | ICD-10-CM | POA: Insufficient documentation

## 2017-07-08 DIAGNOSIS — Z794 Long term (current) use of insulin: Secondary | ICD-10-CM | POA: Insufficient documentation

## 2017-07-08 DIAGNOSIS — E119 Type 2 diabetes mellitus without complications: Secondary | ICD-10-CM | POA: Diagnosis not present

## 2017-07-08 LAB — COMPREHENSIVE METABOLIC PANEL
ALT: 26 U/L (ref 14–54)
AST: 22 U/L (ref 15–41)
Albumin: 3.5 g/dL (ref 3.5–5.0)
Alkaline Phosphatase: 95 U/L (ref 38–126)
Anion gap: 9 (ref 5–15)
BUN: 11 mg/dL (ref 6–20)
CO2: 20 mmol/L — ABNORMAL LOW (ref 22–32)
Calcium: 8.7 mg/dL — ABNORMAL LOW (ref 8.9–10.3)
Chloride: 102 mmol/L (ref 101–111)
Creatinine, Ser: 0.67 mg/dL (ref 0.44–1.00)
GFR calc Af Amer: >60 mL/min (ref >60)
GFR calc non Af Amer: >60 mL/min (ref >60)
Glucose, Bld: 363 mg/dL — ABNORMAL HIGH (ref 65–99)
Potassium: 4.4 mmol/L (ref 3.5–5.1)
Sodium: 131 mmol/L — ABNORMAL LOW (ref 135–145)
Total Bilirubin: 0.7 mg/dL (ref 0.3–1.2)
Total Protein: 7.6 g/dL (ref 6.5–8.1)

## 2017-07-08 LAB — URINALYSIS, ROUTINE W REFLEX MICROSCOPIC
BILIRUBIN URINE: NEGATIVE
Glucose, UA: >=500 mg/dL — AB
Hgb urine dipstick: NEGATIVE
KETONES UR: NEGATIVE mg/dL
LEUKOCYTES UA: NEGATIVE
NITRITE: NEGATIVE
PROTEIN: NEGATIVE mg/dL
Specific Gravity, Urine: 1.025 (ref 1.005–1.030)
pH: 5.5 (ref 5.0–8.0)

## 2017-07-08 LAB — CBC
HEMATOCRIT: 42.4 % (ref 36.0–46.0)
Hemoglobin: 13.9 g/dL (ref 12.0–15.0)
MCH: 28.3 pg (ref 26.0–34.0)
MCHC: 32.8 g/dL (ref 30.0–36.0)
MCV: 86.2 fL (ref 78.0–100.0)
PLATELETS: 197 10*3/uL (ref 150–400)
RBC: 4.92 MIL/uL (ref 3.87–5.11)
RDW: 13.6 % (ref 11.5–15.5)
WBC: 7.3 10*3/uL (ref 4.0–10.5)

## 2017-07-08 LAB — URINALYSIS, MICROSCOPIC (REFLEX)
Bacteria, UA: NONE SEEN
RBC / HPF: NONE SEEN RBC/hpf (ref 0–5)
Squamous Epithelial / LPF: NONE SEEN
WBC, UA: NONE SEEN WBC/hpf (ref 0–5)

## 2017-07-08 LAB — I-STAT BETA HCG BLOOD, ED (MC, WL, AP ONLY): I-stat hCG, quantitative: <5 m[IU]/mL (ref <5)

## 2017-07-08 LAB — LIPASE, BLOOD: Lipase: 30 U/L (ref 11–51)

## 2017-07-08 MED ORDER — FENTANYL CITRATE (PF) 100 MCG/2ML IJ SOLN
50.0000 ug | Freq: Once | INTRAMUSCULAR | Status: AC
  Administered 2017-07-08: 50 ug via INTRAVENOUS
  Filled 2017-07-08: qty 2

## 2017-07-08 MED ORDER — GI COCKTAIL ~~LOC~~
30.0000 mL | Freq: Once | ORAL | Status: AC
  Administered 2017-07-08: 30 mL via ORAL
  Filled 2017-07-08: qty 30

## 2017-07-08 MED ORDER — SUCRALFATE 1 GM/10ML PO SUSP: 1.0000 g | Freq: Three times a day (TID) | ORAL | 0 refills | Status: DC

## 2017-07-08 MED ORDER — SODIUM CHLORIDE 0.9 % IV BOLUS (SEPSIS)
1000.0000 mL | Freq: Once | INTRAVENOUS | Status: AC
  Administered 2017-07-08: 1000 mL via INTRAVENOUS

## 2017-07-08 MED ORDER — ONDANSETRON HCL 4 MG/2ML IJ SOLN
4.0000 mg | Freq: Once | INTRAMUSCULAR | Status: AC
  Administered 2017-07-08: 4 mg via INTRAVENOUS
  Filled 2017-07-08: qty 2

## 2017-07-08 NOTE — Discharge Instructions (Signed)
You were seen here today for abdominal pain.  Your lab work and urinalysis was reassuring.  I is important that you follow-up with gastroenterology.  Please take Carafate as directed.  Please continue taking Prilosec as directed (given at previous visit).  Please follow-up with PCP this week. If you develop worsening or new concerning symptoms you can return to the emergency department for re-evaluation.

## 2017-07-08 NOTE — ED Notes (Signed)
Pt understood dc material. NAD noted. Script given at dc  

## 2017-07-08 NOTE — ED Provider Notes (Signed)
[facility] EMERGENCY DEPARTMENT Provider Note   CSN: [id number] Arrival date & time: 07/08/17  1428     History   Chief Complaint Chief Complaint  Patient presents with  . Abdominal Pain    HPI Brittany LECKER is a 36 y.o. female with a past medical history including morbid obesity, gastroparesis, diabetes, cholelithiasis who presents the emerge department today for left upper quadrant abdominal pain.  [facility]/2017 where she had reassuring labs.  Patient was advised to follow-up with gastroenterology and start taking a PPI and given zofran.   Patient is presenting today for continued pain.  Patient states that over the last 2 weeks she has been having increasing pain in her left upper quadrant.  Last night the pain increased more than normal.  There is associated nausea.  She states she normally has postprandial emesis but since having the zofran at home she has not had this.  Patient notes that the pain is worse with palpation and movement.  Pain is also worse during times of eating.  She was supposed to have a GI emptying study but has not scheduled this.  She is also not followed up with GI.  She took her PPI the first few days that helped and stopped taking it since.  She has not been taking any pain medication for this.  The patient notes that she has had some constipation that is controlled by stool softners. Patient denies any fever, chills, chest pain, shortness of breath, cough, hemoptysis, diarrhea, hematochezia, melena, painful bowel movements, or urinary symptoms.  Patient is still passing gas.  Patient's past previous abdominal surgeries including umbilical hernia repair.  HPI  Past Medical History:  Diagnosis Date  . Anxiety   . Carpal tunnel syndrome on left 10/12/2016  . Cellulitis and abscess of leg   . Depression   . Diabetes mellitus without complication (HCC)   . Gallstones   . Lymphedema   .  Sleep apnea    DX with sleep apnea - unable to use c-pap  . Umbilical hernia     Patient Active Problem List   Diagnosis Date Noted  . Encounter for IUD removal 04/20/2017  . Pressure injury of skin 10/24/2016  . Cellulitis and abscess of leg 10/23/2016  . Sleep apnea 10/23/2016  . Diabetes mellitus without complication (HCC) 10/23/2016  . Morbid obesity with body mass index (BMI) greater than or equal to 70 in adult [facility]) 10/23/2016  . Cellulitis 10/23/2016  . Carpal tunnel syndrome on left 10/12/2016  . Candidal intertrigo 12/31/2014  . Morbid obesity (HCC) 12/29/2014  . Hyponatremia 12/29/2014  . Cellulitis of left lower extremity 12/29/2014  . DM (diabetes mellitus), type 2, uncontrolled (HCC) 01/04/2013    Past Surgical History:  Procedure Laterality Date  . CESAREAN SECTION  11/09/01  . INSERTION OF MESH N/A 01/02/2013   Procedure: INSERTION OF MESH;  Surgeon: Lodema Pilot, DO;  Location: [facility] ORS;  Service: General;  Laterality: N/A;  . UMBILICAL HERNIA REPAIR N/A 01/02/2013   Procedure: LAPAROSCOPIC UMBILICAL HERNIA;  Surgeon: Lodema Pilot, DO;  Location: [facility];  Service: General;  Laterality: N/A;    OB History    Gravida Para Term Preterm AB Living   3 1 1   2 1    SAB TAB Ectopic Multiple Live Births   2       1       Home Medications    Prior  to Admission medications   Medication Sig Start Date End Date Taking? Authorizing Provider  ibuprofen (ADVIL,MOTRIN) 200 MG tablet Take 800 mg by mouth every 6 (six) hours as needed for mild pain or moderate pain.     [provider]  insulin aspart (NOVOLOG) 100 UNIT/ML injection SLIDING SCALE 3 TIMES DAILY WITH MEALS: For blood sugar 70-120-no insulin. Blood sugar 121-150 take 5 units. Blood sugar 151-208 take 8 units. Blood sugar 201-250 take 12 units. Blood sugar 251-300 take 17 units. Blood sugar 301-350 take 20 units. Blood sugar 351-400 take 25 units. Blood sugar greater than 400 take 28 units and call your  doctor. 03/27/16   Devoria Albe, MD  insulin glargine (LANTUS) 100 UNIT/ML injection Inject 0.55 mLs (55 Units total) into the skin at bedtime. 10/26/16   Philip Aspen, Limmie Patricia, MD  omeprazole (PRILOSEC) 20 MG capsule Take 1 capsule (20 mg total) by mouth daily. Take one tablet daily 06/30/17   Gerhard Munch, MD  ondansetron (ZOFRAN) 4 MG tablet Take 4 mg by mouth every 8 (eight) hours as needed for nausea or vomiting.    [provider]  sucralfate (CARAFATE) 1 g tablet Take 1 tablet (1 g total) by mouth 4 (four) times daily -  with meals and at bedtime. 06/30/17   Gerhard Munch, MD    Family History Family History  Problem Relation Age of Onset  . Cancer Mother        leukemia  . Diabetes Mother   . Hypertension Mother   . Hyperlipidemia Mother   . Other Father        Sciatica  . Alcohol abuse Paternal Grandfather   . Other Paternal Grandmother        bowel obstruction; pneumonia  . Cancer Maternal Grandfather        lung  . ADD / ADHD Daughter   . Depression Daughter   . Anxiety disorder Daughter     Social History Social History   Tobacco Use  . Smoking status: Never Smoker  . Smokeless tobacco: Never Used  Substance Use Topics  . Alcohol use: No  . Drug use: No     Allergies   Morphine and related; Latex; and Sulfa antibiotics   Review of Systems Review of Systems  All other systems reviewed and are negative.    Physical Exam Updated Vital Signs BP 140/73   Pulse 77   Temp 98 F (36.7 C) (Oral)   Resp 16   SpO2 99%   Physical Exam  Constitutional: She appears well-developed and well-nourished.  Morbidly obese female in no acute distress speaking in full sentences without difficulty.  HENT:  Head: Normocephalic and atraumatic.  Right Ear: External ear normal.  Left Ear: External ear normal.  Nose: Nose normal.  Mouth/Throat: Uvula is midline, oropharynx is clear and moist and mucous membranes are normal. No tonsillar exudate.    Eyes: Pupils are equal, round, and reactive to light. Right eye exhibits no discharge. Left eye exhibits no discharge. No scleral icterus.  Neck: Trachea normal. Neck supple. No spinous process tenderness present. No neck rigidity. Normal range of motion present.  Cardiovascular: Normal rate, regular rhythm and intact distal pulses.  No murmur heard. Pulses:      Radial pulses are 2+ on the right side, and 2+ on the left side.       Dorsalis pedis pulses are 2+ on the right side, and 2+ on the left side.  Posterior tibial pulses are 2+ on the right side, and 2+ on the left side.  No lower extremity swelling or edema. Calves symmetric in size bilaterally.  Pulmonary/Chest: Effort normal and breath sounds normal. She exhibits no tenderness.  Abdominal: Soft. Normal appearance and bowel sounds are normal. She exhibits no distension, no ascites and no pulsatile midline mass. There is tenderness in the left upper quadrant. There is no rigidity, no rebound, no guarding and no CVA tenderness.  Musculoskeletal: She exhibits no edema.  Lymphadenopathy:    She has no cervical adenopathy.  Neurological: She is alert.  Skin: Skin is warm and dry. No rash noted. She is not diaphoretic.  Psychiatric: She has a normal mood and affect.  Nursing note and vitals reviewed.    ED Treatments / Results  Labs (all labs ordered are listed, but only abnormal results are displayed) Labs Reviewed  COMPREHENSIVE METABOLIC PANEL - Abnormal; Notable for the following components:      Result Value   Sodium 131 (*)    CO2 20 (*)    Glucose, Bld 363 (*)    Calcium 8.7 (*)    All other components within normal limits  URINALYSIS, ROUTINE W REFLEX MICROSCOPIC - Abnormal; Notable for the following components:   Glucose, UA >=500 (*)    All other components within normal limits  LIPASE, BLOOD  CBC  URINALYSIS, MICROSCOPIC (REFLEX)  I-STAT BETA HCG BLOOD, ED ([facility], [facility], [facility] ONLY)    EKG  EKG  Interpretation None       Radiology No results found.  Procedures Procedures (including critical care time)  Medications Ordered in ED Medications  gi cocktail (Maalox,Lidocaine,Donnatal) (30 mLs Oral Given 07/08/17 1857)  sodium chloride 0.9 % bolus 1,000 mL (0 mLs Intravenous Stopped 07/08/17 2101)  ondansetron (ZOFRAN) injection 4 mg (4 mg Intravenous Given 07/08/17 1855)  fentaNYL (SUBLIMAZE) injection 50 mcg (50 mcg Intravenous Given 07/08/17 2107)     Initial Impression / Assessment and Plan / ED Course  I have reviewed the triage vital signs and the nursing notes.  Pertinent labs & imaging results that were available during my care of the patient were reviewed by me and considered in my medical decision making (see chart for details).     36 year old female presenting with 2 weeks of left upper quadrant abdominal pain with associated nausea.  She was seen on 06/30/2017 for the same she had reassuring labs.  She was discharged home on PPI and Zofran.  She was told to follow with GI but has not done this.  She is also been told to have a gastric emptying study done by her PCP but has not scheduled this.  At time of today's exam the patient appears in no acute distress.  Vitals are reassuring without any fever, tachycardia, tachypnea, hypotension or hypoxia.  She is non-ill and nonseptic appearing.  Abdominal exam is with left upper quadrant tenderness palpation without any peritoneal signs.  There is no CVA tenderness.  Patient denies any urinary symptoms.  Labs done in triage show UA without evidence of infection.  CBC without leukocytosis.  No anemia.  There is mild hyponatremia.  Potassium within normal limits.  Glucose is elevated but no anion gap acidosis.  No evidence of DKA.  Kidney function within normal limits.  Lipase within normal limits.  Pregnancy test negative.  Suspect this is likely due to gastritis vs PUD given reassuring labs and exam.  Patient has stopped taking  her PPI medication though  she was prescribed.  She was given a GI cocktail and IV fluids in the emergency department with relief of her symptoms.  Repeat abdominal exam without any peritoneal signs.  I will prescribe the patient Carafate and have her continue taking PPI on an outpatient basis.  Stressed the importance of follow-up with gastroenterology.  Patient states understanding.  Return precautions discussed.  She is in agreement with plan, tolerating p.o. intake, hemodynamically stable and appears safe for discharge.  Final Clinical Impressions(s) / ED Diagnoses   Final diagnoses:  Left upper quadrant pain    ED Discharge Orders        Ordered    sucralfate (CARAFATE) 1 GM/10ML suspension  3 times daily with meals & bedtime     07/08/17 2037       Jacinto HalimMaczis, Jordain Radin M, PA-C 07/09/17 [id number]    Rolan BuccoBelfi, Melanie, MD 07/09/17 2012

## 2017-07-08 NOTE — ED Triage Notes (Signed)
Pt reports being seen at [facility] last week for left side abd pain and was prescribed meds for her stomach. Pt reports increase in abd pain last night and radiates around her side. Has n/v/d. Denies urinary symptoms.

## 2017-07-12 ENCOUNTER — Encounter: Payer: Self-pay | Admitting: Gastroenterology

## 2017-07-24 ENCOUNTER — Encounter (HOSPITAL_COMMUNITY): Payer: Self-pay | Admitting: Emergency Medicine

## 2017-07-24 ENCOUNTER — Emergency Department (HOSPITAL_COMMUNITY)
Admission: EM | Admit: 2017-07-24 | Discharge: 2017-07-25 | Disposition: A | Discharge status: Home / Self Care | Payer: Medicaid Other | Attending: Emergency Medicine | Admitting: Emergency Medicine

## 2017-07-24 ENCOUNTER — Other Ambulatory Visit: Payer: Self-pay

## 2017-07-24 DIAGNOSIS — Z91040 Latex allergy status: Secondary | ICD-10-CM | POA: Insufficient documentation

## 2017-07-24 DIAGNOSIS — E1143 Type 2 diabetes mellitus with diabetic autonomic (poly)neuropathy: Secondary | ICD-10-CM | POA: Insufficient documentation

## 2017-07-24 DIAGNOSIS — K3184 Gastroparesis: Secondary | ICD-10-CM | POA: Diagnosis not present

## 2017-07-24 DIAGNOSIS — Z882 Allergy status to sulfonamides status: Secondary | ICD-10-CM | POA: Diagnosis not present

## 2017-07-24 DIAGNOSIS — R1012 Left upper quadrant pain: Secondary | ICD-10-CM | POA: Diagnosis not present

## 2017-07-24 DIAGNOSIS — Z885 Allergy status to narcotic agent status: Secondary | ICD-10-CM | POA: Diagnosis not present

## 2017-07-24 LAB — BASIC METABOLIC PANEL
Anion gap: 10 (ref 5–15)
BUN: 14 mg/dL (ref 6–20)
CALCIUM: 9.1 mg/dL (ref 8.9–10.3)
CO2: 23 mmol/L (ref 22–32)
CREATININE: 0.69 mg/dL (ref 0.44–1.00)
Chloride: 101 mmol/L (ref 101–111)
GFR calc non Af Amer: >60 mL/min (ref >60)
Glucose, Bld: 302 mg/dL — ABNORMAL HIGH (ref 65–99)
Potassium: 4.1 mmol/L (ref 3.5–5.1)
SODIUM: 134 mmol/L — AB (ref 135–145)

## 2017-07-24 LAB — CBC WITH DIFFERENTIAL/PLATELET
BASOS PCT: 0 %
Basophils Absolute: 0 10*3/uL (ref 0.0–0.1)
EOS ABS: 0.1 10*3/uL (ref 0.0–0.7)
Eosinophils Relative: 2 %
HEMATOCRIT: 42.9 % (ref 36.0–46.0)
Hemoglobin: 14 g/dL (ref 12.0–15.0)
Lymphocytes Relative: 32 %
Lymphs Abs: 2 10*3/uL (ref 0.7–4.0)
MCH: 28.5 pg (ref 26.0–34.0)
MCHC: 32.6 g/dL (ref 30.0–36.0)
MCV: 87.2 fL (ref 78.0–100.0)
MONO ABS: 0.3 10*3/uL (ref 0.1–1.0)
MONOS PCT: 5 %
Neutro Abs: 3.9 10*3/uL (ref 1.7–7.7)
Neutrophils Relative %: 61 %
Platelets: 206 10*3/uL (ref 150–400)
RBC: 4.92 MIL/uL (ref 3.87–5.11)
RDW: 13.6 % (ref 11.5–15.5)
WBC: 6.4 10*3/uL (ref 4.0–10.5)

## 2017-07-24 LAB — URINALYSIS, ROUTINE W REFLEX MICROSCOPIC
Bilirubin Urine: NEGATIVE
Glucose, UA: >=500 mg/dL — AB
HGB URINE DIPSTICK: NEGATIVE
Ketones, ur: NEGATIVE mg/dL
Leukocytes, UA: NEGATIVE
NITRITE: NEGATIVE
PROTEIN: NEGATIVE mg/dL
SPECIFIC GRAVITY, URINE: 1.029 (ref 1.005–1.030)
pH: 5 (ref 5.0–8.0)

## 2017-07-24 LAB — CBG MONITORING, ED: GLUCOSE-CAPILLARY: 312 mg/dL — AB (ref 65–99)

## 2017-07-24 MED ORDER — ONDANSETRON HCL 4 MG/2ML IJ SOLN
4.0000 mg | Freq: Once | INTRAMUSCULAR | Status: AC
  Administered 2017-07-24: 4 mg via INTRAVENOUS
  Filled 2017-07-24: qty 2

## 2017-07-24 MED ORDER — PANTOPRAZOLE SODIUM 40 MG PO TBEC
40.0000 mg | DELAYED_RELEASE_TABLET | Freq: Once | ORAL | Status: AC
Start: 2017-07-24 — End: 2017-07-24
  Administered 2017-07-24: 40 mg via ORAL
  Filled 2017-07-24: qty 1

## 2017-07-24 MED ORDER — FAMOTIDINE 20 MG PO TABS
20.0000 mg | ORAL_TABLET | Freq: Once | ORAL | Status: AC
  Administered 2017-07-24: 20 mg via ORAL
  Filled 2017-07-24: qty 1

## 2017-07-24 MED ORDER — FENTANYL CITRATE (PF) 100 MCG/2ML IJ SOLN
50.0000 ug | Freq: Once | INTRAMUSCULAR | Status: AC
  Administered 2017-07-24: 50 ug via INTRAVENOUS
  Filled 2017-07-24: qty 2

## 2017-07-24 MED ORDER — FENTANYL CITRATE (PF) 100 MCG/2ML IJ SOLN: 50.0000 ug | Freq: Once | INTRAMUSCULAR | Status: DC

## 2017-07-24 NOTE — ED Triage Notes (Signed)
Pt c/o left abd/flank into back pain. States has been seen in ED for this and has GI apt 2/14. Pain started again last night. Denies v/d. lnbm today

## 2017-07-24 NOTE — ED Provider Notes (Signed)
[facility]ERGENCY DEPARTMENT Provider Note   CSN: [id number] Arrival date & time: 07/24/17  1803     History   Chief Complaint Chief Complaint  Patient presents with  . Abdominal Pain    HPI Brittany [name] is a 37 y.o. female.  Patient is a 37 year old female who presents to the emergency department with a complaint of abdominal pain.  Patient has a past medical history of diabetes mellitus, gastroparesis, left upper quadrant abdominal pain, and morbid obesity.  The patient has been seen for this problem in the emergency department in the past.  She has been referred to gastroenterology.  She states however that she now has an appointment with gastroenterology on February 14.  She states that approximately 2 AM this morning she developed a very sharp pain in her left upper abdomen and back.  She says it feels as though someone stabbed her with a knife in the back on the left and that the tip felt sharp and hot while the other portion felt dull and cold near her back.  She says the pain became constant and intense.  It had every 2 years.  She could not come to the emergency department at that time because her husband had to be at work and so she tried to deal with the pain during the day.  When he returned from work she presents now to the emergency department.  Patient states she took several ibuprofen tablets to assist with her pain. she states that it would calm down for a while and then flared back up.  She feels as though the pain might be getting worse instead of better.  No fever reported.  No changes in stool.  No blood in the stool.  She presents now for assistance with this issue.      Past Medical History:  Diagnosis Date  . Anxiety   . Carpal tunnel syndrome on left 10/12/2016  . Cellulitis and abscess of leg   . Depression   . Diabetes mellitus without complication (HCC)   . Gallstones   . Lymphedema   . Sleep apnea    DX with sleep apnea - unable to use c-pap  .  Umbilical hernia     Patient Active Problem List   Diagnosis Date Noted  . Encounter for IUD removal 04/20/2017  . Pressure injury of skin 10/24/2016  . Cellulitis and abscess of leg 10/23/2016  . Sleep apnea 10/23/2016  . Diabetes mellitus without complication (HCC) 10/23/2016  . Morbid obesity with body mass index (BMI) greater than or equal to 70 in adult [facility](HCC) 10/23/2016  . Cellulitis 10/23/2016  . Carpal tunnel syndrome on left 10/12/2016  . Candidal intertrigo 12/31/2014  . Morbid obesity (HCC) 12/29/2014  . Hyponatremia 12/29/2014  . Cellulitis of left lower extremity 12/29/2014  . DM (diabetes mellitus), type 2, uncontrolled (HCC) 01/04/2013    Past Surgical History:  Procedure Laterality Date  . CESAREAN SECTION  11/09/01  . INSERTION OF MESH N/A 01/02/2013   Procedure: INSERTION OF MESH;  Surgeon: Lodema PilotBrian Layton, DO;  Location: [facility];  Service: General;  Laterality: N/A;  . UMBILICAL HERNIA REPAIR N/A 01/02/2013   Procedure: LAPAROSCOPIC UMBILICAL HERNIA;  Surgeon: Lodema PilotBrian Layton, DO;  Location: [facility];  Service: General;  Laterality: N/A;    OB History    Gravida Para Term Preterm AB Living   3 1 1   2 1    SAB TAB Ectopic Multiple Live Births   2  1       Home Medications    Prior to Admission medications   Medication Sig Start Date End Date Taking? Authorizing Provider  ibuprofen (ADVIL,MOTRIN) 200 MG tablet Take 600-800 mg by mouth every 6 (six) hours as needed for mild pain or moderate pain.    Yes [provider]  insulin aspart (NOVOLOG) 100 UNIT/ML injection SLIDING SCALE 3 TIMES DAILY WITH MEALS: For blood sugar 70-120-no insulin. Blood sugar 121-150 take 5 units. Blood sugar 151-208 take 8 units. Blood sugar 201-250 take 12 units. Blood sugar 251-300 take 17 units. Blood sugar 301-350 take 20 units. Blood sugar 351-400 take 25 units. Blood sugar greater than 400 take 28 units and call your doctor. 03/27/16  Yes Devoria Albe, MD  insulin glargine  (LANTUS) 100 UNIT/ML injection Inject 0.55 mLs (55 Units total) into the skin at bedtime. 10/26/16  Yes Henderson Cloud, MD    Family History Family History  Problem Relation Age of Onset  . Cancer Mother        leukemia  . Diabetes Mother   . Hypertension Mother   . Hyperlipidemia Mother   . Other Father        Sciatica  . Alcohol abuse Paternal Grandfather   . Other Paternal Grandmother        bowel obstruction; pneumonia  . Cancer Maternal Grandfather        lung  . ADD / ADHD Daughter   . Depression Daughter   . Anxiety disorder Daughter     Social History Social History   Tobacco Use  . Smoking status: Never Smoker  . Smokeless tobacco: Never Used  Substance Use Topics  . Alcohol use: No  . Drug use: No     Allergies   Morphine and related; Latex; and Sulfa antibiotics   Review of Systems Review of Systems  Constitutional: Negative for activity change.       All ROS Neg except as noted in HPI  HENT: Negative for nosebleeds.   Eyes: Negative for photophobia and discharge.  Respiratory: Negative for cough, shortness of breath and wheezing.   Cardiovascular: Negative for chest pain and palpitations.  Gastrointestinal: Positive for abdominal pain. Negative for blood in stool.  Genitourinary: Negative for dysuria, frequency and hematuria.  Musculoskeletal: Negative for arthralgias, back pain and neck pain.  Skin: Negative.   Neurological: Negative for dizziness, seizures and speech difficulty.  Psychiatric/Behavioral: Negative for confusion and hallucinations.     Physical Exam Updated Vital Signs BP 136/71 (BP Location: Right Arm)   Pulse 80   Temp 97.7 F (36.5 C) (Oral)   Resp 18   SpO2 100%   Physical Exam  Constitutional: She is oriented to person, place, and time. She appears well-developed and well-nourished.  Non-toxic appearance.  Patient is morbidly obese  HENT:  Head: Normocephalic.  Right Ear: Tympanic membrane and external  ear normal.  Left Ear: Tympanic membrane and external ear normal.  Eyes: EOM and lids are normal. Pupils are equal, round, and reactive to light.  Neck: Normal range of motion. Neck supple. Carotid bruit is not present.  Cardiovascular: Normal rate, regular rhythm, normal heart sounds, intact distal pulses and normal pulses.  Pulmonary/Chest: Breath sounds normal. No respiratory distress.  Abdominal: Soft. Bowel sounds are normal. There is tenderness in the left upper quadrant. There is no guarding.  There is pain to palpation in the left upper quadrant extending to the flank area.  No hot areas appreciated.  No mass appreciated.  No pulsating mass noted.  No rigidity appreciated.  Musculoskeletal: Normal range of motion.  Lymphadenopathy:       Head (right side): No submandibular adenopathy present.       Head (left side): No submandibular adenopathy present.    She has no cervical adenopathy.  Neurological: She is alert and oriented to person, place, and time. She has normal strength. No cranial nerve deficit or sensory deficit.  Skin: Skin is warm and dry.  Psychiatric: She has a normal mood and affect. Her speech is normal.  Nursing note and vitals reviewed.    ED Treatments / Results  Labs (all labs ordered are listed, but only abnormal results are displayed) Labs Reviewed  BASIC METABOLIC PANEL - Abnormal; Notable for the following components:      Result Value   Sodium 134 (*)    Glucose, Bld 302 (*)    All other components within normal limits  URINALYSIS, ROUTINE W REFLEX MICROSCOPIC - Abnormal; Notable for the following components:   APPearance HAZY (*)    Glucose, UA >=500 (*)    Bacteria, UA RARE (*)    Squamous Epithelial / LPF 6-30 (*)    All other components within normal limits  CBG MONITORING, ED - Abnormal; Notable for the following components:   Glucose-Capillary 312 (*)    All other components within normal limits  CBC WITH DIFFERENTIAL/PLATELET    EKG   EKG Interpretation None       Radiology No results found.  Procedures Procedures (including critical care time)  Medications Ordered in ED Medications - No data to display   Initial Impression / Assessment and Plan / ED Course  I have reviewed the triage vital signs and the nursing notes.  Pertinent labs & imaging results that were available during my care of the patient were reviewed by me and considered in my medical decision making (see chart for details).       Final Clinical Impressions(s) / ED Diagnoses MDM Blood pressure is elevated at 157/104 on this visit.  Pulse oximetry is 100% on room air.  Within normal limits by my interpretation.  I have reviewed previous emergency department visits.  Urine analysis shows the glucose to be elevated greater than 500.  There are rare bacteria present otherwise the urine is normal. Complete blood count is well within normal limits.  Basic metabolic panel shows the glucose to be elevated at 302, otherwise within normal limits.  Patient was treated for her pain.  Pain improved after intravenous pain medication.  I have instructed the patient to see the GI specialist as soon as possible. Prescription for Zofran and Protonix given to the patient for possible gastritis related pain.  Patient will use Zofran for nausea. Pt will use Ultram for pain if needed. Pt to return to the ED if any acute changes.   Final diagnoses:  Left upper quadrant pain    ED Discharge Orders    None       Ivery Quale, PA-C 07/25/17 0147    Rolland Porter, MD 07/29/17 [phone]

## 2017-07-25 MED ORDER — RANITIDINE HCL 150 MG PO TABS: 150.0000 mg | ORAL_TABLET | Freq: Two times a day (BID) | ORAL | 0 refills | Status: DC

## 2017-07-25 MED ORDER — ONDANSETRON HCL 4 MG PO TABS: 4.0000 mg | ORAL_TABLET | Freq: Four times a day (QID) | ORAL | 0 refills | Status: DC

## 2017-07-25 MED ORDER — PANTOPRAZOLE SODIUM 20 MG PO TBEC: 20.0000 mg | DELAYED_RELEASE_TABLET | Freq: Every day | ORAL | 0 refills | Status: DC

## 2017-07-25 MED ORDER — TRAMADOL HCL 50 MG PO TABS: ORAL_TABLET | ORAL | 0 refills | Status: DC

## 2017-07-25 NOTE — Discharge Instructions (Signed)
Your vital signs within normal limits.  Your labs are nonacute.  Please see the GI specialist as planned.  Use Protonix and Zantac daily.  Use Zofran every 6 hours if needed for nausea.  May use Ultram 1 or 2 tablets every 6 hours if needed for pain. This medication may cause drowsiness. Please do not drink, drive, or participate in activity that requires concentration while taking this medication.

## 2017-08-10 ENCOUNTER — Emergency Department (HOSPITAL_COMMUNITY): Payer: Medicaid Other

## 2017-08-10 ENCOUNTER — Inpatient Hospital Stay (HOSPITAL_COMMUNITY)
Admission: EM | Admit: 2017-08-10 | Discharge: 2017-08-13 | DRG: 872 | Disposition: A | Discharge status: Home / Self Care | Payer: Medicaid Other | Attending: Internal Medicine | Admitting: Internal Medicine

## 2017-08-10 ENCOUNTER — Encounter (HOSPITAL_COMMUNITY): Payer: Self-pay | Admitting: Emergency Medicine

## 2017-08-10 ENCOUNTER — Other Ambulatory Visit: Payer: Self-pay

## 2017-08-10 DIAGNOSIS — Z885 Allergy status to narcotic agent status: Secondary | ICD-10-CM

## 2017-08-10 DIAGNOSIS — Z6841 Body Mass Index (BMI) 40.0 and over, adult: Secondary | ICD-10-CM

## 2017-08-10 DIAGNOSIS — R112 Nausea with vomiting, unspecified: Secondary | ICD-10-CM | POA: Diagnosis present

## 2017-08-10 DIAGNOSIS — Z91040 Latex allergy status: Secondary | ICD-10-CM

## 2017-08-10 DIAGNOSIS — I10 Essential (primary) hypertension: Secondary | ICD-10-CM | POA: Diagnosis present

## 2017-08-10 DIAGNOSIS — G4730 Sleep apnea, unspecified: Secondary | ICD-10-CM | POA: Diagnosis present

## 2017-08-10 DIAGNOSIS — G8929 Other chronic pain: Secondary | ICD-10-CM | POA: Diagnosis present

## 2017-08-10 DIAGNOSIS — Z8349 Family history of other endocrine, nutritional and metabolic diseases: Secondary | ICD-10-CM

## 2017-08-10 DIAGNOSIS — Z833 Family history of diabetes mellitus: Secondary | ICD-10-CM

## 2017-08-10 DIAGNOSIS — F419 Anxiety disorder, unspecified: Secondary | ICD-10-CM | POA: Diagnosis present

## 2017-08-10 DIAGNOSIS — F329 Major depressive disorder, single episode, unspecified: Secondary | ICD-10-CM | POA: Diagnosis present

## 2017-08-10 DIAGNOSIS — R109 Unspecified abdominal pain: Secondary | ICD-10-CM

## 2017-08-10 DIAGNOSIS — R161 Splenomegaly, not elsewhere classified: Secondary | ICD-10-CM

## 2017-08-10 DIAGNOSIS — E11628 Type 2 diabetes mellitus with other skin complications: Secondary | ICD-10-CM | POA: Diagnosis present

## 2017-08-10 DIAGNOSIS — L03116 Cellulitis of left lower limb: Secondary | ICD-10-CM | POA: Diagnosis present

## 2017-08-10 DIAGNOSIS — E876 Hypokalemia: Secondary | ICD-10-CM | POA: Diagnosis present

## 2017-08-10 DIAGNOSIS — R509 Fever, unspecified: Secondary | ICD-10-CM

## 2017-08-10 DIAGNOSIS — R590 Localized enlarged lymph nodes: Secondary | ICD-10-CM | POA: Diagnosis present

## 2017-08-10 DIAGNOSIS — K219 Gastro-esophageal reflux disease without esophagitis: Secondary | ICD-10-CM | POA: Diagnosis present

## 2017-08-10 DIAGNOSIS — R1032 Left lower quadrant pain: Secondary | ICD-10-CM | POA: Diagnosis present

## 2017-08-10 DIAGNOSIS — Z8249 Family history of ischemic heart disease and other diseases of the circulatory system: Secondary | ICD-10-CM

## 2017-08-10 DIAGNOSIS — G4733 Obstructive sleep apnea (adult) (pediatric): Secondary | ICD-10-CM | POA: Diagnosis present

## 2017-08-10 DIAGNOSIS — E1165 Type 2 diabetes mellitus with hyperglycemia: Secondary | ICD-10-CM | POA: Diagnosis present

## 2017-08-10 DIAGNOSIS — A419 Sepsis, unspecified organism: Principal | ICD-10-CM | POA: Diagnosis present

## 2017-08-10 DIAGNOSIS — Z79899 Other long term (current) drug therapy: Secondary | ICD-10-CM

## 2017-08-10 DIAGNOSIS — Z881 Allergy status to other antibiotic agents status: Secondary | ICD-10-CM

## 2017-08-10 DIAGNOSIS — Z794 Long term (current) use of insulin: Secondary | ICD-10-CM

## 2017-08-10 DIAGNOSIS — Z79891 Long term (current) use of opiate analgesic: Secondary | ICD-10-CM

## 2017-08-10 DIAGNOSIS — Z818 Family history of other mental and behavioral disorders: Secondary | ICD-10-CM

## 2017-08-10 DIAGNOSIS — R162 Hepatomegaly with splenomegaly, not elsewhere classified: Secondary | ICD-10-CM | POA: Diagnosis present

## 2017-08-10 HISTORY — DX: Unspecified abdominal pain: R10.9

## 2017-08-10 HISTORY — DX: Gastro-esophageal reflux disease without esophagitis: K21.9

## 2017-08-10 HISTORY — DX: Other chronic pain: G89.29

## 2017-08-10 HISTORY — DX: Headache, unspecified: R51.9

## 2017-08-10 HISTORY — DX: Headache: R51

## 2017-08-10 LAB — URINALYSIS, ROUTINE W REFLEX MICROSCOPIC
Bacteria, UA: NONE SEEN
Bilirubin Urine: NEGATIVE
HGB URINE DIPSTICK: NEGATIVE
KETONES UR: NEGATIVE mg/dL
LEUKOCYTES UA: NEGATIVE
NITRITE: NEGATIVE
PROTEIN: 30 mg/dL — AB
Specific Gravity, Urine: 1.02 (ref 1.005–1.030)
pH: 7 (ref 5.0–8.0)

## 2017-08-10 LAB — COMPREHENSIVE METABOLIC PANEL
ALK PHOS: 80 U/L (ref 38–126)
ALT: 27 U/L (ref 14–54)
ANION GAP: 11 (ref 5–15)
AST: 23 U/L (ref 15–41)
Albumin: 3.4 g/dL — ABNORMAL LOW (ref 3.5–5.0)
BUN: 8 mg/dL (ref 6–20)
CALCIUM: 8.8 mg/dL — AB (ref 8.9–10.3)
CO2: 22 mmol/L (ref 22–32)
CREATININE: 0.83 mg/dL (ref 0.44–1.00)
Chloride: 101 mmol/L (ref 101–111)
Glucose, Bld: 316 mg/dL — ABNORMAL HIGH (ref 65–99)
Potassium: 3.6 mmol/L (ref 3.5–5.1)
Sodium: 134 mmol/L — ABNORMAL LOW (ref 135–145)
Total Bilirubin: 1.1 mg/dL (ref 0.3–1.2)
Total Protein: 7.5 g/dL (ref 6.5–8.1)

## 2017-08-10 LAB — LACTIC ACID, PLASMA: LACTIC ACID, VENOUS: 2.3 mmol/L — AB (ref 0.5–1.9)

## 2017-08-10 LAB — CBC WITH DIFFERENTIAL/PLATELET
BASOS PCT: 0 %
Basophils Absolute: 0 10*3/uL (ref 0.0–0.1)
EOS ABS: 0 10*3/uL (ref 0.0–0.7)
Eosinophils Relative: 0 %
HCT: 41.9 % (ref 36.0–46.0)
HEMOGLOBIN: 13.5 g/dL (ref 12.0–15.0)
Lymphocytes Relative: 5 %
Lymphs Abs: 0.7 10*3/uL (ref 0.7–4.0)
MCH: 28.3 pg (ref 26.0–34.0)
MCHC: 32.2 g/dL (ref 30.0–36.0)
MCV: 87.8 fL (ref 78.0–100.0)
Monocytes Absolute: 0.3 10*3/uL (ref 0.1–1.0)
Monocytes Relative: 2 %
NEUTROS PCT: 93 %
Neutro Abs: 13.7 10*3/uL — ABNORMAL HIGH (ref 1.7–7.7)
Platelets: 185 10*3/uL (ref 150–400)
RBC: 4.77 MIL/uL (ref 3.87–5.11)
RDW: 13.5 % (ref 11.5–15.5)
WBC: 14.6 10*3/uL — AB (ref 4.0–10.5)

## 2017-08-10 LAB — I-STAT BETA HCG BLOOD, ED (MC, WL, AP ONLY): I-stat hCG, quantitative: 6 m[IU]/mL — ABNORMAL HIGH (ref <5)

## 2017-08-10 LAB — CBG MONITORING, ED: GLUCOSE-CAPILLARY: 301 mg/dL — AB (ref 65–99)

## 2017-08-10 LAB — INFLUENZA PANEL BY PCR (TYPE A & B)
INFLAPCR: NEGATIVE
Influenza B By PCR: NEGATIVE

## 2017-08-10 LAB — PREGNANCY, URINE: PREG TEST UR: NEGATIVE

## 2017-08-10 LAB — LIPASE, BLOOD: Lipase: 18 U/L (ref 11–51)

## 2017-08-10 MED ORDER — SODIUM CHLORIDE 0.9 % IV BOLUS (SEPSIS): 1000.0000 mL | Freq: Once | INTRAVENOUS | Status: DC

## 2017-08-10 MED ORDER — ACETAMINOPHEN 325 MG PO TABS
650.0000 mg | ORAL_TABLET | Freq: Once | ORAL | Status: AC | PRN
  Administered 2017-08-10: 650 mg via ORAL
  Filled 2017-08-10: qty 2

## 2017-08-10 MED ORDER — SODIUM CHLORIDE 0.9 % IV BOLUS (SEPSIS)
1000.0000 mL | Freq: Once | INTRAVENOUS | Status: AC
  Administered 2017-08-10: 1000 mL via INTRAVENOUS

## 2017-08-10 MED ORDER — IOPAMIDOL (ISOVUE-300) INJECTION 61%
INTRAVENOUS | Status: AC
  Administered 2017-08-10: 100 mL
  Filled 2017-08-10: qty 100

## 2017-08-10 MED ORDER — FAMOTIDINE IN NACL 20-0.9 MG/50ML-% IV SOLN
20.0000 mg | Freq: Once | INTRAVENOUS | Status: AC
  Administered 2017-08-10: 20 mg via INTRAVENOUS
  Filled 2017-08-10: qty 50

## 2017-08-10 NOTE — ED Provider Notes (Signed)
[facility] EMERGENCY DEPARTMENT Provider Note   CSN: [id number] Arrival date & time: 08/10/17  1829     History   Chief Complaint Chief Complaint  Patient presents with  . Abdominal Pain    HPI Brittany [name] is a 37 y.o. female.  HPI  Pt was seen at 1925.  Per pt, c/o gradual onset and persistence of constant acute flair of her chronic left sided abd "pain" for the past 3 months, worse since this morning.  Has been associated with several episodes of N/V and home fevers today.   Describes the abd pain as "stabbing."  Denies diarrhea, no back pain, no rash, no CP/SOB, no black or blood in stools or emesis.      Past Medical History:  Diagnosis Date  . Anxiety   . Carpal tunnel syndrome on left 10/12/2016  . Cellulitis and abscess of leg   . Chronic abdominal pain   . Depression   . Diabetes mellitus without complication (HCC)   . Gallstones   . Lymphedema   . Sleep apnea    DX with sleep apnea - unable to use c-pap  . Umbilical hernia     Patient Active Problem List   Diagnosis Date Noted  . Encounter for IUD removal 04/20/2017  . Pressure injury of skin 10/24/2016  . Cellulitis and abscess of leg 10/23/2016  . Sleep apnea 10/23/2016  . Diabetes mellitus without complication (HCC) 10/23/2016  . Morbid obesity with body mass index (BMI) greater than or equal to 70 in adult [facility]) 10/23/2016  . Cellulitis 10/23/2016  . Carpal tunnel syndrome on left 10/12/2016  . Candidal intertrigo 12/31/2014  . Morbid obesity (HCC) 12/29/2014  . Hyponatremia 12/29/2014  . Cellulitis of left lower extremity 12/29/2014  . DM (diabetes mellitus), type 2, uncontrolled (HCC) 01/04/2013    Past Surgical History:  Procedure Laterality Date  . CESAREAN SECTION  11/09/01  . INSERTION OF MESH N/A 01/02/2013   Procedure: INSERTION OF MESH;  Surgeon: Lodema Pilot, DO;  Location: [facility] ORS;  Service: General;  Laterality: N/A;  . UMBILICAL HERNIA REPAIR N/A 01/02/2013   Procedure:  LAPAROSCOPIC UMBILICAL HERNIA;  Surgeon: Lodema Pilot, DO;  Location: [facility] ORS;  Service: General;  Laterality: N/A;    OB History    Gravida Para Term Preterm AB Living   3 1 1   2 1    SAB TAB Ectopic Multiple Live Births   2       1       Home Medications    Prior to Admission medications   Medication Sig Start Date End Date Taking? Authorizing Provider  ibuprofen (ADVIL,MOTRIN) 200 MG tablet Take 600-800 mg by mouth every 6 (six) hours as needed for mild pain or moderate pain.    Yes [provider]  insulin aspart (NOVOLOG) 100 UNIT/ML injection SLIDING SCALE 3 TIMES DAILY WITH MEALS: For blood sugar 70-120-no insulin. Blood sugar 121-150 take 5 units. Blood sugar 151-208 take 8 units. Blood sugar 201-250 take 12 units. Blood sugar 251-300 take 17 units. Blood sugar 301-350 take 20 units. Blood sugar 351-400 take 25 units. Blood sugar greater than 400 take 28 units and call your doctor. 03/27/16  Yes Devoria Albe, MD  insulin glargine (LANTUS) 100 UNIT/ML injection Inject 0.55 mLs (55 Units total) into the skin at bedtime. 10/26/16  Yes Philip Aspen, Limmie Patricia, MD  ondansetron (ZOFRAN) 4 MG tablet Take 1 tablet (4 mg total) by mouth every 6 (six) hours. Patient  not taking: Reported on 08/10/2017 07/25/17   Ivery Quale, PA-C  pantoprazole (PROTONIX) 20 MG tablet Take 1 tablet (20 mg total) by mouth daily. Patient not taking: Reported on 08/10/2017 07/25/17   Ivery Quale, PA-C  ranitidine (ZANTAC) 150 MG tablet Take 1 tablet (150 mg total) by mouth 2 (two) times daily. Patient not taking: Reported on 08/10/2017 07/25/17   Ivery Quale, PA-C  traMADol Janean Sark) 50 MG tablet 1 or 2 po q6h prn pain Patient not taking: Reported on 08/10/2017 07/25/17   Ivery Quale, PA-C    Family History Family History  Problem Relation Age of Onset  . Cancer Mother        leukemia  . Diabetes Mother   . Hypertension Mother   . Hyperlipidemia Mother   . Other Father        Sciatica  .  Alcohol abuse Paternal Grandfather   . Other Paternal Grandmother        bowel obstruction; pneumonia  . Cancer Maternal Grandfather        lung  . ADD / ADHD Daughter   . Depression Daughter   . Anxiety disorder Daughter     Social History Social History   Tobacco Use  . Smoking status: Never Smoker  . Smokeless tobacco: Never Used  Substance Use Topics  . Alcohol use: No  . Drug use: No     Allergies   Morphine and related; Latex; and Sulfa antibiotics   Review of Systems Review of Systems ROS: Statement: All systems negative except as marked or noted in the HPI; Constitutional: +fever and chills. ; ; Eyes: Negative for eye pain, redness and discharge. ; ; ENMT: Negative for ear pain, hoarseness, nasal congestion, sinus pressure and sore throat. ; ; Cardiovascular: Negative for chest pain, palpitations, diaphoresis, dyspnea and peripheral edema. ; ; Respiratory: Negative for cough, wheezing and stridor. ; ; Gastrointestinal: +N/V, abd pain. Negative for diarrhea, blood in stool, hematemesis, jaundice and rectal bleeding. . ; ; Genitourinary: Negative for dysuria, flank pain and hematuria. ; ; Musculoskeletal: Negative for back pain and neck pain. Negative for swelling and trauma.; ; Skin: Negative for pruritus, abrasions, blisters, bruising and skin lesion.; ; Neuro: Negative for headache, lightheadedness and neck stiffness. Negative for weakness, altered level of consciousness, altered mental status, extremity weakness, paresthesias, involuntary movement, seizure and syncope.       Physical Exam Updated Vital Signs BP 108/62 (BP Location: Left Wrist)   Pulse (!) 114   Temp (!) 101 F (38.3 C) (Oral)   Resp 17   Wt (!) 242.7 kg (535 lb)   SpO2 98%   BMI 76.76 kg/m   Patient Vitals for the past 24 hrs:  BP Temp Temp src Pulse Resp SpO2 Weight  08/11/17 0008 126/79 (!) 102.9 F (39.4 C) Oral (!) 116 - 99 % -  08/10/17 2100 126/66 - - (!) 109 (!) 30 97 % -  08/10/17  1953 108/62 (!) 101 F (38.3 C) Oral (!) 114 17 98 % -  08/10/17 1849 (!) 142/105 (!) 102.5 F (39.2 C) Oral (!) 132 20 100 % (!) 242.7 kg (535 lb)      Physical Exam 1930: Physical examination:  Nursing notes reviewed; Vital signs and O2 SAT reviewed;  Constitutional: Well developed, Well nourished, Well hydrated, In no acute distress; Head:  Normocephalic, atraumatic; Eyes: EOMI, PERRL, No scleral icterus; ENMT: Mouth and pharynx normal, Mucous membranes moist; Neck: Supple, Full range of motion, No lymphadenopathy; Cardiovascular: Regular  rate and rhythm, No gallop; Respiratory: Breath sounds clear & equal bilaterally, No wheezes.  Speaking full sentences with ease, Normal respiratory effort/excursion; Chest: Nontender, Movement normal; Abdomen: Obese. Soft, +left sided abd tenderness to palp. Nondistended, Normal bowel sounds; Genitourinary: No CVA tenderness; Extremities: Pulses normal, +erythema and warmth to left medial leg, no streaking, no palp abscess. Chronic lymphedema bilat LE's..; Neuro: AA&Ox3, Major CN grossly intact.  Speech clear. No gross focal motor or sensory deficits in extremities.; Skin: Color normal, Warm, Dry.   ED Treatments / Results  Labs (all labs ordered are listed, but only abnormal results are displayed)   EKG  EKG Interpretation None       Radiology   Procedures Procedures (including critical care time)  Medications Ordered in ED Medications  clindamycin (CLEOCIN) IVPB 600 mg (not administered)  sodium chloride 0.9 % bolus 1,000 mL (not administered)  acetaminophen (TYLENOL) tablet 650 mg (650 mg Oral Given 08/10/17 1857)  sodium chloride 0.9 % bolus 1,000 mL (1,000 mLs Intravenous New Bag/Given 08/10/17 1946)  famotidine (PEPCID) IVPB 20 mg premix (0 mg Intravenous Stopped 08/10/17 2155)  iopamidol (ISOVUE-300) 61 % injection (100 mLs  Contrast Given 08/10/17 2245)  acetaminophen (TYLENOL) tablet 1,000 mg (1,000 mg Oral Given 08/11/17 0028)    ibuprofen (ADVIL,MOTRIN) tablet 400 mg (400 mg Oral Given 08/11/17 0028)     Initial Impression / Assessment and Plan / ED Course  I have reviewed the triage vital signs and the nursing notes.  Pertinent labs & imaging results that were available during my care of the patient were reviewed by me and considered in my medical decision making (see chart for details).  MDM Reviewed: previous chart, nursing note and vitals Reviewed previous: labs Interpretation: labs and x-ray   Results for orders placed or performed during the hospital encounter of 08/10/17  Culture, blood (routine x 2)  Result Value Ref Range   Specimen Description RIGHT ANTECUBITAL    Special Requests      BOTTLES DRAWN AEROBIC AND ANAEROBIC Blood Culture adequate volume   Culture PENDING    Report Status PENDING   Culture, blood (routine x 2)  Result Value Ref Range   Specimen Description RIGHT ANTECUBITAL    Special Requests      BOTTLES DRAWN AEROBIC ONLY Blood Culture adequate volume   Culture PENDING    Report Status PENDING   Comprehensive metabolic panel  Result Value Ref Range   Sodium 134 (L) 135 - 145 mmol/L   Potassium 3.6 3.5 - 5.1 mmol/L   Chloride 101 101 - 111 mmol/L   CO2 22 22 - 32 mmol/L   Glucose, Bld 316 (H) 65 - 99 mg/dL   BUN 8 6 - 20 mg/dL   Creatinine, Ser 1.61 0.44 - 1.00 mg/dL   Calcium 8.8 (L) 8.9 - 10.3 mg/dL   Total Protein 7.5 6.5 - 8.1 g/dL   Albumin 3.4 (L) 3.5 - 5.0 g/dL   AST 23 15 - 41 U/L   ALT 27 14 - 54 U/L   Alkaline Phosphatase 80 38 - 126 U/L   Total Bilirubin 1.1 0.3 - 1.2 mg/dL   GFR calc non Af Amer >60 >60 mL/min   GFR calc Af Amer >60 >60 mL/min   Anion gap 11 5 - 15  CBC with Differential  Result Value Ref Range   WBC 14.6 (H) 4.0 - 10.5 K/uL   RBC 4.77 3.87 - 5.11 MIL/uL   Hemoglobin 13.5 12.0 - 15.0 g/dL  HCT 41.9 36.0 - 46.0 %   MCV 87.8 78.0 - 100.0 fL   MCH 28.3 26.0 - 34.0 pg   MCHC 32.2 30.0 - 36.0 g/dL   RDW 16.1 09.6 - 04.5 %   Platelets  185 150 - 400 K/uL   Neutrophils Relative % 93 %   Neutro Abs 13.7 (H) 1.7 - 7.7 K/uL   Lymphocytes Relative 5 %   Lymphs Abs 0.7 0.7 - 4.0 K/uL   Monocytes Relative 2 %   Monocytes Absolute 0.3 0.1 - 1.0 K/uL   Eosinophils Relative 0 %   Eosinophils Absolute 0.0 0.0 - 0.7 K/uL   Basophils Relative 0 %   Basophils Absolute 0.0 0.0 - 0.1 K/uL  Urinalysis, Routine w reflex microscopic  Result Value Ref Range   Color, Urine YELLOW YELLOW   APPearance CLEAR CLEAR   Specific Gravity, Urine 1.020 1.005 - 1.030   pH 7.0 5.0 - 8.0   Glucose, UA >=500 (A) NEGATIVE mg/dL   Hgb urine dipstick NEGATIVE NEGATIVE   Bilirubin Urine NEGATIVE NEGATIVE   Ketones, ur NEGATIVE NEGATIVE mg/dL   Protein, ur 30 (A) NEGATIVE mg/dL   Nitrite NEGATIVE NEGATIVE   Leukocytes, UA NEGATIVE NEGATIVE   RBC / HPF 0-5 0 - 5 RBC/hpf   WBC, UA 0-5 0 - 5 WBC/hpf   Bacteria, UA NONE SEEN NONE SEEN   Squamous Epithelial / LPF 0-5 (A) NONE SEEN   Mucus PRESENT   Lactic acid, plasma  Result Value Ref Range   Lactic Acid, Venous 2.3 (HH) 0.5 - 1.9 mmol/L  Lipase, blood  Result Value Ref Range   Lipase 18 11 - 51 U/L  Influenza panel by PCR (type A & B)  Result Value Ref Range   Influenza A By PCR NEGATIVE NEGATIVE   Influenza B By PCR NEGATIVE NEGATIVE  Pregnancy, urine  Result Value Ref Range   Preg Test, Ur NEGATIVE NEGATIVE  I-Stat beta hCG blood, ED  Result Value Ref Range   I-stat hCG, quantitative 6.0 (H) <5 mIU/mL   Comment 3          CBG monitoring, ED  Result Value Ref Range   Glucose-Capillary 301 (H) 65 - 99 mg/dL  CBG monitoring, ED  Result Value Ref Range   Glucose-Capillary 261 (H) 65 - 99 mg/dL   Dg Chest 2 View Result Date: 08/10/2017 CLINICAL DATA:  Fever and tachycardia. EXAM: CHEST  2 VIEW COMPARISON:  January 06, 2017 FINDINGS: Haziness over the lung bases consistent with overlapping soft tissues. The heart, hila, mediastinum, lungs, and pleura demonstrate no acute abnormalities.  IMPRESSION: No active cardiopulmonary disease. Electronically Signed   By: Gerome Sam III M.D   On: 08/10/2017 20:33   Ct Abdomen Pelvis W Contrast Result Date: 08/10/2017 CLINICAL DATA:  37 y/o  F; abdominal pain, emesis, and fever. EXAM: CT ABDOMEN AND PELVIS WITH CONTRAST TECHNIQUE: Multidetector CT imaging of the abdomen and pelvis was performed using the standard protocol following bolus administration of intravenous contrast. CONTRAST:  ISOVUE-300 IOPAMIDOL (ISOVUE-300) INJECTION 61% COMPARISON:  01/26/2017 CT abdomen and pelvis. FINDINGS: Lower chest: Right lower lobe pulmonary nodule has triangular configuration, likely perifissural nodule, intrapulmonary lymph node. Hepatobiliary: Stable hepatomegaly. No focal liver lesion. No biliary ductal dilatation. Cholelithiasis. Pancreas: Fatty atrophy, otherwise unremarkable. No pancreatic ductal dilatation or surrounding inflammatory changes. Spleen: Splenomegaly 16.1 x 9.8 x 21.3 cm (volume = 1800 cm^3), previously 15.2 x 10.1 x 19.6 (volume = 1580) . Adrenals/Urinary Tract: Adrenal  glands are unremarkable. Kidneys are normal, without renal calculi, focal lesion, or hydronephrosis. Bladder is unremarkable. Stomach/Bowel: Stomach is within normal limits. Appendix appears normal. No evidence of bowel wall thickening, distention, or inflammatory changes. Vascular/Lymphatic: Left external iliac and pelvic sidewall lymph nodes are increased in size: Left external iliac, 19 mm, series 2: Image 95; left pelvic side wall, 16 mm, 3:93. Reproductive: Uterus and bilateral adnexa are unremarkable. IUD is no longer present. Other: No abdominal wall hernia or abnormality. No abdominopelvic ascites. Musculoskeletal: No acute or significant osseous findings. IMPRESSION: 1. No acute process identified. 2. Interval increase in left external iliac and pelvic sidewall lymph node size as well as splenomegaly. Evaluation for systemic infectious, inflammatory, or  lymphoproliferative process is recommended. Electronically Signed   By: Mitzi HansenLance  Furusawa-Stratton M.D.   On: 08/10/2017 23:04     2200:  APAP given for fever with slow improvement. CBG elevated per hx DM, AG normal; IVF given with improvement. BC and UC obtained. Pt with known chronic abd pain, but given fever tonight, pt will need CT scan.  Pt does not fit in [facility] CT scan; [facility]  [facility], [facility] RN aware.   2355:  CT with interval increase in lymph node size since last CT 01/2017.  Pt states she was unaware. T/C to Heme/Onc Dr. FijiPeru, case discussed, including:  HPI, pertinent PM/SHx, VS/PE, dx testing, ED course and treatment:  Non-specific finding, pt can f/u with PMD or Onc Clinic.   [id number]:  Lactic acid slowly trending downward with IVF. Fever now spiked again. Pt has hx sepsis due to LLE cellulitis. Will dose IV clindamycin, observation admit while BC and UC pending. Dx and testing d/w pt and family.  Questions answered.  Verb understanding, agreeable to admit. T/C to [facility] Dr. Sherryll BurgerShah, case discussed, including:  HPI, pertinent PM/SHx, VS/PE, dx testing, ED course and treatment:  Agreeable to admit.        Final Clinical Impressions(s) / ED Diagnoses   Final diagnoses:  None    ED Discharge Orders    None       Samuel JesterMcManus, Swayze Pries, DO 08/12/17 1118

## 2017-08-10 NOTE — ED Notes (Signed)
Date and time results received: 08/10/17  2245 Test: Lactic Acid Critical Value: 2.3  Name of Provider Notified: MD Notified

## 2017-08-10 NOTE — ED Triage Notes (Signed)
Patient reports abdominal pain, fever, and emesis since this am.

## 2017-08-11 ENCOUNTER — Ambulatory Visit: Payer: Medicaid Other | Admitting: Gastroenterology

## 2017-08-11 ENCOUNTER — Other Ambulatory Visit: Payer: Self-pay

## 2017-08-11 ENCOUNTER — Encounter (HOSPITAL_COMMUNITY): Payer: Self-pay

## 2017-08-11 DIAGNOSIS — K219 Gastro-esophageal reflux disease without esophagitis: Secondary | ICD-10-CM | POA: Diagnosis present

## 2017-08-11 DIAGNOSIS — R112 Nausea with vomiting, unspecified: Secondary | ICD-10-CM

## 2017-08-11 DIAGNOSIS — G4733 Obstructive sleep apnea (adult) (pediatric): Secondary | ICD-10-CM | POA: Diagnosis present

## 2017-08-11 DIAGNOSIS — Z8349 Family history of other endocrine, nutritional and metabolic diseases: Secondary | ICD-10-CM | POA: Diagnosis not present

## 2017-08-11 DIAGNOSIS — F419 Anxiety disorder, unspecified: Secondary | ICD-10-CM | POA: Diagnosis present

## 2017-08-11 DIAGNOSIS — A419 Sepsis, unspecified organism: Principal | ICD-10-CM

## 2017-08-11 DIAGNOSIS — R109 Unspecified abdominal pain: Secondary | ICD-10-CM

## 2017-08-11 DIAGNOSIS — R590 Localized enlarged lymph nodes: Secondary | ICD-10-CM | POA: Diagnosis present

## 2017-08-11 DIAGNOSIS — E1165 Type 2 diabetes mellitus with hyperglycemia: Secondary | ICD-10-CM | POA: Diagnosis present

## 2017-08-11 DIAGNOSIS — F329 Major depressive disorder, single episode, unspecified: Secondary | ICD-10-CM | POA: Diagnosis present

## 2017-08-11 DIAGNOSIS — G8929 Other chronic pain: Secondary | ICD-10-CM | POA: Diagnosis present

## 2017-08-11 DIAGNOSIS — Z6841 Body Mass Index (BMI) 40.0 and over, adult: Secondary | ICD-10-CM | POA: Diagnosis not present

## 2017-08-11 DIAGNOSIS — R509 Fever, unspecified: Secondary | ICD-10-CM | POA: Diagnosis not present

## 2017-08-11 DIAGNOSIS — R161 Splenomegaly, not elsewhere classified: Secondary | ICD-10-CM

## 2017-08-11 DIAGNOSIS — L03116 Cellulitis of left lower limb: Secondary | ICD-10-CM

## 2017-08-11 DIAGNOSIS — Z79891 Long term (current) use of opiate analgesic: Secondary | ICD-10-CM | POA: Diagnosis not present

## 2017-08-11 DIAGNOSIS — Z885 Allergy status to narcotic agent status: Secondary | ICD-10-CM | POA: Diagnosis not present

## 2017-08-11 DIAGNOSIS — E11628 Type 2 diabetes mellitus with other skin complications: Secondary | ICD-10-CM | POA: Diagnosis present

## 2017-08-11 DIAGNOSIS — Z79899 Other long term (current) drug therapy: Secondary | ICD-10-CM | POA: Diagnosis not present

## 2017-08-11 DIAGNOSIS — R162 Hepatomegaly with splenomegaly, not elsewhere classified: Secondary | ICD-10-CM | POA: Diagnosis present

## 2017-08-11 DIAGNOSIS — Z8249 Family history of ischemic heart disease and other diseases of the circulatory system: Secondary | ICD-10-CM | POA: Diagnosis not present

## 2017-08-11 DIAGNOSIS — I10 Essential (primary) hypertension: Secondary | ICD-10-CM | POA: Diagnosis present

## 2017-08-11 DIAGNOSIS — Z794 Long term (current) use of insulin: Secondary | ICD-10-CM | POA: Diagnosis not present

## 2017-08-11 DIAGNOSIS — Z818 Family history of other mental and behavioral disorders: Secondary | ICD-10-CM | POA: Diagnosis not present

## 2017-08-11 DIAGNOSIS — Z833 Family history of diabetes mellitus: Secondary | ICD-10-CM | POA: Diagnosis not present

## 2017-08-11 DIAGNOSIS — R1032 Left lower quadrant pain: Secondary | ICD-10-CM | POA: Diagnosis present

## 2017-08-11 DIAGNOSIS — E876 Hypokalemia: Secondary | ICD-10-CM | POA: Diagnosis present

## 2017-08-11 LAB — CBC
HCT: 39.6 % (ref 36.0–46.0)
Hemoglobin: 12.6 g/dL (ref 12.0–15.0)
MCH: 28 pg (ref 26.0–34.0)
MCHC: 31.8 g/dL (ref 30.0–36.0)
MCV: 88 fL (ref 78.0–100.0)
PLATELETS: 148 10*3/uL — AB (ref 150–400)
RBC: 4.5 MIL/uL (ref 3.87–5.11)
RDW: 13.8 % (ref 11.5–15.5)
WBC: 7 10*3/uL (ref 4.0–10.5)

## 2017-08-11 LAB — BASIC METABOLIC PANEL
Anion gap: 10 (ref 5–15)
BUN: 9 mg/dL (ref 6–20)
CHLORIDE: 103 mmol/L (ref 101–111)
CO2: 21 mmol/L — AB (ref 22–32)
CREATININE: 0.68 mg/dL (ref 0.44–1.00)
Calcium: 8.2 mg/dL — ABNORMAL LOW (ref 8.9–10.3)
GFR calc non Af Amer: >60 mL/min (ref >60)
GLUCOSE: 291 mg/dL — AB (ref 65–99)
Potassium: 3.2 mmol/L — ABNORMAL LOW (ref 3.5–5.1)
Sodium: 134 mmol/L — ABNORMAL LOW (ref 135–145)

## 2017-08-11 LAB — CBG MONITORING, ED
GLUCOSE-CAPILLARY: 259 mg/dL — AB (ref 65–99)
GLUCOSE-CAPILLARY: 261 mg/dL — AB (ref 65–99)

## 2017-08-11 LAB — LACTIC ACID, PLASMA: Lactic Acid, Venous: 0.8 mmol/L (ref 0.5–1.9)

## 2017-08-11 LAB — HEMOGLOBIN A1C
Hgb A1c MFr Bld: 10.7 % — ABNORMAL HIGH (ref 4.8–5.6)
Mean Plasma Glucose: 260.39 mg/dL

## 2017-08-11 LAB — I-STAT CG4 LACTIC ACID, ED: Lactic Acid, Venous: 2.1 mmol/L (ref 0.5–1.9)

## 2017-08-11 LAB — GLUCOSE, CAPILLARY
GLUCOSE-CAPILLARY: 251 mg/dL — AB (ref 65–99)
GLUCOSE-CAPILLARY: 199 mg/dL — AB (ref 65–99)
GLUCOSE-CAPILLARY: 200 mg/dL — AB (ref 65–99)

## 2017-08-11 MED ORDER — IBUPROFEN 200 MG PO TABS
600.0000 mg | ORAL_TABLET | Freq: Four times a day (QID) | ORAL | Status: DC | PRN
  Filled 2017-08-11: qty 3

## 2017-08-11 MED ORDER — CLINDAMYCIN PHOSPHATE 600 MG/50ML IV SOLN
600.0000 mg | Freq: Once | INTRAVENOUS | Status: AC
  Administered 2017-08-11: 600 mg via INTRAVENOUS
  Filled 2017-08-11: qty 50

## 2017-08-11 MED ORDER — INSULIN GLARGINE 100 UNIT/ML ~~LOC~~ SOLN
55.0000 [IU] | Freq: Every day | SUBCUTANEOUS | Status: DC
  Administered 2017-08-11 – 2017-08-12 (×3): 55 [IU] via SUBCUTANEOUS
  Filled 2017-08-11 (×4): qty 0.55

## 2017-08-11 MED ORDER — INSULIN ASPART 100 UNIT/ML ~~LOC~~ SOLN
0.0000 [IU] | Freq: Every day | SUBCUTANEOUS | Status: DC
  Administered 2017-08-12: 2 [IU] via SUBCUTANEOUS

## 2017-08-11 MED ORDER — ACETAMINOPHEN 325 MG PO TABS: 650.0000 mg | ORAL_TABLET | Freq: Four times a day (QID) | ORAL | Status: DC | PRN

## 2017-08-11 MED ORDER — INSULIN ASPART 100 UNIT/ML ~~LOC~~ SOLN
0.0000 [IU] | Freq: Three times a day (TID) | SUBCUTANEOUS | Status: DC
  Administered 2017-08-11: 4 [IU] via SUBCUTANEOUS
  Administered 2017-08-11: 11 [IU] via SUBCUTANEOUS
  Administered 2017-08-12: 4 [IU] via SUBCUTANEOUS
  Administered 2017-08-12: 7 [IU] via SUBCUTANEOUS
  Administered 2017-08-12 – 2017-08-13 (×2): 4 [IU] via SUBCUTANEOUS
  Administered 2017-08-13: 3 [IU] via SUBCUTANEOUS

## 2017-08-11 MED ORDER — INSULIN ASPART 100 UNIT/ML ~~LOC~~ SOLN
6.0000 [IU] | Freq: Three times a day (TID) | SUBCUTANEOUS | Status: DC
  Administered 2017-08-11 – 2017-08-13 (×7): 6 [IU] via SUBCUTANEOUS

## 2017-08-11 MED ORDER — PANTOPRAZOLE SODIUM 40 MG PO TBEC
40.0000 mg | DELAYED_RELEASE_TABLET | Freq: Two times a day (BID) | ORAL | Status: DC
  Administered 2017-08-11 – 2017-08-13 (×4): 40 mg via ORAL
  Filled 2017-08-11 (×4): qty 1

## 2017-08-11 MED ORDER — ONDANSETRON HCL 4 MG/2ML IJ SOLN
4.0000 mg | Freq: Three times a day (TID) | INTRAMUSCULAR | Status: DC
  Administered 2017-08-11 – 2017-08-13 (×7): 4 mg via INTRAVENOUS
  Filled 2017-08-11 (×7): qty 2

## 2017-08-11 MED ORDER — ACETAMINOPHEN 650 MG RE SUPP: 650.0000 mg | Freq: Four times a day (QID) | RECTAL | Status: DC | PRN

## 2017-08-11 MED ORDER — ACETAMINOPHEN 500 MG PO TABS
1000.0000 mg | ORAL_TABLET | Freq: Once | ORAL | Status: AC
  Administered 2017-08-11: 1000 mg via ORAL
  Filled 2017-08-11: qty 2

## 2017-08-11 MED ORDER — IBUPROFEN 400 MG PO TABS
400.0000 mg | ORAL_TABLET | Freq: Once | ORAL | Status: AC
  Administered 2017-08-11: 400 mg via ORAL
  Filled 2017-08-11: qty 1

## 2017-08-11 MED ORDER — POTASSIUM CHLORIDE CRYS ER 20 MEQ PO TBCR
40.0000 meq | EXTENDED_RELEASE_TABLET | Freq: Once | ORAL | Status: AC
  Administered 2017-08-11: 40 meq via ORAL
  Filled 2017-08-11: qty 2

## 2017-08-11 MED ORDER — HYDRALAZINE HCL 20 MG/ML IJ SOLN: 10.0000 mg | INTRAMUSCULAR | Status: DC | PRN

## 2017-08-11 MED ORDER — SODIUM CHLORIDE 0.9 % IV BOLUS (SEPSIS)
1000.0000 mL | Freq: Once | INTRAVENOUS | Status: AC
  Administered 2017-08-11: 1000 mL via INTRAVENOUS

## 2017-08-11 MED ORDER — ONDANSETRON HCL 4 MG/2ML IJ SOLN: 4.0000 mg | Freq: Four times a day (QID) | INTRAMUSCULAR | Status: DC | PRN

## 2017-08-11 MED ORDER — ENOXAPARIN SODIUM 40 MG/0.4ML ~~LOC~~ SOLN
40.0000 mg | SUBCUTANEOUS | Status: DC
  Administered 2017-08-11 – 2017-08-12 (×2): 40 mg via SUBCUTANEOUS
  Filled 2017-08-11 (×2): qty 0.4

## 2017-08-11 MED ORDER — CLINDAMYCIN PHOSPHATE 600 MG/50ML IV SOLN
600.0000 mg | Freq: Three times a day (TID) | INTRAVENOUS | Status: DC
  Administered 2017-08-11 – 2017-08-13 (×7): 600 mg via INTRAVENOUS
  Filled 2017-08-11 (×7): qty 50

## 2017-08-11 MED ORDER — TRAMADOL-ACETAMINOPHEN 37.5-325 MG PO TABS
2.0000 | ORAL_TABLET | Freq: Four times a day (QID) | ORAL | Status: DC | PRN
  Administered 2017-08-11 – 2017-08-12 (×3): 2 via ORAL
  Filled 2017-08-11 (×3): qty 2

## 2017-08-11 MED ORDER — ONDANSETRON HCL 4 MG PO TABS: 4.0000 mg | ORAL_TABLET | Freq: Four times a day (QID) | ORAL | Status: DC | PRN

## 2017-08-11 MED ORDER — LACTATED RINGERS IV SOLN
INTRAVENOUS | Status: DC
  Administered 2017-08-11: 06:00:00 via INTRAVENOUS

## 2017-08-11 NOTE — Consult Note (Signed)
Referring Provider: [facility] Primary Care Physician:  Gillis SantaMontero, Heather-John H, MD Primary Gastroenterologist:  Dr. Darrick PennaFields (previously unassigned)  Date of Admission: 08/10/2017 Date of Consultation: 08/11/2017  Reason for Consultation:  Requested urgent for abdominal pain x 2 months  HPI:  Brittany [name] is a 37 y.o. female with a past medical history of anxiety, morbid obesity, recurrent cellulitis and abscess of the leg, chronic abdominal pain, uncontrolled diabetes, gallstones, umbilical hernia, sleep apnea.  Hospitalist note not available.  H&P for admission reviewed.  She presented to the emergency department with gradual onset and worsening of left lower quadrant abdominal pain over the previous 3 months which is acute on chronic pain although noted to be worse this morning and associated with several episodes of nausea and vomiting along with a fever.  The pain was described as nonradiating and stabbing.  Denied diarrhea, stool color changes.  Unrelated found to have erythema of left lower extremity.  She was febrile on admission.  CT of the abdomen and pelvis with contrast performed and essentially found no acute process but interval enlargement of iliac and pelvic sidewall lymph node as well as splenomegaly recommended evaluation for systemic infectious, inflammatory, or lymphoproliferative process.  Interestingly, her CT also noted stable hepatomegaly.  She was admitted for sepsis with fever secondary to left lower extremity cellulitis, acute on problem abdominal pain with intractable nausea and vomiting, type 2 diabetes with hyperglycemia, hypertension.  In the emergency department she was noted to have leukocytosis with a white blood cell count of 14.6, otherwise CBC normal.  CMP with normal kidney and liver function, no elevated transaminases.  Sodium mildly decreased at 134.  Lipase normal.  Influenza negative.  Lactic acid elevated at 2.3 which improved after fluid bolus to 2.1  and then 0.8 this morning.  Urine pregnancy test negative.  Blood cultures pending.  Hemoglobin A1c 10.7.  CBC this morning did demonstrate a low platelet count at 148.  Today she states she began having abdominal pain approximately 2 months ago.  This acute on chronic abdominal pain.  His began to worsen within the past couple weeks.  She also had associated nausea and vomiting.  Although, when I discussed with her she states nausea and vomiting is not so bad her abdominal pain is more concerned.  Pain is left anterior abdomen and radiates through to her back.  Described as "sharp poker like a knife with a hot but end of the knife."  She states her nausea and vomiting is somewhat improved but persistent.  Denies early satiety.  Denies hematochezia, melena, significant weight loss.  She does have a fever but she is also admitted for sepsis due to cellulitis.  No further GI complaints.  Past Medical History:  Diagnosis Date  . Anxiety   . Carpal tunnel syndrome on left 10/12/2016  . Cellulitis and abscess of leg   . Chronic abdominal pain   . Depression   . Diabetes mellitus without complication (HCC)   . Gallstones   . Headache   . Lymphedema   . Medical history non-contributory   . Sleep apnea    DX with sleep apnea - unable to use c-pap  . Umbilical hernia     Past Surgical History:  Procedure Laterality Date  . CESAREAN SECTION  11/09/01  . CESAREAN SECTION  11/09/2001  . HERNIA REPAIR    . INSERTION OF MESH N/A 01/02/2013   Procedure: INSERTION OF MESH;  Surgeon: Lodema PilotBrian Layton, DO;  Location: [facility];  Service: General;  Laterality: N/A;  . UMBILICAL HERNIA REPAIR N/A 01/02/2013   Procedure: LAPAROSCOPIC UMBILICAL HERNIA;  Surgeon: Lodema Pilot, DO;  Location: [facility];  Service: General;  Laterality: N/A;    Prior to Admission medications   Medication Sig Start Date End Date Taking? Authorizing Provider  ibuprofen (ADVIL,MOTRIN) 200 MG tablet Take 600-800 mg by mouth every 6 (six) hours  as needed for mild pain or moderate pain.    Yes [provider]  insulin aspart (NOVOLOG) 100 UNIT/ML injection SLIDING SCALE 3 TIMES DAILY WITH MEALS: For blood sugar 70-120-no insulin. Blood sugar 121-150 take 5 units. Blood sugar 151-208 take 8 units. Blood sugar 201-250 take 12 units. Blood sugar 251-300 take 17 units. Blood sugar 301-350 take 20 units. Blood sugar 351-400 take 25 units. Blood sugar greater than 400 take 28 units and call your doctor. 03/27/16  Yes Devoria Albe, MD  insulin glargine (LANTUS) 100 UNIT/ML injection Inject 0.55 mLs (55 Units total) into the skin at bedtime. 10/26/16  Yes Philip Aspen, Limmie Patricia, MD  ondansetron (ZOFRAN) 4 MG tablet Take 1 tablet (4 mg total) by mouth every 6 (six) hours. Patient not taking: Reported on 08/10/2017 07/25/17   Ivery Quale, PA-C  pantoprazole (PROTONIX) 20 MG tablet Take 1 tablet (20 mg total) by mouth daily. Patient not taking: Reported on 08/10/2017 07/25/17   Ivery Quale, PA-C  ranitidine (ZANTAC) 150 MG tablet Take 1 tablet (150 mg total) by mouth 2 (two) times daily. Patient not taking: Reported on 08/10/2017 07/25/17   Ivery Quale, PA-C  traMADol Janean Sark) 50 MG tablet 1 or 2 po q6h prn pain Patient not taking: Reported on 08/10/2017 07/25/17   Ivery Quale, PA-C    Current Facility-Administered Medications  Medication Dose Route Frequency Provider Last Rate Last Dose  . acetaminophen (TYLENOL) tablet 650 mg  650 mg Oral Q6H PRN Sherryll Burger, Pratik D, DO       Or  . acetaminophen (TYLENOL) suppository 650 mg  650 mg Rectal Q6H PRN Sherryll Burger, Pratik D, DO      . clindamycin (CLEOCIN) IVPB 600 mg  600 mg Intravenous Q8H Shah, Pratik D, DO   Stopped at 08/11/17 1032  . enoxaparin (LOVENOX) injection 40 mg  40 mg Subcutaneous Q24H Sherryll Burger, Pratik D, DO   40 mg at 08/11/17 1027  . hydrALAZINE (APRESOLINE) injection 10 mg  10 mg Intravenous Q4H PRN Sherryll Burger, Pratik D, DO      . insulin aspart (novoLOG) injection 0-20 Units  0-20 Units  Subcutaneous TID WC Shah, Pratik D, DO   11 Units at 08/11/17 1210  . insulin aspart (novoLOG) injection 0-5 Units  0-5 Units Subcutaneous QHS Sherryll Burger, Pratik D, DO      . insulin aspart (novoLOG) injection 6 Units  6 Units Subcutaneous TID WC Shah, Pratik D, DO   6 Units at 08/11/17 1211  . insulin glargine (LANTUS) injection 55 Units  55 Units Subcutaneous QHS Maurilio Lovely D, DO   55 Units at 08/11/17 0254  . ondansetron (ZOFRAN) tablet 4 mg  4 mg Oral Q6H PRN Sherryll Burger, Pratik D, DO       Or  . ondansetron (ZOFRAN) injection 4 mg  4 mg Intravenous Q6H PRN Sherryll Burger, Pratik D, DO      . traMADol-acetaminophen (ULTRACET) 37.5-325 MG per tablet 2 tablet  2 tablet Oral Q6H PRN Dhungel, Nishant, MD   2 tablet at 08/11/17 1028    Allergies as of 08/10/2017 - Review Complete 08/10/2017  Allergen  Reaction Noted  . Morphine and related Other (See Comments) 01/26/2017  . Latex Itching, Swelling, and Rash 11/26/2012  . Sulfa antibiotics Nausea And Vomiting and Rash 11/26/2012    Family History  Problem Relation Age of Onset  . Cancer Mother        leukemia  . Diabetes Mother   . Hypertension Mother   . Hyperlipidemia Mother   . Other Father        Sciatica, kidney stones  . Alcohol abuse Paternal Grandfather   . Other Paternal Grandmother        bowel obstruction; pneumonia  . Hypertension Maternal Grandmother   . Diabetes Maternal Grandmother   . Cancer Maternal Grandfather        lung  . ADD / ADHD Daughter   . Depression Daughter   . Anxiety disorder Daughter     Social History   Socioeconomic History  . Marital status: Married    Spouse name: Not on file  . Number of children: 1  . Years of education: 39  . Highest education level: Not on file  Social Needs  . Financial resource strain: Not on file  . Food insecurity - worry: Not on file  . Food insecurity - inability: Not on file  . Transportation needs - medical: Not on file  . Transportation needs - non-medical: Not on file   Occupational History  . Occupation: N/A  Tobacco Use  . Smoking status: Never Smoker  . Smokeless tobacco: Never Used  Substance and Sexual Activity  . Alcohol use: No  . Drug use: No  . Sexual activity: Yes    Birth control/protection: None    Comment: husband  Other Topics Concern  . Not on file  Social History Narrative   Lives at home w/ her husband and daughter   Right-handed   Caffeine daily    Review of Systems: General: Negative for anorexia, weight loss, fever, chills, fatigue, weakness. ENT: Negative for hoarseness, difficulty swallowing , nasal congestion. CV: Negative for chest pain, angina, palpitations, dyspnea on exertion, peripheral edema.  Respiratory: Negative for dyspnea at rest, dyspnea on exertion, cough, sputum, wheezing.  GI: See history of present illness. Derm: Negative for rash or itching.  Endo: Negative for unusual weight change.  Heme: Negative for bruising or bleeding.  Physical Exam: Vital signs in last 24 hours: Temp:  [98.2 F (36.8 C)-102.9 F (39.4 C)] 99.5 F (37.5 C) (02/01 0947) Pulse Rate:  [94-132] 103 (02/01 0947) Resp:  [17-30] 22 (02/01 0947) BP: (108-147)/(62-105) 147/94 (02/01 0947) SpO2:  [97 %-100 %] 100 % (02/01 0947) Weight:  [534 lb (242.2 kg)-535 lb (242.7 kg)] 534 lb (242.2 kg) (02/01 0947)   General:   Extreme morbid obesity (calculated BMI 74.5). Alert, Well-developed, well-nourished, pleasant and cooperative in NAD. Talkative. Head:  Normocephalic and atraumatic. Eyes:  Sclera clear, no icterus. Conjunctiva pink. Ears:  Normal auditory acuity. Nose:  No deformity, discharge,  or lesions. Lungs:  Clear throughout to auscultation. No wheezes, crackles, or rhonchi. No acute distress. Heart:  Regular rate and rhythm; no murmurs, clicks, rubs,  or gallops. Abdomen:  Obese but soft, and nondistended. Point tenderness to palpation left mid-anterior abdomen. No masses, hepatosplenomegaly or hernias noted. Normal bowel  sounds, without guarding, and without rebound.   Rectal:  Deferred.   Msk:  Symmetrical without gross deformities. Neurologic:  Alert and  oriented x4;  grossly normal neurologically.  Intake/Output from previous day: 01/31 0701 - 02/01 0700 In: 1050 [  IV Piggyback:1050] Out: -  Intake/Output this shift: No intake/output data recorded.  Lab Results: Recent Labs    08/10/17 1901 08/11/17 0545  WBC 14.6* 7.0  HGB 13.5 12.6  HCT 41.9 39.6  PLT 185 148*   BMET Recent Labs    08/10/17 1901 08/11/17 0545  NA 134* 134*  K 3.6 3.2*  CL 101 103  CO2 22 21*  GLUCOSE 316* 291*  BUN 8 9  CREATININE 0.83 0.68  CALCIUM 8.8* 8.2*   LFT Recent Labs    08/10/17 1901  PROT 7.5  ALBUMIN 3.4*  AST 23  ALT 27  ALKPHOS 80  BILITOT 1.1   PT/INR No results for input(s): LABPROT, INR in the last 72 hours. Hepatitis Panel No results for input(s): HEPBSAG, HCVAB, HEPAIGM, HEPBIGM in the last 72 hours. C-Diff No results for input(s): CDIFFTOX in the last 72 hours.  Studies/Results: Dg Chest 2 View  Result Date: 08/10/2017 CLINICAL DATA:  Fever and tachycardia. EXAM: CHEST  2 VIEW COMPARISON:  January 06, 2017 FINDINGS: Haziness over the lung bases consistent with overlapping soft tissues. The heart, hila, mediastinum, lungs, and pleura demonstrate no acute abnormalities. IMPRESSION: No active cardiopulmonary disease. Electronically Signed   By: Gerome Sam III M.D   On: 08/10/2017 20:33   Ct Abdomen Pelvis W Contrast  Result Date: 08/10/2017 CLINICAL DATA:  37 y/o  F; abdominal pain, emesis, and fever. EXAM: CT ABDOMEN AND PELVIS WITH CONTRAST TECHNIQUE: Multidetector CT imaging of the abdomen and pelvis was performed using the standard protocol following bolus administration of intravenous contrast. CONTRAST:  ISOVUE-300 IOPAMIDOL (ISOVUE-300) INJECTION 61% COMPARISON:  01/26/2017 CT abdomen and pelvis. FINDINGS: Lower chest: Right lower lobe pulmonary nodule has triangular  configuration, likely perifissural nodule, intrapulmonary lymph node. Hepatobiliary: Stable hepatomegaly. No focal liver lesion. No biliary ductal dilatation. Cholelithiasis. Pancreas: Fatty atrophy, otherwise unremarkable. No pancreatic ductal dilatation or surrounding inflammatory changes. Spleen: Splenomegaly 16.1 x 9.8 x 21.3 cm (volume = 1800 cm^3), previously 15.2 x 10.1 x 19.6 (volume = 1580) . Adrenals/Urinary Tract: Adrenal glands are unremarkable. Kidneys are normal, without renal calculi, focal lesion, or hydronephrosis. Bladder is unremarkable. Stomach/Bowel: Stomach is within normal limits. Appendix appears normal. No evidence of bowel wall thickening, distention, or inflammatory changes. Vascular/Lymphatic: Left external iliac and pelvic sidewall lymph nodes are increased in size: Left external iliac, 19 mm, series 2: Image 95; left pelvic side wall, 16 mm, 3:93. Reproductive: Uterus and bilateral adnexa are unremarkable. IUD is no longer present. Other: No abdominal wall hernia or abnormality. No abdominopelvic ascites. Musculoskeletal: No acute or significant osseous findings. IMPRESSION: 1. No acute process identified. 2. Interval increase in left external iliac and pelvic sidewall lymph node size as well as splenomegaly. Evaluation for systemic infectious, inflammatory, or lymphoproliferative process is recommended. Electronically Signed   By: Mitzi Hansen M.D.   On: 08/10/2017 23:04    Impression: Acute on chronic abdominal pain.  She has had abdominal pain for the past 2 months.  She also complains of nausea and vomiting although today she states this is somewhat improved.  Labs are mostly normal.  She did have leukocytosis and an elevated lactic acid as expected from sepsis/cellulitis.  She does have a very mildly depressed platelet count at 148.  Noted hepatosplenomegaly, which could be due to fatty liver versus physiologic enlargement given her body habitus.  Her CT imaging  does not indicate parenchymal changes or mention signs of nodularity to indicate cirrhosis or steatosis.  Her  abdominal pain is point tenderness to the left mid abdomen.  She states it radiates to her back.  This is apparently worse recently although it has been ongoing for several months.  Her CT of the abdomen did show 1-2 enlarged lymph nodes.  Query systemic infectious, inflammatory, lymphoproliferative disease and the patient would likely benefit from an oncology consult.  Her abdominal pain could be due to diabetic gastroparesis with uncontrolled diabetes and a hemoglobin A1c of 10.7.  Could also be musculoskeletal in nature given her body habitus.  Nausea and vomiting over the previous couple months also likely explained by diabetic gastroparesis versus gastroenteritis.  She does not have any diarrhea at this time making occult infection or gastroenteritis less likely.  Plan: 1. Protonix twice daily 2. Avoid NSAIDs 3. Scheduled Zofran 4. Supportive measures 5. No indication for endoscopic evaluation at this time.  Thank you for allowing [place] to participate in the care of Brittany Grates, DNP, AGNP-C Adult & Gerontological Nurse Practitioner [facility]    LOS: 0 days     08/11/2017, 2:01 PM

## 2017-08-11 NOTE — ED Notes (Signed)
Called [employer] to report Beds use at [phone].  Confirmation Use # is [id number].

## 2017-08-11 NOTE — Progress Notes (Addendum)
PROGRESS NOTE                                                                                                                                                                                                             Patient Demographics:    Brittany [name], is a 37 y.o. female, DOB - 01-12-1981, ZOX:[id number]  Admit date - 08/10/2017   Admitting Physician Pratik Hoover Brunette, DO  Outpatient Primary MD for the patient is Gillis Santa, MD  LOS - 0  Outpatient Specialists: None  Chief Complaint  Patient presents with  . Abdominal Pain       Brief Narrative  37 year old morbidly obese female with poorly controlled type 2 diabetes mellitus, OSA, going abdominal pain for past 2 months and recurrent lower extremity cellulitis with lymphedema presented with worsened left lower quadrant abdominal pain (worse in the morning with several episodes of nausea and vomiting along with subjective fever).  Denies any diarrhea, hematemesis or melena.  Reports taking plenty of NSAIDs for the pain.  She also noted pain with redness in her left leg. In the ED she was septic with fever of 102.9 F, WBC of 14.6 K and lactic acid of 2.3.  CT of the abdomen and pelvis with contrast showed lymphadenopathy with no other acute findings.  Chest x-ray unremarkable.  Patient given 2 L IV normal saline bolus and started on empiric IV clindamycin for sepsis secondary to cellulitis of left leg.   Subjective:   Reports having off-and-on crampy abdominal pain over the left quadrant.  Denies left leg pain.   Assessment  & Plan :    Principal Problem:   Sepsis (HCC) Suspect secondary to left leg cellulitis.  Continue empiric IV clindamycin.  Supportive care with Tylenol and Ultracet for pain.  Avoid NSAIDs.  Continue IV fluids.  Flu PCR negative.  UA negative for infection.  Follow blood culture sent on admission.  Repeat lactate normalized.  Active  Problems:   DM (diabetes mellitus), type 2, uncontrolled with hyperglycemia (HCC) Continue home dose Lantus with pre-meal aspart 3 times daily.  A1c of 10.7 and chronically uncontrolled.    Morbid obesity (HCC) Counseled on diet and exercise to lose weight.  Ongoing abdominal pain. Persistent symptoms since 2 months.  CT abdomen unremarkable.  She had appointment with GI in the office today.  Appreciate inpatient GI consult.  Recommend this is either musculoskeletal or possibly related to gastroparesis and reflux.  Recommend twice daily PPI, PRN Zofran and no further workup at this time.   Instructed to avoid NSAIDs.  Advance diet.   Hypokalemia Replenished   Code Status : Full code  Family Communication  : None at bedside  Disposition Plan  : Home once improved  Barriers For Discharge : Active symptoms  Consults  : GI  Procedures  : CT abdomen and pelvis  DVT Prophylaxis  :  Lovenox -   Lab Results  Component Value Date   PLT 148 (L) 08/11/2017    Antibiotics  :   Anti-infectives (From admission, onward)   Start     Dose/Rate Route Frequency Ordered Stop   08/11/17 0300  clindamycin (CLEOCIN) IVPB 600 mg     600 mg 100 mL/hr over 30 Minutes Intravenous Every 8 hours 08/11/17 0117     08/11/17 0030  clindamycin (CLEOCIN) IVPB 600 mg     600 mg 100 mL/hr over 30 Minutes Intravenous  Once 08/11/17 0027 08/11/17 0201        Objective:   Vitals:   08/11/17 0008 08/11/17 0551 08/11/17 0808 08/11/17 0947  BP: 126/79 118/82  (!) 147/94  Pulse: (!) 116 94  (!) 103  Resp:  18  (!) 22  Temp: (!) 102.9 F (39.4 C) 98.2 F (36.8 C)  99.5 F (37.5 C)  TempSrc: Oral Oral  Oral  SpO2: 99% 99%  100%  Weight:    (!) 242.2 kg (534 lb)  Height:   5\' 11"  (1.803 m) 5\' 11"  (1.803 m)    Wt Readings from Last 3 Encounters:  08/11/17 (!) 242.2 kg (534 lb)  06/30/17 (!) 265.4 kg (585 lb)  04/20/17 (!) 234.6 kg (517 lb 3.2 oz)     Intake/Output Summary (Last 24 hours) at  08/11/2017 1639 Last data filed at 08/11/2017 1032 Gross per 24 hour  Intake 1100 ml  Output -  Net 1100 ml     Physical Exam  Gen: not in distress HEENT: moist mucosa, supple neck Chest: clear b/l, no added sounds CVS: N S1&S2, no murmurs, rubs or gallop GI: soft, nondistended, bowel sounds present, left mid quadrant tenderness to pressure Musculoskeletal: warm, lymphedema with significant erythema over posterior left thigh extending to the calf.  Tender to pressure, no fluctuation or discharge.     Data Review:    CBC Recent Labs  Lab 08/10/17 1901 08/11/17 0545  WBC 14.6* 7.0  HGB 13.5 12.6  HCT 41.9 39.6  PLT 185 148*  MCV 87.8 88.0  MCH 28.3 28.0  MCHC 32.2 31.8  RDW 13.5 13.8  LYMPHSABS 0.7  --   MONOABS 0.3  --   EOSABS 0.0  --   BASOSABS 0.0  --     Chemistries  Recent Labs  Lab 08/10/17 1901 08/11/17 0545  NA 134* 134*  K 3.6 3.2*  CL 101 103  CO2 22 21*  GLUCOSE 316* 291*  BUN 8 9  CREATININE 0.83 0.68  CALCIUM 8.8* 8.2*  AST 23  --   ALT 27  --   ALKPHOS 80  --   BILITOT 1.1  --    ------------------------------------------------------------------------------------------------------------------ No results for input(s): CHOL, HDL, LDLCALC, TRIG, CHOLHDL, LDLDIRECT in the last 72 hours.  Lab Results  Component Value Date   HGBA1C 10.7 (H) 08/11/2017   ------------------------------------------------------------------------------------------------------------------ No results for input(s): TSH, T4TOTAL, T3FREE, THYROIDAB in the  last 72 hours.  Invalid input(s): FREET3 ------------------------------------------------------------------------------------------------------------------ No results for input(s): VITAMINB12, FOLATE, FERRITIN, TIBC, IRON, RETICCTPCT in the last 72 hours.  Coagulation profile No results for input(s): INR, PROTIME in the last 168 hours.  No results for input(s): DDIMER in the last 72 hours.  Cardiac Enzymes No  results for input(s): CKMB, TROPONINI, MYOGLOBIN in the last 168 hours.  Invalid input(s): CK ------------------------------------------------------------------------------------------------------------------ No results found for: BNP  Inpatient Medications  Scheduled Meds: . enoxaparin (LOVENOX) injection  40 mg Subcutaneous Q24H  . insulin aspart  0-20 Units Subcutaneous TID WC  . insulin aspart  0-5 Units Subcutaneous QHS  . insulin aspart  6 Units Subcutaneous TID WC  . insulin glargine  55 Units Subcutaneous QHS  . ondansetron (ZOFRAN) IV  4 mg Intravenous TID WC & HS  . pantoprazole  40 mg Oral BID AC   Continuous Infusions: . clindamycin (CLEOCIN) IV Stopped (08/11/17 1032)   PRN Meds:.acetaminophen **OR** acetaminophen, hydrALAZINE, ondansetron **OR** ondansetron (ZOFRAN) IV, traMADol-acetaminophen  Micro Results Recent Results (from the past 240 hour(s))  Culture, blood (routine x 2)     Status: None (Preliminary result)   Collection Time: 08/10/17  8:38 PM  Result Value Ref Range Status   Specimen Description RIGHT ANTECUBITAL  Final   Special Requests   Final    BOTTLES DRAWN AEROBIC AND ANAEROBIC Blood Culture adequate volume   Culture   Final    NO GROWTH < 12 HOURS Performed at [facility], [address] [place], [place] [address]    Report Status PENDING  Incomplete  Culture, blood (routine x 2)     Status: None (Preliminary result)   Collection Time: 08/10/17  8:42 PM  Result Value Ref Range Status   Specimen Description RIGHT ANTECUBITAL  Final   Special Requests   Final    BOTTLES DRAWN AEROBIC ONLY Blood Culture adequate volume   Culture   Final    NO GROWTH < 12 HOURS Performed at [facility], [address] [place], [place] [address]    Report Status PENDING  Incomplete    Radiology Reports Dg Chest 2 View  Result Date: 08/10/2017 CLINICAL DATA:  Fever and tachycardia. EXAM: CHEST  2 VIEW COMPARISON:  January 06, 2017 FINDINGS: Haziness over the  lung bases consistent with overlapping soft tissues. The heart, hila, mediastinum, lungs, and pleura demonstrate no acute abnormalities. IMPRESSION: No active cardiopulmonary disease. Electronically Signed   By: Gerome Sam III M.D   On: 08/10/2017 20:33   Ct Abdomen Pelvis W Contrast  Result Date: 08/10/2017 CLINICAL DATA:  37 y/o  F; abdominal pain, emesis, and fever. EXAM: CT ABDOMEN AND PELVIS WITH CONTRAST TECHNIQUE: Multidetector CT imaging of the abdomen and pelvis was performed using the standard protocol following bolus administration of intravenous contrast. CONTRAST:  ISOVUE-300 IOPAMIDOL (ISOVUE-300) INJECTION 61% COMPARISON:  01/26/2017 CT abdomen and pelvis. FINDINGS: Lower chest: Right lower lobe pulmonary nodule has triangular configuration, likely perifissural nodule, intrapulmonary lymph node. Hepatobiliary: Stable hepatomegaly. No focal liver lesion. No biliary ductal dilatation. Cholelithiasis. Pancreas: Fatty atrophy, otherwise unremarkable. No pancreatic ductal dilatation or surrounding inflammatory changes. Spleen: Splenomegaly 16.1 x 9.8 x 21.3 cm (volume = 1800 cm^3), previously 15.2 x 10.1 x 19.6 (volume = 1580) . Adrenals/Urinary Tract: Adrenal glands are unremarkable. Kidneys are normal, without renal calculi, focal lesion, or hydronephrosis. Bladder is unremarkable. Stomach/Bowel: Stomach is within normal limits. Appendix appears normal. No evidence of bowel wall thickening, distention, or inflammatory changes. Vascular/Lymphatic: Left  external iliac and pelvic sidewall lymph nodes are increased in size: Left external iliac, 19 mm, series 2: Image 95; left pelvic side wall, 16 mm, 3:93. Reproductive: Uterus and bilateral adnexa are unremarkable. IUD is no longer present. Other: No abdominal wall hernia or abnormality. No abdominopelvic ascites. Musculoskeletal: No acute or significant osseous findings. IMPRESSION: 1. No acute process identified. 2. Interval increase in left  external iliac and pelvic sidewall lymph node size as well as splenomegaly. Evaluation for systemic infectious, inflammatory, or lymphoproliferative process is recommended. Electronically Signed   By: Mitzi HansenLance  Furusawa-Stratton M.D.   On: 08/10/2017 23:04    Time Spent in minutes  25   Saige Busby M.D on 08/11/2017 at 4:39 PM  Between 7am to 7pm - Pager - [phone]  After 7pm go to [email or web address] - password [email or web address]  [facility] -  Office  [phone]

## 2017-08-11 NOTE — H&P (Signed)
History and Physical    Brittany [name] UEA:[id number] DOB: 06-Dec-1980 DOA: 08/10/2017  PCP: Gillis Santa, MD   Patient coming from: Home  Chief Complaint: LLQ abdominal pain and LLE erythema  HPI: Brittany [name] is a 37 y.o. female with medical history significant for morbid obesity, poorly controlled type 2 diabetes, obstructive sleep apnea, chronic abdominal pain with lymphadenopathy, and recurrent left lower extremity cellulitis with lymphedema who presents to the emergency department with gradual onset and worsening of left lower quadrant abdominal pain over the last 3 months which is noted to be worse this morning and was associated with several episodes of nausea and vomiting along with fever.  Her pain is noted to be stabbing in nature and nonradiating.  She denies any diarrhea, chest pain, shortness of breath, changes to stool color or other related concerns.  She is also noted to have some pain to her left lower extremity with some erythema which she does get from time to time.  Her last diagnosis of cellulitis was approximately 1 year prior.   ED Course: She is currently febrile with temperature 102.48F, but other vital signs are stable.  Laboratory data remarkable for leukocytosis of 14,600 along with initial lactic acid of 2.3 and glucose of 316.  CT of the abdomen and pelvis with contrast performed at [facility] due to morbid obesity demonstrating lymphadenopathy with no other acute findings.  Chest x-ray with no acute findings.  She has been given some Tylenol for her fever and has been started on clindamycin IV.  She has also been given 2 L of IV fluid bolus after which lactic is now 2.1.  Review of Systems: As per HPI otherwise 10 point review of systems negative.   Past Medical History:  Diagnosis Date  . Anxiety   . Carpal tunnel syndrome on left 10/12/2016  . Cellulitis and abscess of leg   . Chronic abdominal pain   . Depression   . Diabetes mellitus without  complication (HCC)   . Gallstones   . Lymphedema   . Sleep apnea    DX with sleep apnea - unable to use c-pap  . Umbilical hernia     Past Surgical History:  Procedure Laterality Date  . CESAREAN SECTION  11/09/01  . INSERTION OF MESH N/A 01/02/2013   Procedure: INSERTION OF MESH;  Surgeon: Lodema Pilot, DO;  Location: [facility]S;  Service: General;  Laterality: N/A;  . UMBILICAL HERNIA REPAIR N/A 01/02/2013   Procedure: LAPAROSCOPIC UMBILICAL HERNIA;  Surgeon: Lodema Pilot, DO;  Location: [facility];  Service: General;  Laterality: N/A;     reports that  has never smoked. she has never used smokeless tobacco. She reports that she does not drink alcohol or use drugs.  Allergies  Allergen Reactions  . Morphine And Related Other (See Comments)    "it just freaks me out"  . Latex Itching, Swelling and Rash  . Sulfa Antibiotics Nausea And Vomiting and Rash    Family History  Problem Relation Age of Onset  . Cancer Mother        leukemia  . Diabetes Mother   . Hypertension Mother   . Hyperlipidemia Mother   . Other Father        Sciatica  . Alcohol abuse Paternal Grandfather   . Other Paternal Grandmother        bowel obstruction; pneumonia  . Cancer Maternal Grandfather        lung  . ADD / ADHD  Daughter   . Depression Daughter   . Anxiety disorder Daughter     Prior to Admission medications   Medication Sig Start Date End Date Taking? Authorizing Provider  ibuprofen (ADVIL,MOTRIN) 200 MG tablet Take 600-800 mg by mouth every 6 (six) hours as needed for mild pain or moderate pain.    Yes [provider]  insulin aspart (NOVOLOG) 100 UNIT/ML injection SLIDING SCALE 3 TIMES DAILY WITH MEALS: For blood sugar 70-120-no insulin. Blood sugar 121-150 take 5 units. Blood sugar 151-208 take 8 units. Blood sugar 201-250 take 12 units. Blood sugar 251-300 take 17 units. Blood sugar 301-350 take 20 units. Blood sugar 351-400 take 25 units. Blood sugar greater than 400 take 28 units and  call your doctor. 03/27/16  Yes Devoria Albe, MD  insulin glargine (LANTUS) 100 UNIT/ML injection Inject 0.55 mLs (55 Units total) into the skin at bedtime. 10/26/16  Yes Philip Aspen, Limmie Patricia, MD  ondansetron (ZOFRAN) 4 MG tablet Take 1 tablet (4 mg total) by mouth every 6 (six) hours. Patient not taking: Reported on 08/10/2017 07/25/17   Ivery Quale, PA-C  pantoprazole (PROTONIX) 20 MG tablet Take 1 tablet (20 mg total) by mouth daily. Patient not taking: Reported on 08/10/2017 07/25/17   Ivery Quale, PA-C  ranitidine (ZANTAC) 150 MG tablet Take 1 tablet (150 mg total) by mouth 2 (two) times daily. Patient not taking: Reported on 08/10/2017 07/25/17   Ivery Quale, PA-C  traMADol Janean Sark) 50 MG tablet 1 or 2 po q6h prn pain Patient not taking: Reported on 08/10/2017 07/25/17   Ivery Quale, PA-C    Physical Exam: Vitals:   08/10/17 1849 08/10/17 1953 08/10/17 2100 08/11/17 0008  BP: (!) 142/105 108/62 126/66 126/79  Pulse: (!) 132 (!) 114 (!) 109 (!) 116  Resp: 20 17 (!) 30   Temp: (!) 102.5 F (39.2 C) (!) 101 F (38.3 C)  (!) 102.9 F (39.4 C)  TempSrc: Oral Oral  Oral  SpO2: 100% 98% 97% 99%  Weight: (!) 242.7 kg (535 lb)       Constitutional: NAD, calm, comfortable; morbidly obese Vitals:   08/10/17 1849 08/10/17 1953 08/10/17 2100 08/11/17 0008  BP: (!) 142/105 108/62 126/66 126/79  Pulse: (!) 132 (!) 114 (!) 109 (!) 116  Resp: 20 17 (!) 30   Temp: (!) 102.5 F (39.2 C) (!) 101 F (38.3 C)  (!) 102.9 F (39.4 C)  TempSrc: Oral Oral  Oral  SpO2: 100% 98% 97% 99%  Weight: (!) 242.7 kg (535 lb)      Eyes: lids and conjunctivae normal ENMT: Mucous membranes are moist.  Neck: normal, supple Respiratory: clear to auscultation bilaterally. Normal respiratory effort. No accessory muscle use.  Cardiovascular: Regular rate and rhythm, no murmurs. No extremity edema. Abdomen: no tenderness, no distention. Bowel sounds positive.  Musculoskeletal:  No joint deformity upper  and lower extremities.   Skin: LLE inner thigh with erythema; no drainage Psychiatric: Normal judgment and insight. Alert and oriented x 3. Normal mood.   Labs on Admission: I have personally reviewed following labs and imaging studies  CBC: Recent Labs  Lab 08/10/17 1901  WBC 14.6*  NEUTROABS 13.7*  HGB 13.5  HCT 41.9  MCV 87.8  PLT 185   Basic Metabolic Panel: Recent Labs  Lab 08/10/17 1901  NA 134*  K 3.6  CL 101  CO2 22  GLUCOSE 316*  BUN 8  CREATININE 0.83  CALCIUM 8.8*   GFR: Estimated Creatinine Clearance: 200.5 mL/min (  by C-G formula based on SCr of 0.83 mg/dL). Liver Function Tests: Recent Labs  Lab 08/10/17 1901  AST 23  ALT 27  ALKPHOS 80  BILITOT 1.1  PROT 7.5  ALBUMIN 3.4*   Recent Labs  Lab 08/10/17 1926  LIPASE 18   No results for input(s): AMMONIA in the last 168 hours. Coagulation Profile: No results for input(s): INR, PROTIME in the last 168 hours. Cardiac Enzymes: No results for input(s): CKTOTAL, CKMB, CKMBINDEX, TROPONINI in the last 168 hours. BNP (last 3 results) No results for input(s): PROBNP in the last 8760 hours. HbA1C: No results for input(s): HGBA1C in the last 72 hours. CBG: Recent Labs  Lab 08/10/17 1855 08/11/17 0006  GLUCAP 301* 261*   Lipid Profile: No results for input(s): CHOL, HDL, LDLCALC, TRIG, CHOLHDL, LDLDIRECT in the last 72 hours. Thyroid Function Tests: No results for input(s): TSH, T4TOTAL, FREET4, T3FREE, THYROIDAB in the last 72 hours. Anemia Panel: No results for input(s): VITAMINB12, FOLATE, FERRITIN, TIBC, IRON, RETICCTPCT in the last 72 hours. Urine analysis:    Component Value Date/Time   COLORURINE YELLOW 08/10/2017 1852   APPEARANCEUR CLEAR 08/10/2017 1852   LABSPEC 1.020 08/10/2017 1852   PHURINE 7.0 08/10/2017 1852   GLUCOSEU >=500 (A) 08/10/2017 1852   HGBUR NEGATIVE 08/10/2017 1852   BILIRUBINUR NEGATIVE 08/10/2017 1852   KETONESUR NEGATIVE 08/10/2017 1852   PROTEINUR 30 (A)  08/10/2017 1852   UROBILINOGEN 0.2 02/22/2013 0414   NITRITE NEGATIVE 08/10/2017 1852   LEUKOCYTESUR NEGATIVE 08/10/2017 1852    Radiological Exams on Admission: Dg Chest 2 View  Result Date: 08/10/2017 CLINICAL DATA:  Fever and tachycardia. EXAM: CHEST  2 VIEW COMPARISON:  January 06, 2017 FINDINGS: Haziness over the lung bases consistent with overlapping soft tissues. The heart, hila, mediastinum, lungs, and pleura demonstrate no acute abnormalities. IMPRESSION: No active cardiopulmonary disease. Electronically Signed   By: Gerome Samavid  Williams III M.D   On: 08/10/2017 20:33   Ct Abdomen Pelvis W Contrast  Result Date: 08/10/2017 CLINICAL DATA:  37 y/o  F; abdominal pain, emesis, and fever. EXAM: CT ABDOMEN AND PELVIS WITH CONTRAST TECHNIQUE: Multidetector CT imaging of the abdomen and pelvis was performed using the standard protocol following bolus administration of intravenous contrast. CONTRAST:  100mL ISOVUE-300 IOPAMIDOL (ISOVUE-300) INJECTION 61% COMPARISON:  01/26/2017 CT abdomen and pelvis. FINDINGS: Lower chest: Right lower lobe pulmonary nodule has triangular configuration, likely perifissural nodule, intrapulmonary lymph node. Hepatobiliary: Stable hepatomegaly. No focal liver lesion. No biliary ductal dilatation. Cholelithiasis. Pancreas: Fatty atrophy, otherwise unremarkable. No pancreatic ductal dilatation or surrounding inflammatory changes. Spleen: Splenomegaly 16.1 x 9.8 x 21.3 cm (volume = 1800 cm^3), previously 15.2 x 10.1 x 19.6 (volume = 1580) . Adrenals/Urinary Tract: Adrenal glands are unremarkable. Kidneys are normal, without renal calculi, focal lesion, or hydronephrosis. Bladder is unremarkable. Stomach/Bowel: Stomach is within normal limits. Appendix appears normal. No evidence of bowel wall thickening, distention, or inflammatory changes. Vascular/Lymphatic: Left external iliac and pelvic sidewall lymph nodes are increased in size: Left external iliac, 19 mm, series 2: Image 95;  left pelvic side wall, 16 mm, 3:93. Reproductive: Uterus and bilateral adnexa are unremarkable. IUD is no longer present. Other: No abdominal wall hernia or abnormality. No abdominopelvic ascites. Musculoskeletal: No acute or significant osseous findings. IMPRESSION: 1. No acute process identified. 2. Interval increase in left external iliac and pelvic sidewall lymph node size as well as splenomegaly. Evaluation for systemic infectious, inflammatory, or lymphoproliferative process is recommended. Electronically Signed   By: Micah NoelLance  Furusawa-Stratton M.D.   On: 08/10/2017 23:04    Assessment/Plan Principal Problem:   Sepsis (HCC) Active Problems:   DM (diabetes mellitus), type 2, uncontrolled (HCC)   Morbid obesity (HCC)   Cellulitis of left lower extremity   Sleep apnea    1. Sepsis with fever secondary to left lower extremity cellulitis.  Continue on IV clindamycin empirically.  Follow blood cultures which have been obtained.  Tylenol and ibuprofen as needed for fever. Repeat lactic in am. 2. Abdominal pain with intractable nausea and vomiting.  This is acute on chronic and may be suggestive of some gastroenteritis.  Will treat symptomatically with Zofran and maintain on IV fluid for hydration.  Bowel rest with n.p.o. for now and can advance as tolerated. 3. Type 2 diabetes with hyperglycemia.  Maintain on resistant sliding scale insulin.  Maintain on carb modified diet.  Maintain on long-acting insulin. 4. Hypertension.  Hydralazine as needed to maintain control.   DVT prophylaxis: Lovenox Code Status: Full Family Communication: Spouse at bedside Disposition Plan:Home when improved Consults called:None Admission status: Inpatient, med-surg   Donella Pascarella Hoover Brunette DO [facility] Pager [phone]  If 7PM-7AM, please contact night-coverage [email or web address] Password [facility]  08/11/2017, 12:56 AM

## 2017-08-12 LAB — BASIC METABOLIC PANEL
ANION GAP: 13 (ref 5–15)
BUN: 10 mg/dL (ref 6–20)
CO2: 19 mmol/L — AB (ref 22–32)
Calcium: 8.3 mg/dL — ABNORMAL LOW (ref 8.9–10.3)
Chloride: 104 mmol/L (ref 101–111)
Creatinine, Ser: 0.67 mg/dL (ref 0.44–1.00)
GFR calc non Af Amer: >60 mL/min (ref >60)
Glucose, Bld: 189 mg/dL — ABNORMAL HIGH (ref 65–99)
Potassium: 3.5 mmol/L (ref 3.5–5.1)
SODIUM: 136 mmol/L (ref 135–145)

## 2017-08-12 LAB — GLUCOSE, CAPILLARY
GLUCOSE-CAPILLARY: 206 mg/dL — AB (ref 65–99)
GLUCOSE-CAPILLARY: 164 mg/dL — AB (ref 65–99)
Glucose-Capillary: 168 mg/dL — ABNORMAL HIGH (ref 65–99)
Glucose-Capillary: 203 mg/dL — ABNORMAL HIGH (ref 65–99)

## 2017-08-12 LAB — CBC
HEMATOCRIT: 36.2 % (ref 36.0–46.0)
HEMOGLOBIN: 11.6 g/dL — AB (ref 12.0–15.0)
MCH: 28.2 pg (ref 26.0–34.0)
MCHC: 32 g/dL (ref 30.0–36.0)
MCV: 87.9 fL (ref 78.0–100.0)
Platelets: 130 10*3/uL — ABNORMAL LOW (ref 150–400)
RBC: 4.12 MIL/uL (ref 3.87–5.11)
RDW: 14 % (ref 11.5–15.5)
WBC: 5.4 10*3/uL (ref 4.0–10.5)

## 2017-08-12 LAB — URINE CULTURE

## 2017-08-12 MED ORDER — ENOXAPARIN SODIUM 100 MG/ML ~~LOC~~ SOLN
100.0000 mg | SUBCUTANEOUS | Status: DC
  Administered 2017-08-13: 100 mg via SUBCUTANEOUS
  Filled 2017-08-12: qty 1

## 2017-08-12 NOTE — Progress Notes (Signed)
PROGRESS NOTE                                                                                                                                                                                                             Patient Demographics:    Brittany [name], is a 37 y.o. female, DOB - [birth year], ZOX:[id number]  Admit date - 08/10/2017   Admitting Physician Pratik Hoover Brunette, DO  Outpatient Primary MD for the patient is Gillis Santa, MD  LOS - 1  Outpatient Specialists: None  Chief Complaint  Patient presents with  . Abdominal Pain       Brief Narrative  37 year old morbidly obese female with poorly controlled type 2 diabetes mellitus, OSA, going abdominal pain for past 2 months and recurrent lower extremity cellulitis with lymphedema presented with worsened left lower quadrant abdominal pain (worse in the morning with several episodes of nausea and vomiting along with subjective fever).  Denies any diarrhea, hematemesis or melena.  Reports taking plenty of NSAIDs for the pain.  She also noted pain with redness in her left leg. In the ED she was septic with fever of 102.9 F, WBC of 14.6 K and lactic acid of 2.3.  CT of the abdomen and pelvis with contrast showed lymphadenopathy with no other acute findings.  Chest x-ray unremarkable.  Patient given 2 L IV normal saline bolus and started on empiric IV clindamycin for sepsis secondary to cellulitis of left leg.   Subjective:   Abdominal pain better.  Remains afebrile.   Assessment  & Plan :    Principal Problem:   Sepsis (HCC) Suspect secondary to left leg cellulitis.  Sepsis resolved. Continue empiric IV clindamycin.   blood cultures negative for growth so far. Supportive care with Tylenol and Ultracet for pain.  Avoid NSAIDs.  Continue IV fluids.  Flu PCR negative.  UA negative for infection.    Active Problems:   DM (diabetes mellitus), type 2, uncontrolled  with hyperglycemia (HCC) Continue home dose Lantus with pre-meal aspart 3 times daily.  A1c of 10.7 and chronically uncontrolled.  Needs counseling on diet and medication adherence.    Morbid obesity (HCC) Counseled on diet and exercise to lose weight.  Ongoing abdominal pain. Persistent symptoms since 2 months.  CT abdomen unremarkable.     Appreciate  GI consult.  Recommend  this is either musculoskeletal or possibly related to gastroparesis and reflux.  Recommend twice daily PPI, PRN Zofran and no further workup at this time.   Instructed to avoid NSAIDs.     Hypokalemia Replenished   Code Status : Full code  Family Communication  : None at bedside  Disposition Plan  : Home in 1-2 days if cellulitis improves  Barriers For Discharge : Active symptoms  Consults  : GI  Procedures  : CT abdomen and pelvis  DVT Prophylaxis  :  Lovenox -   Lab Results  Component Value Date   PLT 130 (L) 08/12/2017    Antibiotics  :   Anti-infectives (From admission, onward)   Start     Dose/Rate Route Frequency Ordered Stop   08/11/17 0300  clindamycin (CLEOCIN) IVPB 600 mg     600 mg 100 mL/hr over 30 Minutes Intravenous Every 8 hours 08/11/17 0117     08/11/17 0030  clindamycin (CLEOCIN) IVPB 600 mg     600 mg 100 mL/hr over 30 Minutes Intravenous  Once 08/11/17 0027 08/11/17 0201        Objective:   Vitals:   08/11/17 0947 08/11/17 1644 08/11/17 2100 08/12/17 0555  BP: (!) 147/94 (!) 104/56 122/74 132/82  Pulse: (!) 103 92 91 94  Resp: (!) 22 20 18 16   Temp: 99.5 F (37.5 C) 98.5 F (36.9 C) 98.6 F (37 C) 99.1 F (37.3 C)  TempSrc: Oral Oral Oral Oral  SpO2: 100% 99% 100% 95%  Weight: (!) 242.2 kg (534 lb)     Height: 5\' 11"  (1.803 m)       Wt Readings from Last 3 Encounters:  08/11/17 (!) 242.2 kg (534 lb)  06/30/17 (!) 265.4 kg (585 lb)  04/20/17 (!) 234.6 kg (517 lb 3.2 oz)     Intake/Output Summary (Last 24 hours) at 08/12/2017 1315 Last data filed at  08/12/2017 0500 Gross per 24 hour  Intake 480 ml  Output -  Net 480 ml     Physical Exam General: Morbidly obese female not in distress HEENT: Moist mucosa, supple neck Chest: Clear bilaterally CVS: Normal S1 and S2, no murmurs GI: Soft, nondistended, bowel sounds present, no abdominal tenderness Musculoskeletal: Warm, lymphedema with significant erythema with posterior left thigh extending to the calf (improved from yesterday), nontender, no fluctuation or discharge      Data Review:    CBC Recent Labs  Lab 08/10/17 1901 08/11/17 0545 08/12/17 0602  WBC 14.6* 7.0 5.4  HGB 13.5 12.6 11.6*  HCT 41.9 39.6 36.2  PLT 185 148* 130*  MCV 87.8 88.0 87.9  MCH 28.3 28.0 28.2  MCHC 32.2 31.8 32.0  RDW 13.5 13.8 14.0  LYMPHSABS 0.7  --   --   MONOABS 0.3  --   --   EOSABS 0.0  --   --   BASOSABS 0.0  --   --     Chemistries  Recent Labs  Lab 08/10/17 1901 08/11/17 0545 08/12/17 0602  NA 134* 134* 136  K 3.6 3.2* 3.5  CL 101 103 104  CO2 22 21* 19*  GLUCOSE 316* 291* 189*  BUN 8 9 10   CREATININE 0.83 0.68 0.67  CALCIUM 8.8* 8.2* 8.3*  AST 23  --   --   ALT 27  --   --   ALKPHOS 80  --   --   BILITOT 1.1  --   --    ------------------------------------------------------------------------------------------------------------------ No results for input(s): CHOL,  HDL, LDLCALC, TRIG, CHOLHDL, LDLDIRECT in the last 72 hours.  Lab Results  Component Value Date   HGBA1C 10.7 (H) 08/11/2017   ------------------------------------------------------------------------------------------------------------------ No results for input(s): TSH, T4TOTAL, T3FREE, THYROIDAB in the last 72 hours.  Invalid input(s): FREET3 ------------------------------------------------------------------------------------------------------------------ No results for input(s): VITAMINB12, FOLATE, FERRITIN, TIBC, IRON, RETICCTPCT in the last 72 hours.  Coagulation profile No results for input(s):  INR, PROTIME in the last 168 hours.  No results for input(s): DDIMER in the last 72 hours.  Cardiac Enzymes No results for input(s): CKMB, TROPONINI, MYOGLOBIN in the last 168 hours.  Invalid input(s): CK ------------------------------------------------------------------------------------------------------------------ No results found for: BNP  Inpatient Medications  Scheduled Meds: . [START ON 08/13/2017] enoxaparin (LOVENOX) injection  100 mg Subcutaneous Q24H  . insulin aspart  0-20 Units Subcutaneous TID WC  . insulin aspart  0-5 Units Subcutaneous QHS  . insulin aspart  6 Units Subcutaneous TID WC  . insulin glargine  55 Units Subcutaneous QHS  . ondansetron (ZOFRAN) IV  4 mg Intravenous TID WC & HS  . pantoprazole  40 mg Oral BID AC   Continuous Infusions: . clindamycin (CLEOCIN) IV Stopped (08/12/17 1130)   PRN Meds:.acetaminophen **OR** acetaminophen, hydrALAZINE, ondansetron **OR** ondansetron (ZOFRAN) IV, traMADol-acetaminophen  Micro Results Recent Results (from the past 240 hour(s))  Urine culture     Status: Abnormal   Collection Time: 08/10/17  8:06 PM  Result Value Ref Range Status   Specimen Description   Final    URINE, RANDOM Performed at [facility], [address], [place], [place] [address]    Special Requests   Final    NONE Performed at [facility], [address] [place], [place] [address]    Culture (A)  Final    <10,000 COLONIES/mL INSIGNIFICANT GROWTH Performed at [facility], [address] [place], [place] [address]    Report Status 08/12/2017 FINAL  Final  Culture, blood (routine x 2)     Status: None (Preliminary result)   Collection Time: 08/10/17  8:38 PM  Result Value Ref Range Status   Specimen Description RIGHT ANTECUBITAL  Final   Special Requests   Final    BOTTLES DRAWN AEROBIC AND ANAEROBIC Blood Culture adequate volume   Culture   Final    NO GROWTH 2 DAYS Performed at [facility], [address] [place],  [place] [address]    Report Status PENDING  Incomplete  Culture, blood (routine x 2)     Status: None (Preliminary result)   Collection Time: 08/10/17  8:42 PM  Result Value Ref Range Status   Specimen Description RIGHT ANTECUBITAL  Final   Special Requests   Final    BOTTLES DRAWN AEROBIC ONLY Blood Culture adequate volume   Culture   Final    NO GROWTH 2 DAYS Performed at [facility], [address], [place], [place] [address]    Report Status PENDING  Incomplete    Radiology Reports Dg Chest 2 View  Result Date: 08/10/2017 CLINICAL DATA:  Fever and tachycardia. EXAM: CHEST  2 VIEW COMPARISON:  January 06, 2017 FINDINGS: Haziness over the lung bases consistent with overlapping soft tissues. The heart, hila, mediastinum, lungs, and pleura demonstrate no acute abnormalities. IMPRESSION: No active cardiopulmonary disease. Electronically Signed   By: Gerome Sam III M.D   On: 08/10/2017 20:33   Ct Abdomen Pelvis W Contrast  Result Date: 08/10/2017 CLINICAL DATA:  37 y/o  F; abdominal pain, emesis, and fever. EXAM: CT ABDOMEN AND PELVIS  WITH CONTRAST TECHNIQUE: Multidetector CT imaging of the abdomen and pelvis was performed using the standard protocol following bolus administration of intravenous contrast. CONTRAST:  100mL ISOVUE-300 IOPAMIDOL (ISOVUE-300) INJECTION 61% COMPARISON:  01/26/2017 CT abdomen and pelvis. FINDINGS: Lower chest: Right lower lobe pulmonary nodule has triangular configuration, likely perifissural nodule, intrapulmonary lymph node. Hepatobiliary: Stable hepatomegaly. No focal liver lesion. No biliary ductal dilatation. Cholelithiasis. Pancreas: Fatty atrophy, otherwise unremarkable. No pancreatic ductal dilatation or surrounding inflammatory changes. Spleen: Splenomegaly 16.1 x 9.8 x 21.3 cm (volume = 1800 cm^3), previously 15.2 x 10.1 x 19.6 (volume = 1580) . Adrenals/Urinary Tract: Adrenal glands are unremarkable. Kidneys are normal, without renal calculi, focal lesion, or  hydronephrosis. Bladder is unremarkable. Stomach/Bowel: Stomach is within normal limits. Appendix appears normal. No evidence of bowel wall thickening, distention, or inflammatory changes. Vascular/Lymphatic: Left external iliac and pelvic sidewall lymph nodes are increased in size: Left external iliac, 19 mm, series 2: Image 95; left pelvic side wall, 16 mm, 3:93. Reproductive: Uterus and bilateral adnexa are unremarkable. IUD is no longer present. Other: No abdominal wall hernia or abnormality. No abdominopelvic ascites. Musculoskeletal: No acute or significant osseous findings. IMPRESSION: 1. No acute process identified. 2. Interval increase in left external iliac and pelvic sidewall lymph node size as well as splenomegaly. Evaluation for systemic infectious, inflammatory, or lymphoproliferative process is recommended. Electronically Signed   By: Mitzi HansenLance  Furusawa-Stratton M.D.   On: 08/10/2017 23:04    Time Spent in minutes  25   Elycia Woodside M.D on 08/12/2017 at 1:15 PM  Between 7am to 7pm - Pager - [phone]  After 7pm go to [email or web address] - password [email or web address]  [facility] -  Office  [phone]

## 2017-08-13 ENCOUNTER — Encounter (HOSPITAL_COMMUNITY): Payer: Self-pay | Admitting: Internal Medicine

## 2017-08-13 DIAGNOSIS — Z794 Long term (current) use of insulin: Secondary | ICD-10-CM

## 2017-08-13 DIAGNOSIS — E1165 Type 2 diabetes mellitus with hyperglycemia: Secondary | ICD-10-CM

## 2017-08-13 DIAGNOSIS — K219 Gastro-esophageal reflux disease without esophagitis: Secondary | ICD-10-CM

## 2017-08-13 HISTORY — DX: Gastro-esophageal reflux disease without esophagitis: K21.9

## 2017-08-13 LAB — GLUCOSE, CAPILLARY
Glucose-Capillary: 150 mg/dL — ABNORMAL HIGH (ref 65–99)
Glucose-Capillary: 186 mg/dL — ABNORMAL HIGH (ref 65–99)

## 2017-08-13 MED ORDER — CLINDAMYCIN HCL 300 MG PO CAPS: 600.0000 mg | ORAL_CAPSULE | Freq: Three times a day (TID) | ORAL | 0 refills | Status: AC

## 2017-08-13 MED ORDER — INSULIN GLARGINE 100 UNIT/ML ~~LOC~~ SOLN: 65.0000 [IU] | Freq: Every day | SUBCUTANEOUS | 11 refills | Status: DC

## 2017-08-13 MED ORDER — PANTOPRAZOLE SODIUM 40 MG PO TBEC: 40.0000 mg | DELAYED_RELEASE_TABLET | Freq: Two times a day (BID) | ORAL | 0 refills | Status: DC

## 2017-08-13 NOTE — Discharge Instructions (Signed)

## 2017-08-13 NOTE — Discharge Summary (Addendum)
Physician Discharge Summary  Brittany [name] ZOX:[id number] DOB: [birth year] DOA: 08/10/2017  PCP: Gillis Santa, MD  Admit date: 08/10/2017 Discharge date: 08/13/2017  Admitted From: Home Disposition: Home  Recommendations for Outpatient Follow-up:  1. Follow up with PCP in 1-2 weeks 2. She will complete total 10-day course of antibiotics (clindamycin) on 2/11 Schedule appointment with GI (Dr. fields) in 4 weeks if abdominal pain symptoms not resolved with PPI   Home Health: None Equipment/Devices: None  Discharge Condition: Fair CODE STATUS: Full code Diet recommendation: Carb modified    Discharge Diagnoses:  Principal Problem:   Sepsis (HCC)  Active Problems:   Morbid obesity (HCC)   Cellulitis of left lower extremity   Sleep apnea   Chronic abdominal pain   Pelvic lymphadenopathy   Splenomegaly   Non-intractable vomiting with nausea   GERD (gastroesophageal reflux disease)   Uncontrolled type 2 diabetes mellitus with hyperglycemia, with long-term current use of insulin (HCC)  Brief narrative/HPI Please refer to admission H&P for details, in brief,37 year old morbidly obese female with poorly controlled type 2 diabetes mellitus, OSA, going abdominal pain for past 2 months and recurrent lower extremity cellulitis with lymphedema presented with worsened left lower quadrant abdominal pain (worse in the morning with several episodes of nausea and vomiting along with subjective fever).  Denies any diarrhea, hematemesis or melena.  Reports taking plenty of NSAIDs for the pain.  She also noted pain with redness in her left leg. In the ED she was septic with fever of 102.9 F, WBC of 14.6 K and lactic acid of 2.3.  CT of the abdomen and pelvis with contrast showed lymphadenopathy with no other acute findings.  Chest x-ray unremarkable.  Patient given 2 L IV normal saline bolus and started on empiric IV clindamycin for sepsis secondary to cellulitis of left  leg.  Hospital course  Principal Problem:   Sepsis (HCC) Suspect secondary to left leg cellulitis.  Sepsis resolved. Placed on empiric IV clindamycin.   blood cultures negative for growth so far. Supportive care with Tylenol and Ultracet for pain.   avoiding NSAIDs.  Blood cultures and flu PCR negative. Symptoms improved today and no further pain.  I will discharge her on oral clindamycin to complete 10-day course of antibiotics.   Active Problems:   DM (diabetes mellitus), type 2, uncontrolled with hyperglycemia (HCC)   A1c of 10.7 and chronically uncontrolled.    Counseled extensively on diet, medication adherence and losing weight.  I will increase her Lantus dose to 65 units daily and continue sliding scale coverage at home.  Cellulitis of left leg As above. Has strong association with underlying uncontrolled diabetes contributing to recurrent infection.    Morbid obesity (HCC) Counseled on diet and exercise to lose weight.  Ongoing abdominal pain. Persistent symptoms since 2 months.  CT abdomen unremarkable.     Appreciate  GI consult.  Recommend this is either musculoskeletal or possibly related to gastroparesis +/- reflux.  Recommend twice daily PPI, PRN Zofran and no further workup at this time.   Instructed to avoid NSAIDs.    Apparent patient was taking ibuprofen regularly for the past 2 months. Follow-up with GI in 4 weeks if no improvement.   Hypokalemia Replenished     Family Communication  : None at bedside  Disposition Plan  : Home    Consults  : GI  Procedures  : CT abdomen and pelvis      Discharge Instructions   Allergies as of 08/13/2017  Reactions   Morphine And Related Other (See Comments)   "it just freaks me out"   Latex Itching, Swelling, Rash   Sulfa Antibiotics Nausea And Vomiting, Rash      Medication List    STOP taking these medications   ibuprofen 200 MG tablet Commonly known as:  ADVIL,MOTRIN   ondansetron 4  MG tablet Commonly known as:  ZOFRAN   ranitidine 150 MG tablet Commonly known as:  ZANTAC   traMADol 50 MG tablet Commonly known as:  ULTRAM     TAKE these medications   clindamycin 300 MG capsule Commonly known as:  CLEOCIN Take 2 capsules (600 mg total) by mouth 3 (three) times daily for 8 days.   insulin aspart 100 UNIT/ML injection Commonly known as:  novoLOG SLIDING SCALE 3 TIMES DAILY WITH MEALS: For blood sugar 70-120-no insulin. Blood sugar 121-150 take 5 units. Blood sugar 151-208 take 8 units. Blood sugar 201-250 take 12 units. Blood sugar 251-300 take 17 units. Blood sugar 301-350 take 20 units. Blood sugar 351-400 take 25 units. Blood sugar greater than 400 take 28 units and call your doctor.   insulin glargine 100 UNIT/ML injection Commonly known as:  LANTUS Inject 0.65 mLs (65 Units total) into the skin at bedtime. What changed:  how much to take   pantoprazole 40 MG tablet Commonly known as:  PROTONIX Take 1 tablet (40 mg total) by mouth 2 (two) times daily. What changed:    medication strength  how much to take  when to take this      Follow-up Information    Gillis Santa, MD. Schedule an appointment as soon as possible for a visit in 1 week(s).   Specialty:  Family Medicine Contact information: [address] [place] [address] [phone]        West Bali, MD. Schedule an appointment as soon as possible for a visit in 4 week(s).   Specialty:  Gastroenterology Contact information: [address] [place] [address] [phone]          Allergies  Allergen Reactions  . Morphine And Related Other (See Comments)    "it just freaks me out"  . Latex Itching, Swelling and Rash  . Sulfa Antibiotics Nausea And Vomiting and Rash     Procedures/Studies: Dg Chest 2 View  Result Date: 08/10/2017 CLINICAL DATA:  Fever and tachycardia. EXAM: CHEST  2 VIEW COMPARISON:  January 06, 2017 FINDINGS: Haziness over the  lung bases consistent with overlapping soft tissues. The heart, hila, mediastinum, lungs, and pleura demonstrate no acute abnormalities. IMPRESSION: No active cardiopulmonary disease. Electronically Signed   By: Gerome Sam III M.D   On: 08/10/2017 20:33   Ct Abdomen Pelvis W Contrast  Result Date: 08/10/2017 CLINICAL DATA:  37 y/o  F; abdominal pain, emesis, and fever. EXAM: CT ABDOMEN AND PELVIS WITH CONTRAST TECHNIQUE: Multidetector CT imaging of the abdomen and pelvis was performed using the standard protocol following bolus administration of intravenous contrast. CONTRAST:  ISOVUE-300 IOPAMIDOL (ISOVUE-300) INJECTION 61% COMPARISON:  01/26/2017 CT abdomen and pelvis. FINDINGS: Lower chest: Right lower lobe pulmonary nodule has triangular configuration, likely perifissural nodule, intrapulmonary lymph node. Hepatobiliary: Stable hepatomegaly. No focal liver lesion. No biliary ductal dilatation. Cholelithiasis. Pancreas: Fatty atrophy, otherwise unremarkable. No pancreatic ductal dilatation or surrounding inflammatory changes. Spleen: Splenomegaly 16.1 x 9.8 x 21.3 cm (volume = 1800 cm^3), previously 15.2 x 10.1 x 19.6 (volume = 1580) . Adrenals/Urinary Tract: Adrenal glands are unremarkable. Kidneys are  normal, without renal calculi, focal lesion, or hydronephrosis. Bladder is unremarkable. Stomach/Bowel: Stomach is within normal limits. Appendix appears normal. No evidence of bowel wall thickening, distention, or inflammatory changes. Vascular/Lymphatic: Left external iliac and pelvic sidewall lymph nodes are increased in size: Left external iliac, 19 mm, series 2: Image 95; left pelvic side wall, 16 mm, 3:93. Reproductive: Uterus and bilateral adnexa are unremarkable. IUD is no longer present. Other: No abdominal wall hernia or abnormality. No abdominopelvic ascites. Musculoskeletal: No acute or significant osseous findings. IMPRESSION: 1. No acute process identified. 2. Interval increase in left  external iliac and pelvic sidewall lymph node size as well as splenomegaly. Evaluation for systemic infectious, inflammatory, or lymphoproliferative process is recommended. Electronically Signed   By: Mitzi Hansen M.D.   On: 08/10/2017 23:04      Subjective: Left leg pain and swelling better.  Reports off-and-on pain in her left abdominal quadrant which is better overall.  Denies nausea and vomiting.  Discharge Exam: Vitals:   08/12/17 2100 08/13/17 0512  BP: 126/84 128/86  Pulse: 88 84  Resp: 16 16  Temp: 98.4 F (36.9 C) 98.4 F (36.9 C)  SpO2: 96% 98%   Vitals:   08/12/17 0555 08/12/17 1713 08/12/17 2100 08/13/17 0512  BP: 132/82 (!) 140/99 126/84 128/86  Pulse: 94 90 88 84  Resp: 16 16 16 16   Temp: 99.1 F (37.3 C) 98.1 F (36.7 C) 98.4 F (36.9 C) 98.4 F (36.9 C)  TempSrc: Oral Oral Oral Oral  SpO2: 95% 99% 96% 98%  Weight:      Height:        General: Morbidly obese female not in distress HEENT: Moist mucosa, supple neck Chest: Clear bilaterally CVS: Normal S1 and S2, no murmurs GI: Soft, nondistended, bowel sounds present, no abdominal tenderness Musculoskeletal: Warm, lymphedema with  erythema with posterior left thigh extending to the calf (much improved from prior days), nontender, no fluctuation or discharge      The results of significant diagnostics from this hospitalization (including imaging, microbiology, ancillary and laboratory) are listed below for reference.     Microbiology: Recent Results (from the past 240 hour(s))  Urine culture     Status: Abnormal   Collection Time: 08/10/17  8:06 PM  Result Value Ref Range Status   Specimen Description   Final    URINE, RANDOM Performed at [facility], [address] [place], [place] [address]    Special Requests   Final    NONE Performed at [facility], [address] [place], [place] [address]    Culture (A)  Final    <10,000 COLONIES/mL INSIGNIFICANT GROWTH Performed at  [facility], [address] [place], [place] [address]    Report Status 08/12/2017 FINAL  Final  Culture, blood (routine x 2)     Status: None (Preliminary result)   Collection Time: 08/10/17  8:38 PM  Result Value Ref Range Status   Specimen Description RIGHT ANTECUBITAL  Final   Special Requests   Final    BOTTLES DRAWN AEROBIC AND ANAEROBIC Blood Culture adequate volume   Culture   Final    NO GROWTH 3 DAYS Performed at [facility], [address], [place], [place] [address]    Report Status PENDING  Incomplete  Culture, blood (routine x 2)     Status: None (Preliminary result)   Collection Time: 08/10/17  8:42 PM  Result Value Ref Range Status   Specimen Description RIGHT ANTECUBITAL  Final  Special Requests   Final    BOTTLES DRAWN AEROBIC ONLY Blood Culture adequate volume   Culture   Final    NO GROWTH 3 DAYS Performed at [facility], [address], [place], [place] [address]    Report Status PENDING  Incomplete     Labs: BNP (last 3 results) No results for input(s): BNP in the last 8760 hours. Basic Metabolic Panel: Recent Labs  Lab 08/10/17 1901 08/11/17 0545 08/12/17 0602  NA 134* 134* 136  K 3.6 3.2* 3.5  CL 101 103 104  CO2 22 21* 19*  GLUCOSE 316* 291* 189*  BUN 8 9 10   CREATININE 0.83 0.68 0.67  CALCIUM 8.8* 8.2* 8.3*   Liver Function Tests: Recent Labs  Lab 08/10/17 1901  AST 23  ALT 27  ALKPHOS 80  BILITOT 1.1  PROT 7.5  ALBUMIN 3.4*   Recent Labs  Lab 08/10/17 1926  LIPASE 18   No results for input(s): AMMONIA in the last 168 hours. CBC: Recent Labs  Lab 08/10/17 1901 08/11/17 0545 08/12/17 0602  WBC 14.6* 7.0 5.4  NEUTROABS 13.7*  --   --   HGB 13.5 12.6 11.6*  HCT 41.9 39.6 36.2  MCV 87.8 88.0 87.9  PLT 185 148* 130*   Cardiac Enzymes: No results for input(s): CKTOTAL, CKMB, CKMBINDEX, TROPONINI in the last 168 hours. BNP: Invalid input(s): POCBNP CBG: Recent Labs  Lab 08/12/17 1137 08/12/17 1706  08/12/17 2100 08/13/17 0735 08/13/17 1135  GLUCAP 164* 168* 203* 150* 186*   D-Dimer No results for input(s): DDIMER in the last 72 hours. Hgb A1c Recent Labs    08/11/17 0541  HGBA1C 10.7*   Lipid Profile No results for input(s): CHOL, HDL, LDLCALC, TRIG, CHOLHDL, LDLDIRECT in the last 72 hours. Thyroid function studies No results for input(s): TSH, T4TOTAL, T3FREE, THYROIDAB in the last 72 hours.  Invalid input(s): FREET3 Anemia work up No results for input(s): VITAMINB12, FOLATE, FERRITIN, TIBC, IRON, RETICCTPCT in the last 72 hours. Urinalysis    Component Value Date/Time   COLORURINE YELLOW 08/10/2017 1852   APPEARANCEUR CLEAR 08/10/2017 1852   LABSPEC 1.020 08/10/2017 1852   PHURINE 7.0 08/10/2017 1852   GLUCOSEU >=500 (A) 08/10/2017 1852   HGBUR NEGATIVE 08/10/2017 1852   BILIRUBINUR NEGATIVE 08/10/2017 1852   KETONESUR NEGATIVE 08/10/2017 1852   PROTEINUR 30 (A) 08/10/2017 1852   UROBILINOGEN 0.2 02/22/2013 0414   NITRITE NEGATIVE 08/10/2017 1852   LEUKOCYTESUR NEGATIVE 08/10/2017 1852   Sepsis Labs Invalid input(s): PROCALCITONIN,  WBC,  LACTICIDVEN Microbiology Recent Results (from the past 240 hour(s))  Urine culture     Status: Abnormal   Collection Time: 08/10/17  8:06 PM  Result Value Ref Range Status   Specimen Description   Final    URINE, RANDOM Performed at [facility], [address], [place], [place] [address]    Special Requests   Final    NONE Performed at [facility], [address] [place], [place] [address]    Culture (A)  Final    <10,000 COLONIES/mL INSIGNIFICANT GROWTH Performed at [facility], [address], [place], [place] [address]    Report Status 08/12/2017 FINAL  Final  Culture, blood (routine x 2)     Status: None (Preliminary result)   Collection Time: 08/10/17  8:38 PM  Result Value Ref Range Status   Specimen Description RIGHT ANTECUBITAL  Final   Special Requests   Final    BOTTLES DRAWN AEROBIC AND  ANAEROBIC  Blood Culture adequate volume   Culture   Final    NO GROWTH 3 DAYS Performed at [facility], [address] [place], [place] [id number]    Report Status PENDING  Incomplete  Culture, blood (routine x 2)     Status: None (Preliminary result)   Collection Time: 08/10/17  8:42 PM  Result Value Ref Range Status   Specimen Description RIGHT ANTECUBITAL  Final   Special Requests   Final    BOTTLES DRAWN AEROBIC ONLY Blood Culture adequate volume   Culture   Final    NO GROWTH 3 DAYS Performed at [facility], [address] [place], [place] [address]    Report Status PENDING  Incomplete     Time coordinating discharge: Over 30 minutes  SIGNED:   Eddie NorthNishant Bladen Umar, MD  [facility] 08/13/2017, 11:52 AM Pager   If 7PM-7AM, please contact night-coverage [email or web address] Password [email or web address]

## 2017-08-15 LAB — CULTURE, BLOOD (ROUTINE X 2)
CULTURE: NO GROWTH
CULTURE: NO GROWTH
SPECIAL REQUESTS: ADEQUATE
SPECIAL REQUESTS: ADEQUATE

## 2017-08-22 ENCOUNTER — Other Ambulatory Visit: Payer: Self-pay

## 2017-08-22 ENCOUNTER — Inpatient Hospital Stay (HOSPITAL_COMMUNITY): Payer: Medicaid Other | Attending: Internal Medicine | Admitting: Internal Medicine

## 2017-08-22 ENCOUNTER — Inpatient Hospital Stay (HOSPITAL_COMMUNITY): Payer: Medicaid Other

## 2017-08-22 ENCOUNTER — Encounter (HOSPITAL_COMMUNITY): Payer: Self-pay | Admitting: Internal Medicine

## 2017-08-22 ENCOUNTER — Encounter (HOSPITAL_COMMUNITY): Payer: Self-pay | Admitting: *Deleted

## 2017-08-22 VITALS — BP 192/90 | HR 85 | Temp 97.5°F | Resp 18 | Ht 71.0 in | Wt >= 6400 oz

## 2017-08-22 DIAGNOSIS — R591 Generalized enlarged lymph nodes: Secondary | ICD-10-CM

## 2017-08-22 DIAGNOSIS — L03116 Cellulitis of left lower limb: Secondary | ICD-10-CM | POA: Diagnosis not present

## 2017-08-22 DIAGNOSIS — E119 Type 2 diabetes mellitus without complications: Secondary | ICD-10-CM

## 2017-08-22 DIAGNOSIS — R161 Splenomegaly, not elsewhere classified: Secondary | ICD-10-CM

## 2017-08-22 DIAGNOSIS — R590 Localized enlarged lymph nodes: Secondary | ICD-10-CM | POA: Diagnosis present

## 2017-08-22 DIAGNOSIS — G4733 Obstructive sleep apnea (adult) (pediatric): Secondary | ICD-10-CM | POA: Insufficient documentation

## 2017-08-22 DIAGNOSIS — E1165 Type 2 diabetes mellitus with hyperglycemia: Secondary | ICD-10-CM | POA: Insufficient documentation

## 2017-08-22 DIAGNOSIS — R1032 Left lower quadrant pain: Secondary | ICD-10-CM | POA: Insufficient documentation

## 2017-08-22 DIAGNOSIS — I890 Lymphedema, not elsewhere classified: Secondary | ICD-10-CM | POA: Diagnosis present

## 2017-08-22 LAB — CBC WITH DIFFERENTIAL/PLATELET
BASOS ABS: 0 10*3/uL (ref 0.0–0.1)
BASOS PCT: 0 %
EOS ABS: 0.1 10*3/uL (ref 0.0–0.7)
Eosinophils Relative: 1 %
HEMATOCRIT: 41.5 % (ref 36.0–46.0)
HEMOGLOBIN: 13.4 g/dL (ref 12.0–15.0)
Lymphocytes Relative: 23 %
Lymphs Abs: 1.7 10*3/uL (ref 0.7–4.0)
MCH: 28.2 pg (ref 26.0–34.0)
MCHC: 32.3 g/dL (ref 30.0–36.0)
MCV: 87.4 fL (ref 78.0–100.0)
Monocytes Absolute: 0.4 10*3/uL (ref 0.1–1.0)
Monocytes Relative: 6 %
NEUTROS PCT: 70 %
Neutro Abs: 5.2 10*3/uL (ref 1.7–7.7)
Platelets: 215 10*3/uL (ref 150–400)
RBC: 4.75 MIL/uL (ref 3.87–5.11)
RDW: 13.4 % (ref 11.5–15.5)
WBC: 7.5 10*3/uL (ref 4.0–10.5)

## 2017-08-22 LAB — COMPREHENSIVE METABOLIC PANEL
ALBUMIN: 3.6 g/dL (ref 3.5–5.0)
ALK PHOS: 79 U/L (ref 38–126)
ALT: 20 U/L (ref 14–54)
ANION GAP: 9 (ref 5–15)
AST: 19 U/L (ref 15–41)
BILIRUBIN TOTAL: 0.9 mg/dL (ref 0.3–1.2)
BUN: 14 mg/dL (ref 6–20)
CALCIUM: 8.9 mg/dL (ref 8.9–10.3)
CO2: 21 mmol/L — AB (ref 22–32)
Chloride: 102 mmol/L (ref 101–111)
Creatinine, Ser: 0.63 mg/dL (ref 0.44–1.00)
GFR calc Af Amer: >60 mL/min (ref >60)
GFR calc non Af Amer: >60 mL/min (ref >60)
GLUCOSE: 197 mg/dL — AB (ref 65–99)
Potassium: 3.9 mmol/L (ref 3.5–5.1)
SODIUM: 132 mmol/L — AB (ref 135–145)
TOTAL PROTEIN: 8 g/dL (ref 6.5–8.1)

## 2017-08-22 LAB — APTT: aPTT: 29 seconds (ref 24–36)

## 2017-08-22 LAB — LACTATE DEHYDROGENASE: LDH: 157 U/L (ref 98–192)

## 2017-08-22 LAB — PROTIME-INR
INR: 1.09
Prothrombin Time: 14 seconds (ref 11.4–15.2)

## 2017-08-22 NOTE — Patient Instructions (Signed)
[facility] at [facility] Discharge Instructions  RECOMMENDATIONS MADE BY THE CONSULTANT AND ANY TEST RESULTS WILL BE SENT TO YOUR REFERRING PHYSICIAN.  You were seen by Dr. Minta BalsamVetta Higgs Blood work today  Follow up in 2 weeks and we will go over your results at that time.  Thank you for choosing [facility] at [facility] to provide your oncology and hematology care.  To afford each patient quality time with our provider, please arrive at least 15 minutes before your scheduled appointment time.    If you have a lab appointment with the Cancer Center please come in thru the  Main Entrance and check in at the main information desk  You need to re-schedule your appointment should you arrive 10 or more minutes late.  We strive to give you quality time with our providers, and arriving late affects you and other patients whose appointments are after yours.  Also, if you no show three or more times for appointments you may be dismissed from the clinic at the providers discretion.     Again, thank you for choosing [facility].  Our hope is that these requests will decrease the amount of time that you wait before being seen by our physicians.       _____________________________________________________________  Should you have questions after your visit to [facility], please contact our office at [phone] between the hours of 8:30 a.m. and 4:30 p.m.  Voicemails left after 4:30 p.m. will not be returned until the following business day.  For prescription refill requests, have your pharmacy contact our office.       Resources For Cancer Patients and their Caregivers ? American Cancer Society: Can assist with transportation, wigs, general needs, runs Look Good Feel Better.        [phone] ? Cancer Care: Provides financial assistance, online support groups, medication/co-pay assistance.  1-800-813-HOPE 510-443-9379(4673) ? [facility] Assists [place] cancer patients and their families through emotional , educational and financial support.  [phone] [phone] ? [place] DSS Where to apply for food stamps, Medicaid and utility assistance. 5403297617667-126-7346 ? [facility]: Transportation to medical appointments. 804-246-2928919 593 8686 ? Social Security Administration: May apply for disability if have a Stage IV cancer. [phone] 601-565-15521-205-076-3948 ? [place] [facility], [employer] and [employer] Services: Assists with nutrition, care and transit needs. (516) [phone]  Cancer Center Support Programs: @10RELATIVEDAYS @ > Cancer Support Group  2nd Tuesday of the month 1pm-2pm, Journey Room  > Creative Journey  3rd Tuesday of the month 1130am-1pm, Journey Room  > Look Good Feel Better  1st Wednesday of the month 10am-12 noon, Journey Room (Call American Cancer Society to register ([phone])

## 2017-08-23 LAB — HEPATITIS PANEL, ACUTE
HCV AB: 0.3 {s_co_ratio} (ref 0.0–0.9)
HEP A IGM: NEGATIVE
HEP B C IGM: NEGATIVE
HEP B S AG: NEGATIVE

## 2017-08-23 LAB — HEPATITIS B SURFACE ANTIGEN: HEP B S AG: NEGATIVE

## 2017-08-23 LAB — HIV ANTIBODY (ROUTINE TESTING W REFLEX): HIV SCREEN 4TH GENERATION: NONREACTIVE

## 2017-08-23 LAB — HEPATITIS B SURFACE ANTIBODY,QUALITATIVE: HEP B S AB: NONREACTIVE

## 2017-08-24 ENCOUNTER — Ambulatory Visit: Payer: Medicaid Other | Admitting: Nurse Practitioner

## 2017-09-04 NOTE — Progress Notes (Signed)
Diagnosis Lymphadenopathy - Plan: CBC with Differential/Platelet, Comprehensive metabolic panel, Lactate dehydrogenase, Protime-INR, HIV antibody (with reflex), APTT, Hepatitis B surface antibody, Hepatitis B surface antigen, Hepatitis panel, acute, Hepatitis C RNA quantitative, Sedimentation rate, C-reactive protein  Staging Cancer Staging No matching staging information was found for the patient.  Assessment and plan:  1.  Adenopathy/splenomegaly.  37 year old morbidly obese female with poorly controlled type 2 diabetes mellitus, OSA, going abdominal pain for past 2 months and recurrent lower extremity cellulitis with lymphedema presented with worsened left lower quadrant abdominal pain (worse in the morning with several episodes of nausea and vomiting along with subjective fever). Denies any diarrhea, hematemesis or melena. Reports taking plenty of NSAIDs for the pain. She also noted pain with redness in her left leg.  In the ED she was septic with fever of 102.9 F, WBC of 14.6 K and lactic acid of 2.3. CT of the abdomen and pelvis with contrast showed lymphadenopathy with no other acute findings. Chest x-ray unremarkable. Patient was treated with empiric IV clindamycin for sepsis secondary to cellulitis of left leg.  She was discharged home with po Clindamycin.    Pt has not been taking abx as recommended.  She is advised to complete course of abx recommended.  CT done 08/10/2017 shows nonspecific findings but no acute process identified.  She has interval increase in left external iliac and pelvic sidewall lymph node size as well as splenomegaly. Evaluation for systemic infectious, inflammatory, or lymphoproliferative process is Recommended.  Labs done today are WNL with WBC 7.5 HB 13 plts 215,000.  She will RTC in 2 weeks for followup.  I suspect the scan findings are reactive based on cellulitis.  She will be set up for repeat imaging in 3 months for interval followup.   2. Cellulitis.   She is afebrile.  She has not been taking abx as recommended.  She should continue abx as recommended and followup with PCP if no improvement in symptoms.    3.  Morbid Obesity.  She is referred to Weight management clinic.    4.  DM.  Continue to followup with PCP for management.    HPI:  37 year old morbidly obese female with poorly controlled type 2 diabetes mellitus, OSA, going abdominal pain for past 2 months and recurrent lower extremity cellulitis with lymphedema presented with worsened left lower quadrant abdominal pain (worse in the morning with several episodes of nausea and vomiting along with subjective fever). Denies any diarrhea, hematemesis or melena. Reports taking plenty of NSAIDs for the pain. She also noted pain with redness in her left leg. In the ED she was septic with fever of 102.9 F, WBC of 14.6 K and lactic acid of 2.3. CT of the abdomen and pelvis with contrast showed lymphadenopathy with no other acute findings. Chest x-ray unremarkable. Patient was treated with empiric IV clindamycin for sepsis secondary to cellulitis of left leg.  She was discharged home with po Clindamycin.    Pt reports she has not been taking abx as recommended.  She is here for consultation due to cellulitis and scan findings of adenopathy.     Problem List Patient Active Problem List   Diagnosis Date Noted  . GERD (gastroesophageal reflux disease) [K21.9] 08/13/2017  . Uncontrolled type 2 diabetes mellitus with hyperglycemia, with long-term current use of insulin ([place]) [E11.65, Z79.4] 08/13/2017  . Sepsis ([place]) [A41.9] 08/11/2017  . Chronic abdominal pain [R10.9, G89.29]   . Pelvic lymphadenopathy [R59.0]   . Splenomegaly [R16.1]   .  Non-intractable vomiting with nausea [R11.2]   . Encounter for IUD removal [Z30.432] 04/20/2017  . Pressure injury of skin [L89.90] 10/24/2016  . Cellulitis and abscess of leg [L03.119, L02.419] 10/23/2016  . Sleep apnea [G47.30] 10/23/2016  . Diabetes  mellitus without complication ([place]) [C16.3] 10/23/2016  . Morbid obesity with body mass index (BMI) greater than or equal to 70 in adult [facility]) [E66.01, Z68.45] 10/23/2016  . Carpal tunnel syndrome on left [G56.02] 10/12/2016  . Candidal intertrigo [B37.2] 12/31/2014  . Morbid obesity ([place]) [E66.01] 12/29/2014  . Hyponatremia [E87.1] 12/29/2014  . Cellulitis of left lower extremity [L03.116] 12/29/2014    Past Medical History Past Medical History:  Diagnosis Date  . Anxiety   . Carpal tunnel syndrome on left 10/12/2016  . Cellulitis and abscess of leg   . Chronic abdominal pain   . Depression   . Diabetes mellitus without complication ([place])   . Gallstones   . GERD (gastroesophageal reflux disease) 08/13/2017  . Headache   . Lymphedema   . Medical history non-contributory   . Sleep apnea    DX with sleep apnea - unable to use c-pap  . Umbilical hernia     Past Surgical History Past Surgical History:  Procedure Laterality Date  . CESAREAN SECTION  11/09/01  . HERNIA REPAIR    . INSERTION OF MESH N/A 01/02/2013   Procedure: INSERTION OF MESH;  Surgeon: Madilyn Hook, DO;  Location: [facility];  Service: General;  Laterality: N/A;  . UMBILICAL HERNIA REPAIR N/A 01/02/2013   Procedure: LAPAROSCOPIC UMBILICAL HERNIA;  Surgeon: Madilyn Hook, DO;  Location: [facility];  Service: General;  Laterality: N/A;    Family History Family History  Problem Relation Age of Onset  . Cancer Mother        leukemia  . Diabetes Mother   . Hypertension Mother   . Hyperlipidemia Mother   . Other Father        Sciatica, kidney stones  . Alcohol abuse Paternal Grandfather   . Other Paternal Grandmother        bowel obstruction; pneumonia  . Hypertension Maternal Grandmother   . Diabetes Maternal Grandmother   . Cancer Maternal Grandfather        lung  . ADD / ADHD Daughter   . Depression Daughter   . Anxiety disorder Daughter      Social History  reports that  has never smoked. she has never used  smokeless tobacco. She reports that she does not drink alcohol or use drugs.  Medications  Current Outpatient Medications:  .  insulin aspart (NOVOLOG) 100 UNIT/ML injection, SLIDING SCALE 3 TIMES DAILY WITH MEALS: For blood sugar 70-120-no insulin. Blood sugar 121-150 take 5 units. Blood sugar 151-208 take 8 units. Blood sugar 201-250 take 12 units. Blood sugar 251-300 take 17 units. Blood sugar 301-350 take 20 units. Blood sugar 351-400 take 25 units. Blood sugar greater than 400 take 28 units and call your doctor., Disp: 10 mL, Rfl: 0 .  insulin glargine (LANTUS) 100 UNIT/ML injection, Inject 0.65 mLs (65 Units total) into the skin at bedtime., Disp: 10 mL, Rfl: 11  Allergies Morphine and related; Latex; and Sulfa antibiotics  Review of Systems Review of Systems - Oncology ROS as per HPI otherwise 12 point ROS is negative other than fatigue.     Physical Exam  Vitals Wt Readings from Last 3 Encounters:  08/22/17 (!) 527 lb 8 oz (239.3 kg)  08/11/17 (!) 534 lb (242.2 kg)  06/30/17 Marland Kitchen)  585 lb (265.4 kg)   Temp Readings from Last 3 Encounters:  08/22/17 (!) 97.5 F (36.4 C) (Oral)  08/13/17 98.4 F (36.9 C) (Oral)  07/24/17 97.7 F (36.5 C) (Oral)   BP Readings from Last 3 Encounters:  08/22/17 (!) 192/90  08/13/17 128/86  07/25/17 122/79   Pulse Readings from Last 3 Encounters:  08/22/17 85  08/13/17 84  07/25/17 80   Constitutional: Morbidly obese female in no distress.   HENT: Head: Normocephalic and atraumatic.  Mouth/Throat: No oropharyngeal exudate. Mucosa moist. Eyes: Pupils are equal, round, and reactive to light. Conjunctivae are normal. No scleral icterus.  Neck: Normal range of motion. Neck supple. No JVD present.  Cardiovascular: Normal rate, regular rhythm and normal heart sounds.  Exam reveals no gallop and no friction rub.   No murmur heard. Pulmonary/Chest: Effort normal and breath sounds normal. No respiratory distress. No wheezes.No rales.   Abdominal: Soft. Bowel sounds are normal. No distension. There is no tenderness. There is no guarding.  Musculoskeletal: No edema or tenderness.  Lymphadenopathy: No cervical, axillary or supraclavicular adenopathy.  Neurological: Alert and oriented to person, place, and time. No cranial nerve deficit.  Skin: Skin is warm and dry. No rash noted. Evidence of resolving erythema on Left LE.   Psychiatric: Affect and judgment normal.   Labs Appointment on 08/22/2017  Component Date Value Ref Range Status  . WBC 08/22/2017 7.5  4.0 - 10.5 K/uL Final  . RBC 08/22/2017 4.75  3.87 - 5.11 MIL/uL Final  . Hemoglobin 08/22/2017 13.4  12.0 - 15.0 g/dL Final  . HCT 08/22/2017 41.5  36.0 - 46.0 % Final  . MCV 08/22/2017 87.4  78.0 - 100.0 fL Final  . MCH 08/22/2017 28.2  26.0 - 34.0 pg Final  . MCHC 08/22/2017 32.3  30.0 - 36.0 g/dL Final  . RDW 08/22/2017 13.4  11.5 - 15.5 % Final  . Platelets 08/22/2017 215  150 - 400 K/uL Final  . Neutrophils Relative % 08/22/2017 70  % Final  . Neutro Abs 08/22/2017 5.2  1.7 - 7.7 K/uL Final  . Lymphocytes Relative 08/22/2017 23  % Final  . Lymphs Abs 08/22/2017 1.7  0.7 - 4.0 K/uL Final  . Monocytes Relative 08/22/2017 6  % Final  . Monocytes Absolute 08/22/2017 0.4  0.1 - 1.0 K/uL Final  . Eosinophils Relative 08/22/2017 1  % Final  . Eosinophils Absolute 08/22/2017 0.1  0.0 - 0.7 K/uL Final  . Basophils Relative 08/22/2017 0  % Final  . Basophils Absolute 08/22/2017 0.0  0.0 - 0.1 K/uL Final   Performed at [facility], [address], [place], [place] [address]  . Sodium 08/22/2017 132* 135 - 145 mmol/L Final  . Potassium 08/22/2017 3.9  3.5 - 5.1 mmol/L Final  . Chloride 08/22/2017 102  101 - 111 mmol/L Final  . CO2 08/22/2017 21* 22 - 32 mmol/L Final  . Glucose, Bld 08/22/2017 197* 65 - 99 mg/dL Final  . BUN 08/22/2017 14  6 - 20 mg/dL Final  . Creatinine, Ser 08/22/2017 0.63  0.44 - 1.00 mg/dL Final  . Calcium 08/22/2017 8.9  8.9 - 10.3 mg/dL Final   . Total Protein 08/22/2017 8.0  6.5 - 8.1 g/dL Final  . Albumin 08/22/2017 3.6  3.5 - 5.0 g/dL Final  . AST 08/22/2017 19  15 - 41 U/L Final  . ALT 08/22/2017 20  14 - 54 U/L Final  . Alkaline Phosphatase 08/22/2017 79  38 - 126 U/L  Final  . Total Bilirubin 08/22/2017 0.9  0.3 - 1.2 mg/dL Final  . GFR calc non Af Amer 08/22/2017 >60  >60 mL/min Final  . GFR calc Af Amer 08/22/2017 >60  >60 mL/min Final   Comment: (NOTE) The eGFR has been calculated using the CKD EPI equation. This calculation has not been validated in all clinical situations. eGFR's persistently <60 mL/min signify possible Chronic Kidney Disease.   Georgiann Hahn gap 08/22/2017 9  5 - 15 Final   Performed at [facility], [address] [place], [place] [address]  . LDH 08/22/2017 157  98 - 192 U/L Final   Performed at [facility], [address], [place], [place] [address]  . Prothrombin Time 08/22/2017 14.0  11.4 - 15.2 seconds Final  . INR 08/22/2017 1.09   Final   Performed at [facility], [address], [place], [place] [address]  . aPTT 08/22/2017 29  24 - 36 seconds Final   Performed at [facility], [address] [place], [place] [address]  . Hep B S Ab 08/22/2017 Non Reactive   Final   Comment: (NOTE)              Non Reactive: Inconsistent with immunity,                            less than 10 mIU/mL              Reactive:     Consistent with immunity,                            greater than 9.9 mIU/mL Performed At: [facility] [place], [place] [id number] Rush Farmer MD [id number] Performed at [facility], [address] [place], [place] [address]   . Hepatitis B Surface Ag 08/22/2017 Negative  Negative Final   Comment: (NOTE) Performed At: [facility] [place], [place] [id number] Rush Farmer MD [id number] Performed at [facility], [address] [place], [place] [address]   . Hepatitis B Surface Ag 08/22/2017 Negative  Negative Final  . HCV  Ab 08/22/2017 0.3  0.0 - 0.9 s/co ratio Final   Comment: (NOTE)                                  Negative:     < 0.8                             Indeterminate: 0.8 - 0.9                                  Positive:     > 0.9 The CDC recommends that a positive HCV antibody result be followed up with a HCV Nucleic Acid Amplification test (672094). Performed At: [facility] [place], [place] [id number] Rush Farmer MD [id number]   . Hep A IgM 08/22/2017 Negative  Negative Final  . Hep B C IgM 08/22/2017 Negative  Negative Final   Performed at [facility], [address] [place], [place] [address]  Office Visit on 08/22/2017  Component Date Value Ref Range Status  . HIV Screen 4th Generation wRfx 08/22/2017 Non Reactive  Non Reactive Final  Comment: (NOTE) Performed At: [facility] [place], [place] [id number] Rush Farmer MD SW:[id number] Performed at [facility], [address], [place], [place] [address]     CT abdomen and pelvis done 08/10/2017:  IMPRESSION: 1. No acute process identified. 2. Interval increase in left external iliac and pelvic sidewall lymph node size as well as splenomegaly. Evaluation for systemic infectious, inflammatory, or lymphoproliferative process is recommended.  Orders Placed This Encounter  Procedures  . CBC with Differential/Platelet    Standing Status:   Future    Number of Occurrences:   1    Standing Expiration Date:   08/22/2018  . Comprehensive metabolic panel    Standing Status:   Future    Number of Occurrences:   1    Standing Expiration Date:   08/22/2018  . Lactate dehydrogenase    Standing Status:   Future    Number of Occurrences:   1    Standing Expiration Date:   08/22/2018  . Protime-INR    Standing Status:   Future    Number of Occurrences:   1    Standing Expiration Date:   08/22/2018  . HIV antibody (with reflex)  . APTT    Standing Status:   Future    Number of  Occurrences:   1    Standing Expiration Date:   08/22/2018  . Hepatitis B surface antibody    Standing Status:   Future    Number of Occurrences:   1    Standing Expiration Date:   08/22/2018  . Hepatitis B surface antigen    Standing Status:   Future    Number of Occurrences:   1    Standing Expiration Date:   08/22/2018  . Hepatitis panel, acute    Standing Status:   Future    Number of Occurrences:   1    Standing Expiration Date:   08/22/2018  . Hepatitis C RNA quantitative    Standing Status:   Future    Number of Occurrences:   1    Standing Expiration Date:   08/22/2018  . Sedimentation rate    Standing Status:   Future    Standing Expiration Date:   08/22/2018  . C-reactive protein    Standing Status:   Future    Standing Expiration Date:   08/22/2018    Zoila Shutter MD

## 2017-09-06 ENCOUNTER — Inpatient Hospital Stay (HOSPITAL_BASED_OUTPATIENT_CLINIC_OR_DEPARTMENT_OTHER): Payer: Medicaid Other | Admitting: Adult Health

## 2017-09-06 ENCOUNTER — Encounter (HOSPITAL_COMMUNITY): Payer: Self-pay | Admitting: Adult Health

## 2017-09-06 ENCOUNTER — Other Ambulatory Visit: Payer: Self-pay

## 2017-09-06 VITALS — BP 139/93 | HR 93 | Temp 98.6°F | Resp 16

## 2017-09-06 DIAGNOSIS — G4733 Obstructive sleep apnea (adult) (pediatric): Secondary | ICD-10-CM | POA: Diagnosis not present

## 2017-09-06 DIAGNOSIS — R161 Splenomegaly, not elsewhere classified: Secondary | ICD-10-CM | POA: Diagnosis not present

## 2017-09-06 DIAGNOSIS — R1032 Left lower quadrant pain: Secondary | ICD-10-CM

## 2017-09-06 DIAGNOSIS — R590 Localized enlarged lymph nodes: Secondary | ICD-10-CM

## 2017-09-06 DIAGNOSIS — I890 Lymphedema, not elsewhere classified: Secondary | ICD-10-CM

## 2017-09-06 DIAGNOSIS — E1165 Type 2 diabetes mellitus with hyperglycemia: Secondary | ICD-10-CM | POA: Diagnosis not present

## 2017-09-06 DIAGNOSIS — L03116 Cellulitis of left lower limb: Secondary | ICD-10-CM | POA: Diagnosis not present

## 2017-09-06 DIAGNOSIS — R591 Generalized enlarged lymph nodes: Secondary | ICD-10-CM

## 2017-09-06 NOTE — Progress Notes (Signed)
[facility] [address] [place], [place] [address]   CLINIC:  Medical Oncology/Hematology  PCP:  Gillis SantaMontero, Heather-John H, MD [address] [place] [id number] [phone]   REASON FOR VISIT:  Follow-up for Lymphadenopathy  CURRENT THERAPY: Initial work-up    HISTORY OF PRESENT ILLNESS:  (from Dr. Melton AlarHiggs' note on 08/22/17)  Assessment and plan:  1.  Adenopathy/splenomegaly.  37 year old morbidly obese female with poorly controlled type 2 diabetes mellitus, OSA, going abdominal pain for past 2 months and recurrent lower extremity cellulitis with lymphedema presented with worsened left lower quadrant abdominal pain (worse in the morning with several episodes of nausea and vomiting along with subjective fever). Denies any diarrhea, hematemesis or melena. Reports taking plenty of NSAIDs for the pain. She also noted pain with redness in her left leg.  In the ED she was septic with fever of 102.9 F, WBC of 14.6 K and lactic acid of 2.3. CT of the abdomen and pelvis with contrast showed lymphadenopathy with no other acute findings. Chest x-ray unremarkable. Patient was treated with empiric IV clindamycin for sepsis secondary to cellulitis of left leg.  She was discharged home with po Clindamycin.    Pt has not been taking abx as recommended.  She is advised to complete course of abx recommended.  CT done 08/10/2017 shows nonspecific findings but no acute process identified.  She has interval increase in left external iliac and pelvic sidewall lymph node size as well as splenomegaly. Evaluation for systemic infectious, inflammatory, or lymphoproliferative process is Recommended.  Labs done today are WNL with WBC 7.5 HB 13 plts 215,000.  She will RTC in 2 weeks for followup.  I suspect the scan findings are reactive based on cellulitis.  She will be set up for repeat imaging in 3 months for interval followup.   2. Cellulitis.  She is afebrile.  She has not been  taking abx as recommended.  She should continue abx as recommended and followup with PCP if no improvement in symptoms.    3.  Morbid Obesity.  She is referred to Weight management clinic.    4.  DM.  Continue to followup with PCP for management.       INTERVAL HISTORY:  Ms. Freida Busmanllen 37 y.o. female returns for routine follow-up for adenopathy.   Here today with her significant other.   Overall, she tells me she has been feeling "okay." Appetite and energy levels both 75%.  She continues to have LUQ abd pain that radiates to her (L) side and (L) lower back.  She describes it to me as "a hot, sharp knife stabbing me in the back and trying to cut me in half all the way to the front of my body."  She was taking Ibuprofen for this pain, but has switched to Aleve. Tylenol was not helpful.   Her bowels are moving well. She continues to have constant N&V; she tells me it is difficult for her to know when she is full with eating.  She does have h/o gastroparesis and diabetes.    Her current symptoms started in 05/2017 and have been persistent. Endorses night sweats and hot flashes during the day. "I get hot and cold all the time. I think I might be going through early menopause because my mom did too."  She has not had a menstrual cycle since her Mirena IUD was removed in 04/2017. She tells me that her last Mirena was in for 13 years. She  is a patient at [facility] here in [place].   She was instructed at her last visit with Dr. Melton Alar to finish her course of oral antibiotics for recent cellulitis leg infection. She tells me she had initially stopped the antibiotics because the redness on her skin had improved and she didn't think she needed them. Then after meeting with Dr. Melton Alar, she tells me she resumed the Clindamycin and did complete the whole course of treatment as recommended.  No recurrent signs/symptoms of cellulitis.    Her PCP manages her diabetes. She tells me that her blood sugars  have been "running in the 200s which is good for me."    REVIEW OF SYSTEMS:  Review of Systems - Oncology Per HPI. Otherwise, 14-point ROS completed and negative except as stated above.     PAST MEDICAL/SURGICAL HISTORY:  Past Medical History:  Diagnosis Date  . Anxiety   . Carpal tunnel syndrome on left 10/12/2016  . Cellulitis and abscess of leg   . Chronic abdominal pain   . Depression   . Diabetes mellitus without complication (HCC)   . Gallstones   . GERD (gastroesophageal reflux disease) 08/13/2017  . Headache   . Lymphedema   . Medical history non-contributory   . Sleep apnea    DX with sleep apnea - unable to use c-pap  . Umbilical hernia    Past Surgical History:  Procedure Laterality Date  . CESAREAN SECTION  11/09/01  . HERNIA REPAIR    . INSERTION OF MESH N/A 01/02/2013   Procedure: INSERTION OF MESH;  Surgeon: Lodema Pilot, DO;  Location: [facility];  Service: General;  Laterality: N/A;  . UMBILICAL HERNIA REPAIR N/A 01/02/2013   Procedure: LAPAROSCOPIC UMBILICAL HERNIA;  Surgeon: Lodema Pilot, DO;  Location: [facility];  Service: General;  Laterality: N/A;     SOCIAL HISTORY:  Social History   Socioeconomic History  . Marital status: Married    Spouse name: Not on file  . Number of children: 1  . Years of education: 66  . Highest education level: Not on file  Social Needs  . Financial resource strain: Not on file  . Food insecurity - worry: Not on file  . Food insecurity - inability: Not on file  . Transportation needs - medical: Not on file  . Transportation needs - non-medical: Not on file  Occupational History  . Occupation: N/A  Tobacco Use  . Smoking status: Never Smoker  . Smokeless tobacco: Never Used  Substance and Sexual Activity  . Alcohol use: No  . Drug use: No  . Sexual activity: Yes    Birth control/protection: None    Comment: husband  Other Topics Concern  . Not on file  Social History Narrative   Lives at home w/ her husband and  daughter   Right-handed   Caffeine daily    FAMILY HISTORY:  Family History  Problem Relation Age of Onset  . Cancer Mother        leukemia  . Diabetes Mother   . Hypertension Mother   . Hyperlipidemia Mother   . Other Father        Sciatica, kidney stones  . Alcohol abuse Paternal Grandfather   . Other Paternal Grandmother        bowel obstruction; pneumonia  . Hypertension Maternal Grandmother   . Diabetes Maternal Grandmother   . Cancer Maternal Grandfather        lung  . ADD / ADHD Daughter   .  Depression Daughter   . Anxiety disorder Daughter     CURRENT MEDICATIONS:  Outpatient Encounter Medications as of 09/06/2017  Medication Sig  . insulin aspart (NOVOLOG) 100 UNIT/ML injection SLIDING SCALE 3 TIMES DAILY WITH MEALS: For blood sugar 70-120-no insulin. Blood sugar 121-150 take 5 units. Blood sugar 151-208 take 8 units. Blood sugar 201-250 take 12 units. Blood sugar 251-300 take 17 units. Blood sugar 301-350 take 20 units. Blood sugar 351-400 take 25 units. Blood sugar greater than 400 take 28 units and call your doctor.  . insulin glargine (LANTUS) 100 UNIT/ML injection Inject 0.65 mLs (65 Units total) into the skin at bedtime.   No facility-administered encounter medications on file as of 09/06/2017.     ALLERGIES:  Allergies  Allergen Reactions  . Morphine And Related Other (See Comments)    "it just freaks me out"  . Latex Itching, Swelling and Rash  . Sulfa Antibiotics Nausea And Vomiting and Rash     PHYSICAL EXAM:  ECOG Performance status: 2 - Symptomatic; requires assistance at times   Vitals:   09/06/17 1546  BP: (!) 139/93  Pulse: 93  Resp: 16  Temp: 98.6 F (37 C)  SpO2: 99%   There were no vitals filed for this visit.  Physical Exam  Constitutional: She is oriented to person, place, and time.  Morbidly obese female in no acute distress -Examined seated in wheelchair   HENT:  Head: Normocephalic.  Mouth/Throat: Oropharynx is clear and  moist. No oropharyngeal exudate.  Eyes: Conjunctivae are normal. Pupils are equal, round, and reactive to light. No scleral icterus.  Neck: Normal range of motion. Neck supple.  Cardiovascular: Normal rate and regular rhythm.  Pulmonary/Chest: Effort normal and breath sounds normal. No respiratory distress. She has no wheezes.  Abdominal: Soft. Bowel sounds are normal. There is tenderness (LUQ tenderness on palpation ).  Musculoskeletal: She exhibits edema (BLE ).  Lymphadenopathy:    She has no cervical adenopathy.    She has no axillary adenopathy.       Right: No supraclavicular adenopathy present.       Left: No supraclavicular adenopathy present.  Neurological: She is alert and oriented to person, place, and time.  Skin: Skin is warm and dry.  Psychiatric: Mood, memory, affect and judgment normal.  Nursing note and vitals reviewed.    LABORATORY DATA:  I have reviewed the labs as listed.  CBC    Component Value Date/Time   WBC 7.5 08/22/2017 1449   RBC 4.75 08/22/2017 1449   HGB 13.4 08/22/2017 1449   HCT 41.5 08/22/2017 1449   PLT 215 08/22/2017 1449   MCV 87.4 08/22/2017 1449   MCH 28.2 08/22/2017 1449   MCHC 32.3 08/22/2017 1449   RDW 13.4 08/22/2017 1449   LYMPHSABS 1.7 08/22/2017 1449   MONOABS 0.4 08/22/2017 1449   EOSABS 0.1 08/22/2017 1449   BASOSABS 0.0 08/22/2017 1449   CMP Latest Ref Rng & Units 08/22/2017 08/12/2017 08/11/2017  Glucose 65 - 99 mg/dL 161(W) 960(A) 540(J)  BUN 6 - 20 mg/dL 14 10 9   Creatinine 0.44 - 1.00 mg/dL 8.11 9.14 7.82  Sodium 135 - 145 mmol/L 132(L) 136 134(L)  Potassium 3.5 - 5.1 mmol/L 3.9 3.5 3.2(L)  Chloride 101 - 111 mmol/L 102 104 103  CO2 22 - 32 mmol/L 21(L) 19(L) 21(L)  Calcium 8.9 - 10.3 mg/dL 8.9 9.5(A) 2.1(H)  Total Protein 6.5 - 8.1 g/dL 8.0 - -  Total Bilirubin 0.3 - 1.2 mg/dL 0.9 - -  Alkaline Phos 38 - 126 U/L 79 - -  AST 15 - 41 U/L 19 - -  ALT 14 - 54 U/L 20 - -   Results for LOURINE, ALBERICO (MRN [id number])    Ref. Range 08/22/2017 14:49  LDH Latest Ref Range: 98 - 192 U/L 157  Results for LORA, CHAVERS (MRN [id number])   Ref. Range 08/22/2017 14:49  Prothrombin Time Latest Ref Range: 11.4 - 15.2 seconds 14.0  INR Unknown 1.09  APTT Latest Ref Range: 24 - 36 seconds 29  Results for CIELA, MAHAJAN (MRN [id number])   Ref. Range 08/22/2017 14:49  Hep A Ab, IgM Latest Ref Range: Negative  Negative  Hepatitis B Surface Ag Latest Ref Range: Negative  Negative  Hep B S Ab Unknown Non Reactive  Hep B Core Ab, IgM Latest Ref Range: Negative  Negative  HCV Ab Latest Ref Range: 0.0 - 0.9 s/co ratio 0.3  HIV Screen 4th Generation wRfx Latest Ref Range: Non Reactive  Non Reactive   PENDING LABS:    DIAGNOSTIC IMAGING:  *The following radiologic images and reports have been reviewed independently and agree with below findings.  CT abd/pelvis: 08/10/17 CLINICAL DATA:  37 y/o  F; abdominal pain, emesis, and fever.  EXAM: CT ABDOMEN AND PELVIS WITH CONTRAST  TECHNIQUE: Multidetector CT imaging of the abdomen and pelvis was performed using the standard protocol following bolus administration of intravenous contrast.  CONTRAST:  ISOVUE-300 IOPAMIDOL (ISOVUE-300) INJECTION 61%  COMPARISON:  01/26/2017 CT abdomen and pelvis.  FINDINGS: Lower chest: Right lower lobe pulmonary nodule has triangular configuration, likely perifissural nodule, intrapulmonary lymph node.  Hepatobiliary: Stable hepatomegaly. No focal liver lesion. No biliary ductal dilatation. Cholelithiasis.  Pancreas: Fatty atrophy, otherwise unremarkable. No pancreatic ductal dilatation or surrounding inflammatory changes.  Spleen: Splenomegaly 16.1 x 9.8 x 21.3 cm (volume = 1800 cm^3), previously 15.2 x 10.1 x 19.6 (volume = 1580) .  Adrenals/Urinary Tract: Adrenal glands are unremarkable. Kidneys are normal, without renal calculi, focal lesion, or hydronephrosis. Bladder is unremarkable.  Stomach/Bowel:  Stomach is within normal limits. Appendix appears normal. No evidence of bowel wall thickening, distention, or inflammatory changes.  Vascular/Lymphatic: Left external iliac and pelvic sidewall lymph nodes are increased in size: Left external iliac, 19 mm, series 2: Image 95; left pelvic side wall, 16 mm, 3:93.  Reproductive: Uterus and bilateral adnexa are unremarkable. IUD is no longer present.  Other: No abdominal wall hernia or abnormality. No abdominopelvic ascites.  Musculoskeletal: No acute or significant osseous findings.  IMPRESSION: 1. No acute process identified. 2. Interval increase in left external iliac and pelvic sidewall lymph node size as well as splenomegaly. Evaluation for systemic infectious, inflammatory, or lymphoproliferative process is recommended.   Electronically Signed   By: Mitzi Hansen M.D.   On: 08/10/2017 23:04    PATHOLOGY:     ASSESSMENT & PLAN:   Lymphadenopathy/Splenomegaly:  -Recent peripheral blood work-up results reviewed with patient. Hepatitis A/B/C serologies and HIV testing negative.  Platelets were slightly low during recent hospitalization with infection, but have since normalized. WBCs normal; no anemia noted.  Denies any recent bleeding/bruising. She has not had a menstrual cycle since her Mirena IUD was removed in 04/2017. Her current symptoms started in 05/2017 and have been persistent. Endorses night sweats, early satiety, frequent nausea and vomiting, left upper quadrant pain that radiates to her left side and back. Completed oral antibiotic course for recent cellulitis infection; this infection has resolved her pt report.  -CT  abd/pelvis done on 08/10/17 showed interval increase in left external iliac and pelvic sidewall lymph nodes, as well as splenomegaly.  [(L) external iliac node measured 19 mm; (L) pelvic side wall node measured 16 mm].  Spleen measured 1800 cubic cm (was previously 1580 on historical  imaging). Discussed with Dr. Melton Alar ([facility]).  She recommended short-interval repeat CT imaging in ~1 month (which would be ~3 months from most recent scan in January) to re-evaluate pelvic adenopathy.   -On recent CT, there is mention of RLL pulmonary nodule with triangular configuration, likely perifissural nodule/intrapulmonary lymph node and therefore benign. However, given patient's generalized symptoms, will add-on CT chest with repeat short-interval CT imaging to ensure there is no mediastinal adenopathy appreciated on imaging.  -Shared with patient/family that we suspect benign etiology of her adenopathy. Certainly, if this is a lymphoproliferative process, we would expect interval growth in affected lymph nodes.  Physical exam of her palpable lymph node chains is difficult given her body habitus.  -Will plan for CT chest/abd/pelvis in ~1 month and return to cancer center for follow-up with MD for additional evaluation and recommendations.  If her adenopathy has resolved, we discussed releasing her from additional follow-up at the cancer center at that time.  If her adenopathy has progressed, then we could consider biopsy. Defer to MD at next follow-up visit and she agrees with this plan.     Early satiety, frequent N&V, abdominal pain:  -Of note, the uterus and bilateral adnexa were unremarkable on CT, therefore I have low clinical suspicion for PCOS or ovarian cancer being the cause of some of her clinical symptoms. She does have h/o diabetes with gastroparesis, which is likely causing much of her N&V.   -Reportedly stopped taking PPI BID because it was not helpful.   -I wonder if her splenomegaly is causing much of her LUQ abdominal pain with referred side/left lower back pain.   -Continue follow-up with GI as recommended.        Dispo:  -CT chest/abd/pelvis in ~1 month; orders placed today.  -Return to cancer center a few days after    All questions were answered to  patient's stated satisfaction. Encouraged patient to call with any new concerns or questions before her next visit to the cancer center and we can certain see her sooner, if needed.      Orders placed this encounter:  Orders Placed This Encounter  Procedures  . CT Chest W Contrast  . CT Abdomen Pelvis W Contrast  . CBC with Differential/Platelet  . Comprehensive metabolic panel  . Lactate dehydrogenase      Lubertha Basque, NP [facility] [phone]

## 2017-09-06 NOTE — Patient Instructions (Signed)
[facility] at [facility] Discharge Instructions  RECOMMENDATIONS MADE BY THE CONSULTANT AND ANY TEST RESULTS WILL BE SENT TO YOUR REFERRING PHYSICIAN.  You were seen today by Lubertha BasqueGretchen Dawson NP. CT to be scheduled. Follow up a few days after scans with labs.   Thank you for choosing [facility] at [facility] to provide your oncology and hematology care.  To afford each patient quality time with our provider, please arrive at least 15 minutes before your scheduled appointment time.    If you have a lab appointment with the Cancer Center please come in thru the  Main Entrance and check in at the main information desk  You need to re-schedule your appointment should you arrive 10 or more minutes late.  We strive to give you quality time with our providers, and arriving late affects you and other patients whose appointments are after yours.  Also, if you no show three or more times for appointments you may be dismissed from the clinic at the providers discretion.     Again, thank you for choosing [facility].  Our hope is that these requests will decrease the amount of time that you wait before being seen by our physicians.       _____________________________________________________________  Should you have questions after your visit to [facility], please contact our office at [phone] between the hours of 8:30 a.m. and 4:30 p.m.  Voicemails left after 4:30 p.m. will not be returned until the following business day.  For prescription refill requests, have your pharmacy contact our office.       Resources For Cancer Patients and their Caregivers ? American Cancer Society: Can assist with transportation, wigs, general needs, runs Look Good Feel Better.        94918773391-(804)382-9526 ? Cancer Care: Provides financial assistance, online support groups, medication/co-pay assistance.  1-800-813-HOPE (229)420-2859(4673) ? [facility] Assists [place] cancer patients and their families through emotional , educational and financial support.  (331)722-1123323-757-4360 ? [place] DSS Where to apply for food stamps, Medicaid and utility assistance. (785)510-0121(432)407-5710 ? [facility]: Transportation to medical appointments. 272 304 3368(709)511-3730 ? Social Security Administration: May apply for disability if have a Stage IV cancer. 878-314-8490680-766-6873 267-075-67901-(574) 304-6659 ? [place] [facility], Dis[facility] and Transit Services: Assists with nutrition, care and transit needs. 6824366778(303)118-2981  Cancer Center Support Programs: @10RELATIVEDAYS @ > Cancer Support Group  2nd Tuesday of the month 1pm-2pm, Journey Room  > Creative Journey  3rd Tuesday of the month 1130am-1pm, Journey Room  > Look Good Feel Better  1st Wednesday of the month 10am-12 noon, Journey Room (Call American Cancer Society to register [phone])

## 2017-10-02 ENCOUNTER — Ambulatory Visit (HOSPITAL_COMMUNITY): Payer: Medicaid Other

## 2017-10-05 ENCOUNTER — Ambulatory Visit: Payer: Medicaid Other | Admitting: Gastroenterology

## 2017-10-05 ENCOUNTER — Ambulatory Visit (HOSPITAL_COMMUNITY): Payer: Medicaid Other

## 2017-10-09 ENCOUNTER — Other Ambulatory Visit (HOSPITAL_COMMUNITY): Payer: Medicaid Other

## 2017-10-09 ENCOUNTER — Ambulatory Visit (HOSPITAL_COMMUNITY): Payer: Medicaid Other | Admitting: Hematology

## 2017-10-19 ENCOUNTER — Telehealth (HOSPITAL_COMMUNITY): Payer: Self-pay | Admitting: Adult Health

## 2017-10-24 ENCOUNTER — Ambulatory Visit (HOSPITAL_COMMUNITY): Payer: Medicaid Other

## 2017-10-31 ENCOUNTER — Other Ambulatory Visit (HOSPITAL_COMMUNITY): Payer: Medicaid Other

## 2017-10-31 ENCOUNTER — Ambulatory Visit (HOSPITAL_COMMUNITY): Payer: Medicaid Other | Admitting: Hematology

## 2017-11-12 ENCOUNTER — Other Ambulatory Visit: Payer: Self-pay

## 2017-11-12 ENCOUNTER — Encounter (HOSPITAL_COMMUNITY): Payer: Self-pay | Admitting: Emergency Medicine

## 2017-11-12 ENCOUNTER — Emergency Department (HOSPITAL_COMMUNITY)
Admission: EM | Admit: 2017-11-12 | Discharge: 2017-11-12 | Disposition: A | Discharge status: Home / Self Care | Payer: Medicaid Other | Attending: Emergency Medicine | Admitting: Emergency Medicine

## 2017-11-12 DIAGNOSIS — R600 Localized edema: Secondary | ICD-10-CM | POA: Insufficient documentation

## 2017-11-12 DIAGNOSIS — R3912 Poor urinary stream: Secondary | ICD-10-CM | POA: Diagnosis not present

## 2017-11-12 DIAGNOSIS — E1165 Type 2 diabetes mellitus with hyperglycemia: Secondary | ICD-10-CM | POA: Insufficient documentation

## 2017-11-12 DIAGNOSIS — Z794 Long term (current) use of insulin: Secondary | ICD-10-CM | POA: Insufficient documentation

## 2017-11-12 DIAGNOSIS — R739 Hyperglycemia, unspecified: Secondary | ICD-10-CM

## 2017-11-12 DIAGNOSIS — R1012 Left upper quadrant pain: Secondary | ICD-10-CM | POA: Insufficient documentation

## 2017-11-12 DIAGNOSIS — R109 Unspecified abdominal pain: Secondary | ICD-10-CM

## 2017-11-12 LAB — CBC WITH DIFFERENTIAL/PLATELET
BASOS ABS: 0 10*3/uL (ref 0.0–0.1)
BASOS PCT: 0 %
EOS ABS: 0.1 10*3/uL (ref 0.0–0.7)
Eosinophils Relative: 2 %
HCT: 38.7 % (ref 36.0–46.0)
HEMOGLOBIN: 12.5 g/dL (ref 12.0–15.0)
Lymphocytes Relative: 30 %
Lymphs Abs: 2.1 10*3/uL (ref 0.7–4.0)
MCH: 28.2 pg (ref 26.0–34.0)
MCHC: 32.3 g/dL (ref 30.0–36.0)
MCV: 87.2 fL (ref 78.0–100.0)
Monocytes Absolute: 0.3 10*3/uL (ref 0.1–1.0)
Monocytes Relative: 5 %
NEUTROS PCT: 63 %
Neutro Abs: 4.4 10*3/uL (ref 1.7–7.7)
Platelets: 209 10*3/uL (ref 150–400)
RBC: 4.44 MIL/uL (ref 3.87–5.11)
RDW: 13.6 % (ref 11.5–15.5)
WBC: 7 10*3/uL (ref 4.0–10.5)

## 2017-11-12 LAB — URINALYSIS, ROUTINE W REFLEX MICROSCOPIC
BACTERIA UA: NONE SEEN
Bilirubin Urine: NEGATIVE
Glucose, UA: >=500 mg/dL — AB
Ketones, ur: NEGATIVE mg/dL
Leukocytes, UA: NEGATIVE
Nitrite: NEGATIVE
PH: 5 (ref 5.0–8.0)
Protein, ur: NEGATIVE mg/dL
Specific Gravity, Urine: 1.028 (ref 1.005–1.030)

## 2017-11-12 LAB — COMPREHENSIVE METABOLIC PANEL
ALK PHOS: 86 U/L (ref 38–126)
ALT: 19 U/L (ref 14–54)
AST: 16 U/L (ref 15–41)
Albumin: 3.2 g/dL — ABNORMAL LOW (ref 3.5–5.0)
Anion gap: 6 (ref 5–15)
BILIRUBIN TOTAL: 0.8 mg/dL (ref 0.3–1.2)
BUN: 11 mg/dL (ref 6–20)
CALCIUM: 8.8 mg/dL — AB (ref 8.9–10.3)
CO2: 27 mmol/L (ref 22–32)
Chloride: 103 mmol/L (ref 101–111)
Creatinine, Ser: 0.82 mg/dL (ref 0.44–1.00)
GFR calc non Af Amer: >60 mL/min (ref >60)
Glucose, Bld: 263 mg/dL — ABNORMAL HIGH (ref 65–99)
Potassium: 3.8 mmol/L (ref 3.5–5.1)
SODIUM: 136 mmol/L (ref 135–145)
TOTAL PROTEIN: 7.2 g/dL (ref 6.5–8.1)

## 2017-11-12 LAB — POC URINE PREG, ED: Preg Test, Ur: NEGATIVE

## 2017-11-12 LAB — LIPASE, BLOOD: LIPASE: 26 U/L (ref 11–51)

## 2017-11-12 MED ORDER — HYDROMORPHONE HCL 1 MG/ML IJ SOLN
1.0000 mg | Freq: Once | INTRAMUSCULAR | Status: AC
  Administered 2017-11-12: 1 mg via INTRAVENOUS
  Filled 2017-11-12: qty 1

## 2017-11-12 MED ORDER — ONDANSETRON HCL 4 MG/2ML IJ SOLN
4.0000 mg | Freq: Once | INTRAMUSCULAR | Status: AC
  Administered 2017-11-12: 4 mg via INTRAVENOUS
  Filled 2017-11-12: qty 2

## 2017-11-12 NOTE — ED Triage Notes (Signed)
Pt c/o left flank pain and lower back pain x 3 days.

## 2017-11-12 NOTE — ED Provider Notes (Signed)
[facility] EMERGENCY DEPARTMENT Provider Note   CSN: [id number] Arrival date & time: 11/12/17  0244     History   Chief Complaint Chief Complaint  Patient presents with  . Flank Pain    HPI Brittany [name] is a 37 y.o. female.  The history is provided by the patient.  Flank Pain  This is a new problem. The current episode started more than 2 days ago. The problem occurs daily. The problem has been gradually worsening. Associated symptoms include abdominal pain. Pertinent negatives include no chest pain and no shortness of breath. Exacerbated by: certain positions. Nothing relieves the symptoms. She has tried rest for the symptoms. The treatment provided no relief.   Patient with history of morbid obesity, anxiety, chronic abdominal pain, diabetes, cholelithiasis, GERD, chronic lymphedema presents with left flank left upper quadrant pain.  It has been ongoing for the past 3 days.  It is worsening.  No fevers or vomiting.  There is no lower abdominal pain.  No new chest pain or shortness of breath.  She reports decreased urine output. Past Medical History:  Diagnosis Date  . Anxiety   . Carpal tunnel syndrome on left 10/12/2016  . Cellulitis and abscess of leg   . Chronic abdominal pain   . Depression   . Diabetes mellitus without complication (HCC)   . Gallstones   . GERD (gastroesophageal reflux disease) 08/13/2017  . Headache   . Lymphedema   . Medical history non-contributory   . Sleep apnea    DX with sleep apnea - unable to use c-pap  . Umbilical hernia     Patient Active Problem List   Diagnosis Date Noted  . GERD (gastroesophageal reflux disease) 08/13/2017  . Uncontrolled type 2 diabetes mellitus with hyperglycemia, with long-term current use of insulin (HCC) 08/13/2017  . Sepsis (HCC) 08/11/2017  . Chronic abdominal pain   . Pelvic lymphadenopathy   . Splenomegaly   . Non-intractable vomiting with nausea   . Encounter for IUD removal 04/20/2017  . Pressure  injury of skin 10/24/2016  . Cellulitis and abscess of leg 10/23/2016  . Sleep apnea 10/23/2016  . Diabetes mellitus without complication (HCC) 10/23/2016  . Morbid obesity with body mass index (BMI) greater than or equal to 70 in adult [facility]) 10/23/2016  . Carpal tunnel syndrome on left 10/12/2016  . Candidal intertrigo 12/31/2014  . Morbid obesity (HCC) 12/29/2014  . Hyponatremia 12/29/2014  . Cellulitis of left lower extremity 12/29/2014    Past Surgical History:  Procedure Laterality Date  . CESAREAN SECTION  11/09/01  . HERNIA REPAIR    . INSERTION OF MESH N/A 01/02/2013   Procedure: INSERTION OF MESH;  Surgeon: Lodema Pilot, DO;  Location: [facility];  Service: General;  Laterality: N/A;  . UMBILICAL HERNIA REPAIR N/A 01/02/2013   Procedure: LAPAROSCOPIC UMBILICAL HERNIA;  Surgeon: Lodema Pilot, DO;  Location: [facility];  Service: General;  Laterality: N/A;     OB History    Gravida  4   Para  1   Term  1   Preterm      AB  2   Living  1     SAB  2   TAB      Ectopic      Multiple      Live Births  1            Home Medications    Prior to Admission medications   Medication Sig Start Date End Date  Taking? Authorizing Provider  insulin aspart (NOVOLOG) 100 UNIT/ML injection SLIDING SCALE 3 TIMES DAILY WITH MEALS: For blood sugar 70-120-no insulin. Blood sugar 121-150 take 5 units. Blood sugar 151-208 take 8 units. Blood sugar 201-250 take 12 units. Blood sugar 251-300 take 17 units. Blood sugar 301-350 take 20 units. Blood sugar 351-400 take 25 units. Blood sugar greater than 400 take 28 units and call your doctor. 03/27/16   Devoria Albe, MD  insulin glargine (LANTUS) 100 UNIT/ML injection Inject 0.65 mLs (65 Units total) into the skin at bedtime. 08/13/17   Dhungel, Theda Belfast, MD    Family History Family History  Problem Relation Age of Onset  . Cancer Mother        leukemia  . Diabetes Mother   . Hypertension Mother   . Hyperlipidemia Mother   . Other Father          Sciatica, kidney stones  . Alcohol abuse Paternal Grandfather   . Other Paternal Grandmother        bowel obstruction; pneumonia  . Hypertension Maternal Grandmother   . Diabetes Maternal Grandmother   . Cancer Maternal Grandfather        lung  . ADD / ADHD Daughter   . Depression Daughter   . Anxiety disorder Daughter     Social History Social History   Tobacco Use  . Smoking status: Never Smoker  . Smokeless tobacco: Never Used  Substance Use Topics  . Alcohol use: No  . Drug use: No     Allergies   Morphine and related; Latex; and Sulfa antibiotics   Review of Systems Review of Systems  Constitutional: Negative for fever.  Respiratory: Negative for shortness of breath.   Cardiovascular: Positive for leg swelling. Negative for chest pain.       Chronic LE edema   Gastrointestinal: Positive for abdominal pain. Negative for diarrhea and vomiting.  Genitourinary: Positive for decreased urine volume and flank pain. Negative for dysuria.  Neurological: Negative for weakness.  All other systems reviewed and are negative.    Physical Exam Updated Vital Signs BP (!) 147/115   Pulse 80   Temp 99.8 F (37.7 C)   Resp 18   Ht 1.803 m ( )   Wt (!) 237.6 kg (523 lb 12.8 oz)   SpO2 100%   BMI 73.06 kg/m   Physical Exam  CONSTITUTIONAL: Morbidly obese, uncomfortable appearing HEAD: Normocephalic/atraumatic EYES: EOMI/PERRL ENMT: Mucous membranes moist NECK: supple no meningeal signs SPINE/BACK:entire spine nontender CV: S1/S2 noted, no murmurs/rubs/gallops noted LUNGS: Lungs are clear to auscultation bilaterally, no apparent distress ABDOMEN: soft, mild left upper quadrant tenderness, no rebound or guarding, bowel sounds noted throughout abdomen GU: Left Cva tenderness NEURO: Pt is awake/alert/appropriate, moves all extremitiesx4.  No facial droop.   EXTREMITIES: pulses normal/equal, full ROM, chronic lower extremity edema noted SKIN: warm, color  normal, scar noted left flank, patient reports this is chronic.  No erythema or abscess.  No herpetic lesions PSYCH: Anxious  ED Treatments / Results  Labs (all labs ordered are listed, but only abnormal results are displayed) Labs Reviewed  URINALYSIS, ROUTINE W REFLEX MICROSCOPIC - Abnormal; Notable for the following components:      Result Value   Glucose, UA >=500 (*)    Hgb urine dipstick SMALL (*)    All other components within normal limits  COMPREHENSIVE METABOLIC PANEL - Abnormal; Notable for the following components:   Glucose, Bld 263 (*)    Calcium 8.8 (*)  Albumin 3.2 (*)    All other components within normal limits  LIPASE, BLOOD  CBC WITH DIFFERENTIAL/PLATELET  POC URINE PREG, ED    EKG None  Radiology No results found.  Procedures Procedures  Medications Ordered in ED Medications  HYDROmorphone (DILAUDID) injection 1 mg (1 mg Intravenous Given 11/12/17 0547)  ondansetron (ZOFRAN) injection 4 mg (4 mg Intravenous Given 11/12/17 0546)     Initial Impression / Assessment and Plan / ED Course  I have reviewed the triage vital signs and the nursing notes.  Pertinent labs results that were available during my care of the patient were reviewed by me and considered in my medical decision making (see chart for details).     6:00 AM Patient with multiple imaging modalities of abdomen previously.  I do not feel she needs repeat imaging now.  She apparently has chronic left upper quadrant pain.  She has left flank pain.  Urinalysis negative. No recent history of kidney stones We will check labs, and reassess 8:05 AM Labs reassuring.  Patient reports pain is improved. I do not feel emergent imaging is required at this time. Discharge home Final Clinical Impressions(s) / ED Diagnoses   Final diagnoses:  Left flank pain  Hyperglycemia    ED Discharge Orders    None       Zadie Rhine, MD 11/12/17 [id number]

## 2017-11-12 NOTE — Discharge Instructions (Addendum)

## 2017-12-08 ENCOUNTER — Telehealth: Payer: Self-pay | Admitting: Obstetrics and Gynecology

## 2017-12-08 NOTE — Telephone Encounter (Signed)
Patient called stating that she had her IUD removed in October and she thought she was suppose to get her period a month after that but pt states that it is going into June and she has not had a period. Pt states that she has taken pregnancy test and even went to the ER and they did a pregnancy test and they all have turned out negative. Pt would like to speak with a nurse. Please contact pt

## 2017-12-08 NOTE — Telephone Encounter (Signed)
Patient states she has still not had a period since having IUD removed. She is having pain with intercourse and abdominal pain. Patient states she made and appointment for Monday.  Advised to keep appointment to discuss issues.  Verbalized understanding.

## 2017-12-11 ENCOUNTER — Ambulatory Visit: Payer: Medicaid Other | Admitting: Obstetrics and Gynecology

## 2017-12-11 ENCOUNTER — Encounter: Payer: Self-pay | Admitting: Obstetrics and Gynecology

## 2017-12-11 VITALS — BP 134/76 | HR 80 | Ht 71.0 in | Wt >= 6400 oz

## 2017-12-11 DIAGNOSIS — Z6841 Body Mass Index (BMI) 40.0 and over, adult: Secondary | ICD-10-CM | POA: Diagnosis not present

## 2017-12-11 DIAGNOSIS — N912 Amenorrhea, unspecified: Secondary | ICD-10-CM | POA: Diagnosis not present

## 2017-12-11 NOTE — Progress Notes (Signed)
[facility] Visit  @DATE @            Patient name: Brittany FreezeJennifer L Pohlman MRN [id number]  Date of birth: [birth year]  CC & HPI:  Brittany FreezeJennifer L Milbrath is a 37 y.o. female presenting today for amenorrhea. Hasn't had a period since IUD removal in Oct 2018. Pt reports she was fine while on the IUD and had regular menstrual cycles and had gotten pregnant had 1 child before she got the IUD inserted. Pt reports organmegaly of spleen and liver and enlarged lymphnodes. Pt took an at home pregnancy test and it came back negative. Pt report she has had mood swings for the last couple of months but she also notes that she has DM  Pt takes 65 lantis at night  ROS:  ROS overall general mood swing over 2 months Pertinent History Reviewed:   Reviewed: Significant for non-sexual activity Medical         Past Medical History:  Diagnosis Date  . Anxiety   . Carpal tunnel syndrome on left 10/12/2016  . Cellulitis and abscess of leg   . Chronic abdominal pain   . Depression   . Diabetes mellitus without complication (HCC)   . Gallstones   . GERD (gastroesophageal reflux disease) 08/13/2017  . Headache   . Lymphedema   . Medical history non-contributory   . Sleep apnea    DX with sleep apnea - unable to use c-pap  . Umbilical hernia                               Surgical Hx:    Past Surgical History:  Procedure Laterality Date  . CESAREAN SECTION  11/09/01  . HERNIA REPAIR    . INSERTION OF MESH N/A 01/02/2013   Procedure: INSERTION OF MESH;  Surgeon: Lodema PilotBrian Layton, DO;  Location: [facility];  Service: General;  Laterality: N/A;  . UMBILICAL HERNIA REPAIR N/A 01/02/2013   Procedure: LAPAROSCOPIC UMBILICAL HERNIA;  Surgeon: Lodema PilotBrian Layton, DO;  Location: [facility];  Service: General;  Laterality: N/A;   Medications: Reviewed & Updated - see associated section                       Current Outpatient Medications:  .  insulin aspart (NOVOLOG) 100 UNIT/ML injection, SLIDING SCALE 3 TIMES DAILY WITH MEALS: For  blood sugar 70-120-no insulin. Blood sugar 121-150 take 5 units. Blood sugar 151-208 take 8 units. Blood sugar 201-250 take 12 units. Blood sugar 251-300 take 17 units. Blood sugar 301-350 take 20 units. Blood sugar 351-400 take 25 units. Blood sugar greater than 400 take 28 units and call your doctor., Disp: 10 mL, Rfl: 0 .  insulin glargine (LANTUS) 100 UNIT/ML injection, Inject 0.65 mLs (65 Units total) into the skin at bedtime., Disp: 10 mL, Rfl: 11   Social History: Reviewed -  reports that she has never smoked. She has never used smokeless tobacco.  Objective Findings:  Vitals: Blood pressure 134/76, pulse 80, height 5\' 11"  (1.803 m), weight (!) 545 lb (247.2 kg).  PHYSICAL EXAMINATION General appearance - alert, well appearing, and in no distress and oriented to person, place, and time Mental status - alert, oriented to person, place, and time, normal mood, behavior, speech, dress, motor activity, and thought processes, affect appropriate to mood  PELVIC Pelvic deferred  Assessment & Plan:   A:  1.  Amenorrhea r/o inovulation 2.  Check serum pregnancy test P:  1.  Rx Provera 10 x 14d  2. Fsh, progesterone tsh  By signing my name below, I, Arnette Norris, attest that this documentation has been prepared under the direction and in the presence of Tilda Burrow, MD. Electronically Signed: Arnette Norris Medical Scribe. 12/11/17. 9:55 AM.  I personally performed the services described in this documentation, which was SCRIBED in my presence. The recorded information has been reviewed and considered accurate. It has been edited as necessary during review. Tilda Burrow, MD

## 2017-12-12 LAB — PROGESTERONE: Progesterone: 0.2 ng/mL

## 2017-12-12 LAB — TSH: TSH: 6.5 u[IU]/mL — ABNORMAL HIGH (ref 0.450–4.500)

## 2017-12-12 LAB — HCG, SERUM, QUALITATIVE: HCG, BETA SUBUNIT, QUAL, SERUM: NEGATIVE m[IU]/mL (ref <6)

## 2017-12-14 ENCOUNTER — Telehealth: Payer: Self-pay | Admitting: *Deleted

## 2017-12-15 NOTE — Telephone Encounter (Signed)
LMOM that I would have Dr Emelda FearFerguson review her labs and get back with her.

## 2017-12-18 NOTE — Telephone Encounter (Signed)
Informed patient she is not pregnant nor ovulating but TSH showed hypothyroidism.  Per Dr Emelda FearFerguson needs Thryoid panel and appt.  Pt states she has appt on 6/26 but is unable to come earlier to have thyroid panel drawn because her husband is unable to bring her.  Advised she could just have drawn that day after her visit.

## 2018-01-03 ENCOUNTER — Encounter: Payer: Self-pay | Admitting: Obstetrics and Gynecology

## 2018-01-03 ENCOUNTER — Ambulatory Visit: Payer: Medicaid Other | Admitting: Obstetrics and Gynecology

## 2018-01-03 DIAGNOSIS — E038 Other specified hypothyroidism: Secondary | ICD-10-CM | POA: Diagnosis not present

## 2018-01-03 DIAGNOSIS — E039 Hypothyroidism, unspecified: Secondary | ICD-10-CM

## 2018-01-03 DIAGNOSIS — E119 Type 2 diabetes mellitus without complications: Secondary | ICD-10-CM | POA: Diagnosis not present

## 2018-01-03 HISTORY — DX: Hypothyroidism, unspecified: E03.9

## 2018-01-03 MED ORDER — LEVOTHYROXINE SODIUM 50 MCG PO TABS: 50.0000 ug | ORAL_TABLET | Freq: Every day | ORAL | 3 refills | Status: DC

## 2018-01-03 NOTE — Patient Instructions (Signed)
Hypothyroidism Hypothyroidism is a disorder of the thyroid. The thyroid is a large gland that is located in the lower front of the neck. The thyroid releases hormones that control how the body works. With hypothyroidism, the thyroid does not make enough of these hormones. What are the causes? Causes of hypothyroidism may include:  Viral infections.  Pregnancy.  Your own defense system (immune system) attacking your thyroid.  Certain medicines.  Birth defects.  Past radiation treatments to your head or neck.  Past treatment with radioactive iodine.  Past surgical removal of part or all of your thyroid.  Problems with the gland that is located in the center of your brain (pituitary).  What are the signs or symptoms? Signs and symptoms of hypothyroidism may include:  Feeling as though you have no energy (lethargy).  Inability to tolerate cold.  Weight gain that is not explained by a change in diet or exercise habits.  Dry skin.  Coarse hair.  Menstrual irregularity.  Slowing of thought processes.  Constipation.  Sadness or depression.  How is this diagnosed? Your health care provider may diagnose hypothyroidism with blood tests and ultrasound tests. How is this treated? Hypothyroidism is treated with medicine that replaces the hormones that your body does not make. After you begin treatment, it may take several weeks for symptoms to go away. Follow these instructions at home:  Take medicines only as directed by your health care provider.  If you start taking any new medicines, tell your health care provider.  Keep all follow-up visits as directed by your health care provider. This is important. As your condition improves, your dosage needs may change. You will need to have blood tests regularly so that your health care provider can watch your condition. Contact a health care provider if:  Your symptoms do not get better with treatment.  You are taking thyroid  replacement medicine and: ? You sweat excessively. ? You have tremors. ? You feel anxious. ? You lose weight rapidly. ? You cannot tolerate heat. ? You have emotional swings. ? You have diarrhea. ? You feel weak. Get help right away if:  You develop chest pain.  You develop an irregular heartbeat.  You develop a rapid heartbeat. This information is not intended to replace advice given to you by your health care provider. Make sure you discuss any questions you have with your health care provider. Document Released: 06/27/2005 Document Revised: 12/03/2015 Document Reviewed: 11/12/2013 Elsevier Interactive Patient Education  2018 Elsevier Inc.  

## 2018-01-03 NOTE — Progress Notes (Signed)
Patient ID: Brittany [name], female   DOB: 04-24-1981, 37 y.o.   MRN: [id number]    [facility] Visit  @DATE @            Patient name: Brittany [name] MRN [id number]  Date of birth: 03-18-1981  CC & HPI:  Brittany [name] is a 37 y.o. female presenting today to follow up about her last visit for amenorrhea on 12/11/2017. She has not had a period since IUD removal in October 2018. She notes decreased energy. She denies fever, chills, and any other symptoms or complaints at this time.  ROS:  ROS + amenorrhea + decreased energy - fever - chills All systems are negative except as noted in the HPI and PMH.   Pertinent History Reviewed:   Reviewed: Significant for obesity Medical         Past Medical History:  Diagnosis Date  . Anxiety   . Carpal tunnel syndrome on left 10/12/2016  . Cellulitis and abscess of leg   . Chronic abdominal pain   . Depression   . Diabetes mellitus without complication (HCC)   . Gallstones   . GERD (gastroesophageal reflux disease) 08/13/2017  . Headache   . Hypothyroid 01/03/2018  . Lymphedema   . Medical history non-contributory   . Sleep apnea    DX with sleep apnea - unable to use c-pap  . Umbilical hernia                               Surgical Hx:    Past Surgical History:  Procedure Laterality Date  . CESAREAN SECTION  11/09/01  . HERNIA REPAIR    . INSERTION OF MESH N/A 01/02/2013   Procedure: INSERTION OF MESH;  Surgeon: Lodema Pilot, DO;  Location: [facility];  Service: General;  Laterality: N/A;  . UMBILICAL HERNIA REPAIR N/A 01/02/2013   Procedure: LAPAROSCOPIC UMBILICAL HERNIA;  Surgeon: Lodema Pilot, DO;  Location: [facility];  Service: General;  Laterality: N/A;   Medications: Reviewed & Updated - see associated section                       Current Outpatient Medications:  .  FLUoxetine (PROZAC) 20 MG capsule, Take 20 mg by mouth daily., Disp: , Rfl:  .  insulin aspart (NOVOLOG) 100 UNIT/ML injection, SLIDING SCALE 3 TIMES DAILY WITH  MEALS: For blood sugar 70-120-no insulin. Blood sugar 121-150 take 5 units. Blood sugar 151-208 take 8 units. Blood sugar 201-250 take 12 units. Blood sugar 251-300 take 17 units. Blood sugar 301-350 take 20 units. Blood sugar 351-400 take 25 units. Blood sugar greater than 400 take 28 units and call your doctor., Disp: 10 mL, Rfl: 0 .  insulin glargine (LANTUS) 100 UNIT/ML injection, Inject 0.65 mLs (65 Units total) into the skin at bedtime., Disp: 10 mL, Rfl: 11 .  mefloquine (LARIAM) 250 MG tablet, Take 250 mg by mouth every 7 (seven) days., Disp: , Rfl:  .  metFORMIN (GLUCOPHAGE) 500 MG tablet, Take by mouth 2 (two) times daily with a meal., Disp: , Rfl:  .  levothyroxine (SYNTHROID) 50 MCG tablet, Take 1 tablet (50 mcg total) by mouth daily before breakfast., Disp: 30 tablet, Rfl: 3   Social History: Reviewed -  reports that she has never smoked. She has never used smokeless tobacco.  Objective Findings:  Vitals: Blood pressure 128/90, pulse (!) 103, height 5\' 11"  (  1.803 m), weight (!) 540 lb (244.9 kg). Body mass index is 75.31 kg/m.   PHYSICAL EXAMINATION General appearance - alert, well appearing, and in no distress, oriented to person, place, and time and overweight Mental status - alert, oriented to person, place, and time, normal mood, behavior, speech, dress, motor activity, and thought processes, affect appropriate to mood  PELVIC DEFERRED  Assessment & Plan:   A:  1. Low progesterone levels 2. Low thyroid Body mass index is 75.31 kg/m. 4     DM suboptimal control P:  1. Rx Synthroid 50 mcg 2. F/U in 6 months for anovulation 3. Refer to primary care in 30 d for ongoing mgmt of low thyoid, and diabetes.    By signing my name below, I, Pietro Cassismily Tufford, attest that this documentation has been prepared under the direction and in the presence of Tilda BurrowFerguson, August Longest V, MD. Electronically Signed: Pietro CassisEmily Tufford, Medical Scribe. 01/03/18. 10:05 AM.  .

## 2018-01-09 ENCOUNTER — Encounter (INDEPENDENT_AMBULATORY_CARE_PROVIDER_SITE_OTHER): Payer: Self-pay | Admitting: Internal Medicine

## 2018-01-09 ENCOUNTER — Encounter (INDEPENDENT_AMBULATORY_CARE_PROVIDER_SITE_OTHER): Payer: Self-pay | Admitting: *Deleted

## 2018-01-09 ENCOUNTER — Ambulatory Visit (INDEPENDENT_AMBULATORY_CARE_PROVIDER_SITE_OTHER): Payer: Medicaid Other | Admitting: Internal Medicine

## 2018-01-09 DIAGNOSIS — R112 Nausea with vomiting, unspecified: Secondary | ICD-10-CM

## 2018-01-09 NOTE — Progress Notes (Signed)
Subjective:    Patient ID: Brittany [name], female    DOB: 10-24-1980, 37 y.o.   MRN: [id number] Wheel chair bound but can ambulate without difficulty  HPI Referred by Roque CashBriteny Joyce FNP for vomiting. She also has nausea and chronic abdominal pain.  She says it feels like a knife has been shoved in her back and exits her left lower quadrant. She has had the pain since November of last year. She states she has a lot of vomiting. She vomits after she eats an sometimes it is immediately after she eats. Blood sugars up and down she says.  08/11/2017 HA1C 10.7 Has been evaluated by Dr. Melton [facility]Higgs at the [facility] for lymphadenopathy. Has been a diabetic x 4 yrs. She says her weight is up and down. (Per records, she really has not had any weight loss) Her appetite is okay. Sometimes she just doesn't want to eat. Has a BM daily. She is morbidly obese.  Denies any GERD.  Per records has been seen multiple times in the ED for left sided abdominal pain.  Recent CT scan in January of this year for abdominal pain, vomiting, fever revealed 1. No acute process identified. 2. Interval increase in left external iliac and pelvic sidewall lymph node size as well as splenomegaly. Evaluation for systemic infectious, inflammatory, or lymphoproliferative process is recommended.    CBC    Component Value Date/Time   WBC 7.0 11/12/2017 0650   RBC 4.44 11/12/2017 0650   HGB 12.5 11/12/2017 0650   HCT 38.7 11/12/2017 0650   PLT 209 11/12/2017 0650   MCV 87.2 11/12/2017 0650   MCH 28.2 11/12/2017 0650   MCHC 32.3 11/12/2017 0650   RDW 13.6 11/12/2017 0650   LYMPHSABS 2.1 11/12/2017 0650   MONOABS 0.3 11/12/2017 0650   EOSABS 0.1 11/12/2017 0650   BASOSABS 0.0 11/12/2017 0650   CMP Latest Ref Rng & Units 11/12/2017 08/22/2017 08/12/2017  Glucose 65 - 99 mg/dL 782(N263(H) 562(Z197(H) 308(M189(H)  BUN 6 - 20 mg/dL 11 14 10   Creatinine 0.44 - 1.00 mg/dL 5.780.82 4.690.63 6.290.67  Sodium 135 - 145 mmol/L 136 132(L) 136  Potassium 3.5  - 5.1 mmol/L 3.8 3.9 3.5  Chloride 101 - 111 mmol/L 103 102 104  CO2 22 - 32 mmol/L 27 21(L) 19(L)  Calcium 8.9 - 10.3 mg/dL 5.2(W8.8(L) 8.9 4.1(L8.3(L)  Total Protein 6.5 - 8.1 g/dL 7.2 8.0 -  Total Bilirubin 0.3 - 1.2 mg/dL 0.8 0.9 -  Alkaline Phos 38 - 126 U/L 86 79 -  AST 15 - 41 U/L 16 19 -  ALT 14 - 54 U/L 19 20 -   08/29/2017 Hep B S Ab non reactive Hep B surface antigen negative Acute hepatitis panel negative.  HIV non reactive.    Review of Systems Past Medical History:  Diagnosis Date  . Anxiety   . Carpal tunnel syndrome on left 10/12/2016  . Cellulitis and abscess of leg   . Chronic abdominal pain   . Depression   . Diabetes mellitus without complication (HCC)   . Gallstones   . GERD (gastroesophageal reflux disease) 08/13/2017  . Headache   . Hypothyroid 01/03/2018  . Lymphedema   . Medical history non-contributory   . Sleep apnea    DX with sleep apnea - unable to use c-pap  . Umbilical hernia     Past Surgical History:  Procedure Laterality Date  . CESAREAN SECTION  11/09/01  . HERNIA REPAIR    . INSERTION OF  MESH N/A 01/02/2013   Procedure: INSERTION OF MESH;  Surgeon: Lodema Pilot, DO;  Location: [facility];  Service: General;  Laterality: N/A;  . UMBILICAL HERNIA REPAIR N/A 01/02/2013   Procedure: LAPAROSCOPIC UMBILICAL HERNIA;  Surgeon: Lodema Pilot, DO;  Location: [facility];  Service: General;  Laterality: N/A;    Allergies  Allergen Reactions  . Morphine And Related Other (See Comments)    "it just freaks me out"  . Latex Itching, Swelling and Rash  . Sulfa Antibiotics Nausea And Vomiting and Rash    Current Outpatient Medications on File Prior to Visit  Medication Sig Dispense Refill  . FLUoxetine (PROZAC) 20 MG capsule Take 20 mg by mouth daily.    . insulin aspart (NOVOLOG) 100 UNIT/ML injection SLIDING SCALE 3 TIMES DAILY WITH MEALS: For blood sugar 70-120-no insulin. Blood sugar 121-150 take 5 units. Blood sugar 151-208 take 8 units. Blood sugar 201-250 take 12  units. Blood sugar 251-300 take 17 units. Blood sugar 301-350 take 20 units. Blood sugar 351-400 take 25 units. Blood sugar greater than 400 take 28 units and call your doctor. 10 mL 0  . insulin glargine (LANTUS) 100 UNIT/ML injection Inject 0.65 mLs (65 Units total) into the skin at bedtime. 10 mL 11  . levothyroxine (SYNTHROID) 50 MCG tablet Take 1 tablet (50 mcg total) by mouth daily before breakfast. 30 tablet 3  . metFORMIN (GLUCOPHAGE) 500 MG tablet Take by mouth 2 (two) times daily with a meal.     No current facility-administered medications on file prior to visit.         Objective:   Physical Exam Blood pressure (!) 144/92, pulse 92, temperature 98.6 F (37 C), height 5\' 11"  (1.803 m), weight (!) 539 lb 6.4 oz (244.7 kg). Alert and oriented. Skin warm and dry. Oral mucosa is moist.   . Sclera anicteric, conjunctivae is pink. Thyroid not enlarged. No cervical lymphadenopathy. Lungs clear. Heart regular rate and rhythm.  Abdomen is soft. Bowel sounds are positive.  Morbidly obese. Cellulitis to both lower extremities.          Assessment & Plan:  Nausea with vomiting. Hx of uncontrolled diabetes. Am going to get a  NM Emptying study.  Further recommendations to follow. From notes, has been evaluated by [facility] this year.  Will get Emptying study, and she will follow up with [facility]. Thank u for allowing me to participate in her care.

## 2018-01-09 NOTE — Patient Instructions (Signed)
Emptying study.  

## 2018-01-16 ENCOUNTER — Encounter (HOSPITAL_COMMUNITY): Payer: Self-pay

## 2018-01-16 ENCOUNTER — Encounter (HOSPITAL_COMMUNITY)
Admission: RE | Admit: 2018-01-16 | Discharge: 2018-01-16 | Disposition: A | Discharge status: Home / Self Care | Payer: Medicaid Other | Source: Ambulatory Visit | Attending: Internal Medicine | Admitting: Internal Medicine

## 2018-01-16 DIAGNOSIS — R112 Nausea with vomiting, unspecified: Secondary | ICD-10-CM | POA: Insufficient documentation

## 2018-01-16 MED ORDER — TECHNETIUM TC 99M SULFUR COLLOID
2.0000 | Freq: Once | INTRAVENOUS | Status: AC | PRN
  Administered 2018-01-16: 2 via ORAL

## 2018-02-04 ENCOUNTER — Encounter (HOSPITAL_COMMUNITY): Payer: Self-pay | Admitting: *Deleted

## 2018-02-04 ENCOUNTER — Emergency Department (HOSPITAL_COMMUNITY): Payer: Medicaid Other

## 2018-02-04 ENCOUNTER — Emergency Department (HOSPITAL_COMMUNITY)
Admission: EM | Admit: 2018-02-04 | Discharge: 2018-02-04 | Disposition: A | Discharge status: Home / Self Care | Payer: Medicaid Other | Attending: Emergency Medicine | Admitting: Emergency Medicine

## 2018-02-04 DIAGNOSIS — E1165 Type 2 diabetes mellitus with hyperglycemia: Secondary | ICD-10-CM | POA: Diagnosis not present

## 2018-02-04 DIAGNOSIS — R1084 Generalized abdominal pain: Secondary | ICD-10-CM | POA: Diagnosis present

## 2018-02-04 DIAGNOSIS — E039 Hypothyroidism, unspecified: Secondary | ICD-10-CM | POA: Diagnosis not present

## 2018-02-04 DIAGNOSIS — Z794 Long term (current) use of insulin: Secondary | ICD-10-CM | POA: Insufficient documentation

## 2018-02-04 DIAGNOSIS — R112 Nausea with vomiting, unspecified: Secondary | ICD-10-CM | POA: Insufficient documentation

## 2018-02-04 DIAGNOSIS — Z79899 Other long term (current) drug therapy: Secondary | ICD-10-CM | POA: Diagnosis not present

## 2018-02-04 DIAGNOSIS — R109 Unspecified abdominal pain: Secondary | ICD-10-CM

## 2018-02-04 DIAGNOSIS — R739 Hyperglycemia, unspecified: Secondary | ICD-10-CM

## 2018-02-04 DIAGNOSIS — Z91040 Latex allergy status: Secondary | ICD-10-CM | POA: Insufficient documentation

## 2018-02-04 DIAGNOSIS — G8929 Other chronic pain: Secondary | ICD-10-CM

## 2018-02-04 LAB — URINALYSIS, ROUTINE W REFLEX MICROSCOPIC
Bacteria, UA: NONE SEEN
Bilirubin Urine: NEGATIVE
Glucose, UA: >=500 mg/dL — AB
Hgb urine dipstick: NEGATIVE
Ketones, ur: NEGATIVE mg/dL
Leukocytes, UA: NEGATIVE
Nitrite: NEGATIVE
PH: 6 (ref 5.0–8.0)
Protein, ur: NEGATIVE mg/dL
SPECIFIC GRAVITY, URINE: 1.024 (ref 1.005–1.030)

## 2018-02-04 LAB — CBC WITH DIFFERENTIAL/PLATELET
Basophils Absolute: 0 10*3/uL (ref 0.0–0.1)
Basophils Relative: 0 %
EOS ABS: 0.1 10*3/uL (ref 0.0–0.7)
EOS PCT: 2 %
HCT: 40.7 % (ref 36.0–46.0)
Hemoglobin: 13.2 g/dL (ref 12.0–15.0)
Lymphocytes Relative: 27 %
Lymphs Abs: 1.9 10*3/uL (ref 0.7–4.0)
MCH: 28 pg (ref 26.0–34.0)
MCHC: 32.4 g/dL (ref 30.0–36.0)
MCV: 86.4 fL (ref 78.0–100.0)
Monocytes Absolute: 0.3 10*3/uL (ref 0.1–1.0)
Monocytes Relative: 5 %
Neutro Abs: 4.6 10*3/uL (ref 1.7–7.7)
Neutrophils Relative %: 66 %
PLATELETS: 202 10*3/uL (ref 150–400)
RBC: 4.71 MIL/uL (ref 3.87–5.11)
RDW: 14 % (ref 11.5–15.5)
WBC: 6.9 10*3/uL (ref 4.0–10.5)

## 2018-02-04 LAB — COMPREHENSIVE METABOLIC PANEL
ALBUMIN: 3.4 g/dL — AB (ref 3.5–5.0)
ALK PHOS: 81 U/L (ref 38–126)
ALT: 21 U/L (ref 0–44)
ANION GAP: 7 (ref 5–15)
AST: 17 U/L (ref 15–41)
BILIRUBIN TOTAL: 0.5 mg/dL (ref 0.3–1.2)
BUN: 12 mg/dL (ref 6–20)
CALCIUM: 8.7 mg/dL — AB (ref 8.9–10.3)
CO2: 27 mmol/L (ref 22–32)
Chloride: 103 mmol/L (ref 98–111)
Creatinine, Ser: 0.8 mg/dL (ref 0.44–1.00)
GFR calc Af Amer: >60 mL/min (ref >60)
Glucose, Bld: 310 mg/dL — ABNORMAL HIGH (ref 70–99)
POTASSIUM: 3.9 mmol/L (ref 3.5–5.1)
Sodium: 137 mmol/L (ref 135–145)
Total Protein: 7.5 g/dL (ref 6.5–8.1)

## 2018-02-04 LAB — I-STAT BETA HCG BLOOD, ED (MC, WL, AP ONLY): I-stat hCG, quantitative: <5 m[IU]/mL (ref <5)

## 2018-02-04 LAB — LIPASE, BLOOD: Lipase: 32 U/L (ref 11–51)

## 2018-02-04 MED ORDER — FAMOTIDINE 20 MG PO TABS: 20.0000 mg | ORAL_TABLET | Freq: Two times a day (BID) | ORAL | 0 refills | Status: DC

## 2018-02-04 MED ORDER — ONDANSETRON HCL 4 MG/2ML IJ SOLN
4.0000 mg | INTRAMUSCULAR | Status: DC | PRN
  Administered 2018-02-04: 4 mg via INTRAVENOUS
  Filled 2018-02-04: qty 2

## 2018-02-04 MED ORDER — SODIUM CHLORIDE 0.9 % IV BOLUS
1000.0000 mL | Freq: Once | INTRAVENOUS | Status: AC
  Administered 2018-02-04: 1000 mL via INTRAVENOUS

## 2018-02-04 MED ORDER — FENTANYL CITRATE (PF) 100 MCG/2ML IJ SOLN
50.0000 ug | INTRAMUSCULAR | Status: AC | PRN
  Administered 2018-02-04 (×2): 50 ug via INTRAVENOUS
  Filled 2018-02-04 (×2): qty 2

## 2018-02-04 MED ORDER — METOCLOPRAMIDE HCL 10 MG PO TABS: 10.0000 mg | ORAL_TABLET | Freq: Four times a day (QID) | ORAL | 0 refills | Status: DC | PRN

## 2018-02-04 MED ORDER — FAMOTIDINE IN NACL 20-0.9 MG/50ML-% IV SOLN
20.0000 mg | Freq: Once | INTRAVENOUS | Status: AC
  Administered 2018-02-04: 20 mg via INTRAVENOUS
  Filled 2018-02-04: qty 50

## 2018-02-04 NOTE — ED Triage Notes (Signed)
Pt with left flank pain for past 2 days, emesis x 1 today and x 3 yesterday.  Pt denies fever.

## 2018-02-04 NOTE — Discharge Instructions (Addendum)
Eat a bland diet, avoiding greasy, fatty, fried foods, as well as spicy and acidic foods or beverages.  Avoid eating within 2 to 3 hours before going to bed or laying down.  Also avoid teas, colas, coffee, chocolate, pepermint and spearment. Increase your fluid intake for the next several days. Take the prescriptions as directed.  Call your regular medical doctor and your GI doctor tomorrow to schedule a follow up appointment this week.  Return to the Emergency Department immediately if worsening.

## 2018-02-04 NOTE — ED Provider Notes (Signed)
[facility] EMERGENCY DEPARTMENT Provider Note   CSN: [id number] Arrival date & time: 02/04/18  1206     History   Chief Complaint Chief Complaint  Patient presents with  . Flank Pain    w/ emesis    HPI Brittany [name] is a 37 y.o. female.  HPI  Pt was seen at 1230.  Per pt, c/o gradual onset and persistence of constant acute flair of her chronic left sided abd and flank "pains" for the past several days. Has been associated with multiple intermittent episodes of N/V. States her stools were "small" and "hard" followed by diarrhea.   Describes the abd pain as "stabbing," and per her usual chronic pain pattern. States she has been referred to GI MD "but then they referred me to another one." Denies fevers, no rash, no CP/SOB, no cough, no injury, no black or blood in stools or emesis.      Past Medical History:  Diagnosis Date  . Anxiety   . Carpal tunnel syndrome on left 10/12/2016  . Cellulitis and abscess of leg   . Chronic abdominal pain   . Depression   . Diabetes mellitus without complication (HCC)   . Gallstones   . GERD (gastroesophageal reflux disease) 08/13/2017  . Headache   . Hypothyroid 01/03/2018  . Lymphedema   . Medical history non-contributory   . Sleep apnea    DX with sleep apnea - unable to use c-pap  . Umbilical hernia     Patient Active Problem List   Diagnosis Date Noted  . Hypothyroid 01/03/2018  . GERD (gastroesophageal reflux disease) 08/13/2017  . Uncontrolled type 2 diabetes mellitus with hyperglycemia, with long-term current use of insulin (HCC) 08/13/2017  . Sepsis (HCC) 08/11/2017  . Chronic abdominal pain   . Pelvic lymphadenopathy   . Splenomegaly   . Non-intractable vomiting with nausea   . Encounter for IUD removal 04/20/2017  . Pressure injury of skin 10/24/2016  . Cellulitis and abscess of leg 10/23/2016  . Sleep apnea 10/23/2016  . Diabetes mellitus without complication (HCC) 10/23/2016  . Morbid obesity with body mass index  (BMI) greater than or equal to 70 in adult [facility]) 10/23/2016  . Carpal tunnel syndrome on left 10/12/2016  . Candidal intertrigo 12/31/2014  . Morbid obesity (HCC) 12/29/2014  . Hyponatremia 12/29/2014  . Cellulitis of left lower extremity 12/29/2014    Past Surgical History:  Procedure Laterality Date  . CESAREAN SECTION  11/09/01  . HERNIA REPAIR    . INSERTION OF MESH N/A 01/02/2013   Procedure: INSERTION OF MESH;  Surgeon: Lodema Pilot, DO;  Location: [facility];  Service: General;  Laterality: N/A;  . UMBILICAL HERNIA REPAIR N/A 01/02/2013   Procedure: LAPAROSCOPIC UMBILICAL HERNIA;  Surgeon: Lodema Pilot, DO;  Location: [facility];  Service: General;  Laterality: N/A;     OB History    Gravida  4   Para  1   Term  1   Preterm      AB  2   Living  1     SAB  2   TAB      Ectopic      Multiple      Live Births  1            Home Medications    Prior to Admission medications   Medication Sig Start Date End Date Taking? Authorizing Provider  acetaminophen (TYLENOL) 500 MG tablet Take 1,000 mg by mouth every 6 (six) hours  as needed for mild pain.   Yes [provider]  FLUoxetine (PROZAC) 20 MG capsule Take 20 mg by mouth daily.   Yes [provider]  insulin aspart (NOVOLOG) 100 UNIT/ML injection SLIDING SCALE 3 TIMES DAILY WITH MEALS: For blood sugar 70-120-no insulin. Blood sugar 121-150 take 5 units. Blood sugar 151-208 take 8 units. Blood sugar 201-250 take 12 units. Blood sugar 251-300 take 17 units. Blood sugar 301-350 take 20 units. Blood sugar 351-400 take 25 units. Blood sugar greater than 400 take 28 units and call your doctor. 03/27/16  Yes Devoria AlbeKnapp, Iva, MD  insulin glargine (LANTUS) 100 UNIT/ML injection Inject 0.65 mLs (65 Units total) into the skin at bedtime. 08/13/17  Yes Dhungel, Nishant, MD  levothyroxine (SYNTHROID) 50 MCG tablet Take 1 tablet (50 mcg total) by mouth daily before breakfast. 01/03/18  Yes Tilda BurrowFerguson, John V, MD  metFORMIN  (GLUCOPHAGE-XR) 500 MG 24 hr tablet Take 500 mg by mouth 2 (two) times daily.   Yes [provider]  naproxen sodium (ALEVE) 220 MG tablet Take 440 mg by mouth daily as needed (pain).   Yes [provider]    Family History Family History  Problem Relation Age of Onset  . Cancer Mother        leukemia  . Diabetes Mother   . Hypertension Mother   . Hyperlipidemia Mother   . Other Father        Sciatica, kidney stones  . Alcohol abuse Paternal Grandfather   . Other Paternal Grandmother        bowel obstruction; pneumonia  . Hypertension Maternal Grandmother   . Diabetes Maternal Grandmother   . Cancer Maternal Grandfather        lung  . ADD / ADHD Daughter   . Depression Daughter   . Anxiety disorder Daughter     Social History Social History   Tobacco Use  . Smoking status: Never Smoker  . Smokeless tobacco: Never Used  Substance Use Topics  . Alcohol use: No  . Drug use: No     Allergies   Paxil [paroxetine hcl]; Morphine and related; Latex; and Sulfa antibiotics   Review of Systems Review of Systems ROS: Statement: All systems negative except as marked or noted in the HPI; Constitutional: Negative for fever and chills. ; ; Eyes: Negative for eye pain, redness and discharge. ; ; ENMT: Negative for ear pain, hoarseness, nasal congestion, sinus pressure and sore throat. ; ; Cardiovascular: Negative for chest pain, palpitations, diaphoresis, dyspnea and peripheral edema. ; ; Respiratory: Negative for cough, wheezing and stridor. ; ; Gastrointestinal: +N/V/D, abd pain. Negative for blood in stool, hematemesis, jaundice and rectal bleeding. . ; ; Genitourinary: Negative for dysuria and hematuria. ; ; Musculoskeletal: +back pain. Negative for neck pain. Negative for swelling and trauma.; ; Skin: Negative for pruritus, rash, abrasions, blisters, bruising and skin lesion.; ; Neuro: Negative for headache, lightheadedness and neck stiffness. Negative for weakness,  altered level of consciousness, altered mental status, extremity weakness, paresthesias, involuntary movement, seizure and syncope.      Physical Exam Updated Vital Signs BP (!) 155/94 (BP Location: Right Arm)   Pulse 93   Temp 98.2 F (36.8 C) (Oral)   Resp 18   Ht 5\' 11"  (1.803 m)   Wt (!) 244.9 kg (540 lb)   SpO2 100%   BMI 75.31 kg/m    Patient Vitals for the past 24 hrs:  BP Temp Temp src Pulse Resp SpO2 Height Weight  02/04/18 1308 125/88 - - 86 18 100 % - -  02/04/18 1213 - - - - - - 5\' 11"  (1.803 m) (!) 244.9 kg (540 lb)  02/04/18 1210 (!) 155/94 98.2 F (36.8 C) Oral 93 18 100 % - -     Physical Exam 1235: Physical examination:  Nursing notes reviewed; Vital signs and O2 SAT reviewed;  Constitutional: Well developed, Well nourished, Well hydrated, In no acute distress; Head:  Normocephalic, atraumatic; Eyes: EOMI, PERRL, No scleral icterus; ENMT: Mouth and pharynx normal, Mucous membranes moist; Neck: Supple, Full range of motion, No lymphadenopathy; Cardiovascular: Regular rate and rhythm, No gallop; Respiratory: Breath sounds clear & equal bilaterally, No wheezes.  Speaking full sentences with ease, Normal respiratory effort/excursion; Chest: Nontender, Movement normal; Abdomen: Soft, morbidly obese. +LUQ, left lateral torso tender to palp. Nondistended, Normal bowel sounds; Genitourinary: No CVA tenderness; Spine:  No midline CS, TS, LS tenderness. +TTP left lumbar paraspinal muscles. ;;  Extremities: Peripheral pulses normal, No tenderness, +chronic bilat LE's edema, No calf asymmetry.; Neuro: AA&Ox3, Major CN grossly intact.  Speech clear. No gross focal motor or sensory deficits in extremities.; Skin: Color normal, Warm, Dry.     ED Treatments / Results  Labs (all labs ordered are listed, but only abnormal results are displayed)   EKG None  Radiology   Procedures Procedures (including critical care time)  Medications Ordered in ED Medications    famotidine (PEPCID) IVPB 20 mg premix (has no administration in time range)  ondansetron (ZOFRAN) injection 4 mg (has no administration in time range)  fentaNYL (SUBLIMAZE) injection 50 mcg (has no administration in time range)     Initial Impression / Assessment and Plan / ED Course  I have reviewed the triage vital signs and the nursing notes.  Pertinent labs & imaging results that were available during my care of the patient were reviewed by me and considered in my medical decision making (see chart for details).  MDM Reviewed: previous chart, nursing note and vitals Reviewed previous: labs Interpretation: labs and CT scan    Results for orders placed or performed during the hospital encounter of 02/04/18  Comprehensive metabolic panel  Result Value Ref Range   Sodium 137 135 - 145 mmol/L   Potassium 3.9 3.5 - 5.1 mmol/L   Chloride 103 98 - 111 mmol/L   CO2 27 22 - 32 mmol/L   Glucose, Bld 310 (H) 70 - 99 mg/dL   BUN 12 6 - 20 mg/dL   Creatinine, Ser 1.61 0.44 - 1.00 mg/dL   Calcium 8.7 (L) 8.9 - 10.3 mg/dL   Total Protein 7.5 6.5 - 8.1 g/dL   Albumin 3.4 (L) 3.5 - 5.0 g/dL   AST 17 15 - 41 U/L   ALT 21 0 - 44 U/L   Alkaline Phosphatase 81 38 - 126 U/L   Total Bilirubin 0.5 0.3 - 1.2 mg/dL   GFR calc non Af Amer >60 >60 mL/min   GFR calc Af Amer >60 >60 mL/min   Anion gap 7 5 - 15  Lipase, blood  Result Value Ref Range   Lipase 32 11 - 51 U/L  CBC with Differential  Result Value Ref Range   WBC 6.9 4.0 - 10.5 K/uL   RBC 4.71 3.87 - 5.11 MIL/uL   Hemoglobin 13.2 12.0 - 15.0 g/dL   HCT 09.6 04.5 - 40.9 %   MCV 86.4 78.0 - 100.0 fL   MCH 28.0 26.0 - 34.0 pg   MCHC  32.4 30.0 - 36.0 g/dL   RDW 69.6 29.5 - 28.4 %   Platelets 202 150 - 400 K/uL   Neutrophils Relative % 66 %   Neutro Abs 4.6 1.7 - 7.7 K/uL   Lymphocytes Relative 27 %   Lymphs Abs 1.9 0.7 - 4.0 K/uL   Monocytes Relative 5 %   Monocytes Absolute 0.3 0.1 - 1.0 K/uL   Eosinophils Relative 2 %    Eosinophils Absolute 0.1 0.0 - 0.7 K/uL   Basophils Relative 0 %   Basophils Absolute 0.0 0.0 - 0.1 K/uL  Urinalysis, Routine w reflex microscopic  Result Value Ref Range   Color, Urine YELLOW YELLOW   APPearance CLEAR CLEAR   Specific Gravity, Urine 1.024 1.005 - 1.030   pH 6.0 5.0 - 8.0   Glucose, UA >=500 (A) NEGATIVE mg/dL   Hgb urine dipstick NEGATIVE NEGATIVE   Bilirubin Urine NEGATIVE NEGATIVE   Ketones, ur NEGATIVE NEGATIVE mg/dL   Protein, ur NEGATIVE NEGATIVE mg/dL   Nitrite NEGATIVE NEGATIVE   Leukocytes, UA NEGATIVE NEGATIVE   RBC / HPF 0-5 0 - 5 RBC/hpf   WBC, UA 0-5 0 - 5 WBC/hpf   Bacteria, UA NONE SEEN NONE SEEN   Squamous Epithelial / LPF 0-5 0 - 5   Mucus PRESENT   I-Stat beta hCG blood, ED  Result Value Ref Range   I-stat hCG, quantitative <5.0 <5 mIU/mL   Comment 3            Nm Gastric Emptying Result Date: 01/16/2018 CLINICAL DATA:  Nausea, vomiting EXAM: NUCLEAR MEDICINE GASTRIC EMPTYING SCAN TECHNIQUE: After oral ingestion of radiolabeled meal, sequential abdominal images were obtained for 120 minutes. Residual percentage of activity remaining within the stomach was calculated at 60 and 120 minutes. RADIOPHARMACEUTICALS:  2 mCi Tc-94m sulfur colloid in standardized meal COMPARISON:  None. FINDINGS: Expected location of the stomach in the left upper quadrant. At 1 hour, patient vomited, with vomit containing a portion of the ingested dose. Procedure was therefore terminated since accurate gastric emptying could not be subsequently calculated. IMPRESSION: Vomiting a portion of the ingested tracer at 1 hour leading to termination of the procedure. Electronically Signed   By: Ulyses Southward M.D.   On: 01/16/2018 10:58   Dg Abd Acute W/chest Result Date: 02/04/2018 CLINICAL DATA:  Left flank pain for 2 days. EXAM: DG ABDOMEN ACUTE W/ 1V CHEST COMPARISON:  None. FINDINGS: Haziness over the left base most consistent with atelectasis. The chest is otherwise normal. No free  air, portal venous gas, or pneumatosis. A calcification the right upper quadrant is consistent with known cholelithiasis. No renal stones are noted. No ureteral stones identified. There is relative paucity of bowel gas but no evidence of obstruction. No other acute abnormalities. IMPRESSION: Mild atelectasis in the left lung base. Cholelithiasis. No other abnormalities noted. Electronically Signed   By: Gerome Sam III M.D   On: 02/04/2018 15:06    1550: CBG elevated with hx of same, AG normal; IVF given.  Pt has ambulated with steady gait, easy resps, NAD. Pt has tol PO well while in the ED without N/V.  No stooling while in the ED.  Feels better and wants to go home now. Tx symptomatically at this time. Pt encouraged to f/u with his PMD and GI MD for good continuity of care and control of her chronic recurrent symptoms.  Pt verb understanding.  Dx and testing d/w pt.  Questions answered.  Verb understanding, agreeable to d/c  home with outpt f/u.        Final Clinical Impressions(s) / ED Diagnoses   Final diagnoses:  None    ED Discharge Orders    None       Samuel Jester, DO 02/07/18 2115

## 2018-03-16 ENCOUNTER — Inpatient Hospital Stay (HOSPITAL_COMMUNITY): Payer: Medicaid Other

## 2018-03-16 ENCOUNTER — Other Ambulatory Visit: Payer: Self-pay

## 2018-03-16 ENCOUNTER — Encounter (HOSPITAL_COMMUNITY): Payer: Self-pay

## 2018-03-16 ENCOUNTER — Inpatient Hospital Stay (HOSPITAL_COMMUNITY)
Admission: EM | Admit: 2018-03-16 | Discharge: 2018-03-19 | DRG: 872 | Disposition: A | Discharge status: Home / Self Care | Payer: Medicaid Other | Attending: Internal Medicine | Admitting: Internal Medicine

## 2018-03-16 DIAGNOSIS — R739 Hyperglycemia, unspecified: Secondary | ICD-10-CM

## 2018-03-16 DIAGNOSIS — E119 Type 2 diabetes mellitus without complications: Secondary | ICD-10-CM

## 2018-03-16 DIAGNOSIS — I890 Lymphedema, not elsewhere classified: Secondary | ICD-10-CM | POA: Diagnosis present

## 2018-03-16 DIAGNOSIS — E039 Hypothyroidism, unspecified: Secondary | ICD-10-CM | POA: Diagnosis present

## 2018-03-16 DIAGNOSIS — A419 Sepsis, unspecified organism: Secondary | ICD-10-CM | POA: Diagnosis present

## 2018-03-16 DIAGNOSIS — G4730 Sleep apnea, unspecified: Secondary | ICD-10-CM | POA: Diagnosis present

## 2018-03-16 DIAGNOSIS — E1143 Type 2 diabetes mellitus with diabetic autonomic (poly)neuropathy: Secondary | ICD-10-CM | POA: Diagnosis present

## 2018-03-16 DIAGNOSIS — Z888 Allergy status to other drugs, medicaments and biological substances status: Secondary | ICD-10-CM | POA: Diagnosis not present

## 2018-03-16 DIAGNOSIS — Z833 Family history of diabetes mellitus: Secondary | ICD-10-CM

## 2018-03-16 DIAGNOSIS — Z6841 Body Mass Index (BMI) 40.0 and over, adult: Secondary | ICD-10-CM

## 2018-03-16 DIAGNOSIS — Z794 Long term (current) use of insulin: Secondary | ICD-10-CM

## 2018-03-16 DIAGNOSIS — Z79890 Hormone replacement therapy (postmenopausal): Secondary | ICD-10-CM | POA: Diagnosis not present

## 2018-03-16 DIAGNOSIS — L0390 Cellulitis, unspecified: Secondary | ICD-10-CM | POA: Diagnosis present

## 2018-03-16 DIAGNOSIS — E871 Hypo-osmolality and hyponatremia: Secondary | ICD-10-CM | POA: Diagnosis present

## 2018-03-16 DIAGNOSIS — F329 Major depressive disorder, single episode, unspecified: Secondary | ICD-10-CM | POA: Diagnosis present

## 2018-03-16 DIAGNOSIS — Z885 Allergy status to narcotic agent status: Secondary | ICD-10-CM

## 2018-03-16 DIAGNOSIS — K219 Gastro-esophageal reflux disease without esophagitis: Secondary | ICD-10-CM | POA: Diagnosis not present

## 2018-03-16 DIAGNOSIS — Z79899 Other long term (current) drug therapy: Secondary | ICD-10-CM

## 2018-03-16 DIAGNOSIS — F419 Anxiety disorder, unspecified: Secondary | ICD-10-CM | POA: Diagnosis present

## 2018-03-16 DIAGNOSIS — Z91040 Latex allergy status: Secondary | ICD-10-CM | POA: Diagnosis not present

## 2018-03-16 DIAGNOSIS — L03115 Cellulitis of right lower limb: Secondary | ICD-10-CM | POA: Diagnosis not present

## 2018-03-16 DIAGNOSIS — K3184 Gastroparesis: Secondary | ICD-10-CM | POA: Diagnosis present

## 2018-03-16 DIAGNOSIS — Z882 Allergy status to sulfonamides status: Secondary | ICD-10-CM | POA: Diagnosis not present

## 2018-03-16 DIAGNOSIS — E1165 Type 2 diabetes mellitus with hyperglycemia: Secondary | ICD-10-CM | POA: Diagnosis present

## 2018-03-16 DIAGNOSIS — R112 Nausea with vomiting, unspecified: Secondary | ICD-10-CM | POA: Diagnosis present

## 2018-03-16 DIAGNOSIS — R609 Edema, unspecified: Secondary | ICD-10-CM

## 2018-03-16 DIAGNOSIS — E038 Other specified hypothyroidism: Secondary | ICD-10-CM | POA: Diagnosis not present

## 2018-03-16 LAB — URINALYSIS, ROUTINE W REFLEX MICROSCOPIC
BACTERIA UA: NONE SEEN
BILIRUBIN URINE: NEGATIVE
Glucose, UA: >=500 mg/dL — AB
HGB URINE DIPSTICK: NEGATIVE
KETONES UR: NEGATIVE mg/dL
LEUKOCYTES UA: NEGATIVE
NITRITE: NEGATIVE
PROTEIN: 30 mg/dL — AB
SPECIFIC GRAVITY, URINE: 1.028 (ref 1.005–1.030)
pH: 5 (ref 5.0–8.0)

## 2018-03-16 LAB — BASIC METABOLIC PANEL
Anion gap: 8 (ref 5–15)
BUN: 10 mg/dL (ref 6–20)
CALCIUM: 8.7 mg/dL — AB (ref 8.9–10.3)
CO2: 24 mmol/L (ref 22–32)
Chloride: 101 mmol/L (ref 98–111)
Creatinine, Ser: 0.87 mg/dL (ref 0.44–1.00)
GFR calc Af Amer: >60 mL/min (ref >60)
GLUCOSE: 346 mg/dL — AB (ref 70–99)
POTASSIUM: 3.7 mmol/L (ref 3.5–5.1)
Sodium: 133 mmol/L — ABNORMAL LOW (ref 135–145)

## 2018-03-16 LAB — GLUCOSE, CAPILLARY
GLUCOSE-CAPILLARY: 331 mg/dL — AB (ref 70–99)
GLUCOSE-CAPILLARY: 274 mg/dL — AB (ref 70–99)
GLUCOSE-CAPILLARY: 254 mg/dL — AB (ref 70–99)

## 2018-03-16 LAB — I-STAT BETA HCG BLOOD, ED (MC, WL, AP ONLY)

## 2018-03-16 LAB — CBC WITH DIFFERENTIAL/PLATELET
Basophils Absolute: 0 10*3/uL (ref 0.0–0.1)
Basophils Relative: 0 %
EOS PCT: 1 %
Eosinophils Absolute: 0.1 10*3/uL (ref 0.0–0.7)
HEMATOCRIT: 43.1 % (ref 36.0–46.0)
Hemoglobin: 13.9 g/dL (ref 12.0–15.0)
LYMPHS PCT: 10 %
Lymphs Abs: 1.1 10*3/uL (ref 0.7–4.0)
MCH: 28.3 pg (ref 26.0–34.0)
MCHC: 32.3 g/dL (ref 30.0–36.0)
MCV: 87.6 fL (ref 78.0–100.0)
MONO ABS: 0.4 10*3/uL (ref 0.1–1.0)
MONOS PCT: 4 %
Neutro Abs: 10.2 10*3/uL — ABNORMAL HIGH (ref 1.7–7.7)
Neutrophils Relative %: 85 %
Platelets: 202 10*3/uL (ref 150–400)
RBC: 4.92 MIL/uL (ref 3.87–5.11)
RDW: 13.9 % (ref 11.5–15.5)
WBC: 11.9 10*3/uL — ABNORMAL HIGH (ref 4.0–10.5)

## 2018-03-16 LAB — I-STAT CG4 LACTIC ACID, ED: Lactic Acid, Venous: 3 mmol/L (ref 0.5–1.9)

## 2018-03-16 LAB — LACTIC ACID, PLASMA: Lactic Acid, Venous: 2.1 mmol/L (ref 0.5–1.9)

## 2018-03-16 MED ORDER — IBUPROFEN 400 MG PO TABS
400.0000 mg | ORAL_TABLET | Freq: Once | ORAL | Status: AC
  Administered 2018-03-16: 400 mg via ORAL
  Filled 2018-03-16: qty 1

## 2018-03-16 MED ORDER — SIMETHICONE 40 MG/0.6ML PO SUSP
ORAL | Status: AC
  Filled 2018-03-16: qty 0.6

## 2018-03-16 MED ORDER — ONDANSETRON HCL 4 MG/2ML IJ SOLN
4.0000 mg | Freq: Once | INTRAMUSCULAR | Status: AC
  Administered 2018-03-16: 4 mg via INTRAVENOUS
  Filled 2018-03-16: qty 2

## 2018-03-16 MED ORDER — LEVOTHYROXINE SODIUM 50 MCG PO TABS
50.0000 ug | ORAL_TABLET | Freq: Every day | ORAL | Status: DC
  Administered 2018-03-17 – 2018-03-19 (×3): 50 ug via ORAL
  Filled 2018-03-16 (×4): qty 1

## 2018-03-16 MED ORDER — LACTATED RINGERS IV BOLUS (SEPSIS)
200.0000 mL | Freq: Once | INTRAVENOUS | Status: AC
  Administered 2018-03-16: 200 mL via INTRAVENOUS

## 2018-03-16 MED ORDER — VANCOMYCIN HCL 10 G IV SOLR
1500.0000 mg | Freq: Three times a day (TID) | INTRAVENOUS | Status: DC
  Administered 2018-03-16 – 2018-03-19 (×9): 1500 mg via INTRAVENOUS
  Filled 2018-03-16 (×14): qty 1500

## 2018-03-16 MED ORDER — FENTANYL CITRATE (PF) 100 MCG/2ML IJ SOLN
100.0000 ug | Freq: Once | INTRAMUSCULAR | Status: AC
  Administered 2018-03-16: 100 ug via INTRAVENOUS
  Filled 2018-03-16: qty 2

## 2018-03-16 MED ORDER — SODIUM CHLORIDE 0.9 % IV SOLN
INTRAVENOUS | Status: DC
  Administered 2018-03-16 – 2018-03-17 (×2): via INTRAVENOUS

## 2018-03-16 MED ORDER — FLUOXETINE HCL 20 MG PO CAPS
20.0000 mg | ORAL_CAPSULE | Freq: Every day | ORAL | Status: DC
  Administered 2018-03-16 – 2018-03-19 (×4): 20 mg via ORAL
  Filled 2018-03-16 (×4): qty 1

## 2018-03-16 MED ORDER — INSULIN GLARGINE 100 UNIT/ML ~~LOC~~ SOLN
65.0000 [IU] | Freq: Every day | SUBCUTANEOUS | Status: DC
  Administered 2018-03-16 – 2018-03-18 (×3): 65 [IU] via SUBCUTANEOUS
  Filled 2018-03-16 (×4): qty 0.65

## 2018-03-16 MED ORDER — VANCOMYCIN HCL 10 G IV SOLR
1500.0000 mg | Freq: Once | INTRAVENOUS | Status: AC
  Administered 2018-03-16: 1500 mg via INTRAVENOUS
  Filled 2018-03-16 (×2): qty 1500

## 2018-03-16 MED ORDER — ENOXAPARIN SODIUM 120 MG/0.8ML ~~LOC~~ SOLN
120.0000 mg | SUBCUTANEOUS | Status: DC
  Administered 2018-03-16 – 2018-03-19 (×4): 120 mg via SUBCUTANEOUS
  Filled 2018-03-16 (×4): qty 0.8

## 2018-03-16 MED ORDER — FENTANYL CITRATE (PF) 100 MCG/2ML IJ SOLN
50.0000 ug | Freq: Once | INTRAMUSCULAR | Status: AC
  Administered 2018-03-16: 50 ug via INTRAVENOUS
  Filled 2018-03-16: qty 2

## 2018-03-16 MED ORDER — ACETAMINOPHEN 500 MG PO TABS
1000.0000 mg | ORAL_TABLET | Freq: Four times a day (QID) | ORAL | Status: DC | PRN
  Administered 2018-03-16 (×2): 1000 mg via ORAL
  Filled 2018-03-16 (×2): qty 2

## 2018-03-16 MED ORDER — INSULIN ASPART 100 UNIT/ML ~~LOC~~ SOLN
0.0000 [IU] | Freq: Three times a day (TID) | SUBCUTANEOUS | Status: DC
  Administered 2018-03-16: 8 [IU] via SUBCUTANEOUS
  Administered 2018-03-16: 11 [IU] via SUBCUTANEOUS
  Administered 2018-03-17 – 2018-03-18 (×4): 5 [IU] via SUBCUTANEOUS
  Administered 2018-03-18: 2 [IU] via SUBCUTANEOUS
  Administered 2018-03-18: 3 [IU] via SUBCUTANEOUS
  Administered 2018-03-19: 2 [IU] via SUBCUTANEOUS

## 2018-03-16 MED ORDER — INSULIN ASPART 100 UNIT/ML ~~LOC~~ SOLN: 0.0000 [IU] | Freq: Every day | SUBCUTANEOUS | Status: DC

## 2018-03-16 MED ORDER — ACETAMINOPHEN 325 MG PO TABS
650.0000 mg | ORAL_TABLET | Freq: Once | ORAL | Status: AC
  Administered 2018-03-16: 650 mg via ORAL
  Filled 2018-03-16: qty 2

## 2018-03-16 MED ORDER — LACTATED RINGERS IV BOLUS (SEPSIS)
1000.0000 mL | Freq: Once | INTRAVENOUS | Status: AC
  Administered 2018-03-16: 1000 mL via INTRAVENOUS

## 2018-03-16 MED ORDER — METOCLOPRAMIDE HCL 10 MG PO TABS
10.0000 mg | ORAL_TABLET | Freq: Four times a day (QID) | ORAL | Status: DC | PRN
  Administered 2018-03-16 – 2018-03-19 (×6): 10 mg via ORAL
  Filled 2018-03-16 (×6): qty 1

## 2018-03-16 MED ORDER — TRAMADOL HCL 50 MG PO TABS
100.0000 mg | ORAL_TABLET | Freq: Four times a day (QID) | ORAL | Status: DC | PRN
  Administered 2018-03-16 – 2018-03-19 (×2): 100 mg via ORAL
  Filled 2018-03-16 (×4): qty 2

## 2018-03-16 MED ORDER — INSULIN ASPART 100 UNIT/ML ~~LOC~~ SOLN: 0.0000 [IU] | Freq: Three times a day (TID) | SUBCUTANEOUS | Status: DC

## 2018-03-16 MED ORDER — INSULIN ASPART 100 UNIT/ML ~~LOC~~ SOLN
0.0000 [IU] | Freq: Every day | SUBCUTANEOUS | Status: DC
  Administered 2018-03-16: 3 [IU] via SUBCUTANEOUS
  Administered 2018-03-17: 2 [IU] via SUBCUTANEOUS

## 2018-03-16 MED ORDER — ENOXAPARIN SODIUM 40 MG/0.4ML ~~LOC~~ SOLN
40.0000 mg | SUBCUTANEOUS | Status: DC
  Filled 2018-03-16: qty 0.4

## 2018-03-16 MED ORDER — FAMOTIDINE 20 MG PO TABS
20.0000 mg | ORAL_TABLET | Freq: Two times a day (BID) | ORAL | Status: DC
  Administered 2018-03-16 – 2018-03-19 (×7): 20 mg via ORAL
  Filled 2018-03-16 (×7): qty 1

## 2018-03-16 NOTE — H&P (Addendum)
History and Physical    Brittany [name] WUJ:[id number] DOB: 09-26-1980 DOA: 03/16/2018  PCP: Gwenlyn Fudge, FNP  Patient coming from: Home.   I have personally briefly reviewed patient's old medical records in [facility]  Chief Complaint:  Right leg pain.   HPI: Brittany [name] is a 37 y.o. female with medical history significant of  Insulin dependent DM, recurrent cellulitis of the left leg, gastroparesis, lymphaedema, GERD, sleep apnea,depression, hypothyroidism, comes in to [facility] ED for persistent right leg pain and tenderness since last night associated with fever and chills. She reports she had cellulitis of the left leg three times in the last one year. She denies any injury to the right leg. Pt denies any chest pain or sob, palpitations, syncope, headache, tingling or numbness of the lower or upper extremities, any weakness, cough, . She reports chronic nausea, vomiting, and abdominal bloating and tenderness on and off, associated with metformin use and also attributes partly to the gastroparesis. She denies urinary symptoms and diarrhea.    ED Course: on arrival to ED, she was found to be febrile, tachycardic, tachypnea, normotensive, with elevated wbc [place] of 956[address], hemoglobin of 13.9, sodium of 133 and blood glucose of 346, UA is negative for infection. Lactic acid of 3.   She was referred to medical service for admission for evaluation of cellulitis and sepsis .  Review of Systems: As per HPI otherwise 10 point review of systems negative.   Past Medical History:  Diagnosis Date  . Anxiety   . Carpal tunnel syndrome on left 10/12/2016  . Cellulitis and abscess of leg   . Chronic abdominal pain   . Depression   . Diabetes mellitus without complication (HCC)   . Gallstones   . GERD (gastroesophageal reflux disease) 08/13/2017  . Headache   . Hypothyroid 01/03/2018  . Lymphedema   . Sleep apnea    DX with sleep apnea - unable to use c-pap  . Umbilical hernia     Past  Surgical History:  Procedure Laterality Date  . CESAREAN SECTION  11/09/01  . HERNIA REPAIR    . INSERTION OF MESH N/A 01/02/2013   Procedure: INSERTION OF MESH;  Surgeon: Lodema Pilot, DO;  Location: [facility];  Service: General;  Laterality: N/A;  . UMBILICAL HERNIA REPAIR N/A 01/02/2013   Procedure: LAPAROSCOPIC UMBILICAL HERNIA;  Surgeon: Lodema Pilot, DO;  Location: [facility];  Service: General;  Laterality: N/A;     reports that she has never smoked. She has never used smokeless tobacco. She reports that she does not drink alcohol or use drugs.  Allergies  Allergen Reactions  . Paxil [Paroxetine Hcl]     Suicidal thoughts  . Morphine And Related Other (See Comments)    "it just freaks me out"  . Latex Itching, Swelling and Rash  . Sulfa Antibiotics Nausea And Vomiting and Rash    Family History  Problem Relation Age of Onset  . Cancer Mother        leukemia  . Diabetes Mother   . Hypertension Mother   . Hyperlipidemia Mother   . Other Father        Sciatica, kidney stones  . Alcohol abuse Paternal Grandfather   . Other Paternal Grandmother        bowel obstruction; pneumonia  . Hypertension Maternal Grandmother   . Diabetes Maternal Grandmother   . Cancer Maternal Grandfather        lung  . ADD / ADHD Daughter   .  Depression Daughter   . Anxiety disorder Daughter   re viewed and pertinent.   Prior to Admission medications   Medication Sig Start Date End Date Taking? Authorizing Provider  acetaminophen (TYLENOL) 500 MG tablet Take 1,000 mg by mouth every 6 (six) hours as needed for mild pain.    [provider]  famotidine (PEPCID) 20 MG tablet Take 1 tablet (20 mg total) by mouth 2 (two) times daily. 02/04/18   Samuel Jester, DO  FLUoxetine (PROZAC) 20 MG capsule Take 20 mg by mouth daily.    [provider]  insulin aspart (NOVOLOG) 100 UNIT/ML injection SLIDING SCALE 3 TIMES DAILY WITH MEALS: For blood sugar 70-120-no insulin. Blood sugar 121-150  take 5 units. Blood sugar 151-208 take 8 units. Blood sugar 201-250 take 12 units. Blood sugar 251-300 take 17 units. Blood sugar 301-350 take 20 units. Blood sugar 351-400 take 25 units. Blood sugar greater than 400 take 28 units and call your doctor. 03/27/16   Devoria Albe, MD  insulin glargine (LANTUS) 100 UNIT/ML injection Inject 0.65 mLs (65 Units total) into the skin at bedtime. 08/13/17   Dhungel, Theda Belfast, MD  levothyroxine (SYNTHROID) 50 MCG tablet Take 1 tablet (50 mcg total) by mouth daily before breakfast. 01/03/18   Tilda Burrow, MD  metFORMIN (GLUCOPHAGE-XR) 500 MG 24 hr tablet Take 500 mg by mouth 2 (two) times daily.    [provider]  metoCLOPramide (REGLAN) 10 MG tablet Take 1 tablet (10 mg total) by mouth every 6 (six) hours as needed for nausea or vomiting. 02/04/18   Samuel Jester, DO  naproxen sodium (ALEVE) 220 MG tablet Take 440 mg by mouth daily as needed (pain).    [provider]    Physical Exam: Vitals:   03/16/18 0503 03/16/18 0504 03/16/18 0651 03/16/18 0700  BP: 116/90  129/84 125/71  Pulse: (!) 117  (!) 127 (!) 125  Resp: (!) 22  18 17   Temp: 100.3 F (37.9 C)  (!) 102.7 F (39.3 C)   TempSrc: Oral  Oral   SpO2: 96%  98% 95%  Weight:  (!) 244.9 kg    Height:  5\' 11"  (1.803 m)      Constitutional: NAD, calm, comfortable Vitals:   03/16/18 0503 03/16/18 0504 03/16/18 0651 03/16/18 0700  BP: 116/90  129/84 125/71  Pulse: (!) 117  (!) 127 (!) 125  Resp: (!) 22  18 17   Temp: 100.3 F (37.9 C)  (!) 102.7 F (39.3 C)   TempSrc: Oral  Oral   SpO2: 96%  98% 95%  Weight:  (!) 244.9 kg    Height:  5\' 11"  (1.803 m)     Eyes: PERRL, lids and conjunctivae normal ENMT: Mucous membranes are moist. Respiratory: clear to auscultation bilaterally, no wheezing, no crackles. Normal respiratory effort. No accessory muscle use.  Cardiovascular: Regular rate and rhythm, no murmurs / rubs / gallops. Abdomen: no tenderness, no masses palpated. No  hepatosplenomegaly. Bowel sounds positive.  Musculoskeletal: bilateral lymphedema with right lower extremity tenderness and erythema.  Skin: no rashes, lesions, ulcers. No induration Neurologic: CN 2-12 grossly intact. Sensation intact, DTR normal. Strength 5/5 in all 4.  Psychiatric: Normal judgment and insight. Alert and oriented x 3. Normal mood.    Labs on Admission: I have personally reviewed following labs and imaging studies  CBC: Recent Labs  Lab 03/16/18 0602  WBC 11.9*  NEUTROABS 10.2*  HGB 13.9  HCT 43.1  MCV 87.6  PLT 202  Basic Metabolic Panel: Recent Labs  Lab 03/16/18 0602  NA 133*  K 3.7  CL 101  CO2 24  GLUCOSE 346*  BUN 10  CREATININE 0.87  CALCIUM 8.7*   GFR: Estimated Creatinine Clearance: 192.4 mL/min (by C-G formula based on SCr of 0.87 mg/dL). Liver Function Tests: No results for input(s): AST, ALT, ALKPHOS, BILITOT, PROT, ALBUMIN in the last 168 hours. No results for input(s): LIPASE, AMYLASE in the last 168 hours. No results for input(s): AMMONIA in the last 168 hours. Coagulation Profile: No results for input(s): INR, PROTIME in the last 168 hours. Cardiac Enzymes: No results for input(s): CKTOTAL, CKMB, CKMBINDEX, TROPONINI in the last 168 hours. BNP (last 3 results) No results for input(s): PROBNP in the last 8760 hours. HbA1C: No results for input(s): HGBA1C in the last 72 hours. CBG: No results for input(s): GLUCAP in the last 168 hours. Lipid Profile: No results for input(s): CHOL, HDL, LDLCALC, TRIG, CHOLHDL, LDLDIRECT in the last 72 hours. Thyroid Function Tests: No results for input(s): TSH, T4TOTAL, FREET4, T3FREE, THYROIDAB in the last 72 hours. Anemia Panel: No results for input(s): VITAMINB12, FOLATE, FERRITIN, TIBC, IRON, RETICCTPCT in the last 72 hours. Urine analysis:    Component Value Date/Time   COLORURINE YELLOW 03/16/2018 0615   APPEARANCEUR CLEAR 03/16/2018 0615   LABSPEC 1.028 03/16/2018 0615   PHURINE 5.0  03/16/2018 0615   GLUCOSEU >=500 (A) 03/16/2018 0615   HGBUR NEGATIVE 03/16/2018 0615   BILIRUBINUR NEGATIVE 03/16/2018 0615   KETONESUR NEGATIVE 03/16/2018 0615   PROTEINUR 30 (A) 03/16/2018 0615   UROBILINOGEN 0.2 02/22/2013 0414   NITRITE NEGATIVE 03/16/2018 0615   LEUKOCYTESUR NEGATIVE 03/16/2018 0615    Radiological Exams on Admission: No results found.  EKG:Not done at this time.   Assessment/Plan Active Problems:   Sepsis due to cellulitis (HCC)     Sepsis from cellulitis of the right lower extremity.  Patient on admission, was febrile, tachycardic, tachypnea, elevated lactic acid and cellulitis of the right lower extremity.  Blood cultures done on admission and follow them.  Started her on IV vancomycin and resume the same.  Trend the lactate.  Hydrate and get venous duplex of the right lower extremity.  Elevate the right lower extremity.  Pain control.    Uncontrolled DM with hyperglycemia:  Pt reports her last A1c is 10, improved form 12.  CBG (last 3)  No results for input(s): GLUCAP in the last 72 hours.  Resume her lantus and SSI.  Hold metformin for the elevated lactic acid.    Gastroparesis: Resume reglan.    GERD: STABLE., resume pepcid.   Hypothyroidism: Resume synthroid.    Morbid obesity: Recommend outpatient follow up with PCP with life style modification, diet and exercise.    Depression:  No suicidal ideations.  Resume home meds.    H/o Sleep apnea:  Pt reports not able to tolerate CPAP.     H/o bilateral chronic lymphedema:  Pt reports following up with lymphedema specialist, recommended to use compression stockings and pumps, but pt could not use them.       DVT prophylaxis:lovenox.  Code Status:full code.  Family Communication: none at bedside.  Disposition Plan:  Pending clinical improvement, hopefully in 1 to 2 days.  Consults called: none.  Admission status:  Telemetry.    Kathlen Mody MD [facility] Pager [phone]   If 7PM-7AM, please contact night-coverage [email or web address] Password [facility]  03/16/2018, 7:47 AM

## 2018-03-16 NOTE — ED Triage Notes (Signed)
Pt reports pain to the underside of her right lower leg, states it started approx 2 hours ago.  No redness or irritation noted.

## 2018-03-16 NOTE — Progress Notes (Signed)
Pharmacy Antibiotic Note  Brittany [name] is a 37 y.o. female admitted on 03/16/2018 with cellulitis.  Pharmacy has been consulted for vancomycin dosing. Lactic acid > 3 upon admission, patient has failed outpatient abx, seems to be severe cellulitis due to existing co morbidities.   Plan: Vancomycin 1500mg   IV every 8 hours.  Goal trough 15-20 mcg/mL.  Height: 5\' 11"  (180.3 cm) Weight: (!) 540 lb (244.9 kg) IBW/kg (Calculated) : 70.8  Temp (24hrs), Avg:101.5 F (38.6 C), Min:100.3 F (37.9 C), Max:102.7 F (39.3 C)  Recent Labs  Lab 03/16/18 0602 03/16/18 0610  WBC 11.9*  --   CREATININE 0.87  --   LATICACIDVEN  --  3.00*    Estimated Creatinine Clearance: 192.4 mL/min (by C-G formula based on SCr of 0.87 mg/dL).    Allergies  Allergen Reactions  . Paxil [Paroxetine Hcl]     Suicidal thoughts  . Morphine And Related Other (See Comments)    "it just freaks me out"  . Latex Itching, Swelling and Rash  . Sulfa Antibiotics Nausea And Vomiting and Rash    Antimicrobials this admission: 9/6 vancomycin >>    Microbiology results: 9/6 BCx: ngtd   Thank you for allowing pharmacy to be a part of this patient's care.  Gerre Pebbles Brittany [name] 03/16/2018 8:02 AM

## 2018-03-16 NOTE — ED Notes (Signed)
Dr. Blake Divine at bedside doing assessment at this time. Will Transfer to 324 when done.

## 2018-03-16 NOTE — ED Provider Notes (Signed)
[facility] EMERGENCY DEPARTMENT Provider Note   CSN: [id number] Arrival date & time: 03/16/18  0450     History   Chief Complaint Chief Complaint  Patient presents with  . Leg Pain    HPI Brittany [name] is a 37 y.o. female.  The history is provided by the patient and a significant other.  Leg Pain   This is a new problem. The current episode started 1 to 2 hours ago. The problem occurs constantly. The problem has been rapidly worsening. The pain is present in the right lower leg. The quality of the pain is described as sharp. The pain is severe. Associated symptoms include limited range of motion. She has tried nothing for the symptoms. The treatment provided no relief. There has been no history of extremity trauma.  Pt with History of anxiety, diabetes, morbid obesity, history of recurrent cellulitis, presents with right lower extremity pain.  She denies trauma.  She reports she went to bed feeling well, woke up with significant pain below her knee.  This feels similar to prior episodes of cellulitis, but she typically has in her left leg.  She reports fevers and chills.  Past Medical History:  Diagnosis Date  . Anxiety   . Carpal tunnel syndrome on left 10/12/2016  . Cellulitis and abscess of leg   . Chronic abdominal pain   . Depression   . Diabetes mellitus without complication (HCC)   . Gallstones   . GERD (gastroesophageal reflux disease) 08/13/2017  . Headache   . Hypothyroid 01/03/2018  . Lymphedema   . Sleep apnea    DX with sleep apnea - unable to use c-pap  . Umbilical hernia     Patient Active Problem List   Diagnosis Date Noted  . Hypothyroid 01/03/2018  . GERD (gastroesophageal reflux disease) 08/13/2017  . Uncontrolled type 2 diabetes mellitus with hyperglycemia, with long-term current use of insulin (HCC) 08/13/2017  . Sepsis (HCC) 08/11/2017  . Chronic abdominal pain   . Pelvic lymphadenopathy   . Splenomegaly   . Non-intractable vomiting with nausea     . Encounter for IUD removal 04/20/2017  . Pressure injury of skin 10/24/2016  . Cellulitis and abscess of leg 10/23/2016  . Sleep apnea 10/23/2016  . Diabetes mellitus without complication (HCC) 10/23/2016  . Morbid obesity with body mass index (BMI) greater than or equal to 70 in adult [facility]) 10/23/2016  . Carpal tunnel syndrome on left 10/12/2016  . Candidal intertrigo 12/31/2014  . Morbid obesity (HCC) 12/29/2014  . Hyponatremia 12/29/2014  . Cellulitis of left lower extremity 12/29/2014    Past Surgical History:  Procedure Laterality Date  . CESAREAN SECTION  11/09/01  . HERNIA REPAIR    . INSERTION OF MESH N/A 01/02/2013   Procedure: INSERTION OF MESH;  Surgeon: Lodema Pilot, DO;  Location: [facility];  Service: General;  Laterality: N/A;  . UMBILICAL HERNIA REPAIR N/A 01/02/2013   Procedure: LAPAROSCOPIC UMBILICAL HERNIA;  Surgeon: Lodema Pilot, DO;  Location: [facility];  Service: General;  Laterality: N/A;     OB History    Gravida  4   Para  1   Term  1   Preterm      AB  2   Living  1     SAB  2   TAB      Ectopic      Multiple      Live Births  1  Home Medications    Prior to Admission medications   Medication Sig Start Date End Date Taking? Authorizing Provider  acetaminophen (TYLENOL) 500 MG tablet Take 1,000 mg by mouth every 6 (six) hours as needed for mild pain.    [provider]  famotidine (PEPCID) 20 MG tablet Take 1 tablet (20 mg total) by mouth 2 (two) times daily. 02/04/18   Samuel Jester, DO  FLUoxetine (PROZAC) 20 MG capsule Take 20 mg by mouth daily.    [provider]  insulin aspart (NOVOLOG) 100 UNIT/ML injection SLIDING SCALE 3 TIMES DAILY WITH MEALS: For blood sugar 70-120-no insulin. Blood sugar 121-150 take 5 units. Blood sugar 151-208 take 8 units. Blood sugar 201-250 take 12 units. Blood sugar 251-300 take 17 units. Blood sugar 301-350 take 20 units. Blood sugar 351-400 take 25 units. Blood sugar  greater than 400 take 28 units and call your doctor. 03/27/16   Devoria Albe, MD  insulin glargine (LANTUS) 100 UNIT/ML injection Inject 0.65 mLs (65 Units total) into the skin at bedtime. 08/13/17   Dhungel, Theda Belfast, MD  levothyroxine (SYNTHROID) 50 MCG tablet Take 1 tablet (50 mcg total) by mouth daily before breakfast. 01/03/18   Tilda Burrow, MD  metFORMIN (GLUCOPHAGE-XR) 500 MG 24 hr tablet Take 500 mg by mouth 2 (two) times daily.    [provider]  metoCLOPramide (REGLAN) 10 MG tablet Take 1 tablet (10 mg total) by mouth every 6 (six) hours as needed for nausea or vomiting. 02/04/18   Samuel Jester, DO  naproxen sodium (ALEVE) 220 MG tablet Take 440 mg by mouth daily as needed (pain).    [provider]    Family History Family History  Problem Relation Age of Onset  . Cancer Mother        leukemia  . Diabetes Mother   . Hypertension Mother   . Hyperlipidemia Mother   . Other Father        Sciatica, kidney stones  . Alcohol abuse Paternal Grandfather   . Other Paternal Grandmother        bowel obstruction; pneumonia  . Hypertension Maternal Grandmother   . Diabetes Maternal Grandmother   . Cancer Maternal Grandfather        lung  . ADD / ADHD Daughter   . Depression Daughter   . Anxiety disorder Daughter     Social History Social History   Tobacco Use  . Smoking status: Never Smoker  . Smokeless tobacco: Never Used  Substance Use Topics  . Alcohol use: No  . Drug use: No     Allergies   Paxil [paroxetine hcl]; Morphine and related; Latex; and Sulfa antibiotics   Review of Systems Review of Systems  Constitutional: Positive for chills and fever.  Gastrointestinal: Positive for vomiting.  Musculoskeletal: Positive for arthralgias.  Psychiatric/Behavioral: The patient is nervous/anxious.   All other systems reviewed and are negative.    Physical Exam Updated Vital Signs BP 116/90 (BP Location: Left Arm)   Pulse (!) 117   Temp 100.3  F (37.9 C) (Oral)   Resp (!) 22   Ht 1.803 m (5\' 11" )   Wt (!) 244.9 kg   SpO2 96%   BMI 75.31 kg/m   Physical Exam  CONSTITUTIONAL: Anxious HEAD: Normocephalic/atraumatic EYES: EOMI/PERRL ENMT: Mucous membranes moist NECK: supple no meningeal signs SPINE/BACK:entire spine nontender CV: S1/S2 noted LUNGS: Limited  due to body habitus, no apparent distress ABDOMEN: soft, nontender,obese GU:no cva tenderness NEURO: Pt is awake/alert/appropriate, moves  all extremitiesx4.  No facial droop.   EXTREMITIES: pulses normal/equal, full ROM Right lower extremity-distal pulses intact, no crepitus, no abscess, significant tenderness to the right posterior thigh, with some warmth noted.  She is able to flex/extend right knee SKIN: warm, color normal PSYCH: anxious  ED Treatments / Results  Labs (all labs ordered are listed, but only abnormal results are displayed) Labs Reviewed  BASIC METABOLIC PANEL - Abnormal; Notable for the following components:      Result Value   Sodium 133 (*)    Glucose, Bld 346 (*)    Calcium 8.7 (*)    All other components within normal limits  CBC WITH DIFFERENTIAL/PLATELET - Abnormal; Notable for the following components:   WBC 11.9 (*)    Neutro Abs 10.2 (*)    All other components within normal limits  URINALYSIS, ROUTINE W REFLEX MICROSCOPIC - Abnormal; Notable for the following components:   Glucose, UA >=500 (*)    Protein, ur 30 (*)    All other components within normal limits  I-STAT CG4 LACTIC ACID, ED - Abnormal; Notable for the following components:   Lactic Acid, Venous 3.00 (*)    All other components within normal limits  CULTURE, BLOOD (ROUTINE X 2)  CULTURE, BLOOD (ROUTINE X 2)  I-STAT BETA HCG BLOOD, ED ([facility], [facility], [facility] ONLY)    EKG None  Radiology No results found.  Procedures Procedures   CRITICAL CARE Performed by: Joya Gaskins Total critical care time: 35 minutes Critical care time was exclusive of separately billable  procedures and treating other patients. Critical care was necessary to treat or prevent imminent or life-threatening deterioration. Critical care was time spent personally by me on the following activities: development of treatment plan with patient and/or surrogate as well as nursing, discussions with consultants, evaluation of patient's response to treatment, examination of patient, obtaining history from patient or surrogate, ordering and performing treatments and interventions, ordering and review of laboratory studies, ordering and review of radiographic studies, pulse oximetry and re-evaluation of patient's condition. Patient with sepsis, tachycardia, requiring admission for IV antibiotics and monitoring Medications Ordered in ED Medications  vancomycin (VANCOCIN) 1,500 mg in sodium chloride 0.9 % 500 mL IVPB (has no administration in time range)  lactated ringers bolus 1,000 mL (has no administration in time range)    And  lactated ringers bolus 1,000 mL (has no administration in time range)    And  lactated ringers bolus 200 mL (has no administration in time range)  insulin aspart (novoLOG) injection 0-15 Units (has no administration in time range)  insulin aspart (novoLOG) injection 0-5 Units (has no administration in time range)  fentaNYL (SUBLIMAZE) injection 50 mcg (has no administration in time range)  fentaNYL (SUBLIMAZE) injection 100 mcg (100 mcg Intravenous Given 03/16/18 0558)  ondansetron (ZOFRAN) injection 4 mg (4 mg Intravenous Given 03/16/18 0558)  acetaminophen (TYLENOL) tablet 650 mg (650 mg Oral Given 03/16/18 0541)     Initial Impression / Assessment and Plan / ED Course  I have reviewed the triage vital signs and the nursing notes.  Pertinent labs  results that were available during my care of the patient were reviewed by me and considered in my medical decision making (see chart for details).     6:44 AM Patient presented with significant tenderness to right leg  that was abrupt in onset.  She reports previous history of cellulitis in left leg, and she has been admitted twice in the past 1.5 years She  is high risk due to morbid obesity, lymphedema.  She is also diabetic.  She was noted to be in sepsis, with lactate greater than 3.  Code sepsis was ordered, IV fluids as well as antibiotics. Patient will need to be admitted, and she is agreeable with this plan.  Discussed case with Dr. Antionette Char for admission.  Final Clinical Impressions(s) / ED Diagnoses   Final diagnoses:  Sepsis due to cellulitis [facility])  Cellulitis of right lower extremity  Hyperglycemia    ED Discharge Orders    None       Zadie Rhine, MD 03/16/18 [phone]

## 2018-03-16 NOTE — ED Notes (Signed)
CRITICAL VALUE ALERT  Critical Value:  Lactic 2.1  Date & Time Notified  0809 03/16/18   Provider Notified:vijata Blake Divine  Orders Received/Actions taken: -

## 2018-03-16 NOTE — ED Notes (Signed)
CRITICAL VALUE ALERT  Critical Value:  3.00 I-stat lactic  Date & Time Notied: 03/16/18 0610  Provider Notified: Dr. Bebe Shaggy

## 2018-03-17 LAB — CBC
HEMATOCRIT: 38.6 % (ref 36.0–46.0)
Hemoglobin: 12.2 g/dL (ref 12.0–15.0)
MCH: 28.2 pg (ref 26.0–34.0)
MCHC: 31.6 g/dL (ref 30.0–36.0)
MCV: 89.1 fL (ref 78.0–100.0)
PLATELETS: 150 10*3/uL (ref 150–400)
RBC: 4.33 MIL/uL (ref 3.87–5.11)
RDW: 14.2 % (ref 11.5–15.5)
WBC: 6 10*3/uL (ref 4.0–10.5)

## 2018-03-17 LAB — BASIC METABOLIC PANEL
ANION GAP: 9 (ref 5–15)
BUN: 7 mg/dL (ref 6–20)
CALCIUM: 8.2 mg/dL — AB (ref 8.9–10.3)
CO2: 20 mmol/L — AB (ref 22–32)
Chloride: 106 mmol/L (ref 98–111)
Creatinine, Ser: 0.65 mg/dL (ref 0.44–1.00)
Glucose, Bld: 212 mg/dL — ABNORMAL HIGH (ref 70–99)
Potassium: 3.6 mmol/L (ref 3.5–5.1)
SODIUM: 135 mmol/L (ref 135–145)

## 2018-03-17 LAB — LACTIC ACID, PLASMA: LACTIC ACID, VENOUS: 1.2 mmol/L (ref 0.5–1.9)

## 2018-03-17 LAB — GLUCOSE, CAPILLARY
GLUCOSE-CAPILLARY: 212 mg/dL — AB (ref 70–99)
Glucose-Capillary: 238 mg/dL — ABNORMAL HIGH (ref 70–99)
Glucose-Capillary: 219 mg/dL — ABNORMAL HIGH (ref 70–99)
Glucose-Capillary: 217 mg/dL — ABNORMAL HIGH (ref 70–99)

## 2018-03-17 MED ORDER — ONDANSETRON HCL 4 MG/2ML IJ SOLN
4.0000 mg | Freq: Four times a day (QID) | INTRAMUSCULAR | Status: DC
  Administered 2018-03-17 – 2018-03-19 (×9): 4 mg via INTRAVENOUS
  Filled 2018-03-17 (×9): qty 2

## 2018-03-17 MED ORDER — INSULIN ASPART 100 UNIT/ML ~~LOC~~ SOLN
2.0000 [IU] | Freq: Three times a day (TID) | SUBCUTANEOUS | Status: DC
  Administered 2018-03-17 – 2018-03-19 (×7): 2 [IU] via SUBCUTANEOUS

## 2018-03-17 MED ORDER — HYDROMORPHONE HCL 1 MG/ML IJ SOLN
1.0000 mg | INTRAMUSCULAR | Status: DC | PRN
  Administered 2018-03-17 – 2018-03-19 (×5): 1 mg via INTRAVENOUS
  Filled 2018-03-17 (×6): qty 1

## 2018-03-17 NOTE — Progress Notes (Signed)
PROGRESS NOTE    Brittany [name]  RUE:[id number] DOB: September 29, 1980 DOA: 03/16/2018 PCP: Gwenlyn Fudge, FNP  Brief Narrative: Brittany [name] is a 37 y.o. female with medical history significant of  Insulin dependent DM, recurrent cellulitis of the left leg, gastroparesis, lymphaedema, GERD, sleep apnea,depression, hypothyroidism, comes in to [facility] for persistent right leg pain and tenderness since last night associated with fever and chills. She was admitted for the management of cellulitis of the RLL.   Assessment & Plan:   Active Problems:   Hyponatremia   Sleep apnea   Diabetes mellitus without complication (HCC)   Morbid obesity with body mass index (BMI) greater than or equal to 70 in adult [facility])   Non-intractable vomiting with nausea   GERD (gastroesophageal reflux disease)   Hypothyroid   Sepsis due to cellulitis (HCC)   Sepsis ruled out   RLL cellulitis: Improving but not resolved, pain persistent, though erythema improved.  Continue with IV antibiotics for another 24 hours.  Elevate the leg.  Blood cultures negative.  Lactic acid normalized.  Start her on IV dilaudid for persistent pain.    Hyponatremia: improved.    Diabetes mellitus>  Uncontrolled with hyperglycemia.  CBG (last 3)  Recent Labs    03/16/18 1710 03/16/18 2152 03/17/18 0743  GLUCAP 274* 254* 212*   Resume ssi, will add 2 units of NOVOLOG tidac.    GERD stable.    Hypothyroidism:  Improving.     DVT prophylaxis: Lovenox.  Code Status: full code.  Family Communication: family member sleeping ,  Disposition Plan: pending clinical improvement.    Consultants:   None.   Procedures: Venous duplex negative for occlusive DVT of the RLL.    Antimicrobials: VANCOMYCIN SINCE ADMISSION.    Subjective: Reports pain is persistent, requesting IV pain meds as she vomited oral tramadol.   Objective: Vitals:   03/16/18 1500 03/16/18 1922 03/16/18 2150 03/17/18 0522  BP: 138/82   117/71 (!) 143/81  Pulse: 93  99 98  Resp: 18  20 20   Temp: 98.3 F (36.8 C)  99.4 F (37.4 C) 99.4 F (37.4 C)  TempSrc: Oral  Oral Oral  SpO2: 98% 94% 97% 96%  Weight:      Height:        Intake/Output Summary (Last 24 hours) at 03/17/2018 1025 Last data filed at 03/17/2018 0900 Gross per 24 hour  Intake 3035 ml  Output 3 ml  Net 3032 ml   Filed Weights   03/16/18 0504  Weight: (!) 244.9 kg    Examination:  General exam: Appears calm and comfortable  Respiratory system: Clear to auscultation. Respiratory effort normal. Cardiovascular system: S1 & S2 heard, RRR. No JVD, murmurs, rubs, gallops or clicks. No pedal edema. Gastrointestinal system: Abdomen is nondistended, soft and nontender. No organomegaly or masses felt. Normal bowel sounds heard. Central nervous system: Alert and oriented. No focal neurological deficits. Extremities: bilateral lymphedema, right lower extremity redness improved on the leg, its more in the upper thigh today.  Skin: No rashes, lesions or ulcers Psychiatry: Judgement and insight appear normal. Mood & affect appropriate.     Data Reviewed: I have personally reviewed following labs and imaging studies  CBC: Recent Labs  Lab 03/16/18 0602 03/17/18 0614  WBC 11.9* 6.0  NEUTROABS 10.2*  --   HGB 13.9 12.2  HCT 43.1 38.6  MCV 87.6 89.1  PLT 202 150   Basic Metabolic Panel: Recent Labs  Lab 03/16/18 0602 03/17/18 1478  NA 133* 135  K 3.7 3.6  CL 101 106  CO2 24 20*  GLUCOSE 346* 212*  BUN 10 7  CREATININE 0.87 0.65  CALCIUM 8.7* 8.2*   GFR: Estimated Creatinine Clearance: 209.3 mL/min (by C-G formula based on SCr of 0.65 mg/dL). Liver Function Tests: No results for input(s): AST, ALT, ALKPHOS, BILITOT, PROT, ALBUMIN in the last 168 hours. No results for input(s): LIPASE, AMYLASE in the last 168 hours. No results for input(s): AMMONIA in the last 168 hours. Coagulation Profile: No results for input(s): INR, PROTIME in the last  168 hours. Cardiac Enzymes: No results for input(s): CKTOTAL, CKMB, CKMBINDEX, TROPONINI in the last 168 hours. BNP (last 3 results) No results for input(s): PROBNP in the last 8760 hours. HbA1C: No results for input(s): HGBA1C in the last 72 hours. CBG: Recent Labs  Lab 03/16/18 1125 03/16/18 1710 03/16/18 2152 03/17/18 0743  GLUCAP 331* 274* 254* 212*   Lipid Profile: No results for input(s): CHOL, HDL, LDLCALC, TRIG, CHOLHDL, LDLDIRECT in the last 72 hours. Thyroid Function Tests: No results for input(s): TSH, T4TOTAL, FREET4, T3FREE, THYROIDAB in the last 72 hours. Anemia Panel: No results for input(s): VITAMINB12, FOLATE, FERRITIN, TIBC, IRON, RETICCTPCT in the last 72 hours. Sepsis Labs: Recent Labs  Lab 03/16/18 0610 03/16/18 0645 03/17/18 0904  LATICACIDVEN 3.00* 2.1* 1.2    Recent Results (from the past 240 hour(s))  Blood culture (routine x 2)     Status: None (Preliminary result)   Collection Time: 03/16/18  6:25 AM  Result Value Ref Range Status   Specimen Description BLOOD LEFT ARM  Final   Special Requests   Final    BOTTLES DRAWN AEROBIC AND ANAEROBIC Blood Culture adequate volume   Culture   Final    NO GROWTH < 24 HOURS Performed at [facility], [address], [place], [place] [address]    Report Status PENDING  Incomplete  Blood culture (routine x 2)     Status: None (Preliminary result)   Collection Time: 03/16/18  6:45 AM  Result Value Ref Range Status   Specimen Description BLOOD LEFT HAND  Final   Special Requests   Final    BOTTLES DRAWN AEROBIC ONLY Blood Culture results may not be optimal due to an inadequate volume of blood received in culture bottles   Culture   Final    NO GROWTH < 24 HOURS Performed at [facility], [address], [place], [place] [address]    Report Status PENDING  Incomplete         Radiology Studies: US Venous Img Lower Unilateral Right  Result Date: 03/16/2018 CLINICAL DATA:  Right calf tenderness, pain  and edema.  Cellulitis. EXAM: RIGHT LOWER EXTREMITY VENOUS DOPPLER ULTRASOUND TECHNIQUE: Gray-scale sonography with graded compression, as well as color Doppler and duplex ultrasound were performed to evaluate the lower extremity deep venous systems from the level of the common femoral vein and including the common femoral, femoral, profunda femoral, popliteal and calf veins including the posterior tibial, peroneal and gastrocnemius veins when visible. The superficial great saphenous vein was also interrogated. Spectral Doppler was utilized to evaluate flow at rest and with distal augmentation maneuvers in the common femoral, femoral and popliteal veins. COMPARISON:  None. FINDINGS: Contralateral Common Femoral Vein: Respiratory phasicity is normal and symmetric with the symptomatic side. No evidence of thrombus. Normal compressibility. Common Femoral Vein: No evidence of thrombus. Normal compressibility, respiratory phasicity and response to augmentation. Saphenofemoral Junction: No evidence of thrombus. Normal  compressibility and flow on color Doppler imaging. Profunda Femoral Vein: No evidence of thrombus. Normal compressibility and flow on color Doppler imaging. Femoral Vein: No evidence of thrombus. Normal compressibility, respiratory phasicity and response to augmentation. Popliteal Vein: No evidence of thrombus. Normal compressibility, respiratory phasicity and response to augmentation. Calf Veins: Limited assessment of the calf veins. Posterior tibial vein appears patent. Peroneal vein not visualized. Superficial Great Saphenous Vein: No evidence of thrombus. Normal compressibility. Venous Reflux:  None. Other Findings:  None. Limited exam because of obese body habitus. IMPRESSION: No significant occlusive DVT in the right lower extremity. Limited exam because of body habitus. Electronically Signed   By: Judie Petit.  Shick M.D.   On: 03/16/2018 11:48        Scheduled Meds: . enoxaparin (LOVENOX) injection   120 mg Subcutaneous Q24H  . famotidine  20 mg Oral BID  . FLUoxetine  20 mg Oral Daily  . insulin aspart  0-15 Units Subcutaneous TID WC  . insulin aspart  0-5 Units Subcutaneous QHS  . insulin glargine  65 Units Subcutaneous QHS  . levothyroxine  50 mcg Oral QAC breakfast  . ondansetron (ZOFRAN) IV  4 mg Intravenous Q6H   Continuous Infusions: . sodium chloride 75 mL/hr at 03/17/18 0405  . vancomycin Stopped (03/17/18 0744)     LOS: 1 day    Time spent: 35 minutes.     Kathlen Mody, MD [facility] Pager [phone]   If 7PM-7AM, please contact night-coverage [email or web address] Password [email or web address] 03/17/2018, 10:25 AM

## 2018-03-18 DIAGNOSIS — L03115 Cellulitis of right lower limb: Secondary | ICD-10-CM

## 2018-03-18 DIAGNOSIS — E038 Other specified hypothyroidism: Secondary | ICD-10-CM

## 2018-03-18 DIAGNOSIS — E119 Type 2 diabetes mellitus without complications: Secondary | ICD-10-CM

## 2018-03-18 DIAGNOSIS — Z6841 Body Mass Index (BMI) 40.0 and over, adult: Secondary | ICD-10-CM

## 2018-03-18 LAB — GLUCOSE, CAPILLARY
GLUCOSE-CAPILLARY: 165 mg/dL — AB (ref 70–99)
GLUCOSE-CAPILLARY: 171 mg/dL — AB (ref 70–99)
Glucose-Capillary: 145 mg/dL — ABNORMAL HIGH (ref 70–99)
Glucose-Capillary: 151 mg/dL — ABNORMAL HIGH (ref 70–99)

## 2018-03-18 LAB — VANCOMYCIN, TROUGH: Vancomycin Tr: 14 ug/mL — ABNORMAL LOW (ref 15–20)

## 2018-03-18 NOTE — Progress Notes (Signed)
Pharmacy Antibiotic Note  Brittany [name] is a 37 y.o. female admitted on 03/16/2018 with cellulitis.  Pharmacy has been consulted for vancomycin dosing. Lactic acid > 3 upon admission, patient has failed outpatient abx, seems to be severe cellulitis due to existing co morbidities.   Plan:Vancomycin trough 14, will continue current regimen and monitor renal fx  Vancomycin 1500mg   IV every 8 hours.  Goal trough 15-20 mcg/mL.  Height: 5\' 11"  (180.3 cm) Weight: (!) 540 lb (244.9 kg) IBW/kg (Calculated) : 70.8  Temp (24hrs), Avg:98.9 F (37.2 C), Min:98.8 F (37.1 C), Max:99 F (37.2 C)  Recent Labs  Lab 03/16/18 0602 03/16/18 0610 03/16/18 0645 03/17/18 0614 03/17/18 0904 03/18/18 0635  WBC 11.9*  --   --  6.0  --   --   CREATININE 0.87  --   --  0.65  --   --   LATICACIDVEN  --  3.00* 2.1*  --  1.2  --   VANCOTROUGH  --   --   --   --   --  14*    Estimated Creatinine Clearance: 209.3 mL/min (by C-G formula based on SCr of 0.65 mg/dL).    Allergies  Allergen Reactions  . Paxil [Paroxetine Hcl]     Suicidal thoughts  . Morphine And Related Other (See Comments)    "it just freaks me out"  . Latex Itching, Swelling and Rash  . Sulfa Antibiotics Nausea And Vomiting and Rash    Antimicrobials this admission: 9/6 vancomycin >>    Microbiology results: 9/6 BCx: ngtd   Thank you for allowing pharmacy to be a part of this patient's care.  Gerre Pebbles Jerol Rufener 03/18/2018 8:32 AM

## 2018-03-18 NOTE — Progress Notes (Signed)
PROGRESS NOTE    Brittany [name]  ZOX:[id number] DOB: 16-Feb-1981 DOA: 03/16/2018 PCP: Gwenlyn Fudge, FNP  Brief Narrative: Brittany [name] is a 37 y.o. female with medical history significant of  Insulin dependent DM, recurrent cellulitis of the left leg, gastroparesis, lymphaedema, GERD, sleep apnea,depression, hypothyroidism, comes in to [facility] for persistent right leg pain and tenderness since last night associated with fever and chills. She was admitted for the management of cellulitis of the RLE.   Assessment & Plan:   Active Problems:   Hyponatremia   Sleep apnea   Diabetes mellitus without complication (HCC)   Morbid obesity with body mass index (BMI) greater than or equal to 70 in adult [facility])   Non-intractable vomiting with nausea   GERD (gastroesophageal reflux disease)   Hypothyroid   Sepsis due to cellulitis (HCC)   Sepsis ruled out   RLL cellulitis: Appears to be slowly improving.  Continues to have some degree of erythema and pain.  We will continue IV antibiotics for today.  Reevaluate tomorrow.  Keep lower extremity elevated   Hyponatremia: improved.    Diabetes mellitus>  Uncontrolled with hyperglycemia.  CBG (last 3)  Recent Labs    03/18/18 0741 03/18/18 1129 03/18/18 1525  GLUCAP 165* 171* 145*   Continue on sliding scale insulin.  As well as meal coverage.  Blood sugar stable.   GERD stable.    Hypothyroidism:  Improving.     DVT prophylaxis: Lovenox.  Code Status: full code.  Family Communication: No family present  Disposition Plan: pending clinical improvement.    Consultants:   None.   Procedures: Venous duplex negative for occlusive DVT of the RLL.    Antimicrobials: VANCOMYCIN SINCE ADMISSION.    Subjective: Overall feel that she is improving.  Pain in lower extremity is getting better.  Objective: Vitals:   03/17/18 1359 03/17/18 2113 03/18/18 0543 03/18/18 1336  BP: 114/65 (!) 139/99 139/87 128/78  Pulse: 85 83  81 89  Resp: 20 19 18 20   Temp: 99 F (37.2 C) 98.8 F (37.1 C) 99 F (37.2 C) 98.2 F (36.8 C)  TempSrc: Oral Oral Oral Oral  SpO2: 94% 100% 100% 100%  Weight:      Height:        Intake/Output Summary (Last 24 hours) at 03/18/2018 1816 Last data filed at 03/18/2018 1700 Gross per 24 hour  Intake 720 ml  Output 2 ml  Net 718 ml   Filed Weights   03/16/18 0504  Weight: (!) 244.9 kg    Examination:  General exam: Alert, awake, oriented x 3 Respiratory system: Clear to auscultation. Respiratory effort normal. Cardiovascular system:RRR. No murmurs, rubs, gallops. Gastrointestinal system: Abdomen is nondistended, soft and nontender. No organomegaly or masses felt. Normal bowel sounds heard. Central nervous system: Alert and oriented. No focal neurological deficits. Extremities: No C/C/E, +pedal pulses Skin: Erythema and warmth noted on medial aspect of right thigh.  No fluid collections appreciated. Psychiatry: Judgement and insight appear normal. Mood & affect appropriate.     Data Reviewed: I have personally reviewed following labs and imaging studies  CBC: Recent Labs  Lab 03/16/18 0602 03/17/18 0614  WBC 11.9* 6.0  NEUTROABS 10.2*  --   HGB 13.9 12.2  HCT 43.1 38.6  MCV 87.6 89.1  PLT 202 150   Basic Metabolic Panel: Recent Labs  Lab 03/16/18 0602 03/17/18 0614  NA 133* 135  K 3.7 3.6  CL 101 106  CO2 24 20*  GLUCOSE 346* 212*  BUN 10 7  CREATININE 0.87 0.65  CALCIUM 8.7* 8.2*   GFR: Estimated Creatinine Clearance: 209.3 mL/min (by C-G formula based on SCr of 0.65 mg/dL). Liver Function Tests: No results for input(s): AST, ALT, ALKPHOS, BILITOT, PROT, ALBUMIN in the last 168 hours. No results for input(s): LIPASE, AMYLASE in the last 168 hours. No results for input(s): AMMONIA in the last 168 hours. Coagulation Profile: No results for input(s): INR, PROTIME in the last 168 hours. Cardiac Enzymes: No results for input(s): CKTOTAL, CKMB, CKMBINDEX,  TROPONINI in the last 168 hours. BNP (last 3 results) No results for input(s): PROBNP in the last 8760 hours. HbA1C: No results for input(s): HGBA1C in the last 72 hours. CBG: Recent Labs  Lab 03/17/18 1619 03/17/18 2137 03/18/18 0741 03/18/18 1129 03/18/18 1525  GLUCAP 219* 217* 165* 171* 145*   Lipid Profile: No results for input(s): CHOL, HDL, LDLCALC, TRIG, CHOLHDL, LDLDIRECT in the last 72 hours. Thyroid Function Tests: No results for input(s): TSH, T4TOTAL, FREET4, T3FREE, THYROIDAB in the last 72 hours. Anemia Panel: No results for input(s): VITAMINB12, FOLATE, FERRITIN, TIBC, IRON, RETICCTPCT in the last 72 hours. Sepsis Labs: Recent Labs  Lab 03/16/18 0610 03/16/18 0645 03/17/18 0904  LATICACIDVEN 3.00* 2.1* 1.2    Recent Results (from the past 240 hour(s))  Blood culture (routine x 2)     Status: None (Preliminary result)   Collection Time: 03/16/18  6:25 AM  Result Value Ref Range Status   Specimen Description BLOOD LEFT ARM  Final   Special Requests   Final    BOTTLES DRAWN AEROBIC AND ANAEROBIC Blood Culture adequate volume   Culture   Final    NO GROWTH 2 DAYS Performed at [facility], [address], [place], [place] [address]    Report Status PENDING  Incomplete  Blood culture (routine x 2)     Status: None (Preliminary result)   Collection Time: 03/16/18  6:45 AM  Result Value Ref Range Status   Specimen Description BLOOD LEFT HAND  Final   Special Requests   Final    BOTTLES DRAWN AEROBIC ONLY Blood Culture results may not be optimal due to an inadequate volume of blood received in culture bottles   Culture   Final    NO GROWTH 2 DAYS Performed at [facility], [address], [place], [place] [address]    Report Status PENDING  Incomplete         Radiology Studies: No results found.      Scheduled Meds: . enoxaparin (LOVENOX) injection  120 mg Subcutaneous Q24H  . famotidine  20 mg Oral BID  . FLUoxetine  20 mg Oral Daily  .  insulin aspart  0-15 Units Subcutaneous TID WC  . insulin aspart  0-5 Units Subcutaneous QHS  . insulin aspart  2 Units Subcutaneous TID WC  . insulin glargine  65 Units Subcutaneous QHS  . levothyroxine  50 mcg Oral QAC breakfast  . ondansetron (ZOFRAN) IV  4 mg Intravenous Q6H   Continuous Infusions: . vancomycin Stopped (03/18/18 1719)     LOS: 2 days    Time spent: 35 minutes.     Erick Blinks, MD [facility] Pager [phone]  If 7PM-7AM, please contact night-coverage [email or web address] Password [facility] 03/18/2018, 6:16 PM

## 2018-03-19 DIAGNOSIS — K219 Gastro-esophageal reflux disease without esophagitis: Secondary | ICD-10-CM

## 2018-03-19 LAB — GLUCOSE, CAPILLARY
GLUCOSE-CAPILLARY: 113 mg/dL — AB (ref 70–99)
Glucose-Capillary: 141 mg/dL — ABNORMAL HIGH (ref 70–99)
Glucose-Capillary: 113 mg/dL — ABNORMAL HIGH (ref 70–99)

## 2018-03-19 LAB — CREATININE, SERUM
Creatinine, Ser: 0.8 mg/dL (ref 0.44–1.00)
GFR calc Af Amer: >60 mL/min (ref >60)
GFR calc non Af Amer: >60 mL/min (ref >60)

## 2018-03-19 MED ORDER — DOXYCYCLINE HYCLATE 100 MG PO CAPS: 100.0000 mg | ORAL_CAPSULE | Freq: Two times a day (BID) | ORAL | 0 refills | Status: DC

## 2018-03-19 NOTE — Progress Notes (Signed)
Pt discharged home today per Dr. Memon. Pt's IV site D/C'd and WDL. Pt's VSS. Pt provided with home medication list, discharge instructions and prescriptions. Verbalized understanding. Pt left floor via WC in stable condition accompanied by NT.  

## 2018-03-19 NOTE — Discharge Summary (Addendum)
Physician Discharge Summary  Brittany [name]:[id number] DOB: [birth year] DOA: 03/16/2018  PCP: Gwenlyn Fudge, FNP  Admit date: 03/16/2018 Discharge date: 03/19/2018  Admitted From: Home Disposition: Home  Recommendations for Outpatient Follow-up:  1. Follow up with PCP in 1-2 weeks 2. Please obtain BMP/CBC in one week  Home Health: Equipment/Devices:  Discharge Condition: Stable CODE STATUS: Full code Diet recommendation: Heart healthy, carb modified  Brief/Interim Summary: 37 year old female with morbid obesity, insulin-dependent diabetes, admitted to the hospital with right lower extremity cellulitis and associated sepsis.  Sje was febrile, tachycardic, tachypneic and had elevated lactic acid. Patient was found to have significant erythema in the medial aspect of her right thigh.  She was treated with intravenous vancomycin and her overall erythema has significantly improved.  Fevers/sepsis have resolved.  She continues to have some soreness in her thigh, but erythema is markedly better.  She did not have any significant leukocytosis.  She has been transitioned to oral doxycycline.  She has follow-up with her primary care physician scheduled for tomorrow.  Patient appears stable for discharge home today.  Discharge Diagnoses:  Active Problems:   Hyponatremia   Sleep apnea   Diabetes mellitus without complication (HCC)   Morbid obesity with body mass index (BMI) greater than or equal to 70 in adult [facility])   Non-intractable vomiting with nausea   GERD (gastroesophageal reflux disease)   Hypothyroid   Sepsis due to cellulitis [facility])    Discharge Instructions  Discharge Instructions    Diet - low sodium heart healthy   Complete by:  As directed    Increase activity slowly   Complete by:  As directed      Allergies as of 03/19/2018      Reactions   Paxil [paroxetine Hcl]    Suicidal thoughts   Morphine And Related Other (See Comments)   "it just freaks me out"   Latex  Itching, Swelling, Rash   Sulfa Antibiotics Nausea And Vomiting, Rash      Medication List    TAKE these medications   doxycycline 100 MG capsule Commonly known as:  VIBRAMYCIN Take 1 capsule (100 mg total) by mouth 2 (two) times daily.   famotidine 20 MG tablet Commonly known as:  PEPCID Take 1 tablet (20 mg total) by mouth 2 (two) times daily.   FLUoxetine 20 MG capsule Commonly known as:  PROZAC Take 20 mg by mouth daily.   insulin aspart 100 UNIT/ML injection Commonly known as:  novoLOG SLIDING SCALE 3 TIMES DAILY WITH MEALS: For blood sugar 70-120-no insulin. Blood sugar 121-150 take 5 units. Blood sugar 151-208 take 8 units. Blood sugar 201-250 take 12 units. Blood sugar 251-300 take 17 units. Blood sugar 301-350 take 20 units. Blood sugar 351-400 take 25 units. Blood sugar greater than 400 take 28 units and call your doctor.   insulin glargine 100 UNIT/ML injection Commonly known as:  LANTUS Inject 0.65 mLs (65 Units total) into the skin at bedtime.   levothyroxine 50 MCG tablet Commonly known as:  SYNTHROID, LEVOTHROID Take 1 tablet (50 mcg total) by mouth daily before breakfast.   metFORMIN 500 MG 24 hr tablet Commonly known as:  GLUCOPHAGE-XR Take 500 mg by mouth 2 (two) times daily.   metoCLOPramide 10 MG tablet Commonly known as:  REGLAN Take 1 tablet (10 mg total) by mouth every 6 (six) hours as needed for nausea or vomiting.   TYLENOL 500 MG tablet Generic drug:  acetaminophen Take 1,000 mg by mouth every  6 (six) hours as needed for mild pain.       Allergies  Allergen Reactions  . Paxil [Paroxetine Hcl]     Suicidal thoughts  . Morphine And Related Other (See Comments)    "it just freaks me out"  . Latex Itching, Swelling and Rash  . Sulfa Antibiotics Nausea And Vomiting and Rash    Consultations:     Procedures/Studies: US Venous Img Lower Unilateral Right  Result Date: 03/16/2018 CLINICAL DATA:  Right calf tenderness, pain and edema.   Cellulitis. EXAM: RIGHT LOWER EXTREMITY VENOUS DOPPLER ULTRASOUND TECHNIQUE: Gray-scale sonography with graded compression, as well as color Doppler and duplex ultrasound were performed to evaluate the lower extremity deep venous systems from the level of the common femoral vein and including the common femoral, femoral, profunda femoral, popliteal and calf veins including the posterior tibial, peroneal and gastrocnemius veins when visible. The superficial great saphenous vein was also interrogated. Spectral Doppler was utilized to evaluate flow at rest and with distal augmentation maneuvers in the common femoral, femoral and popliteal veins. COMPARISON:  None. FINDINGS: Contralateral Common Femoral Vein: Respiratory phasicity is normal and symmetric with the symptomatic side. No evidence of thrombus. Normal compressibility. Common Femoral Vein: No evidence of thrombus. Normal compressibility, respiratory phasicity and response to augmentation. Saphenofemoral Junction: No evidence of thrombus. Normal compressibility and flow on color Doppler imaging. Profunda Femoral Vein: No evidence of thrombus. Normal compressibility and flow on color Doppler imaging. Femoral Vein: No evidence of thrombus. Normal compressibility, respiratory phasicity and response to augmentation. Popliteal Vein: No evidence of thrombus. Normal compressibility, respiratory phasicity and response to augmentation. Calf Veins: Limited assessment of the calf veins. Posterior tibial vein appears patent. Peroneal vein not visualized. Superficial Great Saphenous Vein: No evidence of thrombus. Normal compressibility. Venous Reflux:  None. Other Findings:  None. Limited exam because of obese body habitus. IMPRESSION: No significant occlusive DVT in the right lower extremity. Limited exam because of body habitus. Electronically Signed   By: Judie Petit.  Shick M.D.   On: 03/16/2018 11:48       Subjective: Feels that the right thigh is still sore but agrees  that erythema is significantly improved  Discharge Exam: Vitals:   03/18/18 1336 03/18/18 2115 03/19/18 0551 03/19/18 1457  BP: 128/78 (!) 152/102 (!) 139/100 132/80  Pulse: 89 80 93 94  Resp: 20 18 18 16   Temp: 98.2 F (36.8 C) 98 F (36.7 C) 98.1 F (36.7 C) 98.1 F (36.7 C)  TempSrc: Oral Oral  Oral  SpO2: 100% 98% 97% 97%  Weight:      Height:        General: Pt is alert, awake, not in acute distress Cardiovascular: RRR, S1/S2 +, no rubs, no gallops Respiratory: CTA bilaterally, no wheezing, no rhonchi Abdominal: Soft, NT, ND, bowel sounds + Extremities: Erythema on medial aspect of right thigh is improving.    The results of significant diagnostics from this hospitalization (including imaging, microbiology, ancillary and laboratory) are listed below for reference.     Microbiology: Recent Results (from the past 240 hour(s))  Blood culture (routine x 2)     Status: None (Preliminary result)   Collection Time: 03/16/18  6:25 AM  Result Value Ref Range Status   Specimen Description BLOOD LEFT ARM  Final   Special Requests   Final    BOTTLES DRAWN AEROBIC AND ANAEROBIC Blood Culture adequate volume   Culture   Final    NO GROWTH 3 DAYS Performed at [facility]  [facility], [address] [place], [place] [address]    Report Status PENDING  Incomplete  Blood culture (routine x 2)     Status: None (Preliminary result)   Collection Time: 03/16/18  6:45 AM  Result Value Ref Range Status   Specimen Description BLOOD LEFT HAND  Final   Special Requests   Final    BOTTLES DRAWN AEROBIC ONLY Blood Culture results may not be optimal due to an inadequate volume of blood received in culture bottles   Culture   Final    NO GROWTH 3 DAYS Performed at [facility], [address] [place], [place] [address]    Report Status PENDING  Incomplete     Labs: BNP (last 3 results) No results for input(s): BNP in the last 8760 hours. Basic Metabolic Panel: Recent Labs  Lab 03/16/18 0602  03/17/18 0614 03/19/18 0454  NA 133* 135  --   K 3.7 3.6  --   CL 101 106  --   CO2 24 20*  --   GLUCOSE 346* 212*  --   BUN 10 7  --   CREATININE 0.87 0.65 0.80  CALCIUM 8.7* 8.2*  --    Liver Function Tests: No results for input(s): AST, ALT, ALKPHOS, BILITOT, PROT, ALBUMIN in the last 168 hours. No results for input(s): LIPASE, AMYLASE in the last 168 hours. No results for input(s): AMMONIA in the last 168 hours. CBC: Recent Labs  Lab 03/16/18 0602 03/17/18 0614  WBC 11.9* 6.0  NEUTROABS 10.2*  --   HGB 13.9 12.2  HCT 43.1 38.6  MCV 87.6 89.1  PLT 202 150   Cardiac Enzymes: No results for input(s): CKTOTAL, CKMB, CKMBINDEX, TROPONINI in the last 168 hours. BNP: Invalid input(s): POCBNP CBG: Recent Labs  Lab 03/18/18 1525 03/18/18 2119 03/19/18 0805 03/19/18 1141 03/19/18 1653  GLUCAP 145* 151* 113* 141* 113*   D-Dimer No results for input(s): DDIMER in the last 72 hours. Hgb A1c No results for input(s): HGBA1C in the last 72 hours. Lipid Profile No results for input(s): CHOL, HDL, LDLCALC, TRIG, CHOLHDL, LDLDIRECT in the last 72 hours. Thyroid function studies No results for input(s): TSH, T4TOTAL, T3FREE, THYROIDAB in the last 72 hours.  Invalid input(s): FREET3 Anemia work up No results for input(s): VITAMINB12, FOLATE, FERRITIN, TIBC, IRON, RETICCTPCT in the last 72 hours. Urinalysis    Component Value Date/Time   COLORURINE YELLOW 03/16/2018 0615   APPEARANCEUR CLEAR 03/16/2018 0615   LABSPEC 1.028 03/16/2018 0615   PHURINE 5.0 03/16/2018 0615   GLUCOSEU >=500 (A) 03/16/2018 0615   HGBUR NEGATIVE 03/16/2018 0615   BILIRUBINUR NEGATIVE 03/16/2018 0615   KETONESUR NEGATIVE 03/16/2018 0615   PROTEINUR 30 (A) 03/16/2018 0615   UROBILINOGEN 0.2 02/22/2013 0414   NITRITE NEGATIVE 03/16/2018 0615   LEUKOCYTESUR NEGATIVE 03/16/2018 0615   Sepsis Labs Invalid input(s): PROCALCITONIN,  WBC,  LACTICIDVEN Microbiology Recent Results (from the past  240 hour(s))  Blood culture (routine x 2)     Status: None (Preliminary result)   Collection Time: 03/16/18  6:25 AM  Result Value Ref Range Status   Specimen Description BLOOD LEFT ARM  Final   Special Requests   Final    BOTTLES DRAWN AEROBIC AND ANAEROBIC Blood Culture adequate volume   Culture   Final    NO GROWTH 3 DAYS Performed at [facility], [address], [place], [place] [address]    Report Status PENDING  Incomplete  Blood culture (routine x 2)  Status: None (Preliminary result)   Collection Time: 03/16/18  6:45 AM  Result Value Ref Range Status   Specimen Description BLOOD LEFT HAND  Final   Special Requests   Final    BOTTLES DRAWN AEROBIC ONLY Blood Culture results may not be optimal due to an inadequate volume of blood received in culture bottles   Culture   Final    NO GROWTH 3 DAYS Performed at [facility], [address] [place], [place] [address]    Report Status PENDING  Incomplete     Time coordinating discharge:  SIGNED:   Erick Blinks, MD  [facility] 03/19/2018, 7:04 PM Pager   If 7PM-7AM, please contact night-coverage [email or web address] Password [email or web address]

## 2018-03-21 LAB — CULTURE, BLOOD (ROUTINE X 2)
CULTURE: NO GROWTH
Culture: NO GROWTH
SPECIAL REQUESTS: ADEQUATE

## 2018-03-26 ENCOUNTER — Telehealth (INDEPENDENT_AMBULATORY_CARE_PROVIDER_SITE_OTHER): Payer: Self-pay | Admitting: Internal Medicine

## 2018-03-26 NOTE — Telephone Encounter (Signed)
Please call Britney Joyce-NP with [facility] about this patient - ph# [phone]

## 2018-03-26 NOTE — Telephone Encounter (Signed)
This is an [facility] patient

## 2018-04-05 ENCOUNTER — Ambulatory Visit (INDEPENDENT_AMBULATORY_CARE_PROVIDER_SITE_OTHER): Payer: Medicaid Other | Admitting: Internal Medicine

## 2018-04-12 ENCOUNTER — Encounter (INDEPENDENT_AMBULATORY_CARE_PROVIDER_SITE_OTHER): Payer: Self-pay | Admitting: Internal Medicine

## 2018-04-12 ENCOUNTER — Ambulatory Visit (INDEPENDENT_AMBULATORY_CARE_PROVIDER_SITE_OTHER): Payer: Medicaid Other | Admitting: Internal Medicine

## 2018-04-12 VITALS — BP 120/72 | HR 88 | Temp 98.2°F | Ht 71.0 in | Wt >= 6400 oz

## 2018-04-12 DIAGNOSIS — R112 Nausea with vomiting, unspecified: Secondary | ICD-10-CM | POA: Diagnosis not present

## 2018-04-12 NOTE — Progress Notes (Signed)
Subjective:    Patient ID: Brittany [name], female    DOB: Mar 28, 1981, 37 y.o.   MRN: [id number]  HPI Here today for f/u. Last seen in July of this year. Hx of non intractable vomiting. I scheduled her for an Emptying Study but she began to vomit during the procedure and was aborted.  Probably hx of gastroparesis.  Her nausea and vomiting is not better. She is taking Zofran for her nausea which is helping.  She says half the time she keeps her food down and sometimes she doesn't. She has lost from 539 to 521.5. She says the weight loss has been intentional.  She has a BM x 1 day. 07/13/2017 CT  Abdomen/pelvis with CM: abdominal pain, emesis, and fever:  1. No acute process identified. 2. Interval increase in left external iliac and pelvic sidewall lymph node size as well as splenomegaly. Evaluation for systemic infectious, inflammatory, or lymphoproliferative process is recommended. Hx of diabetes x 3 yrs which is not controlled.   Cellulitis to left lower extremity. Discharged from [facility] 03/19/2018 with rt lower extremity cellulitis, sepsis, fever.  Hx of severe lymphedema.   CBC    Component Value Date/Time   WBC 6.0 03/17/2018 0614   RBC 4.33 03/17/2018 0614   HGB 12.2 03/17/2018 0614   HCT 38.6 03/17/2018 0614   PLT 150 03/17/2018 0614   MCV 89.1 03/17/2018 0614   MCH 28.2 03/17/2018 0614   MCHC 31.6 03/17/2018 0614   RDW 14.2 03/17/2018 0614   LYMPHSABS 1.1 03/16/2018 0602   MONOABS 0.4 03/16/2018 0602   EOSABS 0.1 03/16/2018 0602   BASOSABS 0.0 03/16/2018 0602   Hepatic Function Latest Ref Rng & Units 02/04/2018 11/12/2017 08/22/2017  Total Protein 6.5 - 8.1 g/dL 7.5 7.2 8.0  Albumin 3.5 - 5.0 g/dL 3.4(L) 3.2(L) 3.6  AST 15 - 41 U/L '17 16 19  ' ALT 0 - 44 U/L '21 19 20  ' Alk Phosphatase 38 - 126 U/L 81 86 79  Total Bilirubin 0.3 - 1.2 mg/dL 0.5 0.8 0.9     Review of Systems     Past Medical History:  Diagnosis Date  . Anxiety   . Carpal tunnel syndrome on left  10/12/2016  . Cellulitis and abscess of leg   . Chronic abdominal pain   . Depression   . Diabetes mellitus without complication ([place])   . Gallstones   . GERD (gastroesophageal reflux disease) 08/13/2017  . Headache   . Hypothyroid 01/03/2018  . Lymphedema   . Sleep apnea    DX with sleep apnea - unable to use c-pap  . Umbilical hernia     Past Surgical History:  Procedure Laterality Date  . CESAREAN SECTION  11/09/01  . HERNIA REPAIR    . INSERTION OF MESH N/A 01/02/2013   Procedure: INSERTION OF MESH;  Surgeon: Madilyn Hook, DO;  Location: [facility];  Service: General;  Laterality: N/A;  . UMBILICAL HERNIA REPAIR N/A 01/02/2013   Procedure: LAPAROSCOPIC UMBILICAL HERNIA;  Surgeon: Madilyn Hook, DO;  Location: [facility];  Service: General;  Laterality: N/A;    Allergies  Allergen Reactions  . Paxil [Paroxetine Hcl]     Suicidal thoughts  . Morphine And Related Other (See Comments)    "it just freaks me out"  . Latex Itching, Swelling and Rash  . Sulfa Antibiotics Nausea And Vomiting and Rash    Current Outpatient Medications on File Prior to Visit  Medication Sig Dispense Refill  . acetaminophen (TYLENOL)  500 MG tablet Take 1,000 mg by mouth every 6 (six) hours as needed for mild pain.    Marland Kitchen doxycycline (VIBRAMYCIN) 100 MG capsule Take 1 capsule (100 mg total) by mouth 2 (two) times daily. 10 capsule 0  . famotidine (PEPCID) 20 MG tablet Take 1 tablet (20 mg total) by mouth 2 (two) times daily. 30 tablet 0  . FLUoxetine (PROZAC) 20 MG capsule Take 20 mg by mouth daily.    . insulin aspart (NOVOLOG) 100 UNIT/ML injection SLIDING SCALE 3 TIMES DAILY WITH MEALS: For blood sugar 70-120-no insulin. Blood sugar 121-150 take 5 units. Blood sugar 151-208 take 8 units. Blood sugar 201-250 take 12 units. Blood sugar 251-300 take 17 units. Blood sugar 301-350 take 20 units. Blood sugar 351-400 take 25 units. Blood sugar greater than 400 take 28 units and call your doctor. 10 mL 0  . insulin glargine  (LANTUS) 100 UNIT/ML injection Inject 0.65 mLs (65 Units total) into the skin at bedtime. 10 mL 11  . levothyroxine (SYNTHROID) 50 MCG tablet Take 1 tablet (50 mcg total) by mouth daily before breakfast. 30 tablet 3  . metFORMIN (GLUCOPHAGE-XR) 500 MG 24 hr tablet Take 500 mg by mouth 2 (two) times daily.    . metoCLOPramide (REGLAN) 10 MG tablet Take 1 tablet (10 mg total) by mouth every 6 (six) hours as needed for nausea or vomiting. 12 tablet 0   No current facility-administered medications on file prior to visit.      Objective:   Physical Exam Blood pressure 120/72, pulse 88, temperature 98.2 F (36.8 C), height '5\' 11"'  (1.803 m), weight (!) 521 lb 8 oz (236.6 kg). Alert and oriented. Skin warm and dry. Oral mucosa is moist.   . Sclera anicteric, conjunctivae is pink. Thyroid not enlarged. No cervical lymphadenopathy. Lungs clear. Heart regular rate and rhythm.  Abdomen is soft. Bowel sounds are positive. No hepatomegaly. No abdominal masses felt. No tenderness.  3+ edema to lower extremities.  Morbidly obese.  Lymphedema to both lower extremities.            Assessment & Plan:  Intractable nausea and vomiting. Will see back in 6 months. Will get a CBC and CMET today. Advised her she really needed to see and Endocrinologists concerning her diabetes. I feel she would benefit being evaluated at [facility] for her gross obesity and nausea and vomiting. Will hold off for now.  She is a very sweet lady with multiple medical problems.

## 2018-04-12 NOTE — Patient Instructions (Signed)
Labs today. OV in 6 months.  

## 2018-04-13 LAB — CBC WITH DIFFERENTIAL/PLATELET
BASOS ABS: 42 {cells}/uL (ref 0–200)
BASOS PCT: 0.6 %
Eosinophils Absolute: 91 cells/uL (ref 15–500)
Eosinophils Relative: 1.3 %
HEMATOCRIT: 44.1 % (ref 35.0–45.0)
HEMOGLOBIN: 14.5 g/dL (ref 11.7–15.5)
Lymphs Abs: 1925 cells/uL (ref 850–3900)
MCH: 27.7 pg (ref 27.0–33.0)
MCHC: 32.9 g/dL (ref 32.0–36.0)
MCV: 84.3 fL (ref 80.0–100.0)
MPV: 12.1 fL (ref 7.5–12.5)
Monocytes Relative: 4.2 %
NEUTROS ABS: 4648 {cells}/uL (ref 1500–7800)
Neutrophils Relative %: 66.4 %
PLATELETS: 275 10*3/uL (ref 140–400)
RBC: 5.23 10*6/uL — ABNORMAL HIGH (ref 3.80–5.10)
RDW: 13.3 % (ref 11.0–15.0)
Total Lymphocyte: 27.5 %
WBC: 7 10*3/uL (ref 3.8–10.8)
WBCMIX: 294 {cells}/uL (ref 200–950)

## 2018-04-13 LAB — COMPREHENSIVE METABOLIC PANEL
AG Ratio: 1.1 (calc) (ref 1.0–2.5)
ALBUMIN MSPROF: 4 g/dL (ref 3.6–5.1)
ALKALINE PHOSPHATASE (APISO): 99 U/L (ref 33–115)
ALT: 22 U/L (ref 6–29)
AST: 22 U/L (ref 10–30)
BUN: 13 mg/dL (ref 7–25)
CHLORIDE: 101 mmol/L (ref 98–110)
CO2: 21 mmol/L (ref 20–32)
CREATININE: 0.94 mg/dL (ref 0.50–1.10)
Calcium: 9.5 mg/dL (ref 8.6–10.2)
GLOBULIN: 3.8 g/dL — AB (ref 1.9–3.7)
GLUCOSE: 219 mg/dL — AB (ref 65–139)
POTASSIUM: 4.7 mmol/L (ref 3.5–5.3)
SODIUM: 138 mmol/L (ref 135–146)
Total Bilirubin: 0.6 mg/dL (ref 0.2–1.2)
Total Protein: 7.8 g/dL (ref 6.1–8.1)

## 2018-04-13 LAB — SPECIMEN COMPROMISED

## 2018-09-27 ENCOUNTER — Encounter (INDEPENDENT_AMBULATORY_CARE_PROVIDER_SITE_OTHER): Payer: Self-pay

## 2018-10-09 ENCOUNTER — Other Ambulatory Visit: Payer: Self-pay

## 2018-10-09 ENCOUNTER — Ambulatory Visit (INDEPENDENT_AMBULATORY_CARE_PROVIDER_SITE_OTHER): Payer: Medicaid Other | Admitting: Internal Medicine

## 2018-10-09 ENCOUNTER — Encounter (INDEPENDENT_AMBULATORY_CARE_PROVIDER_SITE_OTHER): Payer: Self-pay | Admitting: Internal Medicine

## 2018-10-09 DIAGNOSIS — R112 Nausea with vomiting, unspecified: Secondary | ICD-10-CM

## 2018-10-09 NOTE — Progress Notes (Addendum)
Subjective:  Patient agrees to speak to me.   Patient ID: Brittany [name], female    DOB: 1981/05/18, 38 y.o.   MRN: [id number] PCP Deliah Boston FNP. Total time with patient 20 minutes on the phone. Unable to due video OV. Telephone OV due to COVID-19.  Has not been out of state or the country. She agrees to this office visit. She is at home.  Hx of non-intractable vomiting. She does occasionally vomit . She says she "absolutely no pain" to her epigastric region. Hx of diabetes x 3 yrs which is not controlled. She says she has a referral to an endocrinologist for her care. She could not give me the name of the physician.  Her last weight in office 04/12/2018 was 521. She says she has lost 5 pounds. She is getting a new w/c next week.  She says she cannot do long distance walking.  Her blood sugars run around the 200s.   She says she does not have cellulitis in her left leg at this time Her appetite is good. She has changed her diet. She is eating more meats and chicken. She has cut back on sodas and is drinking more water.  Her periods are irregular. Her PCP is aware.  States she has stopped her Metformin. The Metformin did not make her feel good.   HPI    Review of Systems Past Medical History:  Diagnosis Date  . Anxiety   . Carpal tunnel syndrome on left 10/12/2016  . Cellulitis and abscess of leg   . Chronic abdominal pain   . Depression   . Diabetes mellitus without complication (HCC)   . Gallstones   . GERD (gastroesophageal reflux disease) 08/13/2017  . Headache   . Hypothyroid 01/03/2018  . Lymphedema   . Sleep apnea    DX with sleep apnea - unable to use c-pap  . Umbilical hernia     Past Surgical History:  Procedure Laterality Date  . CESAREAN SECTION  11/09/01  . HERNIA REPAIR    . INSERTION OF MESH N/A 01/02/2013   Procedure: INSERTION OF MESH;  Surgeon: Lodema Pilot, DO;  Location: [facility];  Service: General;  Laterality: N/A;  . UMBILICAL HERNIA REPAIR N/A 01/02/2013    Procedure: LAPAROSCOPIC UMBILICAL HERNIA;  Surgeon: Lodema Pilot, DO;  Location: [facility];  Service: General;  Laterality: N/A;    Allergies  Allergen Reactions  . Paxil [Paroxetine Hcl]     Suicidal thoughts  . Morphine And Related Other (See Comments)    "it just freaks me out"  . Latex Itching, Swelling and Rash  . Sulfa Antibiotics Nausea And Vomiting and Rash    Current Outpatient Medications on File Prior to Visit  Medication Sig Dispense Refill  . acetaminophen (TYLENOL) 500 MG tablet Take 1,000 mg by mouth every 6 (six) hours as needed for mild pain.    . famotidine (PEPCID) 20 MG tablet Take 1 tablet (20 mg total) by mouth 2 (two) times daily. 30 tablet 0  . insulin aspart (NOVOLOG) 100 UNIT/ML injection SLIDING SCALE 3 TIMES DAILY WITH MEALS: For blood sugar 70-120-no insulin. Blood sugar 121-150 take 5 units. Blood sugar 151-208 take 8 units. Blood sugar 201-250 take 12 units. Blood sugar 251-300 take 17 units. Blood sugar 301-350 take 20 units. Blood sugar 351-400 take 25 units. Blood sugar greater than 400 take 28 units and call your doctor. 10 mL 0  . insulin glargine (LANTUS) 100 UNIT/ML injection Inject 0.65 mLs (  65 Units total) into the skin at bedtime. 10 mL 11  . levothyroxine (SYNTHROID) 50 MCG tablet Take 1 tablet (50 mcg total) by mouth daily before breakfast. 30 tablet 3   No current facility-administered medications on file prior to visit.         Objective:   Physical Exam  Deferred. (Telephone OV).      Assessment & Plan:  Non intractable nausea and vomiting. She sounded really good today. Her symptoms are much better. Will get a CBC and CMET on her.  I spent a total of 20 minutes talking with patient.

## 2018-10-09 NOTE — Patient Instructions (Signed)
Will get CBC and CMET. OV in 6 months.

## 2018-10-12 ENCOUNTER — Ambulatory Visit (INDEPENDENT_AMBULATORY_CARE_PROVIDER_SITE_OTHER): Payer: Medicaid Other | Admitting: Internal Medicine

## 2019-02-16 IMAGING — NM NM GASTRIC EMPTYING
4 series · 4 of 4 positions shown · non-contrast
Comparison: None.

CLINICAL DATA: Nausea, vomiting

EXAM:
NUCLEAR MEDICINE GASTRIC EMPTYING SCAN
TECHNIQUE: After oral ingestion of radiolabeled meal, sequential abdominal
images were obtained for 120 minutes. Residual percentage of
activity remaining within the stomach was calculated at 60 and 120
minutes.
RADIOPHARMACEUTICALS:  2 mCi Oc-LLm sulfur colloid in standardized
meal

[Series 1: 0 min · 4.14mm/px · 1 of 1 slices shown (1 of 2)]
[im 1/1]
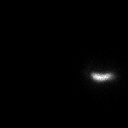

[Series 1: 0 min · 4.14mm/px · 1 of 1 slices shown (2 of 2)]
[im 1/1]
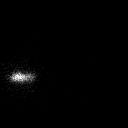

[Series 2: 60 min · 4.14mm/px · 1 of 1 slices shown (1 of 2)]
[im 1/1]
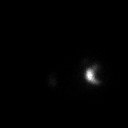

[Series 2: 60 min · 4.14mm/px · 1 of 1 slices shown (2 of 2)]
[im 1/1  full-range]
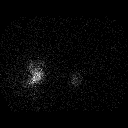

[4 of 4 positions shown; findings below may reference images not displayed]

FINDINGS: Expected location of the stomach in the left upper quadrant.

At 1 hour, patient vomited, with vomit containing a portion of the
ingested dose.

Procedure was therefore terminated since accurate gastric emptying
could not be subsequently calculated.
IMPRESSION: Vomiting a portion of the ingested tracer at 1 hour leading to
termination of the procedure.

## 2019-04-08 NOTE — Progress Notes (Deleted)
   Subjective:    Patient ID: Perlie Mayo, female    DOB: 11-03-1980, 38 y.o.   MRN: [id number]  HPI Brinae Woods. Duran is a 38 year old female with a past medical history of morbid obesity, depression, anxiety, DM II x 3 years, GERD  Last weight in office 521lbs  Past Medical History:  Diagnosis Date  . Anxiety   . Carpal tunnel syndrome on left 10/12/2016  . Cellulitis and abscess of leg   . Chronic abdominal pain   . Depression   . Diabetes mellitus without complication ([place])   . Gallstones   . GERD (gastroesophageal reflux disease) 08/13/2017  . Headache   . Hypothyroid 01/03/2018  . Lymphedema   . Sleep apnea    DX with sleep apnea - unable to use c-pap  . Umbilical hernia    Past Surgical History:  Procedure Laterality Date  . CESAREAN SECTION  11/09/01  . HERNIA REPAIR    . INSERTION OF MESH N/A 01/02/2013   Procedure: INSERTION OF MESH;  Surgeon: Madilyn Hook, DO;  Location: [facility];  Service: General;  Laterality: N/A;  . UMBILICAL HERNIA REPAIR N/A 01/02/2013   Procedure: LAPAROSCOPIC UMBILICAL HERNIA;  Surgeon: Madilyn Hook, DO;  Location: [facility];  Service: General;  Laterality: N/A;       Review of Systems     Objective:   Physical Exam        Assessment & Plan:

## 2019-04-10 ENCOUNTER — Ambulatory Visit (INDEPENDENT_AMBULATORY_CARE_PROVIDER_SITE_OTHER): Payer: Medicaid Other | Admitting: Nurse Practitioner

## 2019-08-14 ENCOUNTER — Other Ambulatory Visit: Payer: Self-pay

## 2019-08-14 ENCOUNTER — Encounter (HOSPITAL_COMMUNITY): Payer: Self-pay | Admitting: Emergency Medicine

## 2019-08-14 ENCOUNTER — Inpatient Hospital Stay (HOSPITAL_COMMUNITY): Payer: Medicaid Other

## 2019-08-14 ENCOUNTER — Inpatient Hospital Stay (HOSPITAL_COMMUNITY)
Admission: EM | Admit: 2019-08-14 | Discharge: 2019-08-17 | DRG: 872 | Disposition: A | Discharge status: Home / Self Care | Payer: Medicaid Other | Attending: Family Medicine | Admitting: Family Medicine

## 2019-08-14 ENCOUNTER — Emergency Department (HOSPITAL_COMMUNITY): Payer: Medicaid Other

## 2019-08-14 DIAGNOSIS — E039 Hypothyroidism, unspecified: Secondary | ICD-10-CM | POA: Diagnosis present

## 2019-08-14 DIAGNOSIS — Z8249 Family history of ischemic heart disease and other diseases of the circulatory system: Secondary | ICD-10-CM

## 2019-08-14 DIAGNOSIS — G4733 Obstructive sleep apnea (adult) (pediatric): Secondary | ICD-10-CM | POA: Diagnosis present

## 2019-08-14 DIAGNOSIS — Z885 Allergy status to narcotic agent status: Secondary | ICD-10-CM

## 2019-08-14 DIAGNOSIS — Z6841 Body Mass Index (BMI) 40.0 and over, adult: Secondary | ICD-10-CM | POA: Diagnosis not present

## 2019-08-14 DIAGNOSIS — L03119 Cellulitis of unspecified part of limb: Secondary | ICD-10-CM | POA: Diagnosis present

## 2019-08-14 DIAGNOSIS — G4730 Sleep apnea, unspecified: Secondary | ICD-10-CM | POA: Diagnosis not present

## 2019-08-14 DIAGNOSIS — Z833 Family history of diabetes mellitus: Secondary | ICD-10-CM

## 2019-08-14 DIAGNOSIS — L02416 Cutaneous abscess of left lower limb: Secondary | ICD-10-CM | POA: Diagnosis present

## 2019-08-14 DIAGNOSIS — Z888 Allergy status to other drugs, medicaments and biological substances status: Secondary | ICD-10-CM

## 2019-08-14 DIAGNOSIS — Z794 Long term (current) use of insulin: Secondary | ICD-10-CM | POA: Diagnosis not present

## 2019-08-14 DIAGNOSIS — Z91040 Latex allergy status: Secondary | ICD-10-CM | POA: Diagnosis not present

## 2019-08-14 DIAGNOSIS — Z882 Allergy status to sulfonamides status: Secondary | ICD-10-CM | POA: Diagnosis not present

## 2019-08-14 DIAGNOSIS — E1143 Type 2 diabetes mellitus with diabetic autonomic (poly)neuropathy: Secondary | ICD-10-CM | POA: Diagnosis present

## 2019-08-14 DIAGNOSIS — L02419 Cutaneous abscess of limb, unspecified: Secondary | ICD-10-CM | POA: Diagnosis present

## 2019-08-14 DIAGNOSIS — L03116 Cellulitis of left lower limb: Secondary | ICD-10-CM | POA: Diagnosis present

## 2019-08-14 DIAGNOSIS — E119 Type 2 diabetes mellitus without complications: Secondary | ICD-10-CM

## 2019-08-14 DIAGNOSIS — Z79899 Other long term (current) drug therapy: Secondary | ICD-10-CM | POA: Diagnosis not present

## 2019-08-14 DIAGNOSIS — F329 Major depressive disorder, single episode, unspecified: Secondary | ICD-10-CM | POA: Diagnosis present

## 2019-08-14 DIAGNOSIS — G8929 Other chronic pain: Secondary | ICD-10-CM | POA: Diagnosis present

## 2019-08-14 DIAGNOSIS — M7989 Other specified soft tissue disorders: Secondary | ICD-10-CM

## 2019-08-14 DIAGNOSIS — A419 Sepsis, unspecified organism: Secondary | ICD-10-CM | POA: Diagnosis present

## 2019-08-14 DIAGNOSIS — I890 Lymphedema, not elsewhere classified: Secondary | ICD-10-CM | POA: Diagnosis present

## 2019-08-14 DIAGNOSIS — R109 Unspecified abdominal pain: Secondary | ICD-10-CM | POA: Diagnosis not present

## 2019-08-14 DIAGNOSIS — K219 Gastro-esophageal reflux disease without esophagitis: Secondary | ICD-10-CM | POA: Diagnosis present

## 2019-08-14 DIAGNOSIS — Z806 Family history of leukemia: Secondary | ICD-10-CM

## 2019-08-14 DIAGNOSIS — E1165 Type 2 diabetes mellitus with hyperglycemia: Secondary | ICD-10-CM | POA: Diagnosis present

## 2019-08-14 DIAGNOSIS — E038 Other specified hypothyroidism: Secondary | ICD-10-CM | POA: Diagnosis not present

## 2019-08-14 DIAGNOSIS — Z20822 Contact with and (suspected) exposure to covid-19: Secondary | ICD-10-CM | POA: Diagnosis present

## 2019-08-14 DIAGNOSIS — Z8349 Family history of other endocrine, nutritional and metabolic diseases: Secondary | ICD-10-CM | POA: Diagnosis not present

## 2019-08-14 DIAGNOSIS — Z79890 Hormone replacement therapy (postmenopausal): Secondary | ICD-10-CM | POA: Diagnosis not present

## 2019-08-14 DIAGNOSIS — K3184 Gastroparesis: Secondary | ICD-10-CM | POA: Diagnosis present

## 2019-08-14 HISTORY — DX: Gastroparesis: K31.84

## 2019-08-14 LAB — COMPREHENSIVE METABOLIC PANEL
ALT: 22 U/L (ref 0–44)
AST: 18 U/L (ref 15–41)
Albumin: 3.5 g/dL (ref 3.5–5.0)
Alkaline Phosphatase: 84 U/L (ref 38–126)
Anion gap: 9 (ref 5–15)
BUN: 9 mg/dL (ref 6–20)
CO2: 23 mmol/L (ref 22–32)
Calcium: 8.4 mg/dL — ABNORMAL LOW (ref 8.9–10.3)
Chloride: 101 mmol/L (ref 98–111)
Creatinine, Ser: 0.87 mg/dL (ref 0.44–1.00)
GFR calc Af Amer: >60 mL/min (ref >60)
GFR calc non Af Amer: >60 mL/min (ref >60)
Glucose, Bld: 139 mg/dL — ABNORMAL HIGH (ref 70–99)
Potassium: 3.7 mmol/L (ref 3.5–5.1)
Sodium: 133 mmol/L — ABNORMAL LOW (ref 135–145)
Total Bilirubin: 0.8 mg/dL (ref 0.3–1.2)
Total Protein: 7.9 g/dL (ref 6.5–8.1)

## 2019-08-14 LAB — BASIC METABOLIC PANEL
Anion gap: 11 (ref 5–15)
BUN: 10 mg/dL (ref 6–20)
CO2: 21 mmol/L — ABNORMAL LOW (ref 22–32)
Calcium: 8.9 mg/dL (ref 8.9–10.3)
Chloride: 102 mmol/L (ref 98–111)
Creatinine, Ser: 0.91 mg/dL (ref 0.44–1.00)
GFR calc Af Amer: >60 mL/min (ref >60)
GFR calc non Af Amer: >60 mL/min (ref >60)
Glucose, Bld: 176 mg/dL — ABNORMAL HIGH (ref 70–99)
Potassium: 3.7 mmol/L (ref 3.5–5.1)
Sodium: 134 mmol/L — ABNORMAL LOW (ref 135–145)

## 2019-08-14 LAB — CBC WITH DIFFERENTIAL/PLATELET
Abs Immature Granulocytes: 0.05 10*3/uL (ref 0.00–0.07)
Basophils Absolute: 0 10*3/uL (ref 0.0–0.1)
Basophils Relative: 0 %
Eosinophils Absolute: 0.2 10*3/uL (ref 0.0–0.5)
Eosinophils Relative: 1 %
HCT: 42.5 % (ref 36.0–46.0)
Hemoglobin: 13.5 g/dL (ref 12.0–15.0)
Immature Granulocytes: 0 %
Lymphocytes Relative: 10 %
Lymphs Abs: 1.2 10*3/uL (ref 0.7–4.0)
MCH: 28.2 pg (ref 26.0–34.0)
MCHC: 31.8 g/dL (ref 30.0–36.0)
MCV: 88.9 fL (ref 80.0–100.0)
Monocytes Absolute: 0.3 10*3/uL (ref 0.1–1.0)
Monocytes Relative: 3 %
Neutro Abs: 10.2 10*3/uL — ABNORMAL HIGH (ref 1.7–7.7)
Neutrophils Relative %: 86 %
Platelets: 240 10*3/uL (ref 150–400)
RBC: 4.78 MIL/uL (ref 3.87–5.11)
RDW: 13.9 % (ref 11.5–15.5)
WBC: 11.9 10*3/uL — ABNORMAL HIGH (ref 4.0–10.5)
nRBC: 0 % (ref 0.0–0.2)

## 2019-08-14 LAB — URINALYSIS, ROUTINE W REFLEX MICROSCOPIC
Bilirubin Urine: NEGATIVE
Glucose, UA: NEGATIVE mg/dL
Ketones, ur: NEGATIVE mg/dL
Nitrite: NEGATIVE
Protein, ur: NEGATIVE mg/dL
Specific Gravity, Urine: 1.023 (ref 1.005–1.030)
pH: 5 (ref 5.0–8.0)

## 2019-08-14 LAB — CBC
HCT: 42.8 % (ref 36.0–46.0)
Hemoglobin: 13.5 g/dL (ref 12.0–15.0)
MCH: 28.2 pg (ref 26.0–34.0)
MCHC: 31.5 g/dL (ref 30.0–36.0)
MCV: 89.4 fL (ref 80.0–100.0)
Platelets: 258 10*3/uL (ref 150–400)
RBC: 4.79 MIL/uL (ref 3.87–5.11)
RDW: 14.1 % (ref 11.5–15.5)
WBC: 11.6 10*3/uL — ABNORMAL HIGH (ref 4.0–10.5)
nRBC: 0 % (ref 0.0–0.2)

## 2019-08-14 LAB — PROTIME-INR
INR: 1.1 (ref 0.8–1.2)
Prothrombin Time: 14.2 seconds (ref 11.4–15.2)

## 2019-08-14 LAB — GLUCOSE, CAPILLARY
Glucose-Capillary: 167 mg/dL — ABNORMAL HIGH (ref 70–99)
Glucose-Capillary: 171 mg/dL — ABNORMAL HIGH (ref 70–99)
Glucose-Capillary: 164 mg/dL — ABNORMAL HIGH (ref 70–99)
Glucose-Capillary: 132 mg/dL — ABNORMAL HIGH (ref 70–99)
Glucose-Capillary: 145 mg/dL — ABNORMAL HIGH (ref 70–99)

## 2019-08-14 LAB — SARS CORONAVIRUS 2 (TAT 6-24 HRS): SARS Coronavirus 2: NEGATIVE

## 2019-08-14 LAB — HIV ANTIBODY (ROUTINE TESTING W REFLEX): HIV Screen 4th Generation wRfx: NONREACTIVE

## 2019-08-14 LAB — LACTIC ACID, PLASMA
Lactic Acid, Venous: 1.6 mmol/L (ref 0.5–1.9)
Lactic Acid, Venous: 1.4 mmol/L (ref 0.5–1.9)

## 2019-08-14 MED ORDER — VANCOMYCIN HCL 1500 MG/300ML IV SOLN: 1500.0000 mg | Freq: Three times a day (TID) | INTRAVENOUS | Status: DC

## 2019-08-14 MED ORDER — POLYETHYLENE GLYCOL 3350 17 G PO PACK: 17.0000 g | PACK | Freq: Every day | ORAL | Status: DC | PRN

## 2019-08-14 MED ORDER — FAMOTIDINE 20 MG PO TABS
20.0000 mg | ORAL_TABLET | Freq: Two times a day (BID) | ORAL | Status: DC
  Administered 2019-08-14 – 2019-08-17 (×6): 20 mg via ORAL
  Filled 2019-08-14 (×7): qty 1

## 2019-08-14 MED ORDER — METOCLOPRAMIDE HCL 5 MG/ML IJ SOLN: 10.0000 mg | Freq: Four times a day (QID) | INTRAMUSCULAR | Status: DC | PRN

## 2019-08-14 MED ORDER — CEFAZOLIN SODIUM-DEXTROSE 2-4 GM/100ML-% IV SOLN
2.0000 g | Freq: Three times a day (TID) | INTRAVENOUS | Status: DC
  Administered 2019-08-14 – 2019-08-16 (×6): 2 g via INTRAVENOUS
  Filled 2019-08-14 (×6): qty 100

## 2019-08-14 MED ORDER — ONDANSETRON HCL 4 MG PO TABS
4.0000 mg | ORAL_TABLET | Freq: Four times a day (QID) | ORAL | Status: DC | PRN
  Administered 2019-08-15 – 2019-08-16 (×2): 4 mg via ORAL
  Filled 2019-08-14 (×2): qty 1

## 2019-08-14 MED ORDER — ACETAMINOPHEN 325 MG PO TABS: 650.0000 mg | ORAL_TABLET | Freq: Four times a day (QID) | ORAL | Status: DC | PRN

## 2019-08-14 MED ORDER — ENOXAPARIN SODIUM 40 MG/0.4ML ~~LOC~~ SOLN: 40.0000 mg | SUBCUTANEOUS | Status: DC

## 2019-08-14 MED ORDER — ONDANSETRON HCL 4 MG/2ML IJ SOLN
4.0000 mg | Freq: Four times a day (QID) | INTRAMUSCULAR | Status: DC | PRN
  Administered 2019-08-14 – 2019-08-16 (×3): 4 mg via INTRAVENOUS
  Filled 2019-08-14 (×3): qty 2

## 2019-08-14 MED ORDER — SODIUM CHLORIDE 0.9 % IV SOLN
250.0000 mL | INTRAVENOUS | Status: DC | PRN
  Administered 2019-08-15: 250 mL via INTRAVENOUS

## 2019-08-14 MED ORDER — LEVOTHYROXINE SODIUM 50 MCG PO TABS
50.0000 ug | ORAL_TABLET | Freq: Every day | ORAL | Status: DC
  Filled 2019-08-14 (×2): qty 1

## 2019-08-14 MED ORDER — SODIUM CHLORIDE 0.9% FLUSH
3.0000 mL | Freq: Two times a day (BID) | INTRAVENOUS | Status: DC
  Administered 2019-08-14 – 2019-08-17 (×6): 3 mL via INTRAVENOUS

## 2019-08-14 MED ORDER — SODIUM CHLORIDE 0.9% FLUSH: 3.0000 mL | INTRAVENOUS | Status: DC | PRN

## 2019-08-14 MED ORDER — TRAMADOL HCL 50 MG PO TABS
50.0000 mg | ORAL_TABLET | Freq: Three times a day (TID) | ORAL | Status: DC | PRN
  Administered 2019-08-15: 50 mg via ORAL
  Filled 2019-08-14: qty 1

## 2019-08-14 MED ORDER — INSULIN ASPART 100 UNIT/ML ~~LOC~~ SOLN: 0.0000 [IU] | Freq: Every day | SUBCUTANEOUS | Status: DC

## 2019-08-14 MED ORDER — ACETAMINOPHEN 325 MG PO TABS
650.0000 mg | ORAL_TABLET | Freq: Once | ORAL | Status: AC
  Administered 2019-08-14: 650 mg via ORAL
  Filled 2019-08-14: qty 2

## 2019-08-14 MED ORDER — VANCOMYCIN HCL 1500 MG/300ML IV SOLN
1500.0000 mg | Freq: Once | INTRAVENOUS | Status: AC
  Administered 2019-08-14: 1500 mg via INTRAVENOUS
  Filled 2019-08-14: qty 300

## 2019-08-14 MED ORDER — INSULIN GLARGINE 100 UNIT/ML ~~LOC~~ SOLN
65.0000 [IU] | Freq: Every day | SUBCUTANEOUS | Status: DC
  Administered 2019-08-14 – 2019-08-16 (×3): 65 [IU] via SUBCUTANEOUS
  Filled 2019-08-14 (×5): qty 0.65

## 2019-08-14 MED ORDER — CEFAZOLIN SODIUM-DEXTROSE 1-4 GM/50ML-% IV SOLN
1.0000 g | Freq: Once | INTRAVENOUS | Status: AC
  Administered 2019-08-14: 1 g via INTRAVENOUS
  Filled 2019-08-14 (×2): qty 50

## 2019-08-14 MED ORDER — ENOXAPARIN SODIUM 120 MG/0.8ML ~~LOC~~ SOLN
120.0000 mg | SUBCUTANEOUS | Status: DC
  Administered 2019-08-14 – 2019-08-17 (×4): 120 mg via SUBCUTANEOUS
  Filled 2019-08-14 (×4): qty 0.8

## 2019-08-14 MED ORDER — VANCOMYCIN HCL IN DEXTROSE 1-5 GM/200ML-% IV SOLN
1000.0000 mg | Freq: Once | INTRAVENOUS | Status: AC
  Administered 2019-08-14: 07:00:00 1000 mg via INTRAVENOUS
  Filled 2019-08-14: qty 200

## 2019-08-14 MED ORDER — ACETAMINOPHEN 650 MG RE SUPP: 650.0000 mg | Freq: Four times a day (QID) | RECTAL | Status: DC | PRN

## 2019-08-14 MED ORDER — BISACODYL 10 MG RE SUPP: 10.0000 mg | Freq: Every day | RECTAL | Status: DC | PRN

## 2019-08-14 MED ORDER — INSULIN ASPART 100 UNIT/ML ~~LOC~~ SOLN
0.0000 [IU] | Freq: Three times a day (TID) | SUBCUTANEOUS | Status: DC
  Administered 2019-08-14 (×3): 2 [IU] via SUBCUTANEOUS
  Administered 2019-08-15 (×2): 3 [IU] via SUBCUTANEOUS
  Administered 2019-08-15 – 2019-08-17 (×4): 2 [IU] via SUBCUTANEOUS

## 2019-08-14 NOTE — ED Notes (Signed)
Pt states she yearly gets cellulitis in her leg. And always gets admitted for a week.  Knew she had a fever but didn't take anything for it

## 2019-08-14 NOTE — ED Triage Notes (Signed)
Pt c/o left upper leg pain and fever that started tonight.

## 2019-08-14 NOTE — Progress Notes (Signed)
Pharmacy Antibiotic Note  Brittany [name] is a 39 y.o. female admitted on 08/14/2019 with cellulitis.  Pharmacy has been consulted for vancomycin dosing.  Plan: Loading dose:  vancomycin 2.5g IV total  (1.5g + 1g) Maintenance dose: vancomycin 1.5 g IV q8h Goal vancomycin trough range: 15-20  mcg/mL Pharmacy will continue to monitor renal function, vancomycin troughs as clinically appropriate,  cultures and patient progress.   Height: 5\' 11"  (180.3 cm) Weight: (!) 550 lb (249.5 kg) IBW/kg (Calculated) : 70.8  Temp (24hrs), Avg:101 F (38.3 C), Min:101 F (38.3 C), Max:101 F (38.3 C)  Recent Labs  Lab 08/14/19 0228  WBC 11.9*  CREATININE 0.87  LATICACIDVEN 1.6    Estimated Creatinine Clearance: 193.1 mL/min (by C-G formula based on SCr of 0.87 mg/dL).    Allergies  Allergen Reactions  . Paxil [Paroxetine Hcl]     Suicidal thoughts  . Morphine And Related Other (See Comments)    "it just freaks me out"  . Latex Itching, Swelling and Rash  . Sulfa Antibiotics Nausea And Vomiting and Rash    Antimicrobials this admission: vancomycin 2/3>>  cefazolin 2/3 >>     Microbiology results: nothing ordered at this time    Thank you for allowing pharmacy to be a part of this patient's care.  10/12/19 08/14/2019 5:26 AM

## 2019-08-14 NOTE — Progress Notes (Signed)
ASSUMPTION OF CARE NOTE   08/14/2019 12:43 PM  Brittany [name] was seen and examined.  The H&P by the admitting provider, orders, imaging was reviewed.  Please see new orders.  Will continue to follow.   Vitals:   08/14/19 0744 08/14/19 0837  BP: 102/75   Pulse: (!) 101   Resp: 20   Temp: 98.5 F (36.9 C)   SpO2: 98% 99%    Results for orders placed or performed during the hospital encounter of 08/14/19  Culture, blood (Routine x 2)   Specimen: BLOOD  Result Value Ref Range   Specimen Description BLOOD LEFT ANTECUBITAL    Special Requests      BOTTLES DRAWN AEROBIC AND ANAEROBIC Blood Culture adequate volume   Culture      NO GROWTH < 12 HOURS Performed at [facility], [address], [place], [place] [address]    Report Status PENDING   Culture, blood (Routine x 2)   Specimen: BLOOD  Result Value Ref Range   Specimen Description BLOOD RIGHT ANTECUBITAL    Special Requests      BOTTLES DRAWN AEROBIC AND ANAEROBIC Blood Culture results may not be optimal due to an inadequate volume of blood received in culture bottles   Culture      NO GROWTH < 12 HOURS Performed at [facility], [address] [place], [place] [address]    Report Status PENDING   Comprehensive metabolic panel  Result Value Ref Range   Sodium 133 (L) 135 - 145 mmol/L   Potassium 3.7 3.5 - 5.1 mmol/L   Chloride 101 98 - 111 mmol/L   CO2 23 22 - 32 mmol/L   Glucose, Bld 139 (H) 70 - 99 mg/dL   BUN 9 6 - 20 mg/dL   Creatinine, Ser 7.61 0.44 - 1.00 mg/dL   Calcium 8.4 (L) 8.9 - 10.3 mg/dL   Total Protein 7.9 6.5 - 8.1 g/dL   Albumin 3.5 3.5 - 5.0 g/dL   AST 18 15 - 41 U/L   ALT 22 0 - 44 U/L   Alkaline Phosphatase 84 38 - 126 U/L   Total Bilirubin 0.8 0.3 - 1.2 mg/dL   GFR calc non Af Amer >60 >60 mL/min   GFR calc Af Amer >60 >60 mL/min   Anion gap 9 5 - 15  Lactic acid, plasma  Result Value Ref Range   Lactic Acid, Venous 1.6 0.5 - 1.9 mmol/L  Lactic acid, plasma  Result Value Ref Range   Lactic Acid, Venous 1.4 0.5 - 1.9 mmol/L  CBC with Differential  Result Value Ref Range   WBC 11.9 (H) 4.0 - 10.5 K/uL   RBC 4.78 3.87 - 5.11 MIL/uL   Hemoglobin 13.5 12.0 - 15.0 g/dL   HCT 95.0 93.2 - 67.1 %   MCV 88.9 80.0 - 100.0 fL   MCH 28.2 26.0 - 34.0 pg   MCHC 31.8 30.0 - 36.0 g/dL   RDW 24.5 80.9 - 98.3 %   Platelets 240 150 - 400 K/uL   nRBC 0.0 0.0 - 0.2 %   Neutrophils Relative % 86 %   Neutro Abs 10.2 (H) 1.7 - 7.7 K/uL   Lymphocytes Relative 10 %   Lymphs Abs 1.2 0.7 - 4.0 K/uL   Monocytes Relative 3 %   Monocytes Absolute 0.3 0.1 - 1.0 K/uL   Eosinophils Relative 1 %   Eosinophils Absolute 0.2 0.0 - 0.5 K/uL   Basophils Relative 0 %   Basophils Absolute  0.0 0.0 - 0.1 K/uL   Immature Granulocytes 0 %   Abs Immature Granulocytes 0.05 0.00 - 0.07 K/uL  Protime-INR  Result Value Ref Range   Prothrombin Time 14.2 11.4 - 15.2 seconds   INR 1.1 0.8 - 1.2  Urinalysis, Routine w reflex microscopic  Result Value Ref Range   Color, Urine YELLOW YELLOW   APPearance HAZY (A) CLEAR   Specific Gravity, Urine 1.023 1.005 - 1.030   pH 5.0 5.0 - 8.0   Glucose, UA NEGATIVE NEGATIVE mg/dL   Hgb urine dipstick LARGE (A) NEGATIVE   Bilirubin Urine NEGATIVE NEGATIVE   Ketones, ur NEGATIVE NEGATIVE mg/dL   Protein, ur NEGATIVE NEGATIVE mg/dL   Nitrite NEGATIVE NEGATIVE   Leukocytes,Ua LARGE (A) NEGATIVE   RBC / HPF 0-5 0 - 5 RBC/hpf   WBC, UA 21-50 0 - 5 WBC/hpf   Bacteria, UA RARE (A) NONE SEEN   Squamous Epithelial / LPF 6-10 0 - 5   Mucus PRESENT   Basic metabolic panel  Result Value Ref Range   Sodium 134 (L) 135 - 145 mmol/L   Potassium 3.7 3.5 - 5.1 mmol/L   Chloride 102 98 - 111 mmol/L   CO2 21 (L) 22 - 32 mmol/L   Glucose, Bld 176 (H) 70 - 99 mg/dL   BUN 10 6 - 20 mg/dL   Creatinine, Ser 0.91 0.44 - 1.00 mg/dL   Calcium 8.9 8.9 - 10.3 mg/dL   GFR calc non Af Amer >60 >60 mL/min   GFR calc Af Amer >60 >60 mL/min   Anion gap 11 5 - 15  CBC  Result Value Ref  Range   WBC 11.6 (H) 4.0 - 10.5 K/uL   RBC 4.79 3.87 - 5.11 MIL/uL   Hemoglobin 13.5 12.0 - 15.0 g/dL   HCT 42.8 36.0 - 46.0 %   MCV 89.4 80.0 - 100.0 fL   MCH 28.2 26.0 - 34.0 pg   MCHC 31.5 30.0 - 36.0 g/dL   RDW 14.1 11.5 - 15.5 %   Platelets 258 150 - 400 K/uL   nRBC 0.0 0.0 - 0.2 %  HIV Antibody (routine testing w rflx)  Result Value Ref Range   HIV Screen 4th Generation wRfx NON REACTIVE NON REACTIVE  Glucose, capillary  Result Value Ref Range   Glucose-Capillary 167 (H) 70 - 99 mg/dL  Glucose, capillary  Result Value Ref Range   Glucose-Capillary 171 (H) 70 - 99 mg/dL  Glucose, capillary  Result Value Ref Range   Glucose-Capillary 164 (H) 70 - 99 mg/dL    Murvin Natal, MD [facility]   08/14/2019  1:32 AM How to contact the [facility] Attending or Consulting provider 7A - 7P or covering provider during after hours 7P -7A, for this patient?  1. Check the care team in [facility] and look for a) attending/consulting [facility] provider listed and b) the [facility] team listed 2. Log into [email or web address] and use [facility]'s universal password to access. If you do not have the password, please contact the hospital operator. 3. Locate the [facility] provider you are looking for under [facility] and page to a number that you can be directly reached. 4. If you still have difficulty reaching the provider, please page the [facility] (Director on Call) for the Hospitalists listed on amion for assistance.

## 2019-08-14 NOTE — Progress Notes (Signed)
MD notified of HR 117 and MEWS of 2

## 2019-08-14 NOTE — ED Notes (Signed)
Called for med

## 2019-08-14 NOTE — ED Notes (Signed)
edp in room  

## 2019-08-14 NOTE — ED Provider Notes (Signed)
[facility] EMERGENCY DEPARTMENT Provider Note   CSN: [id number] Arrival date & time: 08/14/19  0129   Time seen 2:20 AM  History Chief Complaint  Patient presents with  . Leg Pain    Brittany [name] is a 39 y.o. female.  HPI   Patient has a history of lymphedema of her legs and states she gets cellulitis in her left leg fairly frequently.  The last episode was September 2019.  She states about an hour prior to arrival she noted that her left leg was getting red and was warm to touch.  She started having chills and when she took her temperature it started out at 98.9 and then went up to 100 at which point she came to the ED.  She has chronic nausea and vomiting and abdominal pain from gastroparesis.  She denies any change tonight.  She denies sore throat, rhinorrhea, coughing, or diarrhea.  PCP Dr. Adele Barthel, [facility] family medicine   Past Medical History:  Diagnosis Date  . Anxiety   . Carpal tunnel syndrome on left 10/12/2016  . Cellulitis and abscess of leg   . Chronic abdominal pain   . Depression   . Diabetes mellitus without complication (HCC)   . Gallstones   . Gastroparesis   . GERD (gastroesophageal reflux disease) 08/13/2017  . Headache   . Hypothyroid 01/03/2018  . Lymphedema   . Sleep apnea    DX with sleep apnea - unable to use c-pap  . Umbilical hernia     Patient Active Problem List   Diagnosis Date Noted  . Sepsis due to cellulitis (HCC) 03/16/2018  . Hypothyroid 01/03/2018  . GERD (gastroesophageal reflux disease) 08/13/2017  . Uncontrolled type 2 diabetes mellitus with hyperglycemia, with long-term current use of insulin (HCC) 08/13/2017  . Sepsis (HCC) 08/11/2017  . Chronic abdominal pain   . Pelvic lymphadenopathy   . Splenomegaly   . Non-intractable vomiting with nausea   . Encounter for IUD removal 04/20/2017  . Pressure injury of skin 10/24/2016  . Cellulitis and abscess of leg 10/23/2016  . Sleep apnea 10/23/2016  . Diabetes mellitus without  complication (HCC) 10/23/2016  . Morbid obesity with body mass index (BMI) greater than or equal to 70 in adult [facility]) 10/23/2016  . Carpal tunnel syndrome on left 10/12/2016  . Candidal intertrigo 12/31/2014  . Morbid obesity (HCC) 12/29/2014  . Hyponatremia 12/29/2014  . Cellulitis of left lower extremity 12/29/2014    Past Surgical History:  Procedure Laterality Date  . CESAREAN SECTION  11/09/01  . HERNIA REPAIR    . INSERTION OF MESH N/A 01/02/2013   Procedure: INSERTION OF MESH;  Surgeon: Lodema Pilot, DO;  Location: [facility] ORS;  Service: General;  Laterality: N/A;  . UMBILICAL HERNIA REPAIR N/A 01/02/2013   Procedure: LAPAROSCOPIC UMBILICAL HERNIA;  Surgeon: Lodema Pilot, DO;  Location: [facility] ORS;  Service: General;  Laterality: N/A;     OB History    Gravida  4   Para  1   Term  1   Preterm      AB  2   Living  1     SAB  2   TAB      Ectopic      Multiple      Live Births  1           Family History  Problem Relation Age of Onset  . Cancer Mother        leukemia  . Diabetes  Mother   . Hypertension Mother   . Hyperlipidemia Mother   . Other Father        Sciatica, kidney stones  . Alcohol abuse Paternal Grandfather   . Other Paternal Grandmother        bowel obstruction; pneumonia  . Hypertension Maternal Grandmother   . Diabetes Maternal Grandmother   . Cancer Maternal Grandfather        lung  . ADD / ADHD Daughter   . Depression Daughter   . Anxiety disorder Daughter     Social History   Tobacco Use  . Smoking status: Never Smoker  . Smokeless tobacco: Never Used  Substance Use Topics  . Alcohol use: No  . Drug use: No  Uses a electric wheelchair or hoverround Lives with spouse  Home Medications Prior to Admission medications   Medication Sig Start Date End Date Taking? Authorizing Provider  acetaminophen (TYLENOL) 500 MG tablet Take 1,000 mg by mouth every 6 (six) hours as needed for mild pain.    [provider]  famotidine  (PEPCID) 20 MG tablet Take 1 tablet (20 mg total) by mouth 2 (two) times daily. 02/04/18   Samuel Jester, DO  insulin aspart (NOVOLOG) 100 UNIT/ML injection SLIDING SCALE 3 TIMES DAILY WITH MEALS: For blood sugar 70-120-no insulin. Blood sugar 121-150 take 5 units. Blood sugar 151-208 take 8 units. Blood sugar 201-250 take 12 units. Blood sugar 251-300 take 17 units. Blood sugar 301-350 take 20 units. Blood sugar 351-400 take 25 units. Blood sugar greater than 400 take 28 units and call your doctor. 03/27/16   Devoria Albe, MD  insulin glargine (LANTUS) 100 UNIT/ML injection Inject 0.65 mLs (65 Units total) into the skin at bedtime. 08/13/17   Dhungel, Theda Belfast, MD  levothyroxine (SYNTHROID) 50 MCG tablet Take 1 tablet (50 mcg total) by mouth daily before breakfast. 01/03/18   Tilda Burrow, MD    Allergies    Paxil [paroxetine hcl], Morphine and related, Latex, and Sulfa antibiotics  Review of Systems   Review of Systems  All other systems reviewed and are negative.   Physical Exam Updated Vital Signs BP 106/89   Pulse (!) 117   Temp (!) 101 F (38.3 C) (Oral)   Resp 20   Ht 5\' 11"  (1.803 m)   Wt (!) 249.5 kg   SpO2 96%   BMI 76.71 kg/m   Physical Exam Vitals and nursing note reviewed.  Constitutional:      Appearance: She is obese.  HENT:     Head: Normocephalic and atraumatic.     Right Ear: External ear normal.     Left Ear: External ear normal.  Eyes:     Extraocular Movements: Extraocular movements intact.     Conjunctiva/sclera: Conjunctivae normal.     Pupils: Pupils are equal, round, and reactive to light.  Cardiovascular:     Rate and Rhythm: Regular rhythm. Tachycardia present.     Pulses: Normal pulses.     Heart sounds: No murmur.  Pulmonary:     Effort: Pulmonary effort is normal. No respiratory distress.     Breath sounds: Normal breath sounds.  Musculoskeletal:     Cervical back: Normal range of motion.  Skin:    General: Skin is warm and dry.      Findings: Erythema present.  Neurological:     General: No focal deficit present.     Mental Status: She is alert and oriented to person, place, and time.  Cranial Nerves: No cranial nerve deficit.  Psychiatric:        Mood and Affect: Mood normal.        Behavior: Behavior normal.        Thought Content: Thought content normal.        ED Results / Procedures / Treatments   Labs (all labs ordered are listed, but only abnormal results are displayed) Results for orders placed or performed during the hospital encounter of 08/14/19  Comprehensive metabolic panel  Result Value Ref Range   Sodium 133 (L) 135 - 145 mmol/L   Potassium 3.7 3.5 - 5.1 mmol/L   Chloride 101 98 - 111 mmol/L   CO2 23 22 - 32 mmol/L   Glucose, Bld 139 (H) 70 - 99 mg/dL   BUN 9 6 - 20 mg/dL   Creatinine, Ser 0.87 0.44 - 1.00 mg/dL   Calcium 8.4 (L) 8.9 - 10.3 mg/dL   Total Protein 7.9 6.5 - 8.1 g/dL   Albumin 3.5 3.5 - 5.0 g/dL   AST 18 15 - 41 U/L   ALT 22 0 - 44 U/L   Alkaline Phosphatase 84 38 - 126 U/L   Total Bilirubin 0.8 0.3 - 1.2 mg/dL   GFR calc non Af Amer >60 >60 mL/min   GFR calc Af Amer >60 >60 mL/min   Anion gap 9 5 - 15  Lactic acid, plasma  Result Value Ref Range   Lactic Acid, Venous 1.6 0.5 - 1.9 mmol/L  Lactic acid, plasma  Result Value Ref Range   Lactic Acid, Venous 1.4 0.5 - 1.9 mmol/L  CBC with Differential  Result Value Ref Range   WBC 11.9 (H) 4.0 - 10.5 K/uL   RBC 4.78 3.87 - 5.11 MIL/uL   Hemoglobin 13.5 12.0 - 15.0 g/dL   HCT 42.5 36.0 - 46.0 %   MCV 88.9 80.0 - 100.0 fL   MCH 28.2 26.0 - 34.0 pg   MCHC 31.8 30.0 - 36.0 g/dL   RDW 13.9 11.5 - 15.5 %   Platelets 240 150 - 400 K/uL   nRBC 0.0 0.0 - 0.2 %   Neutrophils Relative % 86 %   Neutro Abs 10.2 (H) 1.7 - 7.7 K/uL   Lymphocytes Relative 10 %   Lymphs Abs 1.2 0.7 - 4.0 K/uL   Monocytes Relative 3 %   Monocytes Absolute 0.3 0.1 - 1.0 K/uL   Eosinophils Relative 1 %   Eosinophils Absolute 0.2 0.0 - 0.5  K/uL   Basophils Relative 0 %   Basophils Absolute 0.0 0.0 - 0.1 K/uL   Immature Granulocytes 0 %   Abs Immature Granulocytes 0.05 0.00 - 0.07 K/uL  Protime-INR  Result Value Ref Range   Prothrombin Time 14.2 11.4 - 15.2 seconds   INR 1.1 0.8 - 1.2  Urinalysis, Routine w reflex microscopic  Result Value Ref Range   Color, Urine YELLOW YELLOW   APPearance HAZY (A) CLEAR   Specific Gravity, Urine 1.023 1.005 - 1.030   pH 5.0 5.0 - 8.0   Glucose, UA NEGATIVE NEGATIVE mg/dL   Hgb urine dipstick LARGE (A) NEGATIVE   Bilirubin Urine NEGATIVE NEGATIVE   Ketones, ur NEGATIVE NEGATIVE mg/dL   Protein, ur NEGATIVE NEGATIVE mg/dL   Nitrite NEGATIVE NEGATIVE   Leukocytes,Ua LARGE (A) NEGATIVE   RBC / HPF 0-5 0 - 5 RBC/hpf   WBC, UA 21-50 0 - 5 WBC/hpf   Bacteria, UA RARE (A) NONE SEEN   Squamous Epithelial / LPF  6-10 0 - 5   Mucus PRESENT   Basic metabolic panel  Result Value Ref Range   Sodium 134 (L) 135 - 145 mmol/L   Potassium 3.7 3.5 - 5.1 mmol/L   Chloride 102 98 - 111 mmol/L   CO2 21 (L) 22 - 32 mmol/L   Glucose, Bld 176 (H) 70 - 99 mg/dL   BUN 10 6 - 20 mg/dL   Creatinine, Ser 3.41 0.44 - 1.00 mg/dL   Calcium 8.9 8.9 - 96.2 mg/dL   GFR calc non Af Amer >60 >60 mL/min   GFR calc Af Amer >60 >60 mL/min   Anion gap 11 5 - 15   Laboratory interpretation all normal except leukocytosis, hyperglycemia, possible UTI    EKG EKG Interpretation  Date/Time:  Wednesday August 14 2019 02:16:21 EST Ventricular Rate:  121 PR Interval:    QRS Duration: 93 QT Interval:  309 QTC Calculation: 439 R Axis:   56 Text Interpretation: Sinus tachycardia Low voltage, precordial leads Since last tracing rate faster Confirmed by Devoria Albe ([id number]) on 08/14/2019 2:20:28 AM   Radiology DG Chest Port 1 View  Result Date: 08/14/2019 CLINICAL DATA:  Fevers EXAM: PORTABLE CHEST 1 VIEW COMPARISON:  08/10/2017 FINDINGS: Cardiac shadow is stable. No focal infiltrate or sizable effusion is seen. No  bony abnormality is noted. IMPRESSION: No active disease. Electronically Signed   By: Alcide Clever M.D.   On: 08/14/2019 02:57    Procedures Procedures (including critical care time)  Medications Ordered in ED Medications  acetaminophen (TYLENOL) tablet 650 mg (650 mg Oral Given 08/14/19 0229)  ceFAZolin (ANCEF) IVPB 1 g/50 mL premix (0 g Intravenous Stopped 08/14/19 0429)    ED Course  I have reviewed the triage vital signs and the nursing notes.  Pertinent labs & imaging results that were available during my care of the patient were reviewed by me and considered in my medical decision making (see chart for details).    MDM Rules/Calculators/A&P                      Laboratory testing was started, IV was started, she was started on IV antibiotics for cellulitis.  Patient has a history of sepsis with her cellulitis.  Patient was started on IV Ancef for her cellulitis.  She was given acetaminophen for her fever.  Laboratory testing was done.  Plan will be admission.  4:07 AM Dr. Valrie Hart,, hospitalist will admit.   Final Clinical Impression(s) / ED Diagnoses Final diagnoses:  Cellulitis of left lower extremity    Rx / DC Orders  Plan admission  Devoria Albe, MD, Concha Pyo, MD 08/14/19 [phone]

## 2019-08-14 NOTE — H&P (Signed)
History and Physical    Patient Demographics:    Brittany [name] PPI:[id number] DOB: [birth year] DOA: 08/14/2019  PCP: [name], [facility]  Patient coming from: Home  I have personally briefly reviewed patient's old medical records in [facility]  Chief Complaint: Left lower extremity pain, redness, swelling, fever   Assessment & Plan:     Assessment/Plan Principal Problem:   Cellulitis and abscess of leg Active Problems:   Morbid obesity (HCC)   Sleep apnea   Diabetes mellitus without complication (HCC)   Chronic abdominal pain   GERD (gastroesophageal reflux disease)   Uncontrolled type 2 diabetes mellitus with hyperglycemia, with long-term current use of insulin (HCC)   Hypothyroid     Principal Problem: Sepsis from cellulitis of the Left lower extremity.  Patient noted to be febrile, tachycardic, tachypnea on presentation.   Blood cultures done on admission and will need to be followed Started her on IV vancomycin  Trend the lactate.  Hydrate and get venous duplex of the Left lower extremity.  Elevate the right lower extremity.  Pain control.    Other Active Problems: Diabetes mellitus type 2 Continue Lantus, sliding scale insulin coverage with fingerstick monitoring  Gastroparesis: Resume reglan.   GERD:  Stable, resume pepcid.   Hypothyroidism: Resume synthroid.   Morbid obesity: Recommend outpatient follow up with PCP with life style modification, diet and exercise.   Depression:  Currently not on medications.  No suicidal ideation.  H/o Sleep apnea:  Pt reports not able to tolerate CPAP.   H/o bilateral chronic lymphedema:  Pt was previously recommended to use compression stockings and pumps, but pt could not use them.   She is requesting referral to lymphedema specialist.  DVT prophylaxis: Lovenox Code Status:  Full code Family Communication: N/A  Disposition Plan: admitted for left lower extremity cellulitis     Consults called: N/A Admission status: inpatient status    HPI:     HPI: Brittany [name] is a 39 y.o. female with medical history significant of Insulin dependent DM, recurrent cellulitis of the left leg, gastroparesis, lymphaedema, GERD, sleep apnea,depression, hypothyroidism who presented to the ER with left lower extremity redness, swelling, warmth, tenderness. Has chronic lymphedema and has frequent episodes of cellulitis.  Most recent episode was in September 2019.  Has chronic nausea secondary to gastroparesis.  No change in abdominal symptoms.  No chest pain, shortness of breath, diarrhea, insect bite, sore throat, dysuria. ED Course:  Vital Signs reviewed on presentation, significant for temperature 101, heart rate 117, blood pressure 155/103, saturation 96% on room air. Labs reviewed, significant for sodium 133, potassium 3.7, chloride 101, BUN 9, creatinine 0.8, LFTs within normal meds, lactic acid 1.6, WBC count 11.9, hemoglobin 13.5, hematocrit 40, platelets 240.  INR 1.1.  Blood cultures have been sent.  SARS Covid RT-PCR pending. Imaging personally Reviewed, chest x-ray shows no acute cardiopulmonary disease. EKG personally reviewed, shows sinus tachycardia, no acute ST-T changes.    Review of systems:    Review of Systems: As per HPI otherwise 10 point review of systems negative.  All other review of systems is negative except the ones noted above in the HPI.    Past Medical and Surgical History:  Reviewed by me  Past Medical History:  Diagnosis Date  . Anxiety   . Carpal tunnel syndrome on left 10/12/2016  . Cellulitis and abscess of leg   . Chronic abdominal pain   . Depression   . Diabetes mellitus without complication (  HCC)   . Gallstones   . Gastroparesis   . GERD (gastroesophageal reflux disease) 08/13/2017  . Headache   . Hypothyroid 01/03/2018  . Lymphedema   . Sleep apnea    DX with sleep apnea - unable to use c-pap  . Umbilical hernia     Past  Surgical History:  Procedure Laterality Date  . CESAREAN SECTION  11/09/01  . HERNIA REPAIR    . INSERTION OF MESH N/A 01/02/2013   Procedure: INSERTION OF MESH;  Surgeon: Lodema Pilot, DO;  Location: [facility];  Service: General;  Laterality: N/A;  . UMBILICAL HERNIA REPAIR N/A 01/02/2013   Procedure: LAPAROSCOPIC UMBILICAL HERNIA;  Surgeon: Lodema Pilot, DO;  Location: [facility];  Service: General;  Laterality: N/A;     Social History:  Reviewed by me   reports that she has never smoked. She has never used smokeless tobacco. She reports that she does not drink alcohol or use drugs.  Allergies:    Allergies  Allergen Reactions  . Paxil [Paroxetine Hcl]     Suicidal thoughts  . Morphine And Related Other (See Comments)    "it just freaks me out"  . Latex Itching, Swelling and Rash  . Sulfa Antibiotics Nausea And Vomiting and Rash    Family History :   Family History  Problem Relation Age of Onset  . Cancer Mother        leukemia  . Diabetes Mother   . Hypertension Mother   . Hyperlipidemia Mother   . Other Father        Sciatica, kidney stones  . Alcohol abuse Paternal Grandfather   . Other Paternal Grandmother        bowel obstruction; pneumonia  . Hypertension Maternal Grandmother   . Diabetes Maternal Grandmother   . Cancer Maternal Grandfather        lung  . ADD / ADHD Daughter   . Depression Daughter   . Anxiety disorder Daughter    Family history reviewed, noted as above, not pertinent to current presentation.   Home Medications:    Prior to Admission medications   Medication Sig Start Date End Date Taking? Authorizing Provider  acetaminophen (TYLENOL) 500 MG tablet Take 1,000 mg by mouth every 6 (six) hours as needed for mild pain.    [provider]  famotidine (PEPCID) 20 MG tablet Take 1 tablet (20 mg total) by mouth 2 (two) times daily. 02/04/18   Samuel Jester, DO  insulin aspart (NOVOLOG) 100 UNIT/ML injection SLIDING SCALE 3 TIMES DAILY WITH  MEALS: For blood sugar 70-120-no insulin. Blood sugar 121-150 take 5 units. Blood sugar 151-208 take 8 units. Blood sugar 201-250 take 12 units. Blood sugar 251-300 take 17 units. Blood sugar 301-350 take 20 units. Blood sugar 351-400 take 25 units. Blood sugar greater than 400 take 28 units and call your doctor. 03/27/16   Devoria Albe, MD  insulin glargine (LANTUS) 100 UNIT/ML injection Inject 0.65 mLs (65 Units total) into the skin at bedtime. 08/13/17   Dhungel, Theda Belfast, MD  levothyroxine (SYNTHROID) 50 MCG tablet Take 1 tablet (50 mcg total) by mouth daily before breakfast. 01/03/18   Tilda Burrow, MD    Physical Exam:    Physical Exam: Vitals:   08/14/19 0141 08/14/19 0145 08/14/19 0245 08/14/19 0330  BP: (!) 155/103     Pulse: (!) 136 (!) 126    Resp: (!) 22  13 14   Temp: (!) 101 F (38.3 C)     TempSrc:  Oral     SpO2: 100% 96%    Weight:      Height:        Constitutional: NAD, calm, comfortable Vitals:   08/14/19 0141 08/14/19 0145 08/14/19 0245 08/14/19 0330  BP: (!) 155/103     Pulse: (!) 136 (!) 126    Resp: (!) 22  13 14   Temp: (!) 101 F (38.3 C)     TempSrc: Oral     SpO2: 100% 96%    Weight:      Height:       Eyes: PERRL, lids and conjunctivae normal ENMT: Mucous membranes are moist. Posterior pharynx clear of any exudate or lesions.Normal dentition.  Neck: normal, supple, no masses, no thyromegaly Respiratory: clear to auscultation bilaterally, no wheezing, no crackles. Normal respiratory effort. No accessory muscle use.  Cardiovascular: Regular rate and rhythm, no murmurs / rubs / gallops. 2+ pedal pulses. No carotid bruits.  Abdomen: no tenderness, no masses palpated. No hepatosplenomegaly. Bowel sounds positive.  Musculoskeletal: no clubbing / cyanosis. No joint deformity upper and lower extremities. Good ROM, no contractures. Normal muscle tone.  Skin: Patient has massive bilateral lymphedema in both lower extremities.  Significant redness, swelling,  warmth, tenderness over the left medial and posterior part of the thigh. Neurologic: CN 2-12 grossly intact. Sensation intact, DTR normal. Strength 5/5 in all 4.  Psychiatric: Normal judgment and insight. Alert and oriented x 3. Normal mood.    Decubitus Ulcers: Not present on admission Catheters and tubes: None  Data Review:    Labs on Admission: I have personally reviewed following labs and imaging studies  CBC: Recent Labs  Lab 08/14/19 0228  WBC 11.9*  NEUTROABS 10.2*  HGB 13.5  HCT 42.5  MCV 88.9  PLT 240   Basic Metabolic Panel: Recent Labs  Lab 08/14/19 0228  NA 133*  K 3.7  CL 101  CO2 23  GLUCOSE 139*  BUN 9  CREATININE 0.87  CALCIUM 8.4*   GFR: Estimated Creatinine Clearance: 193.1 mL/min (by C-G formula based on SCr of 0.87 mg/dL). Liver Function Tests: Recent Labs  Lab 08/14/19 0228  AST 18  ALT 22  ALKPHOS 84  BILITOT 0.8  PROT 7.9  ALBUMIN 3.5   No results for input(s): LIPASE, AMYLASE in the last 168 hours. No results for input(s): AMMONIA in the last 168 hours. Coagulation Profile: Recent Labs  Lab 08/14/19 0228  INR 1.1   Cardiac Enzymes: No results for input(s): CKTOTAL, CKMB, CKMBINDEX, TROPONINI in the last 168 hours. BNP (last 3 results) No results for input(s): PROBNP in the last 8760 hours. HbA1C: No results for input(s): HGBA1C in the last 72 hours. CBG: No results for input(s): GLUCAP in the last 168 hours. Lipid Profile: No results for input(s): CHOL, HDL, LDLCALC, TRIG, CHOLHDL, LDLDIRECT in the last 72 hours. Thyroid Function Tests: No results for input(s): TSH, T4TOTAL, FREET4, T3FREE, THYROIDAB in the last 72 hours. Anemia Panel: No results for input(s): VITAMINB12, FOLATE, FERRITIN, TIBC, IRON, RETICCTPCT in the last 72 hours. Urine analysis:    Component Value Date/Time   COLORURINE YELLOW 03/16/2018 0615   APPEARANCEUR CLEAR 03/16/2018 0615   LABSPEC 1.028 03/16/2018 0615   PHURINE 5.0 03/16/2018 0615    GLUCOSEU >=500 (A) 03/16/2018 0615   HGBUR NEGATIVE 03/16/2018 0615   BILIRUBINUR NEGATIVE 03/16/2018 0615   KETONESUR NEGATIVE 03/16/2018 0615   PROTEINUR 30 (A) 03/16/2018 0615   UROBILINOGEN 0.2 02/22/2013 0414   NITRITE NEGATIVE 03/16/2018 0615  LEUKOCYTESUR NEGATIVE 03/16/2018 0615     Imaging Results:      Radiological Exams on Admission: DG Chest Port 1 View  Result Date: 08/14/2019 CLINICAL DATA:  Fevers EXAM: PORTABLE CHEST 1 VIEW COMPARISON:  08/10/2017 FINDINGS: Cardiac shadow is stable. No focal infiltrate or sizable effusion is seen. No bony abnormality is noted. IMPRESSION: No active disease. Electronically Signed   By: Inez Catalina M.D.   On: 08/14/2019 02:57      Shamone Winzer Ginette Otto MD [facility]  If 7PM-7AM, please contact night-coverage   08/14/2019, 3:59 AM

## 2019-08-15 DIAGNOSIS — E1165 Type 2 diabetes mellitus with hyperglycemia: Secondary | ICD-10-CM

## 2019-08-15 DIAGNOSIS — R109 Unspecified abdominal pain: Secondary | ICD-10-CM

## 2019-08-15 DIAGNOSIS — G4730 Sleep apnea, unspecified: Secondary | ICD-10-CM

## 2019-08-15 DIAGNOSIS — G8929 Other chronic pain: Secondary | ICD-10-CM

## 2019-08-15 DIAGNOSIS — Z794 Long term (current) use of insulin: Secondary | ICD-10-CM

## 2019-08-15 LAB — COMPREHENSIVE METABOLIC PANEL
ALT: 15 U/L (ref 0–44)
AST: 11 U/L — ABNORMAL LOW (ref 15–41)
Albumin: 3.1 g/dL — ABNORMAL LOW (ref 3.5–5.0)
Alkaline Phosphatase: 68 U/L (ref 38–126)
Anion gap: 10 (ref 5–15)
BUN: 10 mg/dL (ref 6–20)
CO2: 24 mmol/L (ref 22–32)
Calcium: 8.6 mg/dL — ABNORMAL LOW (ref 8.9–10.3)
Chloride: 102 mmol/L (ref 98–111)
Creatinine, Ser: 0.87 mg/dL (ref 0.44–1.00)
GFR calc Af Amer: >60 mL/min (ref >60)
GFR calc non Af Amer: >60 mL/min (ref >60)
Glucose, Bld: 126 mg/dL — ABNORMAL HIGH (ref 70–99)
Potassium: 3.4 mmol/L — ABNORMAL LOW (ref 3.5–5.1)
Sodium: 136 mmol/L (ref 135–145)
Total Bilirubin: 0.8 mg/dL (ref 0.3–1.2)
Total Protein: 6.9 g/dL (ref 6.5–8.1)

## 2019-08-15 LAB — CBC WITH DIFFERENTIAL/PLATELET
Abs Immature Granulocytes: 0.03 10*3/uL (ref 0.00–0.07)
Basophils Absolute: 0 10*3/uL (ref 0.0–0.1)
Basophils Relative: 0 %
Eosinophils Absolute: 0.2 10*3/uL (ref 0.0–0.5)
Eosinophils Relative: 3 %
HCT: 37.8 % (ref 36.0–46.0)
Hemoglobin: 11.9 g/dL — ABNORMAL LOW (ref 12.0–15.0)
Immature Granulocytes: 1 %
Lymphocytes Relative: 28 %
Lymphs Abs: 1.7 10*3/uL (ref 0.7–4.0)
MCH: 28.5 pg (ref 26.0–34.0)
MCHC: 31.5 g/dL (ref 30.0–36.0)
MCV: 90.6 fL (ref 80.0–100.0)
Monocytes Absolute: 0.4 10*3/uL (ref 0.1–1.0)
Monocytes Relative: 7 %
Neutro Abs: 3.8 10*3/uL (ref 1.7–7.7)
Neutrophils Relative %: 61 %
Platelets: 182 10*3/uL (ref 150–400)
RBC: 4.17 MIL/uL (ref 3.87–5.11)
RDW: 14.6 % (ref 11.5–15.5)
WBC: 6.1 10*3/uL (ref 4.0–10.5)
nRBC: 0 % (ref 0.0–0.2)

## 2019-08-15 LAB — GLUCOSE, CAPILLARY
Glucose-Capillary: 125 mg/dL — ABNORMAL HIGH (ref 70–99)
Glucose-Capillary: 185 mg/dL — ABNORMAL HIGH (ref 70–99)
Glucose-Capillary: 160 mg/dL — ABNORMAL HIGH (ref 70–99)
Glucose-Capillary: 147 mg/dL — ABNORMAL HIGH (ref 70–99)

## 2019-08-15 LAB — MAGNESIUM: Magnesium: 1.9 mg/dL (ref 1.7–2.4)

## 2019-08-15 MED ORDER — KETOROLAC TROMETHAMINE 15 MG/ML IJ SOLN
15.0000 mg | Freq: Once | INTRAMUSCULAR | Status: AC
  Administered 2019-08-15: 23:00:00 15 mg via INTRAVENOUS
  Filled 2019-08-15: qty 1

## 2019-08-15 MED ORDER — SILVER SULFADIAZINE 1 % EX CREA
TOPICAL_CREAM | Freq: Two times a day (BID) | CUTANEOUS | Status: DC
  Filled 2019-08-15: qty 85

## 2019-08-15 MED ORDER — METOCLOPRAMIDE HCL 10 MG PO TABS
10.0000 mg | ORAL_TABLET | Freq: Three times a day (TID) | ORAL | Status: DC | PRN
  Administered 2019-08-15 – 2019-08-16 (×2): 10 mg via ORAL
  Filled 2019-08-15 (×2): qty 1

## 2019-08-15 MED ORDER — POTASSIUM CHLORIDE CRYS ER 20 MEQ PO TBCR
40.0000 meq | EXTENDED_RELEASE_TABLET | Freq: Once | ORAL | Status: AC
  Administered 2019-08-15: 40 meq via ORAL
  Filled 2019-08-15: qty 2

## 2019-08-15 MED ORDER — METOCLOPRAMIDE HCL 10 MG PO TABS: 10.0000 mg | ORAL_TABLET | Freq: Three times a day (TID) | ORAL | Status: DC | PRN

## 2019-08-15 NOTE — Progress Notes (Signed)
Pt stated pain 8/10 in left leg at cellulitis site. Pt stated she is concerned about pills sitting on her stomach, due to chronic condition. Will reassess

## 2019-08-15 NOTE — Progress Notes (Signed)
PROGRESS NOTE [facility]   Brittany [name]  RWE:[id number]  DOB: 05-Aug-1980  DOA: 08/14/2019 PCP: Ledell Noss, [facility]   Brief Admission Hx: 39 y.o. female with medical history significant of Insulin dependent DM, recurrent cellulitis of the left leg, gastroparesis, lymphaedema, GERD, sleep apnea,depression, hypothyroidism who presented to the ER with left lower extremity redness, swelling, warmth, tenderness.  MDM/Assessment & Plan:   1. Sepsis secondary to cellulitis - improved, sepsis physiology resolved, continue to treat cellulitis.  2. Cellulitis LLE - improving on IV cefazolin, continue current management.  She requires IV antibiotics due to large body habitus.  Plan to try to get infection under better control before switching to oral antibiotics.  3. Type 2 DM - continue CBG testing and SSI coverage.  4. Hypothyroidism - resume home levothyroxine.  5. Chronic lymphedema BLEs - unchanged. Places patient at high risk for these recurrent infections.  6. GERD - stable on pepcid.  7. Gastroparesis - resumed metoclopromide.  8. OSA - Pt claims that she is not able to tolerate CPAP.   DVT prophylaxis: lovenox  Code Status: Full  Family Communication:  Patient updated bedside  Disposition Plan: continue IV antibiotics for cellulitis management   Consultants:    Procedures:    Antimicrobials:  Cefazolin IV   Subjective: Pt reports pain and discomfort in left leg   Objective: Vitals:   08/14/19 1319 08/14/19 1956 08/14/19 2114 08/15/19 0521  BP: 106/73  118/86 127/81  Pulse: 98  95 86  Resp: 19  20 20   Temp: 99 F (37.2 C)  98.2 F (36.8 C) 98.2 F (36.8 C)  TempSrc:      SpO2: 95% 95% 95% 96%  Weight:      Height:        Intake/Output Summary (Last 24 hours) at 08/15/2019 0701 Last data filed at 08/15/2019 6195 Gross per 24 hour  Intake 1203.11 ml  Output --  Net 1203.11 ml   Filed Weights   08/14/19 0139  Weight: (!) 249.5 kg      REVIEW OF SYSTEMS  As per history otherwise all reviewed and reported negative  Exam:  General exam: awake, alert, NAD, cooperative.  Morbidly obese female.  Respiratory system: Clear. No increased work of breathing. Cardiovascular system: normal S1 & S2 heard. No JVD, murmurs, gallops, clicks or pedal edema. Gastrointestinal system: Abdomen is nondistended, soft and nontender. Normal bowel sounds heard. Central nervous system: Alert and oriented. No focal neurological deficits. Extremities: intense erythema and heat left lower leg consistent with cellulitis, slightly improved from yesterday's exam.   Data Reviewed: Basic Metabolic Panel: Recent Labs  Lab 08/14/19 0228 08/14/19 0516  NA 133* 134*  K 3.7 3.7  CL 101 102  CO2 23 21*  GLUCOSE 139* 176*  BUN 9 10  CREATININE 0.87 0.91  CALCIUM 8.4* 8.9   Liver Function Tests: Recent Labs  Lab 08/14/19 0228  AST 18  ALT 22  ALKPHOS 84  BILITOT 0.8  PROT 7.9  ALBUMIN 3.5   No results for input(s): LIPASE, AMYLASE in the last 168 hours. No results for input(s): AMMONIA in the last 168 hours. CBC: Recent Labs  Lab 08/14/19 0228 08/14/19 0516 08/15/19 0425  WBC 11.9* 11.6* 6.1  NEUTROABS 10.2*  --  3.8  HGB 13.5 13.5 11.9*  HCT 42.5 42.8 37.8  MCV 88.9 89.4 90.6  PLT 240 258 182   Cardiac Enzymes: No results for input(s): CKTOTAL, CKMB, CKMBINDEX, TROPONINI in the last 168  hours. CBG (last 3)  Recent Labs    08/14/19 1131 08/14/19 1625 08/14/19 2112  GLUCAP 164* 132* 145*   Recent Results (from the past 240 hour(s))  Culture, blood (Routine x 2)     Status: None (Preliminary result)   Collection Time: 08/14/19  2:24 AM   Specimen: BLOOD  Result Value Ref Range Status   Specimen Description BLOOD RIGHT ANTECUBITAL  Final   Special Requests   Final    BOTTLES DRAWN AEROBIC AND ANAEROBIC Blood Culture results may not be optimal due to an inadequate volume of blood received in culture bottles    Culture   Final    NO GROWTH < 12 HOURS Performed at [facility], [address] [place], [place] [address]    Report Status PENDING  Incomplete  Culture, blood (Routine x 2)     Status: None (Preliminary result)   Collection Time: 08/14/19  2:28 AM   Specimen: BLOOD  Result Value Ref Range Status   Specimen Description BLOOD LEFT ANTECUBITAL  Final   Special Requests   Final    BOTTLES DRAWN AEROBIC AND ANAEROBIC Blood Culture adequate volume   Culture   Final    NO GROWTH < 12 HOURS Performed at [facility], [address], [place], [place] [address]    Report Status PENDING  Incomplete  SARS CORONAVIRUS 2 (TAT 6-24 HRS) Nasopharyngeal Nasopharyngeal Swab     Status: None   Collection Time: 08/14/19  2:36 AM   Specimen: Nasopharyngeal Swab  Result Value Ref Range Status   SARS Coronavirus 2 NEGATIVE NEGATIVE Final    Comment: (NOTE) SARS-CoV-2 target nucleic acids are NOT DETECTED. The SARS-CoV-2 RNA is generally detectable in upper and lower respiratory specimens during the acute phase of infection. Negative results do not preclude SARS-CoV-2 infection, do not rule out co-infections with other pathogens, and should not be used as the sole basis for treatment or other patient management decisions. Negative results must be combined with clinical observations, patient history, and epidemiological information. The expected result is Negative. Fact Sheet for Patients: HairSlick.no Fact Sheet for Healthcare Providers: quierodirigir.com This test is not yet approved or cleared by the [place] FDA and  has been authorized for detection and/or diagnosis of SARS-CoV-2 by FDA under an Emergency Use Authorization (EUA). This EUA will remain  in effect (meaning this test can be used) for the duration of the COVID-19 declaration under Section 56 4(b)(1) of the Act, 21 U.S.C. section 360bbb-3(b)(1), unless the authorization is  terminated or revoked sooner. Performed at [facility], [address] [place], [place] [address]      Studies: US Venous Img Lower Unilateral Left (DVT)  Result Date: 08/14/2019 CLINICAL DATA:  Left leg edema EXAM: Left LOWER EXTREMITY VENOUS DOPPLER ULTRASOUND TECHNIQUE: Gray-scale sonography with graded compression, as well as color Doppler and duplex ultrasound were performed to evaluate the lower extremity deep venous systems from the level of the common femoral vein and including the common femoral, femoral, profunda femoral, popliteal and calf veins including the posterior tibial, peroneal and gastrocnemius veins when visible. The superficial great saphenous vein was also interrogated. Spectral Doppler was utilized to evaluate flow at rest and with distal augmentation maneuvers in the common femoral, femoral and popliteal veins. COMPARISON:  10/23/2016 FINDINGS: Contralateral Common Femoral Vein: Respiratory phasicity is normal and symmetric with the symptomatic side. No evidence of thrombus. Normal compressibility. Common Femoral Vein: No evidence of thrombus. Normal compressibility, respiratory phasicity and response  to augmentation. Saphenofemoral Junction: No evidence of thrombus. Normal compressibility and flow on color Doppler imaging. Profunda Femoral Vein: No evidence of thrombus. Normal compressibility and flow on color Doppler imaging. Femoral Vein: No evidence of thrombus. Normal compressibility, respiratory phasicity and response to augmentation. Popliteal Vein: Unable to visualize. Calf Veins: No evidence of thrombus within the left posterior tibial vein. Left peroneal vein not well visualized. Superficial Great Saphenous Vein: No evidence of thrombus. Normal compressibility. IMPRESSION: 1. Nonvisualization of the left popliteal vein and left peroneal vein due to technical factors. 2. No evidence of deep venous thrombosis within the visualized structures of the left lower  extremity. Electronically Signed   By: Sharlet Salina M.D.   On: 08/14/2019 12:35   DG Chest Port 1 View  Result Date: 08/14/2019 CLINICAL DATA:  Fevers EXAM: PORTABLE CHEST 1 VIEW COMPARISON:  08/10/2017 FINDINGS: Cardiac shadow is stable. No focal infiltrate or sizable effusion is seen. No bony abnormality is noted. IMPRESSION: No active disease. Electronically Signed   By: Alcide Clever M.D.   On: 08/14/2019 02:57   Scheduled Meds: . enoxaparin (LOVENOX) injection  120 mg Subcutaneous Q24H  . famotidine  20 mg Oral BID  . insulin aspart  0-15 Units Subcutaneous TID WC  . insulin aspart  0-5 Units Subcutaneous QHS  . insulin glargine  65 Units Subcutaneous QHS  . levothyroxine  50 mcg Oral QAC breakfast  . sodium chloride flush  3 mL Intravenous Q12H   Continuous Infusions: . sodium chloride    .  ceFAZolin (ANCEF) IV 2 g (08/15/19 0533)    Principal Problem:   Cellulitis and abscess of leg Active Problems:   Morbid obesity (HCC)   Sleep apnea   Diabetes mellitus without complication (HCC)   Chronic abdominal pain   GERD (gastroesophageal reflux disease)   Uncontrolled type 2 diabetes mellitus with hyperglycemia, with long-term current use of insulin (HCC)   Hypothyroid   Time spent:   Standley Dakins, MD [facility] 08/15/2019, 7:01 AM    LOS: 1 day  How to contact the [facility] Attending or Consulting provider 7A - 7P or covering provider during after hours 7P -7A, for this patient?  1. Check the care team in [facility] and look for a) attending/consulting [facility] provider listed and b) the [facility] team listed 2. Log into [email or web address] and use [facility]'s universal password to access. If you do not have the password, please contact the hospital operator. 3. Locate the [facility] provider you are looking for under [facility] and page to a number that you can be directly reached. 4. If you still have difficulty reaching the provider, please page the [facility] (Director on Call) for the  Hospitalists listed on amion for assistance.

## 2019-08-16 DIAGNOSIS — E038 Other specified hypothyroidism: Secondary | ICD-10-CM

## 2019-08-16 LAB — CBC WITH DIFFERENTIAL/PLATELET
Abs Immature Granulocytes: 0.03 10*3/uL (ref 0.00–0.07)
Basophils Absolute: 0 10*3/uL (ref 0.0–0.1)
Basophils Relative: 0 %
Eosinophils Absolute: 0.2 10*3/uL (ref 0.0–0.5)
Eosinophils Relative: 3 %
HCT: 37.1 % (ref 36.0–46.0)
Hemoglobin: 11.8 g/dL — ABNORMAL LOW (ref 12.0–15.0)
Immature Granulocytes: 0 %
Lymphocytes Relative: 29 %
Lymphs Abs: 2 10*3/uL (ref 0.7–4.0)
MCH: 28.5 pg (ref 26.0–34.0)
MCHC: 31.8 g/dL (ref 30.0–36.0)
MCV: 89.6 fL (ref 80.0–100.0)
Monocytes Absolute: 0.4 10*3/uL (ref 0.1–1.0)
Monocytes Relative: 6 %
Neutro Abs: 4.1 10*3/uL (ref 1.7–7.7)
Neutrophils Relative %: 62 %
Platelets: 204 10*3/uL (ref 150–400)
RBC: 4.14 MIL/uL (ref 3.87–5.11)
RDW: 14.3 % (ref 11.5–15.5)
WBC: 6.8 10*3/uL (ref 4.0–10.5)
nRBC: 0 % (ref 0.0–0.2)

## 2019-08-16 LAB — MAGNESIUM: Magnesium: 2 mg/dL (ref 1.7–2.4)

## 2019-08-16 LAB — GLUCOSE, CAPILLARY
Glucose-Capillary: 118 mg/dL — ABNORMAL HIGH (ref 70–99)
Glucose-Capillary: 148 mg/dL — ABNORMAL HIGH (ref 70–99)
Glucose-Capillary: 115 mg/dL — ABNORMAL HIGH (ref 70–99)
Glucose-Capillary: 135 mg/dL — ABNORMAL HIGH (ref 70–99)

## 2019-08-16 LAB — COMPREHENSIVE METABOLIC PANEL
ALT: 13 U/L (ref 0–44)
AST: 13 U/L — ABNORMAL LOW (ref 15–41)
Albumin: 3 g/dL — ABNORMAL LOW (ref 3.5–5.0)
Alkaline Phosphatase: 69 U/L (ref 38–126)
Anion gap: 8 (ref 5–15)
BUN: 12 mg/dL (ref 6–20)
CO2: 24 mmol/L (ref 22–32)
Calcium: 8.4 mg/dL — ABNORMAL LOW (ref 8.9–10.3)
Chloride: 104 mmol/L (ref 98–111)
Creatinine, Ser: 0.85 mg/dL (ref 0.44–1.00)
GFR calc Af Amer: >60 mL/min (ref >60)
GFR calc non Af Amer: >60 mL/min (ref >60)
Glucose, Bld: 135 mg/dL — ABNORMAL HIGH (ref 70–99)
Potassium: 3.6 mmol/L (ref 3.5–5.1)
Sodium: 136 mmol/L (ref 135–145)
Total Bilirubin: 0.5 mg/dL (ref 0.3–1.2)
Total Protein: 7 g/dL (ref 6.5–8.1)

## 2019-08-16 MED ORDER — SACCHAROMYCES BOULARDII 250 MG PO CAPS
250.0000 mg | ORAL_CAPSULE | Freq: Two times a day (BID) | ORAL | Status: DC
  Administered 2019-08-16 – 2019-08-17 (×3): 250 mg via ORAL
  Filled 2019-08-16 (×3): qty 1

## 2019-08-16 MED ORDER — CEPHALEXIN 500 MG PO CAPS
1000.0000 mg | ORAL_CAPSULE | Freq: Two times a day (BID) | ORAL | Status: DC
  Administered 2019-08-16 – 2019-08-17 (×2): 1000 mg via ORAL
  Filled 2019-08-16 (×2): qty 2

## 2019-08-16 MED ORDER — SIMETHICONE 80 MG PO CHEW
160.0000 mg | CHEWABLE_TABLET | Freq: Four times a day (QID) | ORAL | Status: DC
  Administered 2019-08-16 – 2019-08-17 (×4): 160 mg via ORAL
  Filled 2019-08-16 (×4): qty 2

## 2019-08-16 NOTE — Care Management (Signed)
Transitioning to oral antibiotic today and anticipating DC tomorrow. Patient is from home with spouse, daughter and mother in law. She has an Mining engineer wheelchair she uses outside of the home, and is able to walk short distances within the home, also has enough room to use electric wheel chair in home if needed. She has medicaid with prescription coverage. No TOC needs.

## 2019-08-16 NOTE — Progress Notes (Signed)
PROGRESS NOTE [facility]   Brittany [name]  SJG:[id number]  DOB: 05/31/1981  DOA: 08/14/2019 PCP: [facility]   Brief Admission Hx: 39 y.o. female with medical history significant of Insulin dependent DM, recurrent cellulitis of the left leg, gastroparesis, lymphaedema, GERD, sleep apnea,depression, hypothyroidism who presented to the ER with left lower extremity redness, swelling, warmth, tenderness.  MDM/Assessment & Plan:   1. Sepsis secondary to cellulitis - RESOLVED. Sepsis physiology resolved, continue to treat cellulitis.  2. Cellulitis LLE - improving on IV cefazolin, transition to oral cephalexin 1000 mg BID with plans to DC tomorrow.   3. Gas - Simethicone ordered.  Probiotics ordered.  4. Type 2 DM - continue CBG testing and SSI coverage.  5. Hypothyroidism - resumed home levothyroxine.  6. Chronic lymphedema BLEs - unchanged. Places patient at high risk for these recurrent infections.  7. GERD - stable on pepcid.  8. Gastroparesis - resumed metoclopromide.  9. OSA - Pt claims that she is not able to tolerate CPAP.   DVT prophylaxis: lovenox  Code Status: Full  Family Communication:  Patient updated bedside  Disposition Plan: transition to oral antibiotics and home tomorrow    Consultants:    Procedures:    Antimicrobials:  Cefazolin IV   Subjective: Pt stomach cramps and excess flatus.   Objective: Vitals:   08/15/19 1340 08/15/19 1938 08/15/19 2125 08/16/19 0528  BP: 103/81  (!) 113/45 114/73  Pulse: 94  88 81  Resp: 18  20 16   Temp: 98.9 F (37.2 C)  98 F (36.7 C) (!) 97.5 F (36.4 C)  TempSrc:    Oral  SpO2: 94% 94% 96% 94%  Weight:      Height:        Intake/Output Summary (Last 24 hours) at 08/16/2019 1016 Last data filed at 08/16/2019 0200 Gross per 24 hour  Intake 958.02 ml  Output --  Net 958.02 ml   Filed Weights   08/14/19 0139  Weight: (!) 249.5 kg    REVIEW OF SYSTEMS  As per history otherwise all  reviewed and reported negative  Exam:  General exam: awake, alert, NAD, cooperative.  Morbidly obese female.  Respiratory system: Clear. No increased work of breathing. Cardiovascular system: normal S1 & S2 heard. No JVD, murmurs, gallops, clicks or pedal edema. Gastrointestinal system: Abdomen is nondistended, soft and nontender, hyperactive BS.  Central nervous system: Alert and oriented. No focal neurological deficits. Extremities: intense erythema and heat left lower leg mostly resolved.   Data Reviewed: Basic Metabolic Panel: Recent Labs  Lab 08/14/19 0228 08/14/19 0516 08/15/19 0425 08/16/19 0607  NA 133* 134* 136 136  K 3.7 3.7 3.4* 3.6  CL 101 102 102 104  CO2 23 21* 24 24  GLUCOSE 139* 176* 126* 135*  BUN 9 10 10 12   CREATININE 0.87 0.91 0.87 0.85  CALCIUM 8.4* 8.9 8.6* 8.4*  MG  --   --  1.9 2.0   Liver Function Tests: Recent Labs  Lab 08/14/19 0228 08/15/19 0425 08/16/19 0607  AST 18 11* 13*  ALT 22 15 13   ALKPHOS 84 68 69  BILITOT 0.8 0.8 0.5  PROT 7.9 6.9 7.0  ALBUMIN 3.5 3.1* 3.0*   No results for input(s): LIPASE, AMYLASE in the last 168 hours. No results for input(s): AMMONIA in the last 168 hours. CBC: Recent Labs  Lab 08/14/19 0228 08/14/19 0516 08/15/19 0425 08/16/19 0607  WBC 11.9* 11.6* 6.1 6.8  NEUTROABS 10.2*  --  3.8 4.1  HGB 13.5 13.5 11.9* 11.8*  HCT 42.5 42.8 37.8 37.1  MCV 88.9 89.4 90.6 89.6  PLT 240 258 182 204   Cardiac Enzymes: No results for input(s): CKTOTAL, CKMB, CKMBINDEX, TROPONINI in the last 168 hours. CBG (last 3)  Recent Labs    08/15/19 1627 08/15/19 2127 08/16/19 0750  GLUCAP 160* 147* 118*   Recent Results (from the past 240 hour(s))  Culture, blood (Routine x 2)     Status: None (Preliminary result)   Collection Time: 08/14/19  2:24 AM   Specimen: BLOOD  Result Value Ref Range Status   Specimen Description BLOOD RIGHT ANTECUBITAL  Final   Special Requests   Final    BOTTLES DRAWN AEROBIC AND  ANAEROBIC Blood Culture results may not be optimal due to an inadequate volume of blood received in culture bottles   Culture   Final    NO GROWTH 2 DAYS Performed at [facility], [address], [place], [place] [address]    Report Status PENDING  Incomplete  Culture, blood (Routine x 2)     Status: None (Preliminary result)   Collection Time: 08/14/19  2:28 AM   Specimen: BLOOD  Result Value Ref Range Status   Specimen Description BLOOD LEFT ANTECUBITAL  Final   Special Requests   Final    BOTTLES DRAWN AEROBIC AND ANAEROBIC Blood Culture adequate volume   Culture   Final    NO GROWTH 2 DAYS Performed at [facility], [address], [place], [place] [address]    Report Status PENDING  Incomplete  SARS CORONAVIRUS 2 (TAT 6-24 HRS) Nasopharyngeal Nasopharyngeal Swab     Status: None   Collection Time: 08/14/19  2:36 AM   Specimen: Nasopharyngeal Swab  Result Value Ref Range Status   SARS Coronavirus 2 NEGATIVE NEGATIVE Final    Comment: (NOTE) SARS-CoV-2 target nucleic acids are NOT DETECTED. The SARS-CoV-2 RNA is generally detectable in upper and lower respiratory specimens during the acute phase of infection. Negative results do not preclude SARS-CoV-2 infection, do not rule out co-infections with other pathogens, and should not be used as the sole basis for treatment or other patient management decisions. Negative results must be combined with clinical observations, patient history, and epidemiological information. The expected result is Negative. Fact Sheet for Patients: HairSlick.no Fact Sheet for Healthcare Providers: quierodirigir.com This test is not yet approved or cleared by the [place] FDA and  has been authorized for detection and/or diagnosis of SARS-CoV-2 by FDA under an Emergency Use Authorization (EUA). This EUA will remain  in effect (meaning this test can be used) for the duration of the COVID-19  declaration under Section 56 4(b)(1) of the Act, 21 U.S.C. section 360bbb-3(b)(1), unless the authorization is terminated or revoked sooner. Performed at [facility], [address] [place], [place] [address]      Studies: US Venous Img Lower Unilateral Left (DVT)  Result Date: 08/14/2019 CLINICAL DATA:  Left leg edema EXAM: Left LOWER EXTREMITY VENOUS DOPPLER ULTRASOUND TECHNIQUE: Gray-scale sonography with graded compression, as well as color Doppler and duplex ultrasound were performed to evaluate the lower extremity deep venous systems from the level of the common femoral vein and including the common femoral, femoral, profunda femoral, popliteal and calf veins including the posterior tibial, peroneal and gastrocnemius veins when visible. The superficial great saphenous vein was also interrogated. Spectral Doppler was utilized to evaluate flow at rest and with distal augmentation maneuvers in the common femoral, femoral  and popliteal veins. COMPARISON:  10/23/2016 FINDINGS: Contralateral Common Femoral Vein: Respiratory phasicity is normal and symmetric with the symptomatic side. No evidence of thrombus. Normal compressibility. Common Femoral Vein: No evidence of thrombus. Normal compressibility, respiratory phasicity and response to augmentation. Saphenofemoral Junction: No evidence of thrombus. Normal compressibility and flow on color Doppler imaging. Profunda Femoral Vein: No evidence of thrombus. Normal compressibility and flow on color Doppler imaging. Femoral Vein: No evidence of thrombus. Normal compressibility, respiratory phasicity and response to augmentation. Popliteal Vein: Unable to visualize. Calf Veins: No evidence of thrombus within the left posterior tibial vein. Left peroneal vein not well visualized. Superficial Great Saphenous Vein: No evidence of thrombus. Normal compressibility. IMPRESSION: 1. Nonvisualization of the left popliteal vein and left peroneal vein due to  technical factors. 2. No evidence of deep venous thrombosis within the visualized structures of the left lower extremity. Electronically Signed   By: Sharlet Salina M.D.   On: 08/14/2019 12:35   Scheduled Meds:  cephALEXin  1,000 mg Oral Q12H   enoxaparin (LOVENOX) injection  120 mg Subcutaneous Q24H   famotidine  20 mg Oral BID   insulin aspart  0-15 Units Subcutaneous TID WC   insulin aspart  0-5 Units Subcutaneous QHS   insulin glargine  65 Units Subcutaneous QHS   levothyroxine  50 mcg Oral QAC breakfast   saccharomyces boulardii  250 mg Oral BID   silver sulfADIAZINE   Topical BID   simethicone  160 mg Oral QID   sodium chloride flush  3 mL Intravenous Q12H   Continuous Infusions:  sodium chloride 250 mL (08/15/19 1401)    Principal Problem:   Cellulitis and abscess of leg Active Problems:   Morbid obesity (HCC)   Sleep apnea   Diabetes mellitus without complication (HCC)   Chronic abdominal pain   GERD (gastroesophageal reflux disease)   Uncontrolled type 2 diabetes mellitus with hyperglycemia, with long-term current use of insulin (HCC)   Hypothyroid   Time spent:   Standley Dakins, MD [facility] 08/16/2019, 10:16 AM    LOS: 2 days  How to contact the [facility] Attending or Consulting provider 7A - 7P or covering provider during after hours 7P -7A, for this patient?  1. Check the care team in [facility] and look for a) attending/consulting [facility] provider listed and b) the [facility] team listed 2. Log into [email or web address] and use Jette's universal password to access. If you do not have the password, please contact the hospital operator. 3. Locate the [facility] provider you are looking for under [facility] and page to a number that you can be directly reached. 4. If you still have difficulty reaching the provider, please page the [facility] (Director on Call) for the Hospitalists listed on amion for assistance.

## 2019-08-17 LAB — CBC WITH DIFFERENTIAL/PLATELET
Abs Immature Granulocytes: 0.04 10*3/uL (ref 0.00–0.07)
Basophils Absolute: 0 10*3/uL (ref 0.0–0.1)
Basophils Relative: 0 %
Eosinophils Absolute: 0.2 10*3/uL (ref 0.0–0.5)
Eosinophils Relative: 3 %
HCT: 41.2 % (ref 36.0–46.0)
Hemoglobin: 12.6 g/dL (ref 12.0–15.0)
Immature Granulocytes: 1 %
Lymphocytes Relative: 23 %
Lymphs Abs: 1.6 10*3/uL (ref 0.7–4.0)
MCH: 27.9 pg (ref 26.0–34.0)
MCHC: 30.6 g/dL (ref 30.0–36.0)
MCV: 91.2 fL (ref 80.0–100.0)
Monocytes Absolute: 0.3 10*3/uL (ref 0.1–1.0)
Monocytes Relative: 5 %
Neutro Abs: 4.7 10*3/uL (ref 1.7–7.7)
Neutrophils Relative %: 68 %
Platelets: 230 10*3/uL (ref 150–400)
RBC: 4.52 MIL/uL (ref 3.87–5.11)
RDW: 14.3 % (ref 11.5–15.5)
WBC: 6.8 10*3/uL (ref 4.0–10.5)
nRBC: 0 % (ref 0.0–0.2)

## 2019-08-17 LAB — COMPREHENSIVE METABOLIC PANEL
ALT: 17 U/L (ref 0–44)
AST: 20 U/L (ref 15–41)
Albumin: 3.3 g/dL — ABNORMAL LOW (ref 3.5–5.0)
Alkaline Phosphatase: 80 U/L (ref 38–126)
Anion gap: 8 (ref 5–15)
BUN: 11 mg/dL (ref 6–20)
CO2: 27 mmol/L (ref 22–32)
Calcium: 8.9 mg/dL (ref 8.9–10.3)
Chloride: 103 mmol/L (ref 98–111)
Creatinine, Ser: 0.84 mg/dL (ref 0.44–1.00)
GFR calc Af Amer: >60 mL/min (ref >60)
GFR calc non Af Amer: >60 mL/min (ref >60)
Glucose, Bld: 127 mg/dL — ABNORMAL HIGH (ref 70–99)
Potassium: 4 mmol/L (ref 3.5–5.1)
Sodium: 138 mmol/L (ref 135–145)
Total Bilirubin: 0.6 mg/dL (ref 0.3–1.2)
Total Protein: 7.7 g/dL (ref 6.5–8.1)

## 2019-08-17 LAB — GLUCOSE, CAPILLARY
Glucose-Capillary: 133 mg/dL — ABNORMAL HIGH (ref 70–99)
Glucose-Capillary: 147 mg/dL — ABNORMAL HIGH (ref 70–99)

## 2019-08-17 LAB — MAGNESIUM: Magnesium: 2 mg/dL (ref 1.7–2.4)

## 2019-08-17 MED ORDER — CEPHALEXIN 500 MG PO CAPS: 1000.0000 mg | ORAL_CAPSULE | Freq: Two times a day (BID) | ORAL | 0 refills | Status: AC

## 2019-08-17 MED ORDER — SACCHAROMYCES BOULARDII 250 MG PO CAPS: 250.0000 mg | ORAL_CAPSULE | Freq: Two times a day (BID) | ORAL | 0 refills | Status: AC

## 2019-08-17 NOTE — Discharge Instructions (Signed)
Cellulitis, Adult  Cellulitis is a skin infection. The infected area is often warm, red, swollen, and sore. It occurs most often in the arms and lower legs. It is very important to get treated for this condition. What are the causes? This condition is caused by bacteria. The bacteria enter through a break in the skin, such as a cut, burn, insect bite, open sore, or crack. What increases the risk? This condition is more likely to occur in people who:  Have a weak body defense system (immune system).  Have open cuts, burns, bites, or scrapes on the skin.  Are older than 41 years of age.  Have a blood sugar problem (diabetes).  Have a long-lasting (chronic) liver disease (cirrhosis) or kidney disease.  Are very overweight (obese).  Have a skin problem, such as: ? Itchy rash (eczema). ? Slow movement of blood in the veins (venous stasis). ? Fluid buildup below the skin (edema).  Have been treated with high-energy rays (radiation).  Use IV drugs. What are the signs or symptoms? Symptoms of this condition include:  Skin that is: ? Red. ? Streaking. ? Spotting. ? Swollen. ? Sore or painful when you touch it. ? Warm.  A fever.  Chills.  Blisters. How is this diagnosed? This condition is diagnosed based on:  Medical history.  Physical exam.  Blood tests.  Imaging tests. How is this treated? Treatment for this condition may include:  Medicines to treat infections or allergies.  Home care, such as: ? Rest. ? Placing cold or warm cloths (compresses) on the skin.  Hospital care, if the condition is very bad. Follow these instructions at home: Medicines  Take over-the-counter and prescription medicines only as told by your doctor.  If you were prescribed an antibiotic medicine, take it as told by your doctor. Do not stop taking it even if you start to feel better. General instructions   Drink enough fluid to keep your pee (urine) pale yellow.  Do not touch  or rub the infected area.  Raise (elevate) the infected area above the level of your heart while you are sitting or lying down.  Place cold or warm cloths on the area as told by your doctor.  Keep all follow-up visits as told by your doctor. This is important. Contact a doctor if:  You have a fever.  You do not start to get better after 1-2 days of treatment.  Your bone or joint under the infected area starts to hurt after the skin has healed.  Your infection comes back. This can happen in the same area or another area.  You have a swollen bump in the area.  You have new symptoms.  You feel ill and have muscle aches and pains. Get help right away if:  Your symptoms get worse.  You feel very sleepy.  You throw up (vomit) or have watery poop (diarrhea) for a long time.  You see red streaks coming from the area.  Your red area gets larger.  Your red area turns dark in color. These symptoms may represent a serious problem that is an emergency. Do not wait to see if the symptoms will go away. Get medical help right away. Call your local emergency services (911 in the [place] Do not drive yourself to the hospital. Summary  Cellulitis is a skin infection. The area is often warm, red, swollen, and sore.  This condition is treated with medicines, rest, and cold and warm cloths.  Take all medicines only   as told by your doctor.  Tell your doctor if symptoms do not start to get better after 1-2 days of treatment. This information is not intended to replace advice given to you by your health care provider. Make sure you discuss any questions you have with your health care provider. Document Revised: 11/16/2017 Document Reviewed: 11/16/2017 Elsevier Patient Education  2020 [employer]   IMPORTANT INFORMATION: PAY CLOSE ATTENTION   PHYSICIAN DISCHARGE INSTRUCTIONS  Follow with Primary care provider  [place]  and other consultants as instructed by your  Hospitalist Physician  SEEK MEDICAL CARE OR RETURN TO EMERGENCY ROOM IF SYMPTOMS COME BACK, WORSEN OR NEW PROBLEM DEVELOPS   Please note: You were cared for by a hospitalist during your hospital stay. Every effort will be made to forward records to your primary care provider.  You can request that your primary care provider send for your hospital records if they have not received them.  Once you are discharged, your primary care physician will handle any further medical issues. Please note that NO REFILLS for any discharge medications will be authorized once you are discharged, as it is imperative that you return to your primary care physician (or establish a relationship with a primary care physician if you do not have one) for your post hospital discharge needs so that they can reassess your need for medications and monitor your lab values.  Please get a complete blood count and chemistry panel checked by your Primary MD at your next visit, and again as instructed by your Primary MD.  Get Medicines reviewed and adjusted: Please take all your medications with you for your next visit with your Primary MD  Laboratory/radiological data: Please request your Primary MD to go over all hospital tests and procedure/radiological results at the follow up, please ask your primary care provider to get all Hospital records sent to his/her office.  In some cases, they will be blood work, cultures and biopsy results pending at the time of your discharge. Please request that your primary care provider follow up on these results.  If you are diabetic, please bring your blood sugar readings with you to your follow up appointment with primary care.    Please call and make your follow up appointments as soon as possible.    Also Note the following: If you experience worsening of your admission symptoms, develop shortness of breath, life threatening emergency, suicidal or homicidal thoughts you must seek medical  attention immediately by calling 911 or calling your MD immediately  if symptoms less severe.  You must read complete instructions/literature along with all the possible adverse reactions/side effects for all the Medicines you take and that have been prescribed to you. Take any new Medicines after you have completely understood and accpet all the possible adverse reactions/side effects.   Do not drive when taking Pain medications or sleeping medications (Benzodiazepines)  Do not take more than prescribed Pain, Sleep and Anxiety Medications. It is not advisable to combine anxiety,sleep and pain medications without talking with your primary care practitioner  Special Instructions: If you have smoked or chewed Tobacco  in the last 2 yrs please stop smoking, stop any regular Alcohol  and or any Recreational drug use.  Wear Seat belts while driving.  Do not drive if taking any narcotic, mind altering or controlled substances or recreational drugs or alcohol.

## 2019-08-17 NOTE — Progress Notes (Signed)
Discharge instructions given to patient Pt verbalized understanding of meds and followup appointments. IV removed form rt arm with cath intact. No s/s of any distress

## 2019-08-17 NOTE — Discharge Summary (Signed)
Physician Discharge Summary  Brittany [name] ZYS:[id number] DOB: 23-Mar-1981 DOA: 08/14/2019  PCP: [name]  Admit date: 08/14/2019 Discharge date: 08/17/2019  Admitted From: HOME  Disposition:  HOME   Recommendations for Outpatient Follow-up:  1. Follow up with PCP in 2 weeks  Discharge Condition: STABLE   CODE STATUS: HOME    Brief Hospitalization Summary: Please see all hospital notes, images, labs for full details of the hospitalization. ADMISSION HPI: Brittany [name] is a 39 y.o. female with medical history significant of Insulin dependent DM, recurrent cellulitis of the left leg, gastroparesis, lymphaedema, GERD, sleep apnea,depression, hypothyroidism who presented to the ER with left lower extremity redness, swelling, warmth, tenderness. Has chronic lymphedema and has frequent episodes of cellulitis.  Most recent episode was in September 2019.  Has chronic nausea secondary to gastroparesis.  No change in abdominal symptoms.  No chest pain, shortness of breath, diarrhea, insect bite, sore throat, dysuria. ED Course:  Vital Signs reviewed on presentation, significant for temperature 101, heart rate 117, blood pressure 155/103, saturation 96% on room air. Labs reviewed, significant for sodium 133, potassium 3.7, chloride 101, BUN 9, creatinine 0.8, LFTs within normal meds, lactic acid 1.6, WBC count 11.9, hemoglobin 13.5, hematocrit 40, platelets 240.  INR 1.1.  Blood cultures have been sent.  SARS Covid RT-PCR pending. Imaging personally Reviewed, chest x-ray shows no acute cardiopulmonary disease. EKG personally reviewed, shows sinus tachycardia, no acute ST-T changes.  Brief Admission Hx: 39 y.o.femalewith medical history significant ofInsulin dependent DM, recurrent cellulitis of the left leg, gastroparesis, lymphaedema, GERD, sleep apnea,depression, hypothyroidismwho presented to the ER with left lower extremity redness, swelling, warmth,  tenderness.  MDM/Assessment & Plan:   1. Sepsis secondary to cellulitis - RESOLVED. Sepsis physiology resolved, continue to treat cellulitis.  2. Cellulitis LLE - greatly improved on IV cefazolin, transitioned to oral cephalexin 1000 mg BID and continues to improve.  DC home.    3. Gas - Simethicone ordered.  Probiotics ordered.  Symptoms better today.  4. Type 2 DM - well controlled, resume home regimen. continue CBG testing and home SSI coverage.  5. Hypothyroidism - Pt says that she does not take levothyroxine any longer and has refused to take.  Follow up with PCP.  6. Chronic lymphedema BLEs - slightly improved. Places patient at high risk for these recurrent infections.  7. GERD - stable on pepcid.  8. Gastroparesis - resumed metoclopromide.  9. OSA - Pt claims that she is not able to tolerate CPAP.   DVT prophylaxis: lovenox  Code Status: Full  Family Communication:  Patient updated bedside  Disposition Plan: Home   Consultants:    Procedures:    Antimicrobials:  Cefazolin IV   Cephalexin oral   Discharge Diagnoses:  Principal Problem:   Cellulitis and abscess of leg Active Problems:   Morbid obesity (HCC)   Sleep apnea   Diabetes mellitus without complication (HCC)   Chronic abdominal pain   GERD (gastroesophageal reflux disease)   Uncontrolled type 2 diabetes mellitus with hyperglycemia, with long-term current use of insulin (HCC)   Hypothyroid   Discharge Instructions:  Allergies as of 08/17/2019      Reactions   Paxil [paroxetine Hcl]    Suicidal thoughts   Morphine And Related Other (See Comments)   "it just freaks me out"   Latex Itching, Swelling, Rash   Sulfa Antibiotics Nausea And Vomiting, Rash      Medication List    STOP taking these medications  levothyroxine 50 MCG tablet Commonly known as: Synthroid     TAKE these medications   cephALEXin 500 MG capsule Commonly known as: KEFLEX Take 2 capsules (1,000 mg total) by mouth  every 12 (twelve) hours for 5 days.   famotidine 20 MG tablet Commonly known as: PEPCID Take 1 tablet (20 mg total) by mouth 2 (two) times daily.   insulin aspart 100 UNIT/ML injection Commonly known as: novoLOG SLIDING SCALE 3 TIMES DAILY WITH MEALS: For blood sugar 70-120-no insulin. Blood sugar 121-150 take 5 units. Blood sugar 151-208 take 8 units. Blood sugar 201-250 take 12 units. Blood sugar 251-300 take 17 units. Blood sugar 301-350 take 20 units. Blood sugar 351-400 take 25 units. Blood sugar greater than 400 take 28 units and call your doctor.   insulin glargine 100 UNIT/ML injection Commonly known as: LANTUS Inject 0.65 mLs (65 Units total) into the skin at bedtime.   metoCLOPramide 10 MG tablet Commonly known as: REGLAN Take 10 mg by mouth 3 (three) times daily.   ondansetron 4 MG tablet Commonly known as: ZOFRAN Take 4 mg by mouth every 8 (eight) hours as needed.   saccharomyces boulardii 250 MG capsule Commonly known as: FLORASTOR Take 1 capsule (250 mg total) by mouth 2 (two) times daily for 7 days.   TYLENOL 500 MG tablet Generic drug: acetaminophen Take 1,000 mg by mouth every 6 (six) hours as needed for mild pain.   Victoza 18 MG/3ML Sopn Generic drug: liraglutide Inject 0.2 mLs into the skin daily.      Follow-up Information    [place]. Schedule an appointment as soon as possible for a visit in 2 week(s).   Specialty: Internal Medicine Contact information: [address] Raeanne G[place]s [place] [address] [phone]          Allergies  Allergen Reactions  . Paxil [Paroxetine Hcl]     Suicidal thoughts  . Morphine And Related Other (See Comments)    "it just freaks me out"  . Latex Itching, Swelling and Rash  . Sulfa Antibiotics Nausea And Vomiting and Rash   Allergies as of 08/17/2019      Reactions   Paxil [paroxetine Hcl]    Suicidal thoughts   Morphine And Related Other (See Comments)   "it just freaks me out"   Latex  Itching, Swelling, Rash   Sulfa Antibiotics Nausea And Vomiting, Rash      Medication List    STOP taking these medications   levothyroxine 50 MCG tablet Commonly known as: Synthroid     TAKE these medications   cephALEXin 500 MG capsule Commonly known as: KEFLEX Take 2 capsules (1,000 mg total) by mouth every 12 (twelve) hours for 5 days.   famotidine 20 MG tablet Commonly known as: PEPCID Take 1 tablet (20 mg total) by mouth 2 (two) times daily.   insulin aspart 100 UNIT/ML injection Commonly known as: novoLOG SLIDING SCALE 3 TIMES DAILY WITH MEALS: For blood sugar 70-120-no insulin. Blood sugar 121-150 take 5 units. Blood sugar 151-208 take 8 units. Blood sugar 201-250 take 12 units. Blood sugar 251-300 take 17 units. Blood sugar 301-350 take 20 units. Blood sugar 351-400 take 25 units. Blood sugar greater than 400 take 28 units and call your doctor.   insulin glargine 100 UNIT/ML injection Commonly known as: LANTUS Inject 0.65 mLs (65 Units total) into the skin at bedtime.   metoCLOPramide 10 MG tablet Commonly known as: REGLAN Take 10 mg by mouth 3 (three)  times daily.   ondansetron 4 MG tablet Commonly known as: ZOFRAN Take 4 mg by mouth every 8 (eight) hours as needed.   saccharomyces boulardii 250 MG capsule Commonly known as: FLORASTOR Take 1 capsule (250 mg total) by mouth 2 (two) times daily for 7 days.   TYLENOL 500 MG tablet Generic drug: acetaminophen Take 1,000 mg by mouth every 6 (six) hours as needed for mild pain.   Victoza 18 MG/3ML Sopn Generic drug: liraglutide Inject 0.2 mLs into the skin daily.       Procedures/Studies: US Venous Img Lower Unilateral Left (DVT)  Result Date: 08/14/2019 CLINICAL DATA:  Left leg edema EXAM: Left LOWER EXTREMITY VENOUS DOPPLER ULTRASOUND TECHNIQUE: Gray-scale sonography with graded compression, as well as color Doppler and duplex ultrasound were performed to evaluate the lower extremity deep venous systems from  the level of the common femoral vein and including the common femoral, femoral, profunda femoral, popliteal and calf veins including the posterior tibial, peroneal and gastrocnemius veins when visible. The superficial great saphenous vein was also interrogated. Spectral Doppler was utilized to evaluate flow at rest and with distal augmentation maneuvers in the common femoral, femoral and popliteal veins. COMPARISON:  10/23/2016 FINDINGS: Contralateral Common Femoral Vein: Respiratory phasicity is normal and symmetric with the symptomatic side. No evidence of thrombus. Normal compressibility. Common Femoral Vein: No evidence of thrombus. Normal compressibility, respiratory phasicity and response to augmentation. Saphenofemoral Junction: No evidence of thrombus. Normal compressibility and flow on color Doppler imaging. Profunda Femoral Vein: No evidence of thrombus. Normal compressibility and flow on color Doppler imaging. Femoral Vein: No evidence of thrombus. Normal compressibility, respiratory phasicity and response to augmentation. Popliteal Vein: Unable to visualize. Calf Veins: No evidence of thrombus within the left posterior tibial vein. Left peroneal vein not well visualized. Superficial Great Saphenous Vein: No evidence of thrombus. Normal compressibility. IMPRESSION: 1. Nonvisualization of the left popliteal vein and left peroneal vein due to technical factors. 2. No evidence of deep venous thrombosis within the visualized structures of the left lower extremity. Electronically Signed   By: Randa Ngo M.D.   On: 08/14/2019 12:35   DG Chest Port 1 View  Result Date: 08/14/2019 CLINICAL DATA:  Fevers EXAM: PORTABLE CHEST 1 VIEW COMPARISON:  08/10/2017 FINDINGS: Cardiac shadow is stable. No focal infiltrate or sizable effusion is seen. No bony abnormality is noted. IMPRESSION: No active disease. Electronically Signed   By: Inez Catalina M.D.   On: 08/14/2019 02:57      Subjective: Pt reports swelling  and pain in left leg much improved, infection seems much better.   Discharge Exam: Vitals:   08/17/19 0547 08/17/19 0752  BP: 131/62   Pulse: 88   Resp: 20   Temp: 98 F (36.7 C)   SpO2: 95% 96%   Vitals:   08/16/19 2120 08/17/19 0500 08/17/19 0547 08/17/19 0752  BP: 120/79  131/62   Pulse: 78  88   Resp: 20  20   Temp: 98.1 F (36.7 C)  98 F (36.7 C)   TempSrc: Oral  Oral   SpO2: 100%  95% 96%  Weight:  (!) 242.5 kg    Height:       General exam: awake, alert, NAD, cooperative.  Morbidly obese female.  Respiratory system: Clear. No increased work of breathing. Cardiovascular system: normal S1 & S2 heard. No JVD, murmurs, gallops, clicks or pedal edema. Gastrointestinal system: Abdomen is nondistended, soft and nontender, hyperactive BS.  Central nervous system: Alert and  oriented. No focal neurological deficits. Extremities:  erythema and heat left lower leg resolved.    The results of significant diagnostics from this hospitalization (including imaging, microbiology, ancillary and laboratory) are listed below for reference.     Microbiology: Recent Results (from the past 240 hour(s))  Culture, blood (Routine x 2)     Status: None (Preliminary result)   Collection Time: 08/14/19  2:24 AM   Specimen: BLOOD  Result Value Ref Range Status   Specimen Description BLOOD RIGHT ANTECUBITAL  Final   Special Requests   Final    BOTTLES DRAWN AEROBIC AND ANAEROBIC Blood Culture results may not be optimal due to an inadequate volume of blood received in culture bottles   Culture   Final    NO GROWTH 3 DAYS Performed at [facility], [address] [place], [place] [address]    Report Status PENDING  Incomplete  Culture, blood (Routine x 2)     Status: None (Preliminary result)   Collection Time: 08/14/19  2:28 AM   Specimen: BLOOD  Result Value Ref Range Status   Specimen Description BLOOD LEFT ANTECUBITAL  Final   Special Requests   Final    BOTTLES DRAWN AEROBIC AND  ANAEROBIC Blood Culture adequate volume   Culture   Final    NO GROWTH 3 DAYS Performed at [facility], [address] [place], [place] [address]    Report Status PENDING  Incomplete  SARS CORONAVIRUS 2 (TAT 6-24 HRS) Nasopharyngeal Nasopharyngeal Swab     Status: None   Collection Time: 08/14/19  2:36 AM   Specimen: Nasopharyngeal Swab  Result Value Ref Range Status   SARS Coronavirus 2 NEGATIVE NEGATIVE Final    Comment: (NOTE) SARS-CoV-2 target nucleic acids are NOT DETECTED. The SARS-CoV-2 RNA is generally detectable in upper and lower respiratory specimens during the acute phase of infection. Negative results do not preclude SARS-CoV-2 infection, do not rule out co-infections with other pathogens, and should not be used as the sole basis for treatment or other patient management decisions. Negative results must be combined with clinical observations, patient history, and epidemiological information. The expected result is Negative. Fact Sheet for Patients: HairSlick.nohttps://www.fda.gov/media/138098/download Fact Sheet for Healthcare Providers: quierodirigir.comhttps://www.fda.gov/media/138095/download This test is not yet approved or cleared by the [place] FDA and  has been authorized for detection and/or diagnosis of SARS-CoV-2 by FDA under an Emergency Use Authorization (EUA). This EUA will remain  in effect (meaning this test can be used) for the duration of the COVID-19 declaration under Section 56 4(b)(1) of the Act, 21 U.S.C. section 360bbb-3(b)(1), unless the authorization is terminated or revoked sooner. Performed at [facility], [address] [place], [place] [address]      Labs: BNP (last 3 results) No results for input(s): BNP in the last 8760 hours. Basic Metabolic Panel: Recent Labs  Lab 08/14/19 0228 08/14/19 0516 08/15/19 0425 08/16/19 0607 08/17/19 0616  NA 133* 134* 136 136 138  K 3.7 3.7 3.4* 3.6 4.0  CL 101 102 102 104 103  CO2 23 21* 24 24 27   GLUCOSE  139* 176* 126* 135* 127*  BUN 9 10 10 12 11   CREATININE 0.87 0.91 0.87 0.85 0.84  CALCIUM 8.4* 8.9 8.6* 8.4* 8.9  MG  --   --  1.9 2.0 2.0   Liver Function Tests: Recent Labs  Lab 08/14/19 0228 08/15/19 0425 08/16/19 0607 08/17/19 0616  AST 18 11* 13* 20  ALT 22 15 13 17   ALKPHOS  84 68 69 80  BILITOT 0.8 0.8 0.5 0.6  PROT 7.9 6.9 7.0 7.7  ALBUMIN 3.5 3.1* 3.0* 3.3*   No results for input(s): LIPASE, AMYLASE in the last 168 hours. No results for input(s): AMMONIA in the last 168 hours. CBC: Recent Labs  Lab 08/14/19 0228 08/14/19 0516 08/15/19 0425 08/16/19 0607 08/17/19 0616  WBC 11.9* 11.6* 6.1 6.8 6.8  NEUTROABS 10.2*  --  3.8 4.1 4.7  HGB 13.5 13.5 11.9* 11.8* 12.6  HCT 42.5 42.8 37.8 37.1 41.2  MCV 88.9 89.4 90.6 89.6 91.2  PLT 240 258 182 204 230   Cardiac Enzymes: No results for input(s): CKTOTAL, CKMB, CKMBINDEX, TROPONINI in the last 168 hours. BNP: Invalid input(s): POCBNP CBG: Recent Labs  Lab 08/16/19 0750 08/16/19 1200 08/16/19 1707 08/16/19 2118 08/17/19 0722  GLUCAP 118* 148* 115* 135* 133*   D-Dimer No results for input(s): DDIMER in the last 72 hours. Hgb A1c No results for input(s): HGBA1C in the last 72 hours. Lipid Profile No results for input(s): CHOL, HDL, LDLCALC, TRIG, CHOLHDL, LDLDIRECT in the last 72 hours. Thyroid function studies No results for input(s): TSH, T4TOTAL, T3FREE, THYROIDAB in the last 72 hours.  Invalid input(s): FREET3 Anemia work up No results for input(s): VITAMINB12, FOLATE, FERRITIN, TIBC, IRON, RETICCTPCT in the last 72 hours. Urinalysis    Component Value Date/Time   COLORURINE YELLOW 08/14/2019 0419   APPEARANCEUR HAZY (A) 08/14/2019 0419   LABSPEC 1.023 08/14/2019 0419   PHURINE 5.0 08/14/2019 0419   GLUCOSEU NEGATIVE 08/14/2019 0419   HGBUR LARGE (A) 08/14/2019 0419   BILIRUBINUR NEGATIVE 08/14/2019 0419   KETONESUR NEGATIVE 08/14/2019 0419   PROTEINUR NEGATIVE 08/14/2019 0419   UROBILINOGEN  0.2 02/22/2013 0414   NITRITE NEGATIVE 08/14/2019 0419   LEUKOCYTESUR LARGE (A) 08/14/2019 0419   Sepsis Labs Invalid input(s): PROCALCITONIN,  WBC,  LACTICIDVEN Microbiology Recent Results (from the past 240 hour(s))  Culture, blood (Routine x 2)     Status: None (Preliminary result)   Collection Time: 08/14/19  2:24 AM   Specimen: BLOOD  Result Value Ref Range Status   Specimen Description BLOOD RIGHT ANTECUBITAL  Final   Special Requests   Final    BOTTLES DRAWN AEROBIC AND ANAEROBIC Blood Culture results may not be optimal due to an inadequate volume of blood received in culture bottles   Culture   Final    NO GROWTH 3 DAYS Performed at [facility], [address] [place], [place] [address]    Report Status PENDING  Incomplete  Culture, blood (Routine x 2)     Status: None (Preliminary result)   Collection Time: 08/14/19  2:28 AM   Specimen: BLOOD  Result Value Ref Range Status   Specimen Description BLOOD LEFT ANTECUBITAL  Final   Special Requests   Final    BOTTLES DRAWN AEROBIC AND ANAEROBIC Blood Culture adequate volume   Culture   Final    NO GROWTH 3 DAYS Performed at [facility], [address] [place], [place] [address]    Report Status PENDING  Incomplete  SARS CORONAVIRUS 2 (TAT 6-24 HRS) Nasopharyngeal Nasopharyngeal Swab     Status: None   Collection Time: 08/14/19  2:36 AM   Specimen: Nasopharyngeal Swab  Result Value Ref Range Status   SARS Coronavirus 2 NEGATIVE NEGATIVE Final    Comment: (NOTE) SARS-CoV-2 target nucleic acids are NOT DETECTED. The SARS-CoV-2 RNA is generally detectable in upper and lower respiratory specimens during the acute phase of infection.  Negative results do not preclude SARS-CoV-2 infection, do not rule out co-infections with other pathogens, and should not be used as the sole basis for treatment or other patient management decisions. Negative results must be combined with clinical observations, patient history, and  epidemiological information. The expected result is Negative. Fact Sheet for Patients: HairSlick.no Fact Sheet for Healthcare Providers: quierodirigir.com This test is not yet approved or cleared by the [place] FDA and  has been authorized for detection and/or diagnosis of SARS-CoV-2 by FDA under an Emergency Use Authorization (EUA). This EUA will remain  in effect (meaning this test can be used) for the duration of the COVID-19 declaration under Section 56 4(b)(1) of the Act, 21 U.S.C. section 360bbb-3(b)(1), unless the authorization is terminated or revoked sooner. Performed at [facility], [address], [place], [place] [address]    Time coordinating discharge: 32 mins  SIGNED:  Standley Dakins, MD  [facility] 08/17/2019, 10:06 AM How to contact the [facility] Attending or Consulting provider 7A - 7P or covering provider during after hours 7P -7A, for this patient?  1. Check the care team in [facility] and look for a) attending/consulting [facility] provider listed and b) the [facility] team listed 2. Log into [email or web address] and use [facility]'s universal password to access. If you do not have the password, please contact the hospital operator. 3. Locate the [facility] provider you are looking for under [facility] and page to a number that you can be directly reached. 4. If you still have difficulty reaching the provider, please page the [facility] (Director on Call) for the Hospitalists listed on amion for assistance.

## 2019-08-17 NOTE — Progress Notes (Signed)
Nsg Discharge Note  Admit Date:  08/14/2019 Discharge date: 08/17/2019   [name] to be D/C'd Home per MD order.  AVS completed.  Copy for chart, and copy for patient signed, and dated. Patient/caregiver able to verbalize understanding.  Discharge Medication: Allergies as of 08/17/2019      Reactions   Paxil [paroxetine Hcl]    Suicidal thoughts   Morphine And Related Other (See Comments)   "it just freaks me out"   Latex Itching, Swelling, Rash   Sulfa Antibiotics Nausea And Vomiting, Rash      Medication List    STOP taking these medications   levothyroxine 50 MCG tablet Commonly known as: Synthroid     TAKE these medications   cephALEXin 500 MG capsule Commonly known as: KEFLEX Take 2 capsules (1,000 mg total) by mouth every 12 (twelve) hours for 5 days.   famotidine 20 MG tablet Commonly known as: PEPCID Take 1 tablet (20 mg total) by mouth 2 (two) times daily.   insulin aspart 100 UNIT/ML injection Commonly known as: novoLOG SLIDING SCALE 3 TIMES DAILY WITH MEALS: For blood sugar 70-120-no insulin. Blood sugar 121-150 take 5 units. Blood sugar 151-208 take 8 units. Blood sugar 201-250 take 12 units. Blood sugar 251-300 take 17 units. Blood sugar 301-350 take 20 units. Blood sugar 351-400 take 25 units. Blood sugar greater than 400 take 28 units and call your doctor.   insulin glargine 100 UNIT/ML injection Commonly known as: LANTUS Inject 0.65 mLs (65 Units total) into the skin at bedtime.   metoCLOPramide 10 MG tablet Commonly known as: REGLAN Take 10 mg by mouth 3 (three) times daily.   ondansetron 4 MG tablet Commonly known as: ZOFRAN Take 4 mg by mouth every 8 (eight) hours as needed.   saccharomyces boulardii 250 MG capsule Commonly known as: FLORASTOR Take 1 capsule (250 mg total) by mouth 2 (two) times daily for 7 days.   TYLENOL 500 MG tablet Generic drug: acetaminophen Take 1,000 mg by mouth every 6 (six) hours as needed for mild pain.    Victoza 18 MG/3ML Sopn Generic drug: liraglutide Inject 0.2 mLs into the skin daily.       Discharge Assessment: Vitals:   08/17/19 0547 08/17/19 0752  BP: 131/62   Pulse: 88   Resp: 20   Temp: 98 F (36.7 C)   SpO2: 95% 96%   Skin clean, dry and intact without evidence of skin break down, no evidence of skin tears noted. IV catheter discontinued intact. Site without signs and symptoms of complications - no redness or edema noted at insertion site, patient denies c/o pain - only slight tenderness at site.  Dressing with slight pressure applied.  D/c Instructions-Education: Discharge instructions given to patient/family with verbalized understanding. D/c education completed with patient/family including follow up instructions, medication list, d/c activities limitations if indicated, with other d/c instructions as indicated by MD - patient able to verbalize understanding, all questions fully answered. Patient instructed to return to ED, call 911, or call MD for any changes in condition.  Patient escorted via WC, and D/C home via private auto.  Lonn Georgia, RN 08/17/2019 12:42 PM

## 2019-08-19 LAB — CULTURE, BLOOD (ROUTINE X 2)
Culture: NO GROWTH
Culture: NO GROWTH
Special Requests: ADEQUATE

## 2019-09-18 ENCOUNTER — Telehealth: Payer: Self-pay | Admitting: Gastroenterology

## 2019-09-18 NOTE — Telephone Encounter (Signed)
Patient requesting transfer to [facility].  States she is very unhappy with the care she received at [facility] and never wants to go back.  Patient request a female physician sending records from [facility] and [facility] records are in [place].  Please advise

## 2019-09-18 NOTE — Telephone Encounter (Signed)
Okay.  Thanks.

## 2019-09-25 ENCOUNTER — Encounter: Payer: Self-pay | Admitting: Gastroenterology

## 2019-10-04 ENCOUNTER — Ambulatory Visit: Payer: Medicaid Other | Attending: Internal Medicine

## 2019-10-04 DIAGNOSIS — Z23 Encounter for immunization: Secondary | ICD-10-CM

## 2019-10-04 NOTE — Progress Notes (Signed)
   Covid-19 Vaccination Clinic  Name:  Brittany [name]    MRN: [id number] DOB: 10-22-1980  10/04/2019  Brittany [name] was observed post Covid-19 immunization for 15 minutes without incident. She was provided with Vaccine Information Sheet and instruction to access the V-Safe system.   Brittany [name] was instructed to call 911 with any severe reactions post vaccine: Marland Kitchen Difficulty breathing  . Swelling of face and throat  . A fast heartbeat  . A bad rash all over body  . Dizziness and weakness   Immunizations Administered    Name Date Dose VIS Date Route   Moderna COVID-19 Vaccine 10/04/2019  1:00 PM 0.5 mL 06/11/2019 Intramuscular   Manufacturer: Moderna   Lot: [id number]   NDC: 53614-431-54

## 2019-10-29 ENCOUNTER — Encounter: Payer: Self-pay | Admitting: Gastroenterology

## 2019-10-29 ENCOUNTER — Ambulatory Visit: Payer: Medicaid Other | Admitting: Gastroenterology

## 2019-10-29 VITALS — HR 68 | Temp 97.2°F

## 2019-10-29 DIAGNOSIS — R112 Nausea with vomiting, unspecified: Secondary | ICD-10-CM | POA: Diagnosis not present

## 2019-10-29 DIAGNOSIS — K8081 Other cholelithiasis with obstruction: Secondary | ICD-10-CM | POA: Diagnosis not present

## 2019-10-29 MED ORDER — PANTOPRAZOLE SODIUM 40 MG PO TBEC: 40.0000 mg | DELAYED_RELEASE_TABLET | Freq: Every day | ORAL | 3 refills | Status: DC

## 2019-10-29 NOTE — Progress Notes (Signed)
Referring Provider: Leeanne Rio, MD Primary Care Physician:  Ledell Noss, [facility]  Reason for Consultation: Nausea and vomiting   IMPRESSION:  Chronic nausea and vomiting Cholelithiasis Pelvic lymphadenopathy noted on prior CT scan in 2018 and 2019 Splenomegaly noted on prior CT scan Diabetes with HgbA1c 7.2  Chronic nausea and vomiting without etiology identified on ultrasound, contrasted CT scan, or HIDA scan.  She did not tolerate a gastric emptying scan.  No alarm features.  Differential includes gastroparesis, peptic ulcer disease, reflux esophagitis, eosinophilic esophagitis, infectious esophagitis, H. pylori, and small intestinal bacterial overgrowth.  Less likely celiac disease.  No prior endoscopic evaluation.  Seems to have some depression.  Although this may be due to her chronic symptoms it may also be exacerbating her symptoms.  Cholelithiasis seen on prior imaging.  Ongoing symptoms do not sound consistent with symptomatic cholelithiasis.    Pelvic lymphadenopathy seen on prior CT scan.  The patient feels that her lymphadenopathy noted on prior CT scan was worked up by an Materials engineer.  Work to obtain those records particularly given the concurrent splenomegaly.  Low threshold to repeat contrasted cross-sectional imaging in the event these findings are contributing to her symptoms.  PLAN: Add pantoprazole 40 mg BID in attempt to control symptoms Continue to use Reglan PRN UGI series EGD with esophageal, gastric, and duodenal biopsies at the hospital Follow-up in the office after the EGD Consider trial off dairy, gluten, and sugar substitutes Work to maximize control of diabetes Work to achieve a healthy weight Consider empiric trial of rifaximin Obtain records from cancer center at [facility] re: LAD on CT scan Reviewed possibility of concurrent depression with Dr. Huel Cote Low threshold to repeat CT scan  Please see the "Patient Instructions" section  for addition details about the plan.  I spent 60, including in depth chart review, independent review of results, communicating results with the patient directly, face-to-face time with the patient, coordinating care, ordering studies and medications as appropriate, and documentation.   HPI: Brittany [name] is a 39 y.o. female referred by Dr. Huel Cote for further evaluation of nausea and vomiting.  The history is obtained through the patient, her husband who accompanies her to this appointment, and review of her electronic health record.  Some prior records are available through [facility]. She has poorly controlled diabetes under the care of endocrinologist, obesity, lymphedema, sleep apnea, reflux, hypothyroidism.  History of inguinal herniorraphy x 3. Her last hemoglobin A1c was 7.5.    Nausea and vomiting developed 05/2017.  She has previously been under the care of [facility] and [facility] for Gastrointestinal Diseases. Seen by NP Setzer at the [facility] clinic for gastrointestinal diseases for nausea and vomiting 01/2018, 04/2018, and 09/2018. Felt like she was told that the symptoms were related to her weight and did not want to return.  Has been hospitalized for these symptoms many times, most recently 08/17/2019.  Labs at that time show a normal comprehensive metabolic panel except for a glucose of 127 and an albumin of 3.3.  Liver enzymes were normal.  CBC was normal including hemoglobin 12.6, MCV 91.2, RDW 14.3, platelets 230.  Constant nausea from the time she wakes up in the morning. Frequent emesis of bile as well as post-prandial vomiting that occurs within 15-20 minutes of eating. Vomits at least 2-3 times daily. No emesis, regurgitation, heartburn, dysphonia, sore throat, or neck pain. Associated constant RUQ pain that is worsened positionally.  Alternating diarrhea or constipation. Bowel  movement multiple times a day up until one bowel movement every  2 to 3 days. No blood or mucus in the stool. Has not tried dairy free, gluten free, or red meat free diets.  Was prescribed famotidine in the hospital but is not taking it because she remembers a physician telling her not to take it. No prior PPI use.  Using Zofran as needed. Using Reglan but only once a day. Increased to TID by Dr. Rucker 10/14/19, although only using it once because she is only eating one meal every day.  Previously using ibuprofen to control her pain, but, stopped after she was hospitalized.  No other use of diet, prescription medications, OTC medications, or herbal therapies to control her symptoms.   She is concerned that she may have a hernia. Her husband is concerned about the severity of her symptoms.   Abdominal ultrasound 05/13/2017 showed an echogenic liver and gallstones the largest measuring 3.1 cm CT of the abdomen and pelvis with contrast 01/26/2017 showed no acute process.  Cholelithiasis, splenomegaly, and external iliac and left pelvic sidewall lymphadenopathy present.  Also a fat-containing periumbilical hernia. CT of the abdomen and pelvis 07/13/2017 showed no acute process to explain her symptoms.  There was interval increase in a left external iliac and pelvic sidewall lymph nodes as well as splenomegaly. Gastric emptying scan 01/16/2018 was aborted because the patient vomited 1 hour after the ingested dose Abdominal films 02/04/2018 cholelithiasis without other abnormalities HIDA scan with CCK 10/01/2019 showed no evidence of cholecystitis.  Gallbladder EF 52%.  No prior endoscopic evaluation.   No known family history of colon cancer or polyps. No family history of uterine/endometrial cancer, pancreatic cancer or gastric/stomach cancer.   Past Medical History:  Diagnosis Date  . Anxiety   . Carpal tunnel syndrome on left 10/12/2016  . Cellulitis and abscess of leg   . Chronic abdominal pain   . Depression   . Diabetes mellitus without complication (HCC)     . Gallstones   . Gastroparesis   . GERD (gastroesophageal reflux disease) 08/13/2017  . Headache   . Hypothyroid 01/03/2018  . Lymphedema   . Sleep apnea    DX with sleep apnea - unable to use c-pap  . Umbilical hernia     Past Surgical History:  Procedure Laterality Date  . CESAREAN SECTION  11/09/01  . HERNIA REPAIR    . INSERTION OF MESH N/A 01/02/2013   Procedure: INSERTION OF MESH;  Surgeon: Brian Layton, DO;  Location: [facility];  Service: General;  Laterality: N/A;  . UMBILICAL HERNIA REPAIR N/A 01/02/2013   Procedure: LAPAROSCOPIC UMBILICAL HERNIA;  Surgeon: Brian Layton, DO;  Location: [facility];  Service: General;  Laterality: N/A;    Current Outpatient Medications  Medication Sig Dispense Refill  . acetaminophen (TYLENOL) 500 MG tablet Take 1,000 mg by mouth every 6 (six) hours as needed for mild pain.    . famotidine (PEPCID) 20 MG tablet Take 1 tablet (20 mg total) by mouth 2 (two) times daily. 30 tablet 0  . insulin aspart (NOVOLOG) 100 UNIT/ML injection SLIDING SCALE 3 TIMES DAILY WITH MEALS: For blood sugar 70-120-no insulin. Blood sugar 121-150 take 5 units. Blood sugar 151-208 take 8 units. Blood sugar 201-250 take 12 units. Blood sugar 251-300 take 17 units. Blood sugar 301-350 take 20 units. Blood sugar 351-400 take 25 units. Blood sugar greater than 400 take 28 units and call your doctor. 10 mL 0  . insulin glargine (LANTUS) 100 UNIT/ML   injection Inject 0.65 mLs (65 Units total) into the skin at bedtime. 10 mL 11  . metoCLOPramide (REGLAN) 10 MG tablet Take 10 mg by mouth 3 (three) times daily.    . ondansetron (ZOFRAN) 4 MG tablet Take 4 mg by mouth every 8 (eight) hours as needed.    . VICTOZA 18 MG/3ML SOPN Inject 0.2 mLs into the skin daily.     No current facility-administered medications for this visit.    Allergies as of 10/29/2019 - Review Complete 08/14/2019  Allergen Reaction Noted  . Paxil [paroxetine hcl]  01/16/2018  . Morphine and related Other (See  Comments) 01/26/2017  . Latex Itching, Swelling, and Rash 11/26/2012  . Sulfa antibiotics Nausea And Vomiting and Rash 11/26/2012    Family History  Problem Relation Age of Onset  . Cancer Mother        leukemia  . Diabetes Mother   . Hypertension Mother   . Hyperlipidemia Mother   . Other Father        Sciatica, kidney stones  . Alcohol abuse Paternal Grandfather   . Other Paternal Grandmother        bowel obstruction; pneumonia  . Hypertension Maternal Grandmother   . Diabetes Maternal Grandmother   . Cancer Maternal Grandfather        lung  . ADD / ADHD Daughter   . Depression Daughter   . Anxiety disorder Daughter     Social History   Socioeconomic History  . Marital status: Married    Spouse name: Not on file  . Number of children: 1  . Years of education: 11  . Highest education level: Not on file  Occupational History  . Occupation: N/A  Tobacco Use  . Smoking status: Never Smoker  . Smokeless tobacco: Never Used  Substance and Sexual Activity  . Alcohol use: No  . Drug use: No  . Sexual activity: Yes    Birth control/protection: None    Comment: husband  Other Topics Concern  . Not on file  Social History Narrative   Lives at home w/ her husband and daughter   Right-handed   Caffeine daily   Social Determinants of Health   Financial Resource Strain:   . Difficulty of Paying Living Expenses:   Food Insecurity:   . Worried About Running Out of Food in the Last Year:   . Ran Out of Food in the Last Year:   Transportation Needs:   . Lack of Transportation (Medical):   . Lack of Transportation (Non-Medical):   Physical Activity:   . Days of Exercise per Week:   . Minutes of Exercise per Session:   Stress:   . Feeling of Stress :   Social Connections:   . Frequency of Communication with Friends and Family:   . Frequency of Social Gatherings with Friends and Family:   . Attends Religious Services:   . Active Member of Clubs or Organizations:     . Attends Club or Organization Meetings:   . Marital Status:   Intimate Partner Violence:   . Fear of Current or Ex-Partner:   . Emotionally Abused:   . Physically Abused:   . Sexually Abused:     Review of Systems: 12 system ROS is negative except as noted above with the additions of headaches, night sweats, insomnia, and lymphedema.   Physical Exam: General:   Alert,  well-nourished, pleasant and cooperative in NAD. Examined an electronic whellchair.  Head:  Normocephalic and atraumatic. Eyes:    Sclera clear, no icterus.   Conjunctiva pink. Ears:  Normal auditory acuity. Nose:  No deformity, discharge,  or lesions. Mouth:  No deformity or lesions.   Neck:  Supple; no masses or thyromegaly. Lungs:  Clear throughout to auscultation.   No wheezes. Heart:  Regular rate and rhythm; no murmurs. Abdomen:  Soft, central obesity, nontender, nondistended, normal bowel sounds, no rebound or guarding. No hepatosplenomegaly.   Rectal:  Deferred  Msk:  Symmetrical. No boney deformities LAD: No inguinal or umbilical LAD Extremities:  No clubbing or edema. Neurologic:  Alert and  oriented x4;  grossly nonfocal Skin:  Intact without significant lesions or rashes. Psych:  Alert and cooperative. Normal mood and affect.    Chestine Belknap L. Tache Bobst, MD, MPH 10/29/2019, 9:13 AM     

## 2019-10-29 NOTE — H&P (View-Only) (Signed)
Referring Provider: Leeanne Rio, MD Primary Care Physician:  Ledell Noss, [facility]  Reason for Consultation: Nausea and vomiting   IMPRESSION:  Chronic nausea and vomiting Cholelithiasis Pelvic lymphadenopathy noted on prior CT scan in 2018 and 2019 Splenomegaly noted on prior CT scan Diabetes with HgbA1c 7.2  Chronic nausea and vomiting without etiology identified on ultrasound, contrasted CT scan, or HIDA scan.  She did not tolerate a gastric emptying scan.  No alarm features.  Differential includes gastroparesis, peptic ulcer disease, reflux esophagitis, eosinophilic esophagitis, infectious esophagitis, H. pylori, and small intestinal bacterial overgrowth.  Less likely celiac disease.  No prior endoscopic evaluation.  Seems to have some depression.  Although this may be due to her chronic symptoms it may also be exacerbating her symptoms.  Cholelithiasis seen on prior imaging.  Ongoing symptoms do not sound consistent with symptomatic cholelithiasis.    Pelvic lymphadenopathy seen on prior CT scan.  The patient feels that her lymphadenopathy noted on prior CT scan was worked up by an Materials engineer.  Work to obtain those records particularly given the concurrent splenomegaly.  Low threshold to repeat contrasted cross-sectional imaging in the event these findings are contributing to her symptoms.  PLAN: Add pantoprazole 40 mg BID in attempt to control symptoms Continue to use Reglan PRN UGI series EGD with esophageal, gastric, and duodenal biopsies at the hospital Follow-up in the office after the EGD Consider trial off dairy, gluten, and sugar substitutes Work to maximize control of diabetes Work to achieve a healthy weight Consider empiric trial of rifaximin Obtain records from cancer center at [facility] re: LAD on CT scan Reviewed possibility of concurrent depression with Dr. Huel Cote Low threshold to repeat CT scan  Please see the "Patient Instructions" section  for addition details about the plan.  I spent 60, including in depth chart review, independent review of results, communicating results with the patient directly, face-to-face time with the patient, coordinating care, ordering studies and medications as appropriate, and documentation.   HPI: Brittany [name] is a 39 y.o. female referred by Dr. Huel Cote for further evaluation of nausea and vomiting.  The history is obtained through the patient, her husband who accompanies her to this appointment, and review of her electronic health record.  Some prior records are available through [facility]. She has poorly controlled diabetes under the care of endocrinologist, obesity, lymphedema, sleep apnea, reflux, hypothyroidism.  History of inguinal herniorraphy x 3. Her last hemoglobin A1c was 7.5.    Nausea and vomiting developed 05/2017.  She has previously been under the care of [facility] and [facility] for Gastrointestinal Diseases. Seen by NP Setzer at the [facility] clinic for gastrointestinal diseases for nausea and vomiting 01/2018, 04/2018, and 09/2018. Felt like she was told that the symptoms were related to her weight and did not want to return.  Has been hospitalized for these symptoms many times, most recently 08/17/2019.  Labs at that time show a normal comprehensive metabolic panel except for a glucose of 127 and an albumin of 3.3.  Liver enzymes were normal.  CBC was normal including hemoglobin 12.6, MCV 91.2, RDW 14.3, platelets 230.  Constant nausea from the time she wakes up in the morning. Frequent emesis of bile as well as post-prandial vomiting that occurs within 15-20 minutes of eating. Vomits at least 2-3 times daily. No emesis, regurgitation, heartburn, dysphonia, sore throat, or neck pain. Associated constant RUQ pain that is worsened positionally.  Alternating diarrhea or constipation. Bowel  movement multiple times a day up until one bowel movement every  2 to 3 days. No blood or mucus in the stool. Has not tried dairy free, gluten free, or red meat free diets.  Was prescribed famotidine in the hospital but is not taking it because she remembers a physician telling her not to take it. No prior PPI use.  Using Zofran as needed. Using Reglan but only once a day. Increased to TID by Dr. Wyline Mood 10/14/19, although only using it once because she is only eating one meal every day.  Previously using ibuprofen to control her pain, but, stopped after she was hospitalized.  No other use of diet, prescription medications, OTC medications, or herbal therapies to control her symptoms.   She is concerned that she may have a hernia. Her husband is concerned about the severity of her symptoms.   Abdominal ultrasound 05/13/2017 showed an echogenic liver and gallstones the largest measuring 3.1 cm CT of the abdomen and pelvis with contrast 01/26/2017 showed no acute process.  Cholelithiasis, splenomegaly, and external iliac and left pelvic sidewall lymphadenopathy present.  Also a fat-containing periumbilical hernia. CT of the abdomen and pelvis 07/13/2017 showed no acute process to explain her symptoms.  There was interval increase in a left external iliac and pelvic sidewall lymph nodes as well as splenomegaly. Gastric emptying scan 01/16/2018 was aborted because the patient vomited 1 hour after the ingested dose Abdominal films 02/04/2018 cholelithiasis without other abnormalities HIDA scan with CCK 10/01/2019 showed no evidence of cholecystitis.  Gallbladder EF 52%.  No prior endoscopic evaluation.   No known family history of colon cancer or polyps. No family history of uterine/endometrial cancer, pancreatic cancer or gastric/stomach cancer.   Past Medical History:  Diagnosis Date  . Anxiety   . Carpal tunnel syndrome on left 10/12/2016  . Cellulitis and abscess of leg   . Chronic abdominal pain   . Depression   . Diabetes mellitus without complication (HCC)     . Gallstones   . Gastroparesis   . GERD (gastroesophageal reflux disease) 08/13/2017  . Headache   . Hypothyroid 01/03/2018  . Lymphedema   . Sleep apnea    DX with sleep apnea - unable to use c-pap  . Umbilical hernia     Past Surgical History:  Procedure Laterality Date  . CESAREAN SECTION  11/09/01  . HERNIA REPAIR    . INSERTION OF MESH N/A 01/02/2013   Procedure: INSERTION OF MESH;  Surgeon: Lodema Pilot, DO;  Location: [facility];  Service: General;  Laterality: N/A;  . UMBILICAL HERNIA REPAIR N/A 01/02/2013   Procedure: LAPAROSCOPIC UMBILICAL HERNIA;  Surgeon: Lodema Pilot, DO;  Location: [facility];  Service: General;  Laterality: N/A;    Current Outpatient Medications  Medication Sig Dispense Refill  . acetaminophen (TYLENOL) 500 MG tablet Take 1,000 mg by mouth every 6 (six) hours as needed for mild pain.    . famotidine (PEPCID) 20 MG tablet Take 1 tablet (20 mg total) by mouth 2 (two) times daily. 30 tablet 0  . insulin aspart (NOVOLOG) 100 UNIT/ML injection SLIDING SCALE 3 TIMES DAILY WITH MEALS: For blood sugar 70-120-no insulin. Blood sugar 121-150 take 5 units. Blood sugar 151-208 take 8 units. Blood sugar 201-250 take 12 units. Blood sugar 251-300 take 17 units. Blood sugar 301-350 take 20 units. Blood sugar 351-400 take 25 units. Blood sugar greater than 400 take 28 units and call your doctor. 10 mL 0  . insulin glargine (LANTUS) 100 UNIT/ML  injection Inject 0.65 mLs (65 Units total) into the skin at bedtime. 10 mL 11  . metoCLOPramide (REGLAN) 10 MG tablet Take 10 mg by mouth 3 (three) times daily.    . ondansetron (ZOFRAN) 4 MG tablet Take 4 mg by mouth every 8 (eight) hours as needed.    Marland Kitchen VICTOZA 18 MG/3ML SOPN Inject 0.2 mLs into the skin daily.     No current facility-administered medications for this visit.    Allergies as of 10/29/2019 - Review Complete 08/14/2019  Allergen Reaction Noted  . Paxil [paroxetine hcl]  01/16/2018  . Morphine and related Other (See  Comments) 01/26/2017  . Latex Itching, Swelling, and Rash 11/26/2012  . Sulfa antibiotics Nausea And Vomiting and Rash 11/26/2012    Family History  Problem Relation Age of Onset  . Cancer Mother        leukemia  . Diabetes Mother   . Hypertension Mother   . Hyperlipidemia Mother   . Other Father        Sciatica, kidney stones  . Alcohol abuse Paternal Grandfather   . Other Paternal Grandmother        bowel obstruction; pneumonia  . Hypertension Maternal Grandmother   . Diabetes Maternal Grandmother   . Cancer Maternal Grandfather        lung  . ADD / ADHD Daughter   . Depression Daughter   . Anxiety disorder Daughter     Social History   Socioeconomic History  . Marital status: Married    Spouse name: Not on file  . Number of children: 1  . Years of education: 44  . Highest education level: Not on file  Occupational History  . Occupation: N/A  Tobacco Use  . Smoking status: Never Smoker  . Smokeless tobacco: Never Used  Substance and Sexual Activity  . Alcohol use: No  . Drug use: No  . Sexual activity: Yes    Birth control/protection: None    Comment: husband  Other Topics Concern  . Not on file  Social History Narrative   Lives at home w/ her husband and daughter   Right-handed   Caffeine daily   Social Determinants of Health   Financial Resource Strain:   . Difficulty of Paying Living Expenses:   Food Insecurity:   . Worried About Programme researcher, broadcasting/film/video in the Last Year:   . Barista in the Last Year:   Transportation Needs:   . Freight forwarder (Medical):   Marland Kitchen Lack of Transportation (Non-Medical):   Physical Activity:   . Days of Exercise per Week:   . Minutes of Exercise per Session:   Stress:   . Feeling of Stress :   Social Connections:   . Frequency of Communication with Friends and Family:   . Frequency of Social Gatherings with Friends and Family:   . Attends Religious Services:   . Active Member of Clubs or Organizations:     . Attends Banker Meetings:   Marland Kitchen Marital Status:   Intimate Partner Violence:   . Fear of Current or Ex-Partner:   . Emotionally Abused:   Marland Kitchen Physically Abused:   . Sexually Abused:     Review of Systems: 12 system ROS is negative except as noted above with the additions of headaches, night sweats, insomnia, and lymphedema.   Physical Exam: General:   Alert,  well-nourished, pleasant and cooperative in NAD. Examined an Proofreader.  Head:  Normocephalic and atraumatic. Eyes:  Sclera clear, no icterus.   Conjunctiva pink. Ears:  Normal auditory acuity. Nose:  No deformity, discharge,  or lesions. Mouth:  No deformity or lesions.   Neck:  Supple; no masses or thyromegaly. Lungs:  Clear throughout to auscultation.   No wheezes. Heart:  Regular rate and rhythm; no murmurs. Abdomen:  Soft, central obesity, nontender, nondistended, normal bowel sounds, no rebound or guarding. No hepatosplenomegaly.   Rectal:  Deferred  Msk:  Symmetrical. No boney deformities LAD: No inguinal or umbilical LAD Extremities:  No clubbing or edema. Neurologic:  Alert and  oriented x4;  grossly nonfocal Skin:  Intact without significant lesions or rashes. Psych:  Alert and cooperative. Normal mood and affect.    Mckinnon Glick L. Orvan Falconer, MD, MPH 10/29/2019, 9:13 AM

## 2019-10-29 NOTE — Patient Instructions (Addendum)
We have sent the following medications to your pharmacy for you to pick up at your convenience:  Pantoprazole 40 mg twice a day   Continue to use Reglan as needed   You have been scheduled for an Upper GI Series at [facility]. Your appointment is on 11-11-19 at 9:10 am. Please arrive 15 minutes prior to your test for registration. Make sure not to eat or drink anything after midnight on the night before your test. If you need to reschedule, please call radiology at [phone]. ________________________________________________________________ An upper GI series uses x rays to help diagnose problems of the upper GI tract, which includes the esophagus, stomach, and duodenum. The duodenum is the first part of the small intestine. An upper GI series is conducted by a radiology technologist or a radiologist--a doctor who specializes in x-ray imaging--at a hospital or outpatient center. While sitting or standing in front of an x-ray machine, the patient drinks barium liquid, which is often white and has a chalky consistency and taste. The barium liquid coats the lining of the upper GI tract and makes signs of disease show up more clearly on x rays. X-ray video, called fluoroscopy, is used to view the barium liquid moving through the esophagus, stomach, and duodenum. Additional x rays and fluoroscopy are performed while the patient lies on an x-ray table. To fully coat the upper GI tract with barium liquid, the technologist or radiologist may press on the abdomen or ask the patient to change position. Patients hold still in various positions, allowing the technologist or radiologist to take x rays of the upper GI tract at different angles. If a technologist conducts the upper GI series, a radiologist will later examine the images to look for problems.  This test typically takes about 1 hour to complete. __________________________________________________________________  Bonita Quin have been scheduled for an  endoscopy. Please follow written instructions given to you at your visit today. If you use inhalers (even only as needed), please bring them with you on the day of your procedure.   I value your feedback and thank you for entrusting [place] with your care. If you get a [facility] patient survey, I would appreciate you taking the time to let us know about your experience today. Thank you!   Due to recent changes in healthcare laws, you may see the results of your imaging and laboratory studies on MyChart before your provider has had a chance to review them.  We understand that in some cases there may be results that are confusing or concerning to you. Not all laboratory results come back in the same time frame and the provider may be waiting for multiple results in order to interpret others.  Please give [place] 48 hours in order for your provider to thoroughly review all the results before contacting the office for clarification of your results.

## 2019-11-01 ENCOUNTER — Other Ambulatory Visit (HOSPITAL_COMMUNITY)
Admission: RE | Admit: 2019-11-01 | Discharge: 2019-11-01 | Disposition: A | Discharge status: Home / Self Care | Payer: Medicaid Other | Source: Ambulatory Visit | Attending: Gastroenterology | Admitting: Gastroenterology

## 2019-11-01 ENCOUNTER — Other Ambulatory Visit: Payer: Self-pay

## 2019-11-06 ENCOUNTER — Ambulatory Visit: Payer: Medicaid Other

## 2019-11-07 ENCOUNTER — Ambulatory Visit (HOSPITAL_COMMUNITY)
Admission: RE | Admit: 2019-11-07 | Discharge: 2019-11-07 | Disposition: A | Discharge status: Home / Self Care | Payer: Medicaid Other | Source: Ambulatory Visit | Attending: Gastroenterology | Admitting: Gastroenterology

## 2019-11-07 ENCOUNTER — Other Ambulatory Visit (HOSPITAL_COMMUNITY): Payer: Medicaid Other

## 2019-11-07 ENCOUNTER — Other Ambulatory Visit: Payer: Self-pay

## 2019-11-07 DIAGNOSIS — R112 Nausea with vomiting, unspecified: Secondary | ICD-10-CM

## 2019-11-08 ENCOUNTER — Other Ambulatory Visit: Payer: Medicaid Other

## 2019-11-08 ENCOUNTER — Ambulatory Visit: Payer: Medicaid Other | Attending: Internal Medicine

## 2019-11-08 ENCOUNTER — Other Ambulatory Visit (HOSPITAL_COMMUNITY)
Admission: RE | Admit: 2019-11-08 | Discharge: 2019-11-08 | Disposition: A | Discharge status: Home / Self Care | Payer: Medicaid Other | Source: Ambulatory Visit | Attending: Gastroenterology | Admitting: Gastroenterology

## 2019-11-08 DIAGNOSIS — Z23 Encounter for immunization: Secondary | ICD-10-CM

## 2019-11-08 DIAGNOSIS — Z20822 Contact with and (suspected) exposure to covid-19: Secondary | ICD-10-CM | POA: Insufficient documentation

## 2019-11-08 DIAGNOSIS — Z01812 Encounter for preprocedural laboratory examination: Secondary | ICD-10-CM | POA: Diagnosis not present

## 2019-11-08 NOTE — Progress Notes (Signed)
   Covid-19 Vaccination Clinic  Name:  Brittany [name]    MRN: [id number] DOB: 1980/09/27  11/08/2019  [name] was observed post Covid-19 immunization for 15 minutes without incident. She was provided with Vaccine Information Sheet and instruction to access the V-Safe system.   [name] was instructed to call 911 with any severe reactions post vaccine: Marland Kitchen Difficulty breathing  . Swelling of face and throat  . A fast heartbeat  . A bad rash all over body  . Dizziness and weakness   Immunizations Administered    Name Date Dose VIS Date Route   Moderna COVID-19 Vaccine 11/08/2019 10:18 AM 0.5 mL 06/2019 Intramuscular   Manufacturer: Moderna   Lot: [id number]   NDC: 74142-395-32

## 2019-11-09 ENCOUNTER — Other Ambulatory Visit (HOSPITAL_COMMUNITY): Payer: Medicaid Other

## 2019-11-09 LAB — SARS CORONAVIRUS 2 (TAT 6-24 HRS): SARS Coronavirus 2: NEGATIVE

## 2019-11-11 ENCOUNTER — Ambulatory Visit (HOSPITAL_COMMUNITY)
Admission: RE | Admit: 2019-11-11 | Discharge: 2019-11-11 | Disposition: A | Discharge status: Home / Self Care | Payer: Medicaid Other | Attending: Gastroenterology | Admitting: Gastroenterology

## 2019-11-11 ENCOUNTER — Ambulatory Visit (HOSPITAL_COMMUNITY): Payer: Medicaid Other | Admitting: Anesthesiology

## 2019-11-11 ENCOUNTER — Other Ambulatory Visit (HOSPITAL_COMMUNITY): Payer: Medicaid Other

## 2019-11-11 ENCOUNTER — Encounter (HOSPITAL_COMMUNITY)
Admission: RE | Disposition: A | Discharge status: Home / Self Care | Payer: Self-pay | Source: Home / Self Care | Attending: Gastroenterology

## 2019-11-11 ENCOUNTER — Other Ambulatory Visit: Payer: Self-pay

## 2019-11-11 ENCOUNTER — Encounter (HOSPITAL_COMMUNITY): Payer: Self-pay | Admitting: Gastroenterology

## 2019-11-11 DIAGNOSIS — Z885 Allergy status to narcotic agent status: Secondary | ICD-10-CM | POA: Insufficient documentation

## 2019-11-11 DIAGNOSIS — R590 Localized enlarged lymph nodes: Secondary | ICD-10-CM | POA: Diagnosis not present

## 2019-11-11 DIAGNOSIS — G4730 Sleep apnea, unspecified: Secondary | ICD-10-CM | POA: Insufficient documentation

## 2019-11-11 DIAGNOSIS — K2980 Duodenitis without bleeding: Secondary | ICD-10-CM | POA: Diagnosis not present

## 2019-11-11 DIAGNOSIS — K2950 Unspecified chronic gastritis without bleeding: Secondary | ICD-10-CM | POA: Insufficient documentation

## 2019-11-11 DIAGNOSIS — Z888 Allergy status to other drugs, medicaments and biological substances status: Secondary | ICD-10-CM | POA: Insufficient documentation

## 2019-11-11 DIAGNOSIS — K2990 Gastroduodenitis, unspecified, without bleeding: Secondary | ICD-10-CM

## 2019-11-11 DIAGNOSIS — R112 Nausea with vomiting, unspecified: Secondary | ICD-10-CM

## 2019-11-11 DIAGNOSIS — K3184 Gastroparesis: Secondary | ICD-10-CM | POA: Insufficient documentation

## 2019-11-11 DIAGNOSIS — K2970 Gastritis, unspecified, without bleeding: Secondary | ICD-10-CM | POA: Diagnosis not present

## 2019-11-11 DIAGNOSIS — Z794 Long term (current) use of insulin: Secondary | ICD-10-CM | POA: Diagnosis not present

## 2019-11-11 DIAGNOSIS — E1143 Type 2 diabetes mellitus with diabetic autonomic (poly)neuropathy: Secondary | ICD-10-CM | POA: Insufficient documentation

## 2019-11-11 DIAGNOSIS — K219 Gastro-esophageal reflux disease without esophagitis: Secondary | ICD-10-CM | POA: Diagnosis not present

## 2019-11-11 DIAGNOSIS — Z882 Allergy status to sulfonamides status: Secondary | ICD-10-CM | POA: Diagnosis not present

## 2019-11-11 DIAGNOSIS — K228 Other specified diseases of esophagus: Secondary | ICD-10-CM | POA: Diagnosis not present

## 2019-11-11 DIAGNOSIS — Z79899 Other long term (current) drug therapy: Secondary | ICD-10-CM | POA: Diagnosis not present

## 2019-11-11 DIAGNOSIS — K8020 Calculus of gallbladder without cholecystitis without obstruction: Secondary | ICD-10-CM | POA: Diagnosis not present

## 2019-11-11 DIAGNOSIS — Z6841 Body Mass Index (BMI) 40.0 and over, adult: Secondary | ICD-10-CM | POA: Insufficient documentation

## 2019-11-11 HISTORY — PX: ESOPHAGOGASTRODUODENOSCOPY (EGD) WITH PROPOFOL: SHX5813

## 2019-11-11 HISTORY — PX: BIOPSY: SHX5522

## 2019-11-11 LAB — GLUCOSE, CAPILLARY: Glucose-Capillary: 200 mg/dL — ABNORMAL HIGH (ref 70–99)

## 2019-11-11 SURGERY — ESOPHAGOGASTRODUODENOSCOPY (EGD) WITH PROPOFOL
Anesthesia: Monitor Anesthesia Care

## 2019-11-11 MED ORDER — PROPOFOL 500 MG/50ML IV EMUL
INTRAVENOUS | Status: DC | PRN
  Administered 2019-11-11: 125 ug/kg/min via INTRAVENOUS

## 2019-11-11 MED ORDER — PROPOFOL 10 MG/ML IV BOLUS
INTRAVENOUS | Status: DC | PRN
  Administered 2019-11-11 (×3): 20 mg via INTRAVENOUS

## 2019-11-11 MED ORDER — PROPOFOL 1000 MG/100ML IV EMUL
INTRAVENOUS | Status: AC
  Filled 2019-11-11: qty 100

## 2019-11-11 MED ORDER — LACTATED RINGERS IV SOLN: INTRAVENOUS | Status: DC

## 2019-11-11 MED ORDER — SODIUM CHLORIDE 0.9 % IV SOLN: INTRAVENOUS | Status: DC

## 2019-11-11 MED ORDER — LIDOCAINE 2% (20 MG/ML) 5 ML SYRINGE
INTRAMUSCULAR | Status: DC | PRN
  Administered 2019-11-11: 100 mg via INTRAVENOUS

## 2019-11-11 MED ORDER — ONDANSETRON HCL 4 MG/2ML IJ SOLN
INTRAMUSCULAR | Status: DC | PRN
  Administered 2019-11-11: 4 mg via INTRAVENOUS

## 2019-11-11 SURGICAL SUPPLY — 15 items

## 2019-11-11 NOTE — Anesthesia Preprocedure Evaluation (Signed)
Anesthesia Evaluation  Patient identified by MRN, date of birth, ID band Patient awake    Reviewed: Allergy & Precautions, NPO status , Patient's Chart, lab work & pertinent test results  History of Anesthesia Complications Negative for: history of anesthetic complications  Airway Mallampati: II  TM Distance: >3 FB Neck ROM: Full    Dental  (+) Teeth Intact   Pulmonary sleep apnea ,    Pulmonary exam normal        Cardiovascular negative cardio ROS Normal cardiovascular exam     Neuro/Psych negative neurological ROS  negative psych ROS   GI/Hepatic Neg liver ROS, GERD  ,Chronic N/V   Endo/Other  diabetesHypothyroidism Morbid obesity (BMI 73)  Renal/GU negative Renal ROS  negative genitourinary   Musculoskeletal negative musculoskeletal ROS (+)   Abdominal (+) + obese,   Peds  Hematology negative hematology ROS (+)   Anesthesia Other Findings   Reproductive/Obstetrics                             Anesthesia Physical Anesthesia Plan  ASA: IV  Anesthesia Plan: MAC   Post-op Pain Management:    Induction: Intravenous  PONV Risk Score and Plan: 2 and Propofol infusion, TIVA and Treatment may vary due to age or medical condition  Airway Management Planned: Natural Airway, Nasal Cannula and Simple Face Mask  Additional Equipment: None  Intra-op Plan:   Post-operative Plan:   Informed Consent: I have reviewed the patients History and Physical, chart, labs and discussed the procedure including the risks, benefits and alternatives for the proposed anesthesia with the patient or authorized representative who has indicated his/her understanding and acceptance.       Plan Discussed with:   Anesthesia Plan Comments:         Anesthesia Quick Evaluation

## 2019-11-11 NOTE — Discharge Instructions (Signed)
YOU HAD AN ENDOSCOPIC PROCEDURE TODAY: Refer to the procedure report and other information in the discharge instructions given to you for any specific questions about what was found during the examination. If this information does not answer your questions, please call [place] office at [phone] to clarify.  ° °YOU SHOULD EXPECT: Some feelings of bloating in the abdomen. Passage of more gas than usual. Walking can help get rid of the air that was put into your GI tract during the procedure and reduce the bloating. If you had a lower endoscopy (such as a colonoscopy or flexible sigmoidoscopy) you may notice spotting of blood in your stool or on the toilet paper. Some abdominal soreness may be present for a day or two, also. ° °DIET: Your first meal following the procedure should be a light meal and then it is ok to progress to your normal diet. A half-sandwich or bowl of soup is an example of a good first meal. Heavy or fried foods are harder to digest and may make you feel nauseous or bloated. Drink plenty of fluids but you should avoid alcoholic beverages for 24 hours. If you had a esophageal dilation, please see attached instructions for diet.   ° °ACTIVITY: Your care partner should take you home directly after the procedure. You should plan to take it easy, moving slowly for the rest of the day. You can resume normal activity the day after the procedure however YOU SHOULD NOT DRIVE, use power tools, machinery or perform tasks that involve climbing or major physical exertion for 24 hours (because of the sedation medicines used during the test).  ° °SYMPTOMS TO REPORT IMMEDIATELY: °A gastroenterologist can be reached at any hour. Please call [phone]  for any of the following symptoms:  °Following lower endoscopy (colonoscopy, flexible sigmoidoscopy) °Excessive amounts of blood in the stool  °Significant tenderness, worsening of abdominal pains  °Swelling of the abdomen that is new, acute  °Fever of 100° or  higher  °Following upper endoscopy (EGD, EUS, ERCP, esophageal dilation) °Vomiting of blood or coffee ground material  °New, significant abdominal pain  °New, significant chest pain or pain under the shoulder blades  °Painful or persistently difficult swallowing  °New shortness of breath  °Black, tarry-looking or red, bloody stools ° °FOLLOW UP:  °If any biopsies were taken you will be contacted by phone or by letter within the next 1-3 weeks. Call [phone]  if you have not heard about the biopsies in 3 weeks.  °Please also call with any specific questions about appointments or follow up tests. ° °

## 2019-11-11 NOTE — Anesthesia Procedure Notes (Signed)
Date/Time: 11/11/2019 10:50 AM Performed by: Florene Route, CRNA Oxygen Delivery Method: Simple face mask

## 2019-11-11 NOTE — Transfer of Care (Signed)
Immediate Anesthesia Transfer of Care Note  Patient: Brittany [name]  Procedure(s) Performed: ESOPHAGOGASTRODUODENOSCOPY (EGD) WITH PROPOFOL (N/A ) BIOPSY  Patient Location: Endoscopy Unit  Anesthesia Type:MAC  Level of Consciousness: drowsy  Airway & Oxygen Therapy: Patient Spontanous Breathing and Patient connected to face mask oxygen  Post-op Assessment: Report given to RN and Post -op Vital signs reviewed and stable  Post vital signs: Reviewed and stable  Last Vitals:  Vitals Value Taken Time  BP    Temp    Pulse    Resp    SpO2      Last Pain:  Vitals:   11/11/19 0936  PainSc: 6          Complications: No apparent anesthesia complications

## 2019-11-11 NOTE — Interval H&P Note (Signed)
History and Physical Interval Note:  11/11/2019 9:20 AM  [name]  has presented today for surgery, with the diagnosis of nausea and vomiting, obesity.  The various methods of treatment have been discussed with the patient and family. After consideration of risks, benefits and other options for treatment, the patient has consented to  Procedure(s): ESOPHAGOGASTRODUODENOSCOPY (EGD) WITH PROPOFOL (N/A) as a surgical intervention.  The patient's history has been reviewed, patient examined, no change in status, stable for surgery.  I have reviewed the patient's chart and labs.  Questions were answered to the patient's satisfaction.     Brittany [name]

## 2019-11-11 NOTE — Anesthesia Postprocedure Evaluation (Signed)
Anesthesia Post Note  Patient: Brittany [name]  Procedure(s) Performed: ESOPHAGOGASTRODUODENOSCOPY (EGD) WITH PROPOFOL (N/A ) BIOPSY     Patient location during evaluation: Endoscopy Anesthesia Type: MAC Level of consciousness: awake and alert Pain management: pain level controlled Vital Signs Assessment: post-procedure vital signs reviewed and stable Respiratory status: spontaneous breathing, nonlabored ventilation and respiratory function stable Cardiovascular status: blood pressure returned to baseline and stable Postop Assessment: no apparent nausea or vomiting Anesthetic complications: no    Last Vitals:  Vitals:   11/11/19 1130 11/11/19 1132  BP:  (!) 137/91  Pulse: 86 86  Resp: 15 (!) 23  Temp:    SpO2: 98% 96%    Last Pain:  Vitals:   11/11/19 1125  PainSc: 0-No pain                 Lidia Collum

## 2019-11-11 NOTE — Op Note (Signed)
[facility] Patient Name: Brittany [name] Procedure Date: 11/11/2019 MRN: [id number] Attending MD: Tressia Danas MD, MD Date of Birth: [birth year] CSN: [id number] Age: 39 Admit Type: Outpatient Procedure:                Upper GI endoscopy Indications:              Nausea with vomiting Providers:                Tressia Danas MD, MD, Alexandria Lodge RN, RN,                            Beryle Beams, Technician, Leroy Libman, CRNA Referring MD:              Medicines:                Monitored Anesthesia Care Complications:            No immediate complications. Estimated blood loss:                            Minimal. Estimated Blood Loss:     Estimated blood loss was minimal. Procedure:                Pre-Anesthesia Assessment:                           - Prior to the procedure, a History and Physical                            was performed, and patient medications and                            allergies were reviewed. The patient's tolerance of                            previous anesthesia was also reviewed. The risks                            and benefits of the procedure and the sedation                            options and risks were discussed with the patient.                            All questions were answered, and informed consent                            was obtained. Prior Anticoagulants: The patient has                            taken no previous anticoagulant or antiplatelet                            agents. ASA Grade Assessment: III - A patient with  severe systemic disease. After reviewing the risks                            and benefits, the patient was deemed in                            satisfactory condition to undergo the procedure.                           After obtaining informed consent, the endoscope was                            passed under direct vision. Throughout the                             procedure, the patient's blood pressure, pulse, and                            oxygen saturations were monitored continuously. The                            GIF-H190 ([id number]) Olympus gastroscope was                            introduced through the mouth, and advanced to the                            third part of duodenum. The upper GI endoscopy was                            accomplished without difficulty. The patient                            tolerated the procedure well. Scope In: Scope Out: Findings:      The Z-line was irregular and was found 35 cm from the incisors. Biopsies       were obtained from the proximal and distal esophagus with cold forceps       for histology of suspected eosinophilic esophagitis. Estimated blood       loss was minimal.      Patchy mild inflammation characterized by friability and granularity was       found in the gastric fundus and in the gastric body. Biopsies were taken       with a cold forceps for histology. Estimated blood loss was minimal.      A medium amount of food (residue) was found in the gastric fundus, in       the gastric body and in the gastric antrum.      The examined duodenum was normal. Biopsies were taken with a cold       forceps for histology. Estimated blood loss was minimal.      Food (residue) was found in the duodenal bulb and in the first portion       of the duodenum.      The cardia and gastric fundus were normal on retroflexion.      The exam was otherwise without  abnormality. Impression:               - Z-line irregular, 35 cm from the incisors.                            Biopsied.                           - Mild Gastritis. Biopsied.                           - A medium amount of food (residue) in the stomach                            and duodenum.                           - Nausea and vomiting likely due to gastroparesis. Moderate Sedation:      Not Applicable - Patient had care per  Anesthesia. Recommendation:           - Patient has a contact number available for                            emergencies. The signs and symptoms of potential                            delayed complications were discussed with the                            patient. Return to normal activities tomorrow.                            Written discharge instructions were provided to the                            patient.                           - Resume previous diet. Consider nutrition consult                            for gastroparesis diet teaching.                           - Continue present medications including                            pantoprazole 40 mg twice daily and Reglan as needed.                           - Await pathology results.                           - Follow-up in the GI office to review these  results. Procedure Code(s):        --- Professional ---                           [id number], Esophagogastroduodenoscopy, flexible,                            transoral; with biopsy, single or multiple Diagnosis Code(s):        --- Professional ---                           K22.8, Other specified diseases of esophagus                           K29.70, Gastritis, unspecified, without bleeding                           R11.2, Nausea with vomiting, unspecified CPT copyright 2019 [employer]. All rights reserved. The codes documented in this report are preliminary and upon coder review may  be revised to meet current compliance requirements. Tressia Danas MD, MD 11/11/2019 11:13:25 AM This report has been signed electronically. Number of Addenda: 0

## 2019-11-12 ENCOUNTER — Encounter: Payer: Self-pay | Admitting: *Deleted

## 2019-11-12 ENCOUNTER — Other Ambulatory Visit: Payer: Self-pay

## 2019-11-12 LAB — SURGICAL PATHOLOGY

## 2019-11-18 ENCOUNTER — Telehealth: Payer: Self-pay

## 2019-11-18 ENCOUNTER — Other Ambulatory Visit: Payer: Self-pay

## 2019-11-18 MED ORDER — BISMUTH 262 MG PO CHEW: 262.0000 mg | CHEWABLE_TABLET | Freq: Four times a day (QID) | ORAL | 0 refills | Status: DC

## 2019-11-18 MED ORDER — TETRACYCLINE HCL 500 MG PO CAPS: 500.0000 mg | ORAL_CAPSULE | Freq: Four times a day (QID) | ORAL | 0 refills | Status: DC

## 2019-11-18 MED ORDER — PANTOPRAZOLE SODIUM 40 MG PO TBEC
40.0000 mg | DELAYED_RELEASE_TABLET | Freq: Two times a day (BID) | ORAL | 0 refills | Status: DC
Start: 2019-11-18 — End: 2021-01-17

## 2019-11-18 MED ORDER — METRONIDAZOLE 500 MG PO TABS
500.0000 mg | ORAL_TABLET | Freq: Four times a day (QID) | ORAL | 0 refills | Status: DC
Start: 2019-11-18 — End: 2021-01-17

## 2019-11-18 NOTE — Telephone Encounter (Signed)
Incoming fax from [employer]. Tetracycline isn't covered with patients insurance and its on back order. Please advise on an alternative.  I left a voicemail for the patient to not start the treatment yet until we have a new order for a replacement antibiotic.   Please advise. Thank you

## 2019-11-19 NOTE — Telephone Encounter (Signed)
Please disregard the prior recommendation for treatment. The following 14 day regimen is recommend given the cost concerns:  Pantoprazole 40 mg BID Clarithromycin 500 mg BID Amoxicillin 1g BID Metronidazole 500 mg BID  Thank you.'

## 2019-11-20 ENCOUNTER — Other Ambulatory Visit: Payer: Self-pay

## 2019-11-20 MED ORDER — AMOXICILLIN 500 MG PO CAPS: 1000.0000 mg | ORAL_CAPSULE | Freq: Two times a day (BID) | ORAL | 0 refills | Status: DC

## 2019-11-20 MED ORDER — METRONIDAZOLE 500 MG PO TABS
500.0000 mg | ORAL_TABLET | Freq: Two times a day (BID) | ORAL | 0 refills | Status: DC
Start: 2019-11-20 — End: 2021-01-17

## 2019-11-20 MED ORDER — CLARITHROMYCIN 500 MG PO TABS
500.0000 mg | ORAL_TABLET | Freq: Two times a day (BID) | ORAL | 0 refills | Status: DC
Start: 2019-11-20 — End: 2021-01-17

## 2019-11-20 MED ORDER — PANTOPRAZOLE SODIUM 40 MG PO TBEC
40.0000 mg | DELAYED_RELEASE_TABLET | Freq: Two times a day (BID) | ORAL | 0 refills | Status: DC
Start: 2019-11-20 — End: 2021-01-17

## 2019-11-20 NOTE — Telephone Encounter (Signed)
Spoke with pt and she is aware, scripts sent to pharmacy. 

## 2021-01-01 ENCOUNTER — Encounter (HOSPITAL_COMMUNITY): Payer: Self-pay | Admitting: *Deleted

## 2021-01-01 ENCOUNTER — Emergency Department (HOSPITAL_COMMUNITY)
Admission: EM | Admit: 2021-01-01 | Discharge: 2021-01-01 | Disposition: A | Discharge status: Home / Self Care | Payer: Medicaid Other | Attending: Emergency Medicine | Admitting: Emergency Medicine

## 2021-01-01 ENCOUNTER — Other Ambulatory Visit: Payer: Self-pay

## 2021-01-01 DIAGNOSIS — Z5321 Procedure and treatment not carried out due to patient leaving prior to being seen by health care provider: Secondary | ICD-10-CM | POA: Insufficient documentation

## 2021-01-01 DIAGNOSIS — R739 Hyperglycemia, unspecified: Secondary | ICD-10-CM | POA: Diagnosis not present

## 2021-01-01 LAB — CBG MONITORING, ED: Glucose-Capillary: 293 mg/dL — ABNORMAL HIGH (ref 70–99)

## 2021-01-01 NOTE — ED Triage Notes (Signed)
Pt states her sugars have been running high all day; pt denies any other symptoms

## 2021-01-16 ENCOUNTER — Other Ambulatory Visit: Payer: Self-pay

## 2021-01-16 ENCOUNTER — Inpatient Hospital Stay (HOSPITAL_COMMUNITY)
Admission: EM | Admit: 2021-01-16 | Discharge: 2021-01-20 | DRG: 872 | Disposition: A | Discharge status: Home / Self Care | Payer: Medicaid Other | Attending: Internal Medicine | Admitting: Internal Medicine

## 2021-01-16 ENCOUNTER — Encounter (HOSPITAL_COMMUNITY): Payer: Self-pay | Admitting: *Deleted

## 2021-01-16 ENCOUNTER — Emergency Department (HOSPITAL_COMMUNITY): Payer: Medicaid Other

## 2021-01-16 DIAGNOSIS — Z885 Allergy status to narcotic agent status: Secondary | ICD-10-CM

## 2021-01-16 DIAGNOSIS — E1143 Type 2 diabetes mellitus with diabetic autonomic (poly)neuropathy: Secondary | ICD-10-CM | POA: Diagnosis present

## 2021-01-16 DIAGNOSIS — I471 Supraventricular tachycardia: Secondary | ICD-10-CM | POA: Diagnosis present

## 2021-01-16 DIAGNOSIS — R9431 Abnormal electrocardiogram [ECG] [EKG]: Secondary | ICD-10-CM | POA: Diagnosis present

## 2021-01-16 DIAGNOSIS — R778 Other specified abnormalities of plasma proteins: Secondary | ICD-10-CM | POA: Diagnosis present

## 2021-01-16 DIAGNOSIS — K3184 Gastroparesis: Secondary | ICD-10-CM | POA: Diagnosis present

## 2021-01-16 DIAGNOSIS — L03116 Cellulitis of left lower limb: Secondary | ICD-10-CM | POA: Diagnosis present

## 2021-01-16 DIAGNOSIS — I4891 Unspecified atrial fibrillation: Secondary | ICD-10-CM | POA: Diagnosis present

## 2021-01-16 DIAGNOSIS — A419 Sepsis, unspecified organism: Principal | ICD-10-CM | POA: Diagnosis present

## 2021-01-16 DIAGNOSIS — E876 Hypokalemia: Secondary | ICD-10-CM | POA: Diagnosis present

## 2021-01-16 DIAGNOSIS — Z794 Long term (current) use of insulin: Secondary | ICD-10-CM

## 2021-01-16 DIAGNOSIS — R6520 Severe sepsis without septic shock: Secondary | ICD-10-CM | POA: Diagnosis present

## 2021-01-16 DIAGNOSIS — E871 Hypo-osmolality and hyponatremia: Secondary | ICD-10-CM | POA: Diagnosis present

## 2021-01-16 DIAGNOSIS — Z91040 Latex allergy status: Secondary | ICD-10-CM

## 2021-01-16 DIAGNOSIS — I248 Other forms of acute ischemic heart disease: Secondary | ICD-10-CM | POA: Diagnosis present

## 2021-01-16 DIAGNOSIS — I890 Lymphedema, not elsewhere classified: Secondary | ICD-10-CM | POA: Diagnosis present

## 2021-01-16 DIAGNOSIS — G8929 Other chronic pain: Secondary | ICD-10-CM | POA: Diagnosis present

## 2021-01-16 DIAGNOSIS — Z888 Allergy status to other drugs, medicaments and biological substances status: Secondary | ICD-10-CM

## 2021-01-16 DIAGNOSIS — E039 Hypothyroidism, unspecified: Secondary | ICD-10-CM | POA: Diagnosis present

## 2021-01-16 DIAGNOSIS — Z882 Allergy status to sulfonamides status: Secondary | ICD-10-CM

## 2021-01-16 DIAGNOSIS — R823 Hemoglobinuria: Secondary | ICD-10-CM | POA: Diagnosis present

## 2021-01-16 DIAGNOSIS — Z79899 Other long term (current) drug therapy: Secondary | ICD-10-CM

## 2021-01-16 DIAGNOSIS — E669 Obesity, unspecified: Secondary | ICD-10-CM | POA: Diagnosis present

## 2021-01-16 DIAGNOSIS — K219 Gastro-esophageal reflux disease without esophagitis: Secondary | ICD-10-CM | POA: Diagnosis present

## 2021-01-16 DIAGNOSIS — G4730 Sleep apnea, unspecified: Secondary | ICD-10-CM | POA: Diagnosis present

## 2021-01-16 DIAGNOSIS — E1165 Type 2 diabetes mellitus with hyperglycemia: Secondary | ICD-10-CM

## 2021-01-16 DIAGNOSIS — Z6841 Body Mass Index (BMI) 40.0 and over, adult: Secondary | ICD-10-CM

## 2021-01-16 DIAGNOSIS — Z20822 Contact with and (suspected) exposure to covid-19: Secondary | ICD-10-CM | POA: Diagnosis present

## 2021-01-16 DIAGNOSIS — Z833 Family history of diabetes mellitus: Secondary | ICD-10-CM

## 2021-01-16 DIAGNOSIS — G5602 Carpal tunnel syndrome, left upper limb: Secondary | ICD-10-CM | POA: Diagnosis present

## 2021-01-16 LAB — CBC WITH DIFFERENTIAL/PLATELET
Abs Immature Granulocytes: 0.11 10*3/uL — ABNORMAL HIGH (ref 0.00–0.07)
Basophils Absolute: 0.1 10*3/uL (ref 0.0–0.1)
Basophils Relative: 0 %
Eosinophils Absolute: 0 10*3/uL (ref 0.0–0.5)
Eosinophils Relative: 0 %
HCT: 47.3 % — ABNORMAL HIGH (ref 36.0–46.0)
Hemoglobin: 14.8 g/dL (ref 12.0–15.0)
Immature Granulocytes: 1 %
Lymphocytes Relative: 3 %
Lymphs Abs: 0.5 10*3/uL — ABNORMAL LOW (ref 0.7–4.0)
MCH: 28.3 pg (ref 26.0–34.0)
MCHC: 31.3 g/dL (ref 30.0–36.0)
MCV: 90.4 fL (ref 80.0–100.0)
Monocytes Absolute: 0.5 10*3/uL (ref 0.1–1.0)
Monocytes Relative: 3 %
Neutro Abs: 15 10*3/uL — ABNORMAL HIGH (ref 1.7–7.7)
Neutrophils Relative %: 93 %
Platelets: 212 10*3/uL (ref 150–400)
RBC: 5.23 MIL/uL — ABNORMAL HIGH (ref 3.87–5.11)
RDW: 14.5 % (ref 11.5–15.5)
WBC: 16.2 10*3/uL — ABNORMAL HIGH (ref 4.0–10.5)
nRBC: 0 % (ref 0.0–0.2)

## 2021-01-16 LAB — PROTIME-INR
INR: 1.1 (ref 0.8–1.2)
Prothrombin Time: 14.1 seconds (ref 11.4–15.2)

## 2021-01-16 LAB — COMPREHENSIVE METABOLIC PANEL
ALT: 25 U/L (ref 0–44)
AST: 23 U/L (ref 15–41)
Albumin: 3.9 g/dL (ref 3.5–5.0)
Alkaline Phosphatase: 94 U/L (ref 38–126)
Anion gap: 7 (ref 5–15)
BUN: 14 mg/dL (ref 6–20)
CO2: 26 mmol/L (ref 22–32)
Calcium: 8.9 mg/dL (ref 8.9–10.3)
Chloride: 100 mmol/L (ref 98–111)
Creatinine, Ser: 1.03 mg/dL — ABNORMAL HIGH (ref 0.44–1.00)
GFR, Estimated: >60 mL/min (ref >60)
Glucose, Bld: 173 mg/dL — ABNORMAL HIGH (ref 70–99)
Potassium: 4.1 mmol/L (ref 3.5–5.1)
Sodium: 133 mmol/L — ABNORMAL LOW (ref 135–145)
Total Bilirubin: 1 mg/dL (ref 0.3–1.2)
Total Protein: 8.6 g/dL — ABNORMAL HIGH (ref 6.5–8.1)

## 2021-01-16 LAB — APTT: aPTT: 30 seconds (ref 24–36)

## 2021-01-16 LAB — LACTIC ACID, PLASMA: Lactic Acid, Venous: 3 mmol/L (ref 0.5–1.9)

## 2021-01-16 MED ORDER — SODIUM CHLORIDE 0.9 % IV SOLN
2.0000 g | Freq: Once | INTRAVENOUS | Status: AC
  Administered 2021-01-17: 2 g via INTRAVENOUS
  Filled 2021-01-16: qty 20

## 2021-01-16 MED ORDER — LACTATED RINGERS IV BOLUS (SEPSIS)
1000.0000 mL | Freq: Once | INTRAVENOUS | Status: AC
  Administered 2021-01-17: 1000 mL via INTRAVENOUS

## 2021-01-16 MED ORDER — LACTATED RINGERS IV BOLUS (SEPSIS)
400.0000 mL | Freq: Once | INTRAVENOUS | Status: AC
  Administered 2021-01-17: 400 mL via INTRAVENOUS

## 2021-01-16 NOTE — ED Triage Notes (Signed)
Pt states she started to have chills tonight and on the way here fever 101.4; pt c/o pain to mass on left leg

## 2021-01-17 ENCOUNTER — Encounter (HOSPITAL_COMMUNITY): Payer: Self-pay | Admitting: Internal Medicine

## 2021-01-17 ENCOUNTER — Inpatient Hospital Stay (HOSPITAL_COMMUNITY): Payer: Medicaid Other

## 2021-01-17 DIAGNOSIS — I4891 Unspecified atrial fibrillation: Secondary | ICD-10-CM | POA: Diagnosis present

## 2021-01-17 DIAGNOSIS — E876 Hypokalemia: Secondary | ICD-10-CM | POA: Diagnosis present

## 2021-01-17 DIAGNOSIS — Z6841 Body Mass Index (BMI) 40.0 and over, adult: Secondary | ICD-10-CM | POA: Diagnosis not present

## 2021-01-17 DIAGNOSIS — I890 Lymphedema, not elsewhere classified: Secondary | ICD-10-CM

## 2021-01-17 DIAGNOSIS — L0390 Cellulitis, unspecified: Secondary | ICD-10-CM | POA: Diagnosis not present

## 2021-01-17 DIAGNOSIS — E669 Obesity, unspecified: Secondary | ICD-10-CM | POA: Diagnosis present

## 2021-01-17 DIAGNOSIS — I248 Other forms of acute ischemic heart disease: Secondary | ICD-10-CM | POA: Diagnosis present

## 2021-01-17 DIAGNOSIS — K219 Gastro-esophageal reflux disease without esophagitis: Secondary | ICD-10-CM | POA: Diagnosis present

## 2021-01-17 DIAGNOSIS — I471 Supraventricular tachycardia: Secondary | ICD-10-CM | POA: Diagnosis present

## 2021-01-17 DIAGNOSIS — L03116 Cellulitis of left lower limb: Secondary | ICD-10-CM

## 2021-01-17 DIAGNOSIS — R6520 Severe sepsis without septic shock: Secondary | ICD-10-CM | POA: Diagnosis present

## 2021-01-17 DIAGNOSIS — R778 Other specified abnormalities of plasma proteins: Secondary | ICD-10-CM | POA: Diagnosis present

## 2021-01-17 DIAGNOSIS — E1143 Type 2 diabetes mellitus with diabetic autonomic (poly)neuropathy: Secondary | ICD-10-CM | POA: Diagnosis present

## 2021-01-17 DIAGNOSIS — K3184 Gastroparesis: Secondary | ICD-10-CM | POA: Diagnosis present

## 2021-01-17 DIAGNOSIS — R509 Fever, unspecified: Secondary | ICD-10-CM | POA: Diagnosis present

## 2021-01-17 DIAGNOSIS — A419 Sepsis, unspecified organism: Principal | ICD-10-CM

## 2021-01-17 DIAGNOSIS — Z882 Allergy status to sulfonamides status: Secondary | ICD-10-CM | POA: Diagnosis not present

## 2021-01-17 DIAGNOSIS — G5602 Carpal tunnel syndrome, left upper limb: Secondary | ICD-10-CM | POA: Diagnosis present

## 2021-01-17 DIAGNOSIS — E871 Hypo-osmolality and hyponatremia: Secondary | ICD-10-CM | POA: Diagnosis present

## 2021-01-17 DIAGNOSIS — G4730 Sleep apnea, unspecified: Secondary | ICD-10-CM | POA: Diagnosis present

## 2021-01-17 DIAGNOSIS — Z885 Allergy status to narcotic agent status: Secondary | ICD-10-CM | POA: Diagnosis not present

## 2021-01-17 DIAGNOSIS — E1165 Type 2 diabetes mellitus with hyperglycemia: Secondary | ICD-10-CM | POA: Diagnosis present

## 2021-01-17 DIAGNOSIS — Z20822 Contact with and (suspected) exposure to covid-19: Secondary | ICD-10-CM | POA: Diagnosis present

## 2021-01-17 DIAGNOSIS — R7989 Other specified abnormal findings of blood chemistry: Secondary | ICD-10-CM | POA: Diagnosis present

## 2021-01-17 DIAGNOSIS — G8929 Other chronic pain: Secondary | ICD-10-CM | POA: Diagnosis present

## 2021-01-17 DIAGNOSIS — Z888 Allergy status to other drugs, medicaments and biological substances status: Secondary | ICD-10-CM | POA: Diagnosis not present

## 2021-01-17 DIAGNOSIS — E039 Hypothyroidism, unspecified: Secondary | ICD-10-CM | POA: Diagnosis present

## 2021-01-17 LAB — BASIC METABOLIC PANEL
Anion gap: 8 (ref 5–15)
Anion gap: 8 (ref 5–15)
Anion gap: 6 (ref 5–15)
BUN: 15 mg/dL (ref 6–20)
BUN: 17 mg/dL (ref 6–20)
BUN: 19 mg/dL (ref 6–20)
CO2: 23 mmol/L (ref 22–32)
CO2: 23 mmol/L (ref 22–32)
CO2: 22 mmol/L (ref 22–32)
Calcium: 8.2 mg/dL — ABNORMAL LOW (ref 8.9–10.3)
Calcium: 8.2 mg/dL — ABNORMAL LOW (ref 8.9–10.3)
Calcium: 8 mg/dL — ABNORMAL LOW (ref 8.9–10.3)
Chloride: 104 mmol/L (ref 98–111)
Chloride: 102 mmol/L (ref 98–111)
Chloride: 104 mmol/L (ref 98–111)
Creatinine, Ser: 1.06 mg/dL — ABNORMAL HIGH (ref 0.44–1.00)
Creatinine, Ser: 1.13 mg/dL — ABNORMAL HIGH (ref 0.44–1.00)
Creatinine, Ser: 1.06 mg/dL — ABNORMAL HIGH (ref 0.44–1.00)
GFR, Estimated: >60 mL/min (ref >60)
GFR, Estimated: >60 mL/min (ref >60)
GFR, Estimated: >60 mL/min (ref >60)
Glucose, Bld: 209 mg/dL — ABNORMAL HIGH (ref 70–99)
Glucose, Bld: 211 mg/dL — ABNORMAL HIGH (ref 70–99)
Glucose, Bld: 92 mg/dL (ref 70–99)
Potassium: 4.2 mmol/L (ref 3.5–5.1)
Potassium: 4.1 mmol/L (ref 3.5–5.1)
Potassium: 3.4 mmol/L — ABNORMAL LOW (ref 3.5–5.1)
Sodium: 135 mmol/L (ref 135–145)
Sodium: 133 mmol/L — ABNORMAL LOW (ref 135–145)
Sodium: 132 mmol/L — ABNORMAL LOW (ref 135–145)

## 2021-01-17 LAB — HEMOGLOBIN AND HEMATOCRIT, BLOOD
HCT: 37.2 % (ref 36.0–46.0)
Hemoglobin: 11.8 g/dL — ABNORMAL LOW (ref 12.0–15.0)

## 2021-01-17 LAB — CBC
HCT: 38.9 % (ref 36.0–46.0)
Hemoglobin: 12.3 g/dL (ref 12.0–15.0)
MCH: 28.5 pg (ref 26.0–34.0)
MCHC: 31.6 g/dL (ref 30.0–36.0)
MCV: 90.3 fL (ref 80.0–100.0)
Platelets: 223 10*3/uL (ref 150–400)
RBC: 4.31 MIL/uL (ref 3.87–5.11)
RDW: 14.8 % (ref 11.5–15.5)
WBC: 22.7 10*3/uL — ABNORMAL HIGH (ref 4.0–10.5)
nRBC: 0 % (ref 0.0–0.2)

## 2021-01-17 LAB — RESP PANEL BY RT-PCR (FLU A&B, COVID) ARPGX2
Influenza A by PCR: NEGATIVE
Influenza B by PCR: NEGATIVE
SARS Coronavirus 2 by RT PCR: NEGATIVE

## 2021-01-17 LAB — TROPONIN I (HIGH SENSITIVITY)
Troponin I (High Sensitivity): 42 ng/L — ABNORMAL HIGH (ref <18)
Troponin I (High Sensitivity): 47 ng/L — ABNORMAL HIGH (ref <18)

## 2021-01-17 LAB — URINALYSIS, ROUTINE W REFLEX MICROSCOPIC
Bilirubin Urine: NEGATIVE
Glucose, UA: NEGATIVE mg/dL
Ketones, ur: NEGATIVE mg/dL
Leukocytes,Ua: NEGATIVE
Nitrite: NEGATIVE
Protein, ur: NEGATIVE mg/dL
Specific Gravity, Urine: 1.019 (ref 1.005–1.030)
pH: 5 (ref 5.0–8.0)

## 2021-01-17 LAB — GLUCOSE, CAPILLARY
Glucose-Capillary: 132 mg/dL — ABNORMAL HIGH (ref 70–99)
Glucose-Capillary: 86 mg/dL (ref 70–99)

## 2021-01-17 LAB — LACTIC ACID, PLASMA
Lactic Acid, Venous: 2.1 mmol/L (ref 0.5–1.9)
Lactic Acid, Venous: 2.2 mmol/L (ref 0.5–1.9)
Lactic Acid, Venous: 2.6 mmol/L (ref 0.5–1.9)

## 2021-01-17 LAB — MRSA NEXT GEN BY PCR, NASAL: MRSA by PCR Next Gen: NOT DETECTED

## 2021-01-17 LAB — CBG MONITORING, ED
Glucose-Capillary: 196 mg/dL — ABNORMAL HIGH (ref 70–99)
Glucose-Capillary: 162 mg/dL — ABNORMAL HIGH (ref 70–99)

## 2021-01-17 LAB — BRAIN NATRIURETIC PEPTIDE: B Natriuretic Peptide: 119 pg/mL — ABNORMAL HIGH (ref 0.0–100.0)

## 2021-01-17 LAB — POC URINE PREG, ED: Preg Test, Ur: NEGATIVE

## 2021-01-17 LAB — TSH: TSH: 3.676 u[IU]/mL (ref 0.350–4.500)

## 2021-01-17 LAB — MAGNESIUM: Magnesium: 1.5 mg/dL — ABNORMAL LOW (ref 1.7–2.4)

## 2021-01-17 MED ORDER — VANCOMYCIN HCL 1500 MG/300ML IV SOLN
1500.0000 mg | Freq: Three times a day (TID) | INTRAVENOUS | Status: DC
  Administered 2021-01-17 – 2021-01-18 (×5): 1500 mg via INTRAVENOUS
  Filled 2021-01-17 (×4): qty 300

## 2021-01-17 MED ORDER — METOPROLOL SUCCINATE ER 50 MG PO TB24: 50.0000 mg | ORAL_TABLET | Freq: Every day | ORAL | Status: DC

## 2021-01-17 MED ORDER — ENOXAPARIN SODIUM 120 MG/0.8ML IJ SOSY
120.0000 mg | PREFILLED_SYRINGE | INTRAMUSCULAR | Status: DC
  Administered 2021-01-17 – 2021-01-19 (×3): 120 mg via SUBCUTANEOUS
  Filled 2021-01-17 (×3): qty 0.8

## 2021-01-17 MED ORDER — SODIUM CHLORIDE 0.9 % IV SOLN
2.0000 g | INTRAVENOUS | Status: DC
  Administered 2021-01-18 – 2021-01-19 (×3): 2 g via INTRAVENOUS
  Filled 2021-01-17 (×3): qty 20

## 2021-01-17 MED ORDER — INSULIN ASPART 100 UNIT/ML IJ SOLN: 0.0000 [IU] | Freq: Three times a day (TID) | INTRAMUSCULAR | Status: DC

## 2021-01-17 MED ORDER — METOPROLOL TARTRATE 5 MG/5ML IV SOLN
INTRAVENOUS | Status: AC
  Administered 2021-01-17: 5 mg
  Filled 2021-01-17: qty 5

## 2021-01-17 MED ORDER — INSULIN ASPART 100 UNIT/ML IJ SOLN: 0.0000 [IU] | Freq: Every day | INTRAMUSCULAR | Status: DC

## 2021-01-17 MED ORDER — ACETAMINOPHEN 650 MG RE SUPP: 650.0000 mg | Freq: Four times a day (QID) | RECTAL | Status: DC | PRN

## 2021-01-17 MED ORDER — LACTATED RINGERS IV SOLN: INTRAVENOUS | Status: DC

## 2021-01-17 MED ORDER — ACETAMINOPHEN 325 MG PO TABS
650.0000 mg | ORAL_TABLET | Freq: Four times a day (QID) | ORAL | Status: DC | PRN
  Administered 2021-01-17: 650 mg via ORAL
  Filled 2021-01-17: qty 2

## 2021-01-17 MED ORDER — CHLORHEXIDINE GLUCONATE CLOTH 2 % EX PADS
6.0000 | MEDICATED_PAD | Freq: Every day | CUTANEOUS | Status: DC
  Administered 2021-01-17 – 2021-01-20 (×4): 6 via TOPICAL

## 2021-01-17 MED ORDER — METOPROLOL SUCCINATE ER 25 MG PO TB24
25.0000 mg | ORAL_TABLET | Freq: Once | ORAL | Status: AC
  Administered 2021-01-17: 25 mg via ORAL
  Filled 2021-01-17: qty 1

## 2021-01-17 MED ORDER — METOPROLOL TARTRATE 5 MG/5ML IV SOLN: 5.0000 mg | Freq: Once | INTRAVENOUS | Status: AC

## 2021-01-17 MED ORDER — SODIUM CHLORIDE 0.9 % IV SOLN
8.0000 mg | Freq: Four times a day (QID) | INTRAVENOUS | Status: DC | PRN
  Administered 2021-01-18: 8 mg via INTRAVENOUS
  Filled 2021-01-17: qty 4

## 2021-01-17 MED ORDER — METOPROLOL TARTRATE 5 MG/5ML IV SOLN: 5.0000 mg | Freq: Four times a day (QID) | INTRAVENOUS | Status: DC | PRN

## 2021-01-17 MED ORDER — SACCHAROMYCES BOULARDII 250 MG PO CAPS
250.0000 mg | ORAL_CAPSULE | Freq: Two times a day (BID) | ORAL | Status: DC
  Administered 2021-01-17 – 2021-01-20 (×5): 250 mg via ORAL
  Filled 2021-01-17 (×6): qty 1

## 2021-01-17 MED ORDER — KETOROLAC TROMETHAMINE 30 MG/ML IJ SOLN
30.0000 mg | Freq: Once | INTRAMUSCULAR | Status: AC
  Administered 2021-01-17: 30 mg via INTRAVENOUS
  Filled 2021-01-17: qty 1

## 2021-01-17 MED ORDER — SODIUM CHLORIDE 0.9 % IV SOLN: 2.0000 g | INTRAVENOUS | Status: DC

## 2021-01-17 MED ORDER — POTASSIUM CHLORIDE CRYS ER 20 MEQ PO TBCR
40.0000 meq | EXTENDED_RELEASE_TABLET | Freq: Four times a day (QID) | ORAL | Status: AC
  Administered 2021-01-17 – 2021-01-18 (×2): 40 meq via ORAL
  Filled 2021-01-17 (×2): qty 2

## 2021-01-17 MED ORDER — ONDANSETRON HCL 4 MG/2ML IJ SOLN
4.0000 mg | Freq: Once | INTRAMUSCULAR | Status: AC
  Administered 2021-01-17: 4 mg via INTRAVENOUS
  Filled 2021-01-17: qty 2

## 2021-01-17 MED ORDER — ACETAMINOPHEN 500 MG PO TABS
1000.0000 mg | ORAL_TABLET | Freq: Four times a day (QID) | ORAL | Status: DC | PRN
  Administered 2021-01-17 – 2021-01-18 (×2): 1000 mg via ORAL
  Filled 2021-01-17 (×2): qty 2

## 2021-01-17 MED ORDER — ONDANSETRON HCL 4 MG PO TABS: 4.0000 mg | ORAL_TABLET | Freq: Four times a day (QID) | ORAL | Status: DC | PRN

## 2021-01-17 MED ORDER — METOPROLOL SUCCINATE ER 50 MG PO TB24
50.0000 mg | ORAL_TABLET | Freq: Every day | ORAL | Status: DC
  Administered 2021-01-18 – 2021-01-20 (×3): 50 mg via ORAL
  Filled 2021-01-17 (×3): qty 1

## 2021-01-17 MED ORDER — LACTATED RINGERS IV BOLUS
2000.0000 mL | Freq: Once | INTRAVENOUS | Status: AC
  Administered 2021-01-17: 2000 mL via INTRAVENOUS

## 2021-01-17 MED ORDER — OXYCODONE HCL 5 MG PO TABS
5.0000 mg | ORAL_TABLET | ORAL | Status: DC | PRN
Start: 2021-01-17 — End: 2021-01-20
  Administered 2021-01-17 – 2021-01-19 (×4): 5 mg via ORAL
  Filled 2021-01-17 (×4): qty 1

## 2021-01-17 MED ORDER — VANCOMYCIN HCL 2000 MG/400ML IV SOLN
2000.0000 mg | Freq: Once | INTRAVENOUS | Status: AC
  Administered 2021-01-17: 2000 mg via INTRAVENOUS
  Filled 2021-01-17: qty 400

## 2021-01-17 MED ORDER — INSULIN ASPART 100 UNIT/ML IJ SOLN
0.0000 [IU] | Freq: Three times a day (TID) | INTRAMUSCULAR | Status: DC
  Administered 2021-01-17 (×2): 4 [IU] via SUBCUTANEOUS
  Administered 2021-01-17 – 2021-01-18 (×2): 3 [IU] via SUBCUTANEOUS
  Filled 2021-01-17 (×2): qty 1

## 2021-01-17 MED ORDER — ONDANSETRON HCL 4 MG/2ML IJ SOLN: 4.0000 mg | Freq: Four times a day (QID) | INTRAMUSCULAR | Status: DC | PRN

## 2021-01-17 MED ORDER — SENNOSIDES-DOCUSATE SODIUM 8.6-50 MG PO TABS: 2.0000 | ORAL_TABLET | Freq: Every day | ORAL | Status: DC | PRN

## 2021-01-17 MED ORDER — MAGNESIUM SULFATE 4 GM/100ML IV SOLN
4.0000 g | Freq: Once | INTRAVENOUS | Status: AC
  Administered 2021-01-17: 4 g via INTRAVENOUS
  Filled 2021-01-17: qty 100

## 2021-01-17 MED ORDER — ENOXAPARIN SODIUM 40 MG/0.4ML IJ SOSY: 40.0000 mg | PREFILLED_SYRINGE | Freq: Two times a day (BID) | INTRAMUSCULAR | Status: DC

## 2021-01-17 MED ORDER — HYDROMORPHONE HCL 1 MG/ML IJ SOLN
1.0000 mg | Freq: Once | INTRAMUSCULAR | Status: AC
Start: 2021-01-17 — End: 2021-01-17
  Administered 2021-01-17: 1 mg via INTRAVENOUS
  Filled 2021-01-17: qty 1

## 2021-01-17 MED ORDER — LACTATED RINGERS IV BOLUS
1000.0000 mL | Freq: Once | INTRAVENOUS | Status: AC
  Administered 2021-01-17: 1000 mL via INTRAVENOUS

## 2021-01-17 MED ORDER — ONDANSETRON HCL 4 MG PO TABS
8.0000 mg | ORAL_TABLET | Freq: Four times a day (QID) | ORAL | Status: DC | PRN
  Administered 2021-01-17 (×2): 8 mg via ORAL
  Filled 2021-01-17 (×2): qty 2

## 2021-01-17 MED ORDER — INSULIN GLARGINE 100 UNIT/ML ~~LOC~~ SOLN
50.0000 [IU] | Freq: Every day | SUBCUTANEOUS | Status: DC
  Administered 2021-01-17 – 2021-01-19 (×3): 50 [IU] via SUBCUTANEOUS
  Filled 2021-01-17 (×5): qty 0.5

## 2021-01-17 MED ORDER — INSULIN ASPART 100 UNIT/ML IJ SOLN
10.0000 [IU] | Freq: Three times a day (TID) | INTRAMUSCULAR | Status: DC
  Administered 2021-01-17 – 2021-01-20 (×10): 10 [IU] via SUBCUTANEOUS
  Filled 2021-01-17: qty 1

## 2021-01-17 NOTE — ED Provider Notes (Signed)
AP-EMERGENCY DEPT [facility] Emergency Department Provider Note MRN:  [id number]  Arrival date & time: 01/17/21     Chief Complaint   Fever   History of Present Illness   Brittany [name] is a 40 y.o. year-old female with a history of morbid obesity, diabetes presenting to the ED with chief complaint of leg pain.  Pain to the left leg with redness, swelling.  Fever today.  Feeling general malaise, weakness.  Gets cellulitis in this leg once a year.  Review of Systems  Positive for leg pain, fever. Patient's Health History    Past Medical History:  Diagnosis Date   Anxiety    Carpal tunnel syndrome on left 10/12/2016   Cellulitis and abscess of leg    Chronic abdominal pain    Depression    Diabetes mellitus without complication (HCC)    Gallstones    Gastroparesis    GERD (gastroesophageal reflux disease) 08/13/2017   Headache    Hypothyroid 01/03/2018   Lymphedema    Sleep apnea    DX with sleep apnea - unable to use c-pap   Umbilical hernia     Past Surgical History:  Procedure Laterality Date   BIOPSY  11/11/2019   Procedure: BIOPSY;  Surgeon: Tressia Danas, MD;  Location: [facility];  Service: Gastroenterology;;   CESAREAN SECTION  11/09/01   ESOPHAGOGASTRODUODENOSCOPY (EGD) WITH PROPOFOL N/A 11/11/2019   Procedure: ESOPHAGOGASTRODUODENOSCOPY (EGD) WITH PROPOFOL;  Surgeon: Tressia Danas, MD;  Location: [facility];  Service: Gastroenterology;  Laterality: N/A;   HERNIA REPAIR     INSERTION OF MESH N/A 01/02/2013   Procedure: INSERTION OF MESH;  Surgeon: Lodema Pilot, DO;  Location: [facility];  Service: General;  Laterality: N/A;   UMBILICAL HERNIA REPAIR N/A 01/02/2013   Procedure: LAPAROSCOPIC UMBILICAL HERNIA;  Surgeon: Lodema Pilot, DO;  Location: [facility];  Service: General;  Laterality: N/A;    Family History  Problem Relation Age of Onset   Cancer Mother        leukemia   Diabetes Mother    Hypertension Mother    Hyperlipidemia Mother    Other  Father        Sciatica, kidney stones   Alcohol abuse Paternal Grandfather    Other Paternal Grandmother        bowel obstruction; pneumonia   Hypertension Maternal Grandmother    Diabetes Maternal Grandmother    Cancer Maternal Grandfather        lung   ADD / ADHD Daughter    Depression Daughter    Anxiety disorder Daughter     Social History   Socioeconomic History   Marital status: Married    Spouse name: Not on file   Number of children: 1   Years of education: 11   Highest education level: Not on file  Occupational History   Occupation: N/A  Tobacco Use   Smoking status: Never   Smokeless tobacco: Never  Vaping Use   Vaping Use: Never used  Substance and Sexual Activity   Alcohol use: No   Drug use: No   Sexual activity: Yes    Birth control/protection: None    Comment: husband  Other Topics Concern   Not on file  Social History Narrative   Lives at home w/ her husband and daughter   Right-handed   Caffeine daily   Social Determinants of Health   Financial Resource Strain: Not on file  Food Insecurity: Not on file  Transportation Needs: Not on file  Physical Activity: Not on file  Stress: Not on file  Social Connections: Not on file  Intimate Partner Violence: Not on file     Physical Exam   Vitals:   01/17/21 0025 01/17/21 0030  BP: 120/87 (!) 118/97  Pulse: (!) 132 (!) 129  Resp: 20 20  Temp:    SpO2: 98% 99%    CONSTITUTIONAL: Chronically ill-appearing, NAD, obese NEURO:  Alert and oriented x 3, no focal deficits EYES:  eyes equal and reactive ENT/NECK:  no LAD, no JVD CARDIO: Tachycardic rate, well-perfused, normal S1 and S2 PULM:  CTAB no wheezing or rhonchi GI/GU:  normal bowel sounds, non-distended, non-tender MSK/SPINE:  No gross deformities, no edema SKIN: Erythema and induration to the left lower extremity PSYCH:  Appropriate speech and behavior  *Additional and/or pertinent findings included in MDM below  Diagnostic and  Interventional Summary    EKG Interpretation  Date/Time:    Ventricular Rate:    PR Interval:    QRS Duration:   QT Interval:    QTC Calculation:   R Axis:     Text Interpretation:          Labs Reviewed  LACTIC ACID, PLASMA - Abnormal; Notable for the following components:      Result Value   Lactic Acid, Venous 3.0 (*)    All other components within normal limits  COMPREHENSIVE METABOLIC PANEL - Abnormal; Notable for the following components:   Sodium 133 (*)    Glucose, Bld 173 (*)    Creatinine, Ser 1.03 (*)    Total Protein 8.6 (*)    All other components within normal limits  CBC WITH DIFFERENTIAL/PLATELET - Abnormal; Notable for the following components:   WBC 16.2 (*)    RBC 5.23 (*)    HCT 47.3 (*)    Neutro Abs 15.0 (*)    Lymphs Abs 0.5 (*)    Abs Immature Granulocytes 0.11 (*)    All other components within normal limits  RESP PANEL BY RT-PCR (FLU A&B, COVID) ARPGX2  CULTURE, BLOOD (ROUTINE X 2)  CULTURE, BLOOD (ROUTINE X 2)  URINE CULTURE  PROTIME-INR  APTT  LACTIC ACID, PLASMA  URINALYSIS, ROUTINE W REFLEX MICROSCOPIC  POC URINE PREG, ED    DG Chest 2 View  Final Result      Medications  lactated ringers bolus 1,000 mL (1,000 mLs Intravenous New Bag/Given 01/17/21 0047)    And  lactated ringers bolus 1,000 mL (1,000 mLs Intravenous New Bag/Given 01/17/21 0047)    And  lactated ringers bolus 400 mL (400 mLs Intravenous New Bag/Given 01/17/21 0048)  HYDROmorphone (DILAUDID) injection 1 mg (has no administration in time range)  ondansetron (ZOFRAN) injection 4 mg (has no administration in time range)  cefTRIAXone (ROCEPHIN) 2 g in sodium chloride 0.9 % 100 mL IVPB (0 g Intravenous Stopped 01/17/21 0047)     Procedures  /  Critical Care .Critical Care  Date/Time: 01/17/2021 1:00 AM Performed by: Sabas Sous, MD Authorized by: Sabas Sous, MD   Critical care provider statement:    Critical care time (minutes):  45   Critical care was  time spent personally by me on the following activities:  Discussions with consultants, evaluation of patient's response to treatment, examination of patient, ordering and performing treatments and interventions, ordering and review of laboratory studies, ordering and review of radiographic studies, pulse oximetry, re-evaluation of patient's condition, obtaining history from patient or surrogate and review of old charts  ED Course  and Medical Decision Making  I have reviewed the triage vital signs, the nursing notes, and pertinent available records from the EMR.  Listed above are laboratory and imaging tests that I personally ordered, reviewed, and interpreted and then considered in my medical decision making (see below for details).  Fever with source of infection likely cellulitis of the left lower extremity.  Patient is tachycardic, febrile, leukocytosis, elevated lactate, suggestive of systemic infection or sepsis.  Code sepsis initiated, providing ceftriaxone, vancomycin, IV fluids 30 cc/kg ideal body weight.  Will admit to medicine.       Elmer Sow. Pilar Plate, MD [facility] Emergency Medicine [facility] [email or web address]  Final Clinical Impressions(s) / ED Diagnoses     ICD-10-CM   1. Sepsis, due to unspecified organism, unspecified whether acute organ dysfunction present [facility])  A41.9       ED Discharge Orders     None        Discharge Instructions Discussed with and Provided to Patient:   Discharge Instructions   None       Sabas Sous, MD 01/17/21 0100

## 2021-01-17 NOTE — Progress Notes (Signed)
[facility] night shift stepdown coverage note.  The patient was seen after the nursing staff reported that she was having SVT with a heart rate in the 160s and 170s.  She was asymptomatic without chest pain, palpitations, lightheadedness, dyspnea or diaphoresis.  She denied having any previous history of these episodes.  She denied having palpitations or fluttering sensation at home in the past few months or even before that.  She stated that she has not taken her levothyroxine in years.   Her vital signs along with 98.4, pulse 163, respiration 18 and BP 102/71 mmHg.  General: No acute distress. Neck: normal, supple, no masses, no thyromegaly.  Respiratory: clear to auscultation bilaterally, no wheezing, no crackles. Normal respiratory effort. No accessory muscle use. Cardiovascular: Tachycardic in the 160s and 170s with a regular rhythm, no murmurs / rubs / gallops.  Stage III lymphedema.  No extremity pitting edema. 2+ pedal pulses. No carotid bruits. Abdomen: Obese, no distention.  Bowel sounds positive.  Positive for gastric tenderness, no guarding or rebound tenderness, no masses palpated. No hepatosplenomegaly.  Musculoskeletal: Mild generalized weakness.  No clubbing / cyanosis. Good ROM, no contractures. Normal muscle tone. Skin: LLE erythema, edema, calor and TTP.   EKG #1 Vent. rate 168 BPM PR interval * ms QRS duration 82 ms QT/QTcB 276/461 ms P-R-T axes * 57 270 Supraventricular tachycardia Low voltage QRS Nonspecific ST and T wave abnormality  EKG #2 Vent. rate 90 BPM PR interval 196 ms QRS duration 90 ms QT/QTcB 358/437 ms P-R-T axes 60 49 38 Normal sinus rhythm Low voltage QRS Cannot rule out Anterior infarct , age undetermined Abnormal ECG  Troponin I (High Sensitivity) [[id number]] (Abnormal)   Collected: 01/17/21 2106   Updated: 01/17/21 2158    Troponin I (High Sensitivity) 42 High  ng/L  Magnesium [[id number]] (Abnormal)   Collected: 01/17/21 2106   Updated:  01/17/21 2158   Specimen Type: Blood    Magnesium 1.5 Low  mg/dL  Basic metabolic panel [[id number]] (Abnormal)   Collected: 01/17/21 2106   Updated: 01/17/21 2158   Specimen Type: Blood    Sodium 132 Low  mmol/L   Potassium 3.4 Low  mmol/L   Chloride 104 mmol/L   CO2 22 mmol/L   Glucose, Bld 92 mg/dL   BUN 19 mg/dL   Creatinine, Ser 3.38 High  mg/dL   Calcium 8.0 Low  mg/dL   GFR, Estimated >25 mL/min   Anion gap 6  Brain natriuretic peptide [[id number]] (Abnormal)   Collected: 01/17/21 2107   Updated: 01/17/21 2152   Specimen Type: Blood    B Natriuretic Peptide 119.0 High  pg/mL  Hemoglobin and hematocrit, blood [[id number]] (Abnormal)   Collected: 01/17/21 2107   Updated: 01/17/21 2121   Specimen Type: Blood    Hemoglobin 11.8 Low  g/dL   HCT 97.3 %    Component Value Units  Troponin I (High Sensitivity) [[id number]] (Abnormal)   Collected: 01/17/21 2241   Updated: 01/17/21 2328    Troponin I (High Sensitivity) 47 High  ng/L  TSH [[id number]]   Collected: 01/17/21 2107   Updated: 01/17/21 2202   Specimen Type: Blood    TSH 3.676 uIU/mL   Assessment/plan:  Supraventricular tachycardia Resolve after receiving 5 mg of metoprolol IVP and a 1000 mL LR bolus over 1 hour. Acid her heart rate was trending down while in the 120s and 130s the monitor briefly showed atrial fibrillation with RVR, but looking the monitor tracing it seemed like he was sinus tachycardia  with a sinus arrhythmia.  However, this only lasted for less than a minute.  Her heart rate subsequently became sinus rhythm in the 80s and 90s.  An echocardiogram will be obtained.  We have consulted cardiology for the morning, the staff will save the tracing and EKGs so cardiology can evaluate them.  I have started metoprolol succinate 50 mg p.o. daily to start tomorrow morning.  We will be giving an initial dose of 25 mg tonight.  Metoprolol titrate 5 mg IVP every 6 hours as needed for heart rate greater than 105  bpm.  Elevated troponin Asymptomatic demand ischemia in the setting of tachycardia.  Hypokalemia KCl 40 mEq p.o. every 6 hours x2 doses. Potassium level will be followed in a.m.  Hypomagnesemia Magnesium sulfate 4 g IVPB ordered. Will defer magnesium level measurement in the morning.  Hyponatremia Pseudohyponatremia secondary to hyperglycemia.  Sanda Klein, MD.  About 45 minutes of critical care time were spent during the process of this urgent event.  This document was prepared using Dragon voice recognition software and may contain some unintended transcription errors.

## 2021-01-17 NOTE — Progress Notes (Signed)
Incorrect time, assessment time at 2030 on 7/10  01/17/21 0830  Pain Assessment  Pain Scale 0-10  Pain Score 7  Pain Type Acute pain  Pain Location Leg  Pain Orientation Left

## 2021-01-17 NOTE — ED Notes (Signed)
Blood cultures were obtained prior to starting antibiotic.

## 2021-01-17 NOTE — Progress Notes (Signed)
Pharmacy Antibiotic Note  Brittany [name] is a 40 y.o. female admitted on 01/16/2021 with cellulitis with high risk of MRSA.  Pharmacy has been consulted for vancomycin dosing.  Plan: Vancomycin 2000mg  x1 then 1500mg  IV Q8H. Goal AUC 400-550.  Expected AUC 500.  SCr 1.06.   Height: 6' (182.9 cm) Weight: (!) 250.8 kg (553 lb) IBW/kg (Calculated) : 73.1  Temp (24hrs), Avg:100.4 F (38 C), Min:99.1 F (37.3 C), Max:101.9 F (38.8 C)  Recent Labs  Lab 01/16/21 2311 01/16/21 2346 01/17/21 0457  WBC 16.2*  --   --   CREATININE 1.03*  --  1.06*  LATICACIDVEN 3.0* 2.6*  --     Estimated Creatinine Clearance: 157.4 mL/min (A) (by C-G formula based on SCr of 1.06 mg/dL (H)).    Allergies  Allergen Reactions   Paxil [Paroxetine Hcl]     Suicidal thoughts   Morphine And Related Other (See Comments)    "it just freaks me out"   Latex Itching, Swelling and Rash   Sulfa Antibiotics Nausea And Vomiting and Rash     Thank you for allowing pharmacy to be a part of this patient's care.  03/19/21, PharmD, BCPS  01/17/2021 7:07 AM

## 2021-01-17 NOTE — H&P (Signed)
History and Physical    Brittany [name] QMG:[id number] DOB: [birth year] DOA: 01/16/2021  PCP: [name], [facility]  Patient coming from: Home.  I have personally briefly reviewed patient's old medical records in [facility]  Chief Complaint: Chills, fever and LLE pain.  HPI: Brittany [name] is a 40 y.o. female with medical history significant of anxiety, left carpal tunnel syndrome, chronic abdominal pain, gastroparesis, GERD, depression, type II DM, cholelithiasis, headaches, hypothyroidism, sleep apnea not on CPAP, lymphedema, history of recurrent cellulitis of the LLE who is coming to the emergency department with complaints of chills, fever over 101.4 F at home associated with edema, erythema, calor and TTP of left inner thigh area typical of her previous cellulitis episodes in the same area.  She also had generalized headache and 2 episodes of emesis on the way to the hospital.  She stated that she gets an episode almost yearly.  She denied rhinorrhea, sore throat, wheezing, cough, hemoptysis, dyspnea, chest pain, palpitations, diaphoresis, PND or orthopnea.  No diarrhea, occasional constipation, no melena or hematochezia.  Denied dysuria, frequency or hematuria.  No polyuria, polydipsia or blurred vision.  ED Course: Initial vital signs were temperature 100.3 F, pulse 128, respirations 24, BP 120/87 mmHg and O2 sat 100% on room air.  The patient received 2400 mL of LR bolus, ondansetron 4 mg IVP, hydromorphone 1 mg IVP, ceftriaxone and vancomycin.  I added another 2000 mL of LR bolus.  Lab work: Her urinalysis shows small hemoglobinuria and rare bacteria.  CBC showed a white count of 16.2, hemoglobin 14.8 g/dL and platelets 891.  She had a normal coagulation panel.  Her lactic acid initially was 3.0 and follow-up was 2.6 mmol/L.  CMP showed normal electrolytes.  Glucose 173 and creatinine 1.03 mg/dL.  Total protein was 8.6 g/dL.  BUN and the rest of the hepatic functions are within  normal range.  Coronavirus and influenza PCR was negative.  Imaging: A 2 view chest radiograph showed questionable mild airways thickening.  These could be seen with bronchitis or reactive airways disease.  Lungs were clear otherwise accounting for body habitus.  Please see images and full radiology report for further detail.  Review of Systems: As per HPI otherwise all other systems reviewed and are negative.  Past Medical History:  Diagnosis Date   Anxiety    Carpal tunnel syndrome on left 10/12/2016   Cellulitis and abscess of leg    Chronic abdominal pain    Depression    Diabetes mellitus without complication (HCC)    Gallstones    Gastroparesis    GERD (gastroesophageal reflux disease) 08/13/2017   Headache    Hypothyroid 01/03/2018   Lymphedema    Sleep apnea    DX with sleep apnea - unable to use c-pap   Umbilical hernia     Past Surgical History:  Procedure Laterality Date   BIOPSY  11/11/2019   Procedure: BIOPSY;  Surgeon: Tressia Danas, MD;  Location: [facility];  Service: Gastroenterology;;   CESAREAN SECTION  11/09/01   ESOPHAGOGASTRODUODENOSCOPY (EGD) WITH PROPOFOL N/A 11/11/2019   Procedure: ESOPHAGOGASTRODUODENOSCOPY (EGD) WITH PROPOFOL;  Surgeon: Tressia Danas, MD;  Location: [facility];  Service: Gastroenterology;  Laterality: N/A;   HERNIA REPAIR     INSERTION OF MESH N/A 01/02/2013   Procedure: INSERTION OF MESH;  Surgeon: Lodema Pilot, DO;  Location: [facility];  Service: General;  Laterality: N/A;   UMBILICAL HERNIA REPAIR N/A 01/02/2013   Procedure: LAPAROSCOPIC UMBILICAL HERNIA;  Surgeon:  Lodema Pilot, DO;  Location: [facility];  Service: General;  Laterality: N/A;    Social History  reports that she has never smoked. She has never used smokeless tobacco. She reports that she does not drink alcohol and does not use drugs.  Allergies  Allergen Reactions   Paxil [Paroxetine Hcl]     Suicidal thoughts   Morphine And Related Other (See Comments)    "it just  freaks me out"   Latex Itching, Swelling and Rash   Sulfa Antibiotics Nausea And Vomiting and Rash    Family History  Problem Relation Age of Onset   Cancer Mother        leukemia   Diabetes Mother    Hypertension Mother    Hyperlipidemia Mother    Other Father        Sciatica, kidney stones   Alcohol abuse Paternal Grandfather    Other Paternal Grandmother        bowel obstruction; pneumonia   Hypertension Maternal Grandmother    Diabetes Maternal Grandmother    Cancer Maternal Grandfather        lung   ADD / ADHD Daughter    Depression Daughter    Anxiety disorder Daughter    Prior to Admission medications   Medication Sig Start Date End Date Taking? Authorizing Provider  insulin aspart (NOVOLOG) 100 UNIT/ML injection SLIDING SCALE 3 TIMES DAILY WITH MEALS: For blood sugar 70-120-no insulin. Blood sugar 121-150 take 5 units. Blood sugar 151-208 take 8 units. Blood sugar 201-250 take 12 units. Blood sugar 251-300 take 17 units. Blood sugar 301-350 take 20 units. Blood sugar 351-400 take 25 units. Blood sugar greater than 400 take 28 units and call your doctor. Patient taking differently: Inject 8-24 Units into the skin See admin instructions. Inject 8 to 24 units up to 3 times a day with meals 03/27/16  Yes Devoria Albe, MD  insulin glargine (LANTUS) 100 UNIT/ML injection Inject 0.65 mLs (65 Units total) into the skin at bedtime. Patient taking differently: Inject 70 Units into the skin at bedtime. 08/13/17  Yes Dhungel, Nishant, MD  ondansetron (ZOFRAN-ODT) 8 MG disintegrating tablet Take 8 mg by mouth every 8 (eight) hours as needed for nausea or vomiting.   Yes [provider]  SSD 1 % cream Apply 1 application topically daily as needed (rash/burns).  09/19/19  Yes [provider]  VICTOZA 18 MG/3ML SOPN Inject 1.8 mg into the skin at bedtime. 07/11/19  Yes [provider]   Physical Exam: Vitals:   01/17/21 0130 01/17/21 0200 01/17/21 0300 01/17/21 0328   BP: 128/80 120/69 (!) 102/51 (!) 110/56  Pulse: (!) 120 (!) 116 (!) 123 (!) 115  Resp: 18 (!) 29 (!) 23 18  Temp: 99.1 F (37.3 C)     TempSrc: Oral     SpO2: 99% 90% 97% 95%  Weight:      Height:       Constitutional: NAD, calm, comfortable Eyes: PERRL, lids and conjunctivae normal ENMT: Mucous membranes are moist. Posterior pharynx clear of any exudate or lesions. Neck: normal, supple, no masses, no thyromegaly Respiratory: clear to auscultation bilaterally, no wheezing, no crackles. Normal respiratory effort. No accessory muscle use.  Cardiovascular: Tachycardic in the 110s with a regular rhythm, no murmurs / rubs / gallops.  Stage III lymphedema.  No extremity pitting edema. 2+ pedal pulses. No carotid bruits.  Abdomen: Obese, no distention.  Bowel sounds positive.  Positive for gastric tenderness, no guarding or rebound tenderness,  no masses palpated. No hepatosplenomegaly. Musculoskeletal: Mild generalized weakness.  No clubbing / cyanosis. Good ROM, no contractures. Normal muscle tone.  Skin: LLE erythema, edema, calor and TTP.  Please see pictures below. Neurologic: CN 2-12 grossly intact. Sensation intact, DTR normal. Strength 5/5 in all 4.  Psychiatric: Normal judgment and insight. Alert and oriented x 3. Normal mood.          Labs on Admission: I have personally reviewed following labs and imaging studies  CBC: Recent Labs  Lab 01/16/21 2311  WBC 16.2*  NEUTROABS 15.0*  HGB 14.8  HCT 47.3*  MCV 90.4  PLT 212    Basic Metabolic Panel: Recent Labs  Lab 01/16/21 2311  NA 133*  K 4.1  CL 100  CO2 26  GLUCOSE 173*  BUN 14  CREATININE 1.03*  CALCIUM 8.9    GFR: Estimated Creatinine Clearance: 162 mL/min (A) (by C-G formula based on SCr of 1.03 mg/dL (H)).  Liver Function Tests: Recent Labs  Lab 01/16/21 2311  AST 23  ALT 25  ALKPHOS 94  BILITOT 1.0  PROT 8.6*  ALBUMIN 3.9    Urine analysis:    Component Value Date/Time   COLORURINE  YELLOW 01/17/2021 0043   APPEARANCEUR CLEAR 01/17/2021 0043   LABSPEC 1.019 01/17/2021 0043   PHURINE 5.0 01/17/2021 0043   GLUCOSEU NEGATIVE 01/17/2021 0043   HGBUR SMALL (A) 01/17/2021 0043   BILIRUBINUR NEGATIVE 01/17/2021 0043   KETONESUR NEGATIVE 01/17/2021 0043   PROTEINUR NEGATIVE 01/17/2021 0043   UROBILINOGEN 0.2 02/22/2013 0414   NITRITE NEGATIVE 01/17/2021 0043   LEUKOCYTESUR NEGATIVE 01/17/2021 0043    Radiological Exams on Admission: DG Chest 2 View  Result Date: 01/16/2021 CLINICAL DATA:  Chills and fever EXAM: CHEST - 2 VIEW COMPARISON:  Radiograph 08/14/2019 FINDINGS: Some increased attenuation towards the lung bases is largely attributable to patient body habitus. Question some mild airways thickening. No consolidation, features of edema, pneumothorax, or effusion. Pulmonary vascularity is normally distributed. The cardiomediastinal contours are unremarkable. No acute osseous or soft tissue abnormality. IMPRESSION: Questionable mild airways thickening. Could be seen with bronchitis or reactive airways disease. Lungs are otherwise clear accounting for body habitus. Electronically Signed   By: Kreg ShropshirePrice  DeHay M.D.   On: 01/16/2021 23:20    EKG: Independently reviewed.   Assessment/Plan Principal Problem:   Sepsis due to cellulitis POA (HCC) Observation/stepdown. Continue IV fluids. Analgesics as needed. Antiemetics as needed. Continue ceftriaxone 2 g IVPB every 24 hours. Continue vancomycin per pharmacy. Check lower extremities Doppler. Follow-up blood culture and sensitivity. Monitor CBC, renal function electrolytes.  Active Problems:   Sleep apnea Not on CPAP at home. Declined to use one of our devices.    GERD (gastroesophageal reflux disease) Famotidine 20 mg p.o. twice daily while in the hospital.    Uncontrolled type 2 diabetes mellitus with hyperglycemia,     with long-term current use of insulin (HCC)    Hypothyroid Not on levothyroxine. Will check  TSH.    Lymphedema Monitor for cellulitic changes.    Class 3 obesity Needs significant lifestyle modifications. Follow-up with PCP as an outpatient.    Gastroparesis No longer on metoclopramide. Continue ondansetron 8 mg tablets/injection as needed.    DVT prophylaxis: Lovenox SQ. Code Status:   Full code. Family Communication:   Disposition Plan:   Patient is from:  Home.  Anticipated DC to:  Home.  Anticipated DC date:  01/19/2021.  Anticipated DC barriers: Clinical status.  Consults called:   Admission  status:  Observation/stepdown.   Severity of Illness: High severity after presenting with fever, tachycardia, tachypnea, LLE pain typical of previous cellulitis episodes.  The patient will need to remain in the hospital to continue IV hydration and IV antibiotic therapy for at least 48 hours.  Bobette Mo MD [facility]  How to contact the [facility] Attending or Consulting provider 7A - 7P or covering provider during after hours 7P -7A, for this patient?   Check the care team in [facility] and look for a) attending/consulting TRH provider listed and b) the [facility] team listed Log into [email or web address] and use [place]'s universal password to access. If you do not have the password, please contact the hospital operator. Locate the [facility] provider you are looking for under [facility] and page to a number that you can be directly reached. If you still have difficulty reaching the provider, please page the [facility] (Director on Call) for the Hospitalists listed on amion for assistance.  01/17/2021, 4:27 AM   This document was prepared using Dragon voice recognition software and may contain some unintended transcription errors.

## 2021-01-17 NOTE — ED Notes (Signed)
[employer] ate 50% of breakfast

## 2021-01-17 NOTE — ED Notes (Signed)
US at bedside

## 2021-01-17 NOTE — Progress Notes (Signed)
PROGRESS NOTE   Brittany [name]  FGH:[id number] DOB: 1980-11-02 DOA: 01/16/2021 PCP: [name], [facility]   Chief Complaint  Patient presents with   Fever   Level of care: Stepdown  Brief Admission History:  40 y.o. female with medical history significant of anxiety, left carpal tunnel syndrome, chronic abdominal pain, gastroparesis, GERD, depression, type II DM, cholelithiasis, headaches, hypothyroidism, sleep apnea not on CPAP, lymphedema, history of recurrent cellulitis of the LLE who is coming to the emergency department with complaints of chills, fever over 101.4 F at home associated with edema, erythema, calor and TTP of left inner thigh area typical of her previous cellulitis episodes in the same area.  She also had generalized headache and 2 episodes of emesis on the way to the hospital.  She stated that she gets an episode almost yearly.  She denied rhinorrhea, sore throat, wheezing, cough, hemoptysis, dyspnea, chest pain, palpitations, diaphoresis, PND or orthopnea.  No diarrhea, occasional constipation, no melena or hematochezia.  Denied dysuria, frequency or hematuria.  No polyuria, polydipsia or blurred vision.   ED Course: Initial vital signs were temperature 100.3 F, pulse 128, respirations 24, BP 120/87 mmHg and O2 sat 100% on room air.  The patient received 2400 mL of LR bolus, ondansetron 4 mg IVP, hydromorphone 1 mg IVP, ceftriaxone and vancomycin.  I added another 2000 mL of LR bolus.   Lab work: Her urinalysis shows small hemoglobinuria and rare bacteria.  CBC showed a white count of 16.2, hemoglobin 14.8 g/dL and platelets 678.  She had a normal coagulation panel.  Her lactic acid initially was 3.0 and follow-up was 2.6 mmol/L.  CMP showed normal electrolytes.  Glucose 173 and creatinine 1.03 mg/dL.  Total protein was 8.6 g/dL.  BUN and the rest of the hepatic functions are within normal range.  Coronavirus and influenza PCR was negative.   Imaging: A 2 view chest  radiograph showed questionable mild airways thickening.  These could be seen with bronchitis or reactive airways disease.  Lungs were clear otherwise accounting for body habitus.  Please see images and full radiology report for further detail.   Assessment & Plan:   Principal Problem:   Sepsis due to cellulitis [facility]) Active Problems:   Sleep apnea   Sepsis (HCC)   GERD (gastroesophageal reflux disease)   Uncontrolled type 2 diabetes mellitus with hyperglycemia, with long-term current use of insulin (HCC)   Hypothyroid   Lymphedema   Class 3 obesity   Gastroparesis  Severe sepsis secondary to cellulitis of left leg - Pt admitted to stepdown ICU.  IV fluid resuscitation has been completed.  Continue IV fluids, follow and trend lactic acid.  IV antibiotics.  Supportive measures, cardiac monitoring.    Cellulitis of left lower leg - continue high dose ceftriaxone and vancomycin, follow blood cultures, continue supportive measures and local skin care.    Hypothyroidism - reported by history and not on hormone supplement, TSH is pending.   Type 2 diabetes mellitus with neurological complications - check A1c, basal bolus insulin with supplemental SSI coverage and frequent CBG testing ordered.    Chronic lower extremity lymphedema   Morbid obesity - ordered a bariatric bed for inpatient stay.    History of gastroparesis - currently stable and asymptomatic.    DVT prophylaxis: high dose enoxaparin  Code Status: full  Family Communication: son at bedside  Disposition: anticipating home  Status is: Inpatient  Remains inpatient appropriate because:IV treatments appropriate due to intensity of illness or  inability to take PO and Inpatient level of care appropriate due to severity of illness  Dispo: The patient is from: Home              Anticipated d/c is to: Home              Patient currently is not medically stable to d/c.   Difficult to place patient No  Consultants:     Procedures:    Antimicrobials:  Ceftriaxone 7/10>> Vancomycin 7/10>>   Subjective: Pt reports that she has painful left lower leg but denies chills. She is having fever.    Objective: Vitals:   01/17/21 1030 01/17/21 1045 01/17/21 1100 01/17/21 1117  BP:    98/68  Pulse: 98 87 89 89  Resp: (!) 32 (!) 22 (!) 7 12  Temp:    97.9 F (36.6 C)  TempSrc:    Oral  SpO2: 96% 93% 100% 97%  Weight:      Height:        Intake/Output Summary (Last 24 hours) at 01/17/2021 1233 Last data filed at 01/17/2021 [id number] Gross per 24 hour  Intake 4400 ml  Output --  Net 4400 ml   Filed Weights   01/16/21 2222  Weight: (!) 250.8 kg    Examination:  General exam: morbidly obese female, awake, alert, oriented x 3.  Appears calm and comfortable  Respiratory system: Clear to auscultation. Respiratory effort normal. Cardiovascular system: normal S1 & S2 heard. No JVD, murmurs, rubs, gallops or clicks. No pedal edema. Gastrointestinal system: Abdomen is nondistended, soft and nontender. No organomegaly or masses felt. Normal bowel sounds heard. Central nervous system: Alert and oriented. No focal neurological deficits. Extremities: Symmetric 5 x 5 power. Skin: left lower leg with cracked open areas and surrounding erythema and warmth consistent with proximally spreading cellulitis Psychiatry: Judgement and insight appear normal. Mood & affect appropriate.   Data Reviewed: I have personally reviewed following labs and imaging studies  CBC: Recent Labs  Lab 01/16/21 2311 01/17/21 0732  WBC 16.2* 22.7*  NEUTROABS 15.0*  --   HGB 14.8 12.3  HCT 47.3* 38.9  MCV 90.4 90.3  PLT 212 223    Basic Metabolic Panel: Recent Labs  Lab 01/16/21 2311 01/17/21 0457 01/17/21 0732  NA 133* 135 133*  K 4.1 4.2 4.1  CL 100 104 102  CO2 26 23 23   GLUCOSE 173* 209* 211*  BUN 14 15 17   CREATININE 1.03* 1.06* 1.13*  CALCIUM 8.9 8.2* 8.2*    GFR: Estimated Creatinine Clearance: 147.6  mL/min (A) (by C-G formula based on SCr of 1.13 mg/dL (H)).  Liver Function Tests: Recent Labs  Lab 01/16/21 2311  AST 23  ALT 25  ALKPHOS 94  BILITOT 1.0  PROT 8.6*  ALBUMIN 3.9    CBG: Recent Labs  Lab 01/17/21 0738  GLUCAP 196*    Recent Results (from the past 240 hour(s))  Resp Panel by RT-PCR (Flu A&B, Covid) Nasopharyngeal Swab     Status: None   Collection Time: 01/16/21 11:46 PM   Specimen: Nasopharyngeal Swab; Nasopharyngeal(NP) swabs in vial transport medium  Result Value Ref Range Status   SARS Coronavirus 2 by RT PCR NEGATIVE NEGATIVE Final    Comment: (NOTE) SARS-CoV-2 target nucleic acids are NOT DETECTED.  The SARS-CoV-2 RNA is generally detectable in upper respiratory specimens during the acute phase of infection. The lowest concentration of SARS-CoV-2 viral copies this assay can detect is 138 copies/mL. A negative result does not  preclude SARS-Cov-2 infection and should not be used as the sole basis for treatment or other patient management decisions. A negative result may occur with  improper specimen collection/handling, submission of specimen other than nasopharyngeal swab, presence of viral mutation(s) within the areas targeted by this assay, and inadequate number of viral copies(<138 copies/mL). A negative result must be combined with clinical observations, patient history, and epidemiological information. The expected result is Negative.  Fact Sheet for Patients:  BloggerCourse.com  Fact Sheet for Healthcare Providers:  SeriousBroker.it  This test is no t yet approved or cleared by the [place] FDA and  has been authorized for detection and/or diagnosis of SARS-CoV-2 by FDA under an Emergency Use Authorization (EUA). This EUA will remain  in effect (meaning this test can be used) for the duration of the COVID-19 declaration under Section 564(b)(1) of the Act, 21 U.S.C.section  360bbb-3(b)(1), unless the authorization is terminated  or revoked sooner.       Influenza A by PCR NEGATIVE NEGATIVE Final   Influenza B by PCR NEGATIVE NEGATIVE Final    Comment: (NOTE) The Xpert Xpress SARS-CoV-2/FLU/RSV plus assay is intended as an aid in the diagnosis of influenza from Nasopharyngeal swab specimens and should not be used as a sole basis for treatment. Nasal washings and aspirates are unacceptable for Xpert Xpress SARS-CoV-2/FLU/RSV testing.  Fact Sheet for Patients: BloggerCourse.com  Fact Sheet for Healthcare Providers: SeriousBroker.it  This test is not yet approved or cleared by the [place] FDA and has been authorized for detection and/or diagnosis of SARS-CoV-2 by FDA under an Emergency Use Authorization (EUA). This EUA will remain in effect (meaning this test can be used) for the duration of the COVID-19 declaration under Section 564(b)(1) of the Act, 21 U.S.C. section 360bbb-3(b)(1), unless the authorization is terminated or revoked.  Performed at [facility], [address], [place], [place] [address]      Radiology Studies: DG Chest 2 View  Result Date: 01/16/2021 CLINICAL DATA:  Chills and fever EXAM: CHEST - 2 VIEW COMPARISON:  Radiograph 08/14/2019 FINDINGS: Some increased attenuation towards the lung bases is largely attributable to patient body habitus. Question some mild airways thickening. No consolidation, features of edema, pneumothorax, or effusion. Pulmonary vascularity is normally distributed. The cardiomediastinal contours are unremarkable. No acute osseous or soft tissue abnormality. IMPRESSION: Questionable mild airways thickening. Could be seen with bronchitis or reactive airways disease. Lungs are otherwise clear accounting for body habitus. Electronically Signed   By: Kreg Shropshire M.D.   On: 01/16/2021 23:20   US Venous Img Lower Bilateral (DVT)  Result Date:  01/17/2021 CLINICAL DATA:  Bilateral lower extremity cellulitis. Morbidly obese. EXAM: BILATERAL LOWER EXTREMITY VENOUS DOPPLER ULTRASOUND TECHNIQUE: Gray-scale sonography with compression, as well as color and duplex ultrasound, were performed to evaluate the deep venous system(s) from the level of the common femoral vein through the popliteal and proximal calf veins. COMPARISON:  None. FINDINGS: VENOUS Normal compressibility of the common femoral, superficial femoral, and popliteal veins, as well as the visualized calf veins. Visualized portions of profunda femoral vein and great saphenous vein unremarkable. No filling defects to suggest DVT on grayscale or color Doppler imaging. Doppler waveforms show normal direction of venous flow, normal respiratory plasticity and response to augmentation. Limited views of the contralateral common femoral vein are unremarkable. OTHER None. Limitations: Study is significantly limited due to body habitus and patient's inability to lie down due to shortness of breath. RIGHT calf veins not able to be seen. IMPRESSION:  No obvious evidence of DVT within either lower extremity, but with significant study limitations detailed above. Electronically Signed   By: Bary Richard M.D.   On: 01/17/2021 10:17    Scheduled Meds:  enoxaparin (LOVENOX) injection  120 mg Subcutaneous Q24H   insulin aspart  0-20 Units Subcutaneous TID WC   insulin aspart  0-5 Units Subcutaneous QHS   insulin aspart  10 Units Subcutaneous TID WC   insulin glargine  50 Units Subcutaneous Daily   saccharomyces boulardii  250 mg Oral BID   Continuous Infusions:  [START ON 01/18/2021] cefTRIAXone (ROCEPHIN)  IV     lactated ringers 150 mL/hr at 01/17/21 0640   ondansetron (ZOFRAN) IV     vancomycin 1,500 mg (01/17/21 0856)     LOS: 0 days   Time spent: 35 mins   Axavier Pressley Laural Benes, MD How to contact the [facility] Attending or Consulting provider 7A - 7P or covering provider during after hours 7P -7A, for  this patient?  Check the care team in [facility] and look for a) attending/consulting TRH provider listed and b) the [facility] team listed Log into [email or web address] and use [place]'s universal password to access. If you do not have the password, please contact the hospital operator. Locate the [facility] provider you are looking for under [facility] and page to a number that you can be directly reached. If you still have difficulty reaching the provider, please page the [facility] (Director on Call) for the Hospitalists listed on amion for assistance.  01/17/2021, 12:33 PM

## 2021-01-17 NOTE — ED Notes (Signed)
Pt is a difficult stick- [place] IV access being attempted

## 2021-01-17 NOTE — ED Notes (Signed)
Contraindicated to run LR and rocephin together- will start LR boluses after rocephin infusion is complete. Pt is hard stick, unable to obtain 2 IV's. [place] was used to access initial PIV. Dr Pilar Plate aware.

## 2021-01-17 NOTE — Progress Notes (Signed)
Heart monitor began to read heart rate of 150-170 sustained at approximately 2015. Pt asymptomatic. Obtained 12 lead EKG that read SVT. Educated pt on how to perform vagal maneuver, however attempt was unsuccessful. Dr. Robb Matar notified and orders received for medications and labs, see eMAR. Dr. Robb Matar to bedside to assess patient. Heart rate is now WNL. Will continue to monitor patient for changes.

## 2021-01-18 ENCOUNTER — Inpatient Hospital Stay (HOSPITAL_COMMUNITY): Payer: Medicaid Other

## 2021-01-18 DIAGNOSIS — I471 Supraventricular tachycardia: Secondary | ICD-10-CM

## 2021-01-18 DIAGNOSIS — G4730 Sleep apnea, unspecified: Secondary | ICD-10-CM

## 2021-01-18 LAB — CBC WITH DIFFERENTIAL/PLATELET
Abs Immature Granulocytes: 0.02 10*3/uL (ref 0.00–0.07)
Basophils Absolute: 0 10*3/uL (ref 0.0–0.1)
Basophils Relative: 0 %
Eosinophils Absolute: 0.1 10*3/uL (ref 0.0–0.5)
Eosinophils Relative: 1 %
HCT: 38.4 % (ref 36.0–46.0)
Hemoglobin: 11.5 g/dL — ABNORMAL LOW (ref 12.0–15.0)
Immature Granulocytes: 0 %
Lymphocytes Relative: 15 %
Lymphs Abs: 0.9 10*3/uL (ref 0.7–4.0)
MCH: 27.8 pg (ref 26.0–34.0)
MCHC: 29.9 g/dL — ABNORMAL LOW (ref 30.0–36.0)
MCV: 92.8 fL (ref 80.0–100.0)
Monocytes Absolute: 0.4 10*3/uL (ref 0.1–1.0)
Monocytes Relative: 7 %
Neutro Abs: 4.3 10*3/uL (ref 1.7–7.7)
Neutrophils Relative %: 77 %
Platelets: 147 10*3/uL — ABNORMAL LOW (ref 150–400)
RBC: 4.14 MIL/uL (ref 3.87–5.11)
RDW: 15.3 % (ref 11.5–15.5)
WBC: 5.6 10*3/uL (ref 4.0–10.5)
nRBC: 0 % (ref 0.0–0.2)

## 2021-01-18 LAB — COMPREHENSIVE METABOLIC PANEL
ALT: 19 U/L (ref 0–44)
AST: 30 U/L (ref 15–41)
Albumin: 2.9 g/dL — ABNORMAL LOW (ref 3.5–5.0)
Alkaline Phosphatase: 67 U/L (ref 38–126)
Anion gap: 8 (ref 5–15)
BUN: 14 mg/dL (ref 6–20)
CO2: 22 mmol/L (ref 22–32)
Calcium: 8.2 mg/dL — ABNORMAL LOW (ref 8.9–10.3)
Chloride: 106 mmol/L (ref 98–111)
Creatinine, Ser: 0.95 mg/dL (ref 0.44–1.00)
GFR, Estimated: >60 mL/min (ref >60)
Glucose, Bld: 130 mg/dL — ABNORMAL HIGH (ref 70–99)
Potassium: 4 mmol/L (ref 3.5–5.1)
Sodium: 136 mmol/L (ref 135–145)
Total Bilirubin: 0.9 mg/dL (ref 0.3–1.2)
Total Protein: 6.6 g/dL (ref 6.5–8.1)

## 2021-01-18 LAB — URINE CULTURE: Culture: NO GROWTH

## 2021-01-18 LAB — ECHOCARDIOGRAM COMPLETE
AR max vel: 2.12 cm2
AV Area VTI: 2.19 cm2
AV Area mean vel: 2.13 cm2
AV Mean grad: 6 mmHg
AV Peak grad: 9.6 mmHg
Ao pk vel: 1.55 m/s
Area-P 1/2: 4.46 cm2
Height: 72 in
MV VTI: 2.99 cm2
S' Lateral: 3.05 cm
Weight: 8848 oz

## 2021-01-18 LAB — HEMOGLOBIN A1C
Hgb A1c MFr Bld: 8.1 % — ABNORMAL HIGH (ref 4.8–5.6)
Mean Plasma Glucose: 186 mg/dL

## 2021-01-18 LAB — GLUCOSE, CAPILLARY
Glucose-Capillary: 125 mg/dL — ABNORMAL HIGH (ref 70–99)
Glucose-Capillary: 107 mg/dL — ABNORMAL HIGH (ref 70–99)
Glucose-Capillary: 99 mg/dL (ref 70–99)
Glucose-Capillary: 105 mg/dL — ABNORMAL HIGH (ref 70–99)

## 2021-01-18 LAB — MAGNESIUM: Magnesium: 2 mg/dL (ref 1.7–2.4)

## 2021-01-18 LAB — VANCOMYCIN, TROUGH: Vancomycin Tr: 23 ug/mL (ref 15–20)

## 2021-01-18 LAB — HIV ANTIBODY (ROUTINE TESTING W REFLEX): HIV Screen 4th Generation wRfx: NONREACTIVE

## 2021-01-18 MED ORDER — PROMETHAZINE HCL 25 MG/ML IJ SOLN
INTRAMUSCULAR | Status: AC
  Filled 2021-01-18: qty 1

## 2021-01-18 MED ORDER — VANCOMYCIN HCL 1250 MG/250ML IV SOLN
1250.0000 mg | Freq: Three times a day (TID) | INTRAVENOUS | Status: DC
  Administered 2021-01-19: 1250 mg via INTRAVENOUS
  Filled 2021-01-18: qty 250

## 2021-01-18 MED ORDER — ONDANSETRON 4 MG PO TBDP
8.0000 mg | ORAL_TABLET | Freq: Four times a day (QID) | ORAL | Status: DC | PRN
  Administered 2021-01-18 – 2021-01-20 (×3): 8 mg via ORAL
  Filled 2021-01-18 (×4): qty 2

## 2021-01-18 MED ORDER — SODIUM CHLORIDE 0.9 % IV SOLN
12.5000 mg | INTRAVENOUS | Status: DC | PRN
  Administered 2021-01-19: 12.5 mg via INTRAVENOUS
  Filled 2021-01-18: qty 0.5

## 2021-01-18 NOTE — Progress Notes (Signed)
PROGRESS NOTE   Brittany [name]  MKL:[id number] DOB: September 18, 1980 DOA: 01/16/2021 PCP: [name]   Chief Complaint  Patient presents with   Fever   Level of care: Stepdown  Brief Admission History:  40 y.o. female with medical history significant of anxiety, left carpal tunnel syndrome, chronic abdominal pain, gastroparesis, GERD, depression, type II DM, cholelithiasis, headaches, hypothyroidism, sleep apnea not on CPAP, lymphedema, history of recurrent cellulitis of the LLE who is coming to the emergency department with complaints of chills, fever over 101.4 F at home associated with edema, erythema, calor and TTP of left inner thigh area typical of her previous cellulitis episodes in the same area.  She also had generalized headache and 2 episodes of emesis on the way to the hospital.  She stated that she gets an episode almost yearly.  She denied rhinorrhea, sore throat, wheezing, cough, hemoptysis, dyspnea, chest pain, palpitations, diaphoresis, PND or orthopnea.  No diarrhea, occasional constipation, no melena or hematochezia.  Denied dysuria, frequency or hematuria.  No polyuria, polydipsia or blurred vision.   ED Course: Initial vital signs were temperature 100.3 F, pulse 128, respirations 24, BP 120/87 mmHg and O2 sat 100% on room air.  The patient received 2400 mL of LR bolus, ondansetron 4 mg IVP, hydromorphone 1 mg IVP, ceftriaxone and vancomycin.  I added another 2000 mL of LR bolus.   Lab work: Her urinalysis shows small hemoglobinuria and rare bacteria.  CBC showed a white count of 16.2, hemoglobin 14.8 g/dL and platelets 697.  She had a normal coagulation panel.  Her lactic acid initially was 3.0 and follow-up was 2.6 mmol/L.  CMP showed normal electrolytes.  Glucose 173 and creatinine 1.03 mg/dL.  Total protein was 8.6 g/dL.  BUN and the rest of the hepatic functions are within normal range.  Coronavirus and influenza PCR was negative.   Imaging: A 2 view chest  radiograph showed questionable mild airways thickening.  These could be seen with bronchitis or reactive airways disease.  Lungs were clear otherwise accounting for body habitus.  Please see images and full radiology report for further detail.  Assessment & Plan:   Principal Problem:   Sepsis due to cellulitis [facility]) Active Problems:   Hyponatremia   Sleep apnea   Sepsis (HCC)   GERD (gastroesophageal reflux disease)   Uncontrolled type 2 diabetes mellitus with hyperglycemia, with long-term current use of insulin (HCC)   Hypothyroid   Lymphedema   Class 3 obesity   Gastroparesis   Hypokalemia   Hypomagnesemia   Elevated troponin  Severe sepsis secondary to cellulitis of left leg - Pt admitted to stepdown ICU.  IV fluid resuscitation has been completed.  Continue IV fluids, lactic acid trending down.  Continuing IV antibiotics.  Supportive measures, cardiac monitoring.   Cellulitis of left lower leg - continue high dose ceftriaxone and vancomycin, follow blood cultures, continue supportive measures and local skin care.  Venous dopplers negative for DVT.   Hypothyroidism - reported by history and not on hormone supplement, TSH is within normal limits.   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   CBG (last 3)  Recent Labs    01/17/21 1701 01/17/21 2055 01/18/21 0736  GLUCAP 132* 86 125*   Chronic lower extremity lymphedema   Morbid obesity - ordered a bariatric bed for inpatient stay.    History  of gastroparesis - currently stable and asymptomatic.   OSA - Pt refuses to use CPAP.  We have provided nightly oxygen by [place] but she is not happy with using that either.  She desaturates into the 70s at night when sleeping.    SVT - Pt had an episode of recurrent SVT when bearing down to pass flatus, she was totally asymptomatic.  She was treated with beta blockers and is better now.  She is now on metoprolol. Will follow.  Dr. Robb Matar ordered a 2D Echo.    DVT prophylaxis: high dose enoxaparin  Code Status: full  Family Communication: son at bedside  Disposition: anticipating home  Status is: Inpatient  Remains inpatient appropriate because:IV treatments appropriate due to intensity of illness or inability to take PO and Inpatient level of care appropriate due to severity of illness  Dispo: The patient is from: Home              Anticipated d/c is to: Home              Patient currently is not medically stable to d/c.   Difficult to place patient No  Consultants:    Procedures:    Antimicrobials:  Ceftriaxone 7/10>> Vancomycin 7/10>>   Subjective: Pt reports that she did not feel when she was having the SVT episodes last night.    Objective: Vitals:   01/18/21 0507 01/18/21 0600 01/18/21 0737 01/18/21 0911  BP: 138/86 133/90  112/81  Pulse: 91 91 94 91  Resp: (!) 27 (!) 24 (!) 21   Temp:   98.8 F (37.1 C)   TempSrc:   Oral   SpO2: 98% 99% 97%   Weight:      Height:        Intake/Output Summary (Last 24 hours) at 01/18/2021 0955 Last data filed at 01/18/2021 0342 Gross per 24 hour  Intake 5293.04 ml  Output 1 ml  Net 5292.04 ml   Filed Weights   01/16/21 2222  Weight: (!) 250.8 kg    Examination:  General exam: m. obese female, awake, alert, oriented x 3.  Appears calm and comfortable  Respiratory system: Clear to auscultation. Respiratory effort normal. Cardiovascular system: normal S1 & S2 heard. No JVD, murmurs, rubs, gallops or clicks. No pedal edema. Gastrointestinal system: Abdomen is nondistended, soft and nontender. No organomegaly or masses felt. Normal bowel sounds heard. Central nervous system: Alert and oriented. No focal neurological deficits. Extremities: Symmetric 5 x 5 power. Skin: left lower leg with cracked open areas and surrounding erythema and warmth consistent  with proximally spreading cellulitis unchanged.  Psychiatry: Judgement and insight appear normal. Mood & affect appropriate.   Data Reviewed: I have personally reviewed following labs and imaging studies  CBC: Recent Labs  Lab 01/16/21 2311 01/17/21 0732 01/17/21 2107 01/18/21 0631  WBC 16.2* 22.7*  --  5.6  NEUTROABS 15.0*  --   --  4.3  HGB 14.8 12.3 11.8* 11.5*  HCT 47.3* 38.9 37.2 38.4  MCV 90.4 90.3  --  92.8  PLT 212 223  --  147*    Basic Metabolic Panel: Recent Labs  Lab 01/16/21 2311 01/17/21 0457 01/17/21 0732 01/17/21 2106 01/18/21 0631  NA 133* 135 133* 132* 136  K 4.1 4.2 4.1 3.4* 4.0  CL 100 104 102 104 106  CO2 GLUCOSE 173* 209* 211* 92 130*  BUN CREATININE 1.03* 1.06* 1.13* 1.06* 0.95  CALCIUM 8.9 8.2* 8.2* 8.0* 8.2*  MG  --   --   --  1.5*  --     GFR: Estimated Creatinine Clearance: 175.6 mL/min (by C-G formula based on SCr of 0.95 mg/dL).  Liver Function Tests: Recent Labs  Lab 01/16/21 2311 01/18/21 0631  AST 23 30  ALT 25 19  ALKPHOS 94 67  BILITOT 1.0 0.9  PROT 8.6* 6.6  ALBUMIN 3.9 2.9*    CBG: Recent Labs  Lab 01/17/21 0738 01/17/21 1232 01/17/21 1701 01/17/21 2055 01/18/21 0736  GLUCAP 196* 162* 132* 86 125*    Recent Results (from the past 240 hour(s))  Resp Panel by RT-PCR (Flu A&B, Covid) Nasopharyngeal Swab     Status: None   Collection Time: 01/16/21 11:46 PM   Specimen: Nasopharyngeal Swab; Nasopharyngeal(NP) swabs in vial transport medium  Result Value Ref Range Status   SARS Coronavirus 2 by RT PCR NEGATIVE NEGATIVE Final    Comment: (NOTE) SARS-CoV-2 target nucleic acids are NOT DETECTED.  The SARS-CoV-2 RNA is generally detectable in upper respiratory specimens during the acute phase of infection. The lowest concentration of SARS-CoV-2 viral copies this assay can detect is 138 copies/mL. A negative result does not preclude SARS-Cov-2 infection and should not be used as the  sole basis for treatment or other patient management decisions. A negative result may occur with  improper specimen collection/handling, submission of specimen other than nasopharyngeal swab, presence of viral mutation(s) within the areas targeted by this assay, and inadequate number of viral copies(<138 copies/mL). A negative result must be combined with clinical observations, patient history, and epidemiological information. The expected result is Negative.  Fact Sheet for Patients:  BloggerCourse.com  Fact Sheet for Healthcare Providers:  SeriousBroker.it  This test is no t yet approved or cleared by the [place] FDA and  has been authorized for detection and/or diagnosis of SARS-CoV-2 by FDA under an Emergency Use Authorization (EUA). This EUA will remain  in effect (meaning this test can be used) for the duration of the COVID-19 declaration under Section 564(b)(1) of the Act, 21 U.S.C.section 360bbb-3(b)(1), unless the authorization is terminated  or revoked sooner.       Influenza A by PCR NEGATIVE NEGATIVE Final   Influenza B by PCR NEGATIVE NEGATIVE Final    Comment: (NOTE) The Xpert Xpress SARS-CoV-2/FLU/RSV plus assay is intended as an aid in the diagnosis of influenza from Nasopharyngeal swab specimens and should not be used as a sole basis for treatment. Nasal washings and aspirates are unacceptable for Xpert Xpress SARS-CoV-2/FLU/RSV testing.  Fact Sheet for Patients: BloggerCourse.com  Fact Sheet for Healthcare Providers: SeriousBroker.it  This test is not yet approved or cleared by the [place] FDA and has been authorized for detection and/or diagnosis of SARS-CoV-2 by FDA under an Emergency Use Authorization (EUA). This EUA will remain in effect (meaning this test can be used) for the duration of the COVID-19 declaration under Section 564(b)(1) of the  Act, 21 U.S.C. section 360bbb-3(b)(1), unless the authorization is terminated or revoked.  Performed at [facility], [address], [place], [place] [address]   Blood Culture (routine x 2)     Status: None (Preliminary result)   Collection Time: 01/16/21 11:46 PM   Specimen: BLOOD RIGHT ARM  Result Value Ref Range Status   Specimen Description BLOOD RIGHT ARM  Final   Special Requests   Final    Blood Culture results may not be optimal due to an excessive volume of blood  received in culture bottles BOTTLES DRAWN AEROBIC AND ANAEROBIC   Culture   Final    NO GROWTH < 24 HOURS Performed at [facility], [address] [place], [place] [address]    Report Status PENDING  Incomplete  Blood Culture (routine x 2)     Status: None (Preliminary result)   Collection Time: 01/16/21 11:46 PM   Specimen: BLOOD RIGHT FOREARM  Result Value Ref Range Status   Specimen Description BLOOD RIGHT FOREARM  Final   Special Requests   Final    Blood Culture results may not be optimal due to an excessive volume of blood received in culture bottles BOTTLES DRAWN AEROBIC ONLY   Culture   Final    NO GROWTH < 24 HOURS Performed at [facility], [address] [place], [place] [address]    Report Status PENDING  Incomplete  MRSA Next Gen by PCR, Nasal     Status: None   Collection Time: 01/17/21  4:54 AM   Specimen: Nasal Mucosa; Nasal Swab  Result Value Ref Range Status   MRSA by PCR Next Gen NOT DETECTED NOT DETECTED Final    Comment: (NOTE) The GeneXpert MRSA Assay (FDA approved for NASAL specimens only), is one component of a comprehensive MRSA colonization surveillance program. It is not intended to diagnose MRSA infection nor to guide or monitor treatment for MRSA infections. Test performance is not FDA approved in patients less than 42 years old. Performed at [facility], [address] [place], [place] [address]      Radiology Studies: DG Chest 2 View  Result Date: 01/16/2021 CLINICAL  DATA:  Chills and fever EXAM: CHEST - 2 VIEW COMPARISON:  Radiograph 08/14/2019 FINDINGS: Some increased attenuation towards the lung bases is largely attributable to patient body habitus. Question some mild airways thickening. No consolidation, features of edema, pneumothorax, or effusion. Pulmonary vascularity is normally distributed. The cardiomediastinal contours are unremarkable. No acute osseous or soft tissue abnormality. IMPRESSION: Questionable mild airways thickening. Could be seen with bronchitis or reactive airways disease. Lungs are otherwise clear accounting for body habitus. Electronically Signed   By: Kreg ShropshirePrice  DeHay M.D.   On: 01/16/2021 23:20   US Venous Img Lower Bilateral (DVT)  Result Date: 01/17/2021 CLINICAL DATA:  Bilateral lower extremity cellulitis. Morbidly obese. EXAM: BILATERAL LOWER EXTREMITY VENOUS DOPPLER ULTRASOUND TECHNIQUE: Gray-scale sonography with compression, as well as color and duplex ultrasound, were performed to evaluate the deep venous system(s) from the level of the common femoral vein through the popliteal and proximal calf veins. COMPARISON:  None. FINDINGS: VENOUS Normal compressibility of the common femoral, superficial femoral, and popliteal veins, as well as the visualized calf veins. Visualized portions of profunda femoral vein and great saphenous vein unremarkable. No filling defects to suggest DVT on grayscale or color Doppler imaging. Doppler waveforms show normal direction of venous flow, normal respiratory plasticity and response to augmentation. Limited views of the contralateral common femoral vein are unremarkable. OTHER None. Limitations: Study is significantly limited due to body habitus and patient's inability to lie down due to shortness of breath. RIGHT calf veins not able to be seen. IMPRESSION: No obvious evidence of DVT within either lower extremity, but with significant study limitations detailed above. Electronically Signed   By: Bary RichardStan  Maynard  M.D.   On: 01/17/2021 10:17    Scheduled Meds:  Chlorhexidine Gluconate Cloth  6 each Topical Daily   enoxaparin (LOVENOX) injection  120 mg Subcutaneous Q24H   insulin aspart  0-20 Units  Subcutaneous TID WC   insulin aspart  0-5 Units Subcutaneous QHS   insulin aspart  10 Units Subcutaneous TID WC   insulin glargine  50 Units Subcutaneous Daily   metoprolol succinate  50 mg Oral Daily   saccharomyces boulardii  250 mg Oral BID   Continuous Infusions:  cefTRIAXone (ROCEPHIN)  IV Stopped (01/18/21 0109)   lactated ringers 150 mL/hr at 01/18/21 0342   ondansetron (ZOFRAN) IV Stopped (01/18/21 0016)   vancomycin 1,500 mg (01/18/21 0918)    LOS: 1 day   Time spent: 35 mins   Roxanne Orner Laural Benes, MD How to contact the [facility] Attending or Consulting provider 7A - 7P or covering provider during after hours 7P -7A, for this patient?  Check the care team in [facility] and look for a) attending/consulting [facility] provider listed and b) the [facility] team listed Log into [email or web address] and use [place]'s universal password to access. If you do not have the password, please contact the hospital operator. Locate the [facility] provider you are looking for under [facility] and page to a number that you can be directly reached. If you still have difficulty reaching the provider, please page the [facility] (Director on Call) for the Hospitalists listed on amion for assistance.  01/18/2021, 9:55 AM

## 2021-01-18 NOTE — Progress Notes (Signed)
Patient's oxygen saturation dropped into the 70's. RN to room, patient sleeping. Woke patient and asked about any history of sleep apnea. Pt stated "they told me I have that but I can't wear that mask because it freaks me out". Pt willing to wear nasal cannula while sleeping. Placed on 3 liters, o2 saturation 98%. Will continue to monitor.

## 2021-01-18 NOTE — Progress Notes (Addendum)
Pharmacy Antibiotic Note  Brittany [name] is a 40 y.o. female admitted on 01/16/2021 with cellulitis with high risk of MRSA.  Pharmacy has been consulted for vancomycin dosing.  Plan: VT 23 Decrease vancomycin to 1500 mg IV every 12 hours. Monitor labs, c/s, and vanco level as indicated.  Height: 6' (182.9 cm) Weight: (!) 250.8 kg (553 lb) IBW/kg (Calculated) : 73.1  Temp (24hrs), Avg:98.2 F (36.8 C), Min:97.7 F (36.5 C), Max:98.8 F (37.1 C)  Recent Labs  Lab 01/16/21 2311 01/16/21 2346 01/17/21 0457 01/17/21 0732 01/17/21 1107 01/17/21 2106 01/18/21 0631 01/18/21 1544  WBC 16.2*  --   --  22.7*  --   --  5.6  --   CREATININE 1.03*  --  1.06* 1.13*  --  1.06* 0.95  --   LATICACIDVEN 3.0* 2.6*  --  2.2* 2.1*  --   --   --   VANCOTROUGH  --   --   --   --   --   --   --  23*     Estimated Creatinine Clearance: 175.6 mL/min (by C-G formula based on SCr of 0.95 mg/dL).    Allergies  Allergen Reactions   Paxil [Paroxetine Hcl]     Suicidal thoughts   Morphine And Related Other (See Comments)    "it just freaks me out"   Latex Itching, Swelling and Rash   Sulfa Antibiotics Nausea And Vomiting and Rash   CTX 7/10 >> Vanco 7/10 >>  7/9 Bcx: ngtd 7/10  Ucx: ng MRSA PCR: negative  Thank you for allowing pharmacy to be a part of this patient's care.  Judeth Cornfield, PharmD Clinical Pharmacist 01/18/2021 8:39 PM

## 2021-01-18 NOTE — Progress Notes (Signed)
  Echocardiogram 2D Echocardiogram has been performed.  Carolyne Fiscal 01/18/2021, 10:43 AM

## 2021-01-19 LAB — CBC WITH DIFFERENTIAL/PLATELET
Abs Immature Granulocytes: 0.02 10*3/uL (ref 0.00–0.07)
Basophils Absolute: 0 10*3/uL (ref 0.0–0.1)
Basophils Relative: 1 %
Eosinophils Absolute: 0.1 10*3/uL (ref 0.0–0.5)
Eosinophils Relative: 3 %
HCT: 36.6 % (ref 36.0–46.0)
Hemoglobin: 11.1 g/dL — ABNORMAL LOW (ref 12.0–15.0)
Immature Granulocytes: 1 %
Lymphocytes Relative: 28 %
Lymphs Abs: 1.2 10*3/uL (ref 0.7–4.0)
MCH: 28.2 pg (ref 26.0–34.0)
MCHC: 30.3 g/dL (ref 30.0–36.0)
MCV: 93.1 fL (ref 80.0–100.0)
Monocytes Absolute: 0.3 10*3/uL (ref 0.1–1.0)
Monocytes Relative: 7 %
Neutro Abs: 2.6 10*3/uL (ref 1.7–7.7)
Neutrophils Relative %: 60 %
Platelets: 159 10*3/uL (ref 150–400)
RBC: 3.93 MIL/uL (ref 3.87–5.11)
RDW: 15.1 % (ref 11.5–15.5)
WBC: 4.2 10*3/uL (ref 4.0–10.5)
nRBC: 0 % (ref 0.0–0.2)

## 2021-01-19 LAB — COMPREHENSIVE METABOLIC PANEL
ALT: 16 U/L (ref 0–44)
AST: 24 U/L (ref 15–41)
Albumin: 2.8 g/dL — ABNORMAL LOW (ref 3.5–5.0)
Alkaline Phosphatase: 67 U/L (ref 38–126)
Anion gap: 7 (ref 5–15)
BUN: 11 mg/dL (ref 6–20)
CO2: 24 mmol/L (ref 22–32)
Calcium: 8.3 mg/dL — ABNORMAL LOW (ref 8.9–10.3)
Chloride: 105 mmol/L (ref 98–111)
Creatinine, Ser: 0.86 mg/dL (ref 0.44–1.00)
GFR, Estimated: >60 mL/min (ref >60)
Glucose, Bld: 109 mg/dL — ABNORMAL HIGH (ref 70–99)
Potassium: 4 mmol/L (ref 3.5–5.1)
Sodium: 136 mmol/L (ref 135–145)
Total Bilirubin: 0.6 mg/dL (ref 0.3–1.2)
Total Protein: 6.7 g/dL (ref 6.5–8.1)

## 2021-01-19 LAB — GLUCOSE, CAPILLARY
Glucose-Capillary: 106 mg/dL — ABNORMAL HIGH (ref 70–99)
Glucose-Capillary: 118 mg/dL — ABNORMAL HIGH (ref 70–99)
Glucose-Capillary: 109 mg/dL — ABNORMAL HIGH (ref 70–99)
Glucose-Capillary: 91 mg/dL (ref 70–99)

## 2021-01-19 LAB — T3: T3, Total: 103 ng/dL (ref 71–180)

## 2021-01-19 LAB — MAGNESIUM: Magnesium: 2.1 mg/dL (ref 1.7–2.4)

## 2021-01-19 MED ORDER — VANCOMYCIN HCL 1500 MG/300ML IV SOLN
1500.0000 mg | Freq: Two times a day (BID) | INTRAVENOUS | Status: DC
  Administered 2021-01-19 – 2021-01-20 (×2): 1500 mg via INTRAVENOUS
  Filled 2021-01-19 (×2): qty 300

## 2021-01-19 MED ORDER — ONDANSETRON HCL 4 MG/2ML IJ SOLN
4.0000 mg | Freq: Four times a day (QID) | INTRAMUSCULAR | Status: DC | PRN
  Administered 2021-01-19: 4 mg via INTRAVENOUS
  Filled 2021-01-19: qty 2

## 2021-01-19 NOTE — Progress Notes (Signed)
PROGRESS NOTE   Brittany [name]  XYI:[id number] DOB: Jun 09, 1981 DOA: 01/16/2021 PCP: [name]   Chief Complaint  Patient presents with   Fever   Level of care: Med-Surg  Brief Admission History:  40 y.o. female with medical history significant of anxiety, left carpal tunnel syndrome, chronic abdominal pain, gastroparesis, GERD, depression, type II DM, cholelithiasis, headaches, hypothyroidism, sleep apnea not on CPAP, lymphedema, history of recurrent cellulitis of the LLE who is coming to the emergency department with complaints of chills, fever over 101.4 F at home associated with edema, erythema, calor and TTP of left inner thigh area typical of her previous cellulitis episodes in the same area.  She also had generalized headache and 2 episodes of emesis on the way to the hospital.  She stated that she gets an episode almost yearly.  She denied rhinorrhea, sore throat, wheezing, cough, hemoptysis, dyspnea, chest pain, palpitations, diaphoresis, PND or orthopnea.  No diarrhea, occasional constipation, no melena or hematochezia.  Denied dysuria, frequency or hematuria.  No polyuria, polydipsia or blurred vision.   ED Course: Initial vital signs were temperature 100.3 F, pulse 128, respirations 24, BP 120/87 mmHg and O2 sat 100% on room air.  The patient received 2400 mL of LR bolus, ondansetron 4 mg IVP, hydromorphone 1 mg IVP, ceftriaxone and vancomycin.  I added another 2000 mL of LR bolus.   Lab work: Her urinalysis shows small hemoglobinuria and rare bacteria.  CBC showed a white count of 16.2, hemoglobin 14.8 g/dL and platelets 270.  She had a normal coagulation panel.  Her lactic acid initially was 3.0 and follow-up was 2.6 mmol/L.  CMP showed normal electrolytes.  Glucose 173 and creatinine 1.03 mg/dL.  Total protein was 8.6 g/dL.  BUN and the rest of the hepatic functions are within normal range.  Coronavirus and influenza PCR was negative.   Imaging: A 2 view chest  radiograph showed questionable mild airways thickening.  These could be seen with bronchitis or reactive airways disease.  Lungs were clear otherwise accounting for body habitus.  Please see images and full radiology report for further detail.  Assessment & Plan:   Principal Problem:   Sepsis due to cellulitis [facility]) Active Problems:   Hyponatremia   Sleep apnea   Sepsis (HCC)   GERD (gastroesophageal reflux disease)   Uncontrolled type 2 diabetes mellitus with hyperglycemia, with long-term current use of insulin (HCC)   Hypothyroid   Lymphedema   Class 3 obesity   Gastroparesis   Hypokalemia   Hypomagnesemia   Elevated troponin  Severe sepsis secondary to cellulitis of left leg - Pt admitted to stepdown ICU.  IV fluid resuscitation has been completed.  Continue IV fluids, lactic acid trending down.  Continuing IV antibiotics.  Supportive measures, cardiac monitoring.   Cellulitis of left lower leg - Improving with treatments, continue high dose ceftriaxone and vancomycin, follow blood cultures, continue supportive measures and local skin care.  Venous dopplers negative for DVT.   Hypothyroidism - reported by history and not on hormone supplement, TSH is within normal limits.   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   CBG (last 3)  Recent Labs    01/19/21 0712 01/19/21 1117 01/19/21 1622  GLUCAP 106* 118* 109*   Chronic lower extremity lymphedema   Morbid obesity - ordered a bariatric bed for inpatient stay.  History of gastroparesis - currently stable and asymptomatic.   OSA - Pt refuses to use CPAP.  We have provided nightly oxygen by [place] but she is not happy with using that either.  She desaturates into the 70s at night when sleeping.    SVT - Pt had an episode of recurrent SVT when bearing down to pass flatus,  she was totally asymptomatic. She was treated with beta blockers and is better now.  She is now on metoprolol. Will follow.  Dr. Robb Matar ordered a 2D Echo.  Left ventricular ejection fraction, by estimation, is 60 to 65%. The left ventricle has normal function. The left ventricle has no regional wall motion abnormalities. There is mild left ventricular hypertrophy. Left ventricular diastolic parameters were normal.   DVT prophylaxis: high dose enoxaparin  Code Status: full  Family Communication: son at bedside  Disposition: anticipating home  Status is: Inpatient  Remains inpatient appropriate because:IV treatments appropriate due to intensity of illness or inability to take PO and Inpatient level of care appropriate due to severity of illness  Dispo: The patient is from: Home              Anticipated d/c is to: Home              Patient currently is not medically stable to d/c.   Difficult to place patient No  Consultants:    Procedures:    Antimicrobials:  Ceftriaxone 7/10>> Vancomycin 7/10>>   Subjective: Pt reports that she can notice improvement in pain and heat and redness of left leg.     Objective: Vitals:   01/19/21 1300 01/19/21 1400 01/19/21 1500 01/19/21 1623  BP: (!) 117/53 121/65 130/71   Pulse: 81 88 81 81  Resp:      Temp:    97.9 F (36.6 C)  TempSrc:    Oral  SpO2: 100%  98% 98%  Weight:      Height:        Intake/Output Summary (Last 24 hours) at 01/19/2021 1720 Last data filed at 01/19/2021 1346 Gross per 24 hour  Intake 2391.98 ml  Output --  Net 2391.98 ml   Filed Weights   01/16/21 2222  Weight: (!) 250.8 kg    Examination:  General exam: m. obese female, awake, alert, oriented x 3.  Appears calm and comfortable  Respiratory system: Clear to auscultation. Respiratory effort normal. Cardiovascular system: normal S1 & S2 heard. No JVD, murmurs, rubs, gallops or clicks. No pedal edema. Gastrointestinal system: Abdomen is nondistended, soft  and nontender. No organomegaly or masses felt. Normal bowel sounds heard. Central nervous system: Alert and oriented. No focal neurological deficits. Extremities: Symmetric 5 x 5 power. Skin: marked improvement in erythema and no proximal spreading of infection involving left lower leg with cracked open areas and surrounding erythema and warmth consistent with proximally spreading cellulitis unchanged.  Psychiatry: Judgement and insight appear normal. Mood & affect appropriate.   Data Reviewed: I have personally reviewed following labs and imaging studies  CBC: Recent Labs  Lab 01/16/21 2311 01/17/21 0732 01/17/21 2107 01/18/21 0631 01/19/21 0450  WBC 16.2* 22.7*  --  5.6 4.2  NEUTROABS 15.0*  --   --  4.3 2.6  HGB 14.8 12.3 11.8* 11.5* 11.1*  HCT 47.3* 38.9 37.2 38.4 36.6  MCV 90.4 90.3  --  92.8 93.1  PLT 212 223  --  147* 159    Basic Metabolic Panel: Recent Labs  Lab 01/17/21 0457 01/17/21 0732 01/17/21  2106 01/18/21 0631 01/18/21 1049 01/19/21 0450  NA 135 133* 132* 136  --  136  K 4.2 4.1 3.4* 4.0  --  4.0  CL 104 102 104 106  --  105  CO2 23 23 22 22   --  24  GLUCOSE 209* 211* 92 130*  --  109*  BUN 15 17 19 14   --  11  CREATININE 1.06* 1.13* 1.06* 0.95  --  0.86  CALCIUM 8.2* 8.2* 8.0* 8.2*  --  8.3*  MG  --   --  1.5*  --  2.0 2.1    GFR: Estimated Creatinine Clearance: 194 mL/min (by C-G formula based on SCr of 0.86 mg/dL).  Liver Function Tests: Recent Labs  Lab 01/16/21 2311 01/18/21 0631 01/19/21 0450  AST 23 30 24   ALT 25 19 16   ALKPHOS 94 67 67  BILITOT 1.0 0.9 0.6  PROT 8.6* 6.6 6.7  ALBUMIN 3.9 2.9* 2.8*    CBG: Recent Labs  Lab 01/18/21 1620 01/18/21 2119 01/19/21 0712 01/19/21 1117 01/19/21 1622  GLUCAP 99 105* 106* 118* 109*    Recent Results (from the past 240 hour(s))  Resp Panel by RT-PCR (Flu A&B, Covid) Nasopharyngeal Swab     Status: None   Collection Time: 01/16/21 11:46 PM   Specimen: Nasopharyngeal Swab;  Nasopharyngeal(NP) swabs in vial transport medium  Result Value Ref Range Status   SARS Coronavirus 2 by RT PCR NEGATIVE NEGATIVE Final    Comment: (NOTE) SARS-CoV-2 target nucleic acids are NOT DETECTED.  The SARS-CoV-2 RNA is generally detectable in upper respiratory specimens during the acute phase of infection. The lowest concentration of SARS-CoV-2 viral copies this assay can detect is 138 copies/mL. A negative result does not preclude SARS-Cov-2 infection and should not be used as the sole basis for treatment or other patient management decisions. A negative result may occur with  improper specimen collection/handling, submission of specimen other than nasopharyngeal swab, presence of viral mutation(s) within the areas targeted by this assay, and inadequate number of viral copies(<138 copies/mL). A negative result must be combined with clinical observations, patient history, and epidemiological information. The expected result is Negative.  Fact Sheet for Patients:  BloggerCourse.comhttps://www.fda.gov/media/152166/download  Fact Sheet for Healthcare Providers:  SeriousBroker.ithttps://www.fda.gov/media/152162/download  This test is no t yet approved or cleared by the [place] FDA and  has been authorized for detection and/or diagnosis of SARS-CoV-2 by FDA under an Emergency Use Authorization (EUA). This EUA will remain  in effect (meaning this test can be used) for the duration of the COVID-19 declaration under Section 564(b)(1) of the Act, 21 U.S.C.section 360bbb-3(b)(1), unless the authorization is terminated  or revoked sooner.       Influenza A by PCR NEGATIVE NEGATIVE Final   Influenza B by PCR NEGATIVE NEGATIVE Final    Comment: (NOTE) The Xpert Xpress SARS-CoV-2/FLU/RSV plus assay is intended as an aid in the diagnosis of influenza from Nasopharyngeal swab specimens and should not be used as a sole basis for treatment. Nasal washings and aspirates are unacceptable for Xpert Xpress  SARS-CoV-2/FLU/RSV testing.  Fact Sheet for Patients: BloggerCourse.comhttps://www.fda.gov/media/152166/download  Fact Sheet for Healthcare Providers: SeriousBroker.ithttps://www.fda.gov/media/152162/download  This test is not yet approved or cleared by the [place] FDA and has been authorized for detection and/or diagnosis of SARS-CoV-2 by FDA under an Emergency Use Authorization (EUA). This EUA will remain in effect (meaning this test can be used) for the duration of the COVID-19 declaration under Section 564(b)(1) of the Act, 21 U.S.C.  section 360bbb-3(b)(1), unless the authorization is terminated or revoked.  Performed at [facility], [address] [place], [place] [address]   Blood Culture (routine x 2)     Status: None (Preliminary result)   Collection Time: 01/16/21 11:46 PM   Specimen: BLOOD RIGHT ARM  Result Value Ref Range Status   Specimen Description BLOOD RIGHT ARM  Final   Special Requests   Final    Blood Culture results may not be optimal due to an excessive volume of blood received in culture bottles BOTTLES DRAWN AEROBIC AND ANAEROBIC   Culture   Final    NO GROWTH 2 DAYS Performed at [facility], [address] [place], [place] [address]    Report Status PENDING  Incomplete  Blood Culture (routine x 2)     Status: None (Preliminary result)   Collection Time: 01/16/21 11:46 PM   Specimen: BLOOD RIGHT FOREARM  Result Value Ref Range Status   Specimen Description BLOOD RIGHT FOREARM  Final   Special Requests   Final    Blood Culture results may not be optimal due to an excessive volume of blood received in culture bottles BOTTLES DRAWN AEROBIC ONLY   Culture   Final    NO GROWTH 2 DAYS Performed at [facility], [address], [place], [place] [address]    Report Status PENDING  Incomplete  Urine culture     Status: None   Collection Time: 01/17/21 12:43 AM   Specimen: In/Out Cath Urine  Result Value Ref Range Status   Specimen Description   Final    IN/OUT CATH  URINE Performed at [facility], [address], [place], [place] [address]    Special Requests   Final    NONE Performed at [facility], [address], [place], [place] [address]    Culture   Final    NO GROWTH Performed at [facility], [address], [place], [place] [address]    Report Status 01/18/2021 FINAL  Final  MRSA Next Gen by PCR, Nasal     Status: None   Collection Time: 01/17/21  4:54 AM   Specimen: Nasal Mucosa; Nasal Swab  Result Value Ref Range Status   MRSA by PCR Next Gen NOT DETECTED NOT DETECTED Final    Comment: (NOTE) The GeneXpert MRSA Assay (FDA approved for NASAL specimens only), is one component of a comprehensive MRSA colonization surveillance program. It is not intended to diagnose MRSA infection nor to guide or monitor treatment for MRSA infections. Test performance is not FDA approved in patients less than 5 years old. Performed at [facility], [address] [place], [place] [address]      Radiology Studies: ECHOCARDIOGRAM COMPLETE  Result Date: 01/18/2021    ECHOCARDIOGRAM REPORT   Patient Name:   HEDDY VIDANA Date of Exam: 01/18/2021 Medical Rec #:  [id number]        Height:       72.0 in Accession #:    [id number]       Weight:       553.0 lb Date of Birth:  [birth year]        BSA:          3.283 m Patient Age:    40 years         BP:           133/90 mmHg Patient Gender: F  HR:           94 bpm. [facility] Procedure: 2D Echo, Cardiac Doppler and Color Doppler Indications:    SVT  History:        Patient has no prior history of Echocardiogram examinations.                 Risk Factors:Diabetes.  Sonographer:    Mikki Harbor Referring Phys: [id number] DAVID MANUEL ORTIZ IMPRESSIONS  1. Left ventricular ejection fraction, by estimation, is 60 to 65%. The left ventricle has normal function. The left ventricle has no regional wall motion abnormalities. There is mild left ventricular hypertrophy. Left ventricular  diastolic parameters were normal.  2. Right ventricular systolic function is normal. The right ventricular size is normal. Tricuspid regurgitation signal is inadequate for assessing PA pressure.  3. The mitral valve is normal in structure. No evidence of mitral valve regurgitation. No evidence of mitral stenosis.  4. The aortic valve has an indeterminant number of cusps. Aortic valve regurgitation is not visualized. No aortic stenosis is present. FINDINGS  Left Ventricle: Left ventricular ejection fraction, by estimation, is 60 to 65%. The left ventricle has normal function. The left ventricle has no regional wall motion abnormalities. The left ventricular internal cavity size was normal in size. There is  mild left ventricular hypertrophy. Left ventricular diastolic parameters were normal. Right Ventricle: The right ventricular size is normal. No increase in right ventricular wall thickness. Right ventricular systolic function is normal. Tricuspid regurgitation signal is inadequate for assessing PA pressure. Left Atrium: Left atrial size was normal in size. Right Atrium: Right atrial size was normal in size. Pericardium: There is no evidence of pericardial effusion. Mitral Valve: The mitral valve is normal in structure. No evidence of mitral valve regurgitation. No evidence of mitral valve stenosis. MV peak gradient, 6.0 mmHg. The mean mitral valve gradient is 3.0 mmHg. Tricuspid Valve: The tricuspid valve is normal in structure. Tricuspid valve regurgitation is not demonstrated. No evidence of tricuspid stenosis. Aortic Valve: The aortic valve has an indeterminant number of cusps. Aortic valve regurgitation is not visualized. No aortic stenosis is present. Aortic valve mean gradient measures 6.0 mmHg. Aortic valve peak gradient measures 9.6 mmHg. Aortic valve area, by VTI measures 2.19 cm. Pulmonic Valve: The pulmonic valve was not well visualized. Pulmonic valve regurgitation is not visualized. No evidence of  pulmonic stenosis. Aorta: The aortic root is normal in size and structure. IAS/Shunts: The interatrial septum was not well visualized.  LEFT VENTRICLE PLAX 2D LVIDd:         5.46 cm  Diastology LVIDs:         3.05 cm  LV e' medial:    15.20 cm/s LV PW:         1.24 cm  LV E/e' medial:  6.3 LV IVS:        1.20 cm  LV e' lateral:   14.90 cm/s LVOT diam:     1.90 cm  LV E/e' lateral: 6.4 LV SV:         79 LV SV Index:   24 LVOT Area:     2.84 cm  RIGHT VENTRICLE RV Basal diam:  2.70 cm RV Mid diam:    2.61 cm RV S prime:     14.80 cm/s TAPSE (M-mode): 2.8 cm LEFT ATRIUM             Index       RIGHT ATRIUM  Index LA diam:        4.40 cm 1.34 cm/m  RA Area:     12.80 cm LA Vol (A2C):   56.2 ml 17.12 ml/m RA Volume:   29.40 ml  8.96 ml/m LA Vol (A4C):   54.0 ml 16.45 ml/m LA Biplane Vol: 54.5 ml 16.60 ml/m  AORTIC VALVE AV Area (Vmax):    2.12 cm AV Area (Vmean):   2.13 cm AV Area (VTI):     2.19 cm AV Vmax:           155.00 cm/s AV Vmean:          114.000 cm/s AV VTI:            0.362 m AV Peak Grad:      9.6 mmHg AV Mean Grad:      6.0 mmHg LVOT Vmax:         116.00 cm/s LVOT Vmean:        85.500 cm/s LVOT VTI:          0.280 m LVOT/AV VTI ratio: 0.77  AORTA Ao Root diam: 3.00 cm MITRAL VALVE MV Area (PHT): 4.46 cm    SHUNTS MV Area VTI:   2.99 cm    Systemic VTI:  0.28 m MV Peak grad:  6.0 mmHg    Systemic Diam: 1.90 cm MV Mean grad:  3.0 mmHg MV Vmax:       1.22 m/s MV Vmean:      78.2 cm/s MV Decel Time: 170 msec MV E velocity: 95.80 cm/s MV A velocity: 80.55 cm/s MV E/A ratio:  1.19 Dina Rich MD Electronically signed by Dina Rich MD Signature Date/Time: 01/18/2021/11:02:16 AM    Final     Scheduled Meds:  Chlorhexidine Gluconate Cloth  6 each Topical Daily   enoxaparin (LOVENOX) injection  120 mg Subcutaneous Q24H   insulin aspart  0-20 Units Subcutaneous TID WC   insulin aspart  0-5 Units Subcutaneous QHS   insulin aspart  10 Units Subcutaneous TID WC   insulin glargine  50  Units Subcutaneous Daily   metoprolol succinate  50 mg Oral Daily   saccharomyces boulardii  250 mg Oral BID   Continuous Infusions:  cefTRIAXone (ROCEPHIN)  IV 2 g (01/18/21 2321)   lactated ringers 10 mL/hr at 01/19/21 1007   vancomycin      LOS: 2 days   Time spent: 35 mins   Kanen Mottola Laural Benes, MD How to contact the [facility] Attending or Consulting provider 7A - 7P or covering provider during after hours 7P -7A, for this patient?  Check the care team in [facility] and look for a) attending/consulting [facility] provider listed and b) the [facility] team listed Log into [email or web address] and use [facility]'s universal password to access. If you do not have the password, please contact the hospital operator. Locate the [facility] provider you are looking for under [facility] and page to a number that you can be directly reached. If you still have difficulty reaching the provider, please page the [facility] (Director on Call) for the Hospitalists listed on amion for assistance.  01/19/2021, 5:20 PM

## 2021-01-20 LAB — COMPREHENSIVE METABOLIC PANEL
ALT: 16 U/L (ref 0–44)
AST: 22 U/L (ref 15–41)
Albumin: 2.8 g/dL — ABNORMAL LOW (ref 3.5–5.0)
Alkaline Phosphatase: 72 U/L (ref 38–126)
Anion gap: 8 (ref 5–15)
BUN: 9 mg/dL (ref 6–20)
CO2: 22 mmol/L (ref 22–32)
Calcium: 8.5 mg/dL — ABNORMAL LOW (ref 8.9–10.3)
Chloride: 107 mmol/L (ref 98–111)
Creatinine, Ser: 0.79 mg/dL (ref 0.44–1.00)
GFR, Estimated: >60 mL/min (ref >60)
Glucose, Bld: 97 mg/dL (ref 70–99)
Potassium: 4 mmol/L (ref 3.5–5.1)
Sodium: 137 mmol/L (ref 135–145)
Total Bilirubin: 0.7 mg/dL (ref 0.3–1.2)
Total Protein: 6.8 g/dL (ref 6.5–8.1)

## 2021-01-20 LAB — CBC WITH DIFFERENTIAL/PLATELET
Abs Immature Granulocytes: 0.03 10*3/uL (ref 0.00–0.07)
Basophils Absolute: 0 10*3/uL (ref 0.0–0.1)
Basophils Relative: 0 %
Eosinophils Absolute: 0.1 10*3/uL (ref 0.0–0.5)
Eosinophils Relative: 2 %
HCT: 36.7 % (ref 36.0–46.0)
Hemoglobin: 11.4 g/dL — ABNORMAL LOW (ref 12.0–15.0)
Immature Granulocytes: 1 %
Lymphocytes Relative: 30 %
Lymphs Abs: 1.5 10*3/uL (ref 0.7–4.0)
MCH: 28.5 pg (ref 26.0–34.0)
MCHC: 31.1 g/dL (ref 30.0–36.0)
MCV: 91.8 fL (ref 80.0–100.0)
Monocytes Absolute: 0.3 10*3/uL (ref 0.1–1.0)
Monocytes Relative: 6 %
Neutro Abs: 2.9 10*3/uL (ref 1.7–7.7)
Neutrophils Relative %: 61 %
Platelets: 167 10*3/uL (ref 150–400)
RBC: 4 MIL/uL (ref 3.87–5.11)
RDW: 15 % (ref 11.5–15.5)
WBC: 4.8 10*3/uL (ref 4.0–10.5)
nRBC: 0 % (ref 0.0–0.2)

## 2021-01-20 LAB — GLUCOSE, CAPILLARY
Glucose-Capillary: 103 mg/dL — ABNORMAL HIGH (ref 70–99)
Glucose-Capillary: 120 mg/dL — ABNORMAL HIGH (ref 70–99)

## 2021-01-20 MED ORDER — METOPROLOL SUCCINATE ER 50 MG PO TB24: 50.0000 mg | ORAL_TABLET | Freq: Every day | ORAL | 3 refills | Status: DC

## 2021-01-20 MED ORDER — CEPHALEXIN 500 MG PO CAPS: 500.0000 mg | ORAL_CAPSULE | Freq: Four times a day (QID) | ORAL | 0 refills | Status: AC

## 2021-01-20 NOTE — Plan of Care (Signed)
  Problem: Education: Goal: Knowledge of General Education information will improve Description: Including pain rating scale, medication(s)/side effects and non-pharmacologic comfort measures 01/20/2021 1143 by Genevie Ann, RN Outcome: Adequate for Discharge 01/20/2021 1143 by Genevie Ann, RN Outcome: Adequate for Discharge   Problem: Health Behavior/Discharge Planning: Goal: Ability to manage health-related needs will improve 01/20/2021 1143 by Genevie Ann, RN Outcome: Adequate for Discharge 01/20/2021 1143 by Genevie Ann, RN Outcome: Adequate for Discharge   Problem: Clinical Measurements: Goal: Ability to maintain clinical measurements within normal limits will improve 01/20/2021 1143 by Genevie Ann, RN Outcome: Adequate for Discharge 01/20/2021 1143 by Genevie Ann, RN Outcome: Adequate for Discharge Goal: Will remain free from infection 01/20/2021 1143 by Genevie Ann, RN Outcome: Adequate for Discharge 01/20/2021 1143 by Genevie Ann, RN Outcome: Adequate for Discharge Goal: Diagnostic test results will improve 01/20/2021 1143 by Genevie Ann, RN Outcome: Adequate for Discharge 01/20/2021 1143 by Genevie Ann, RN Outcome: Adequate for Discharge Goal: Respiratory complications will improve 01/20/2021 1143 by Genevie Ann, RN Outcome: Adequate for Discharge 01/20/2021 1143 by Genevie Ann, RN Outcome: Adequate for Discharge Goal: Cardiovascular complication will be avoided 01/20/2021 1143 by Genevie Ann, RN Outcome: Adequate for Discharge 01/20/2021 1143 by Genevie Ann, RN Outcome: Adequate for Discharge   Problem: Clinical Measurements: Goal: Will remain free from infection 01/20/2021 1143 by Genevie Ann, RN Outcome: Adequate for Discharge 01/20/2021 1143 by Genevie Ann, RN Outcome: Adequate for Discharge   Problem: Activity: Goal: Risk for activity intolerance will decrease 01/20/2021 1143  by Genevie Ann, RN Outcome: Adequate for Discharge 01/20/2021 1143 by Genevie Ann, RN Outcome: Adequate for Discharge   Problem: Nutrition: Goal: Adequate nutrition will be maintained 01/20/2021 1143 by Genevie Ann, RN Outcome: Adequate for Discharge 01/20/2021 1143 by Genevie Ann, RN Outcome: Adequate for Discharge   Problem: Coping: Goal: Level of anxiety will decrease 01/20/2021 1143 by Genevie Ann, RN Outcome: Adequate for Discharge 01/20/2021 1143 by Genevie Ann, RN Outcome: Adequate for Discharge   Problem: Elimination: Goal: Will not experience complications related to bowel motility 01/20/2021 1143 by Genevie Ann, RN Outcome: Adequate for Discharge 01/20/2021 1143 by Genevie Ann, RN Outcome: Adequate for Discharge Goal: Will not experience complications related to urinary retention 01/20/2021 1143 by Genevie Ann, RN Outcome: Adequate for Discharge 01/20/2021 1143 by Genevie Ann, RN Outcome: Adequate for Discharge   Problem: Pain Managment: Goal: General experience of comfort will improve 01/20/2021 1143 by Genevie Ann, RN Outcome: Adequate for Discharge 01/20/2021 1143 by Genevie Ann, RN Outcome: Adequate for Discharge   Problem: Safety: Goal: Ability to remain free from injury will improve 01/20/2021 1143 by Genevie Ann, RN Outcome: Adequate for Discharge 01/20/2021 1143 by Genevie Ann, RN Outcome: Adequate for Discharge   Problem: Skin Integrity: Goal: Risk for impaired skin integrity will decrease 01/20/2021 1143 by Genevie Ann, RN Outcome: Adequate for Discharge 01/20/2021 1143 by Genevie Ann, RN Outcome: Adequate for Discharge

## 2021-01-20 NOTE — Progress Notes (Signed)
Discharge instructions, RX's and follow appt explaoned and provided to patient and c/g verbalized understanding. Patient left floor via wheelchair accompanied by staff. No c/o pain or shortness of breath at d/c.  Melodie Ashworth, Kae Heller, RN

## 2021-01-20 NOTE — Discharge Summary (Signed)
Physician Discharge Summary  FEIGE LOWDERMILK KDX:[id number] DOB: 29-May-1981 DOA: 01/16/2021  PCP: [name]  Admit date: 01/16/2021  Discharge date: 01/20/2021  Admitted From:Home  Disposition:  Home  Recommendations for Outpatient Follow-up:  Follow up with PCP in 1-2 weeks Continue on Keflex as prescribed for 7 more days to complete total 10-day course of treatment for cellulitis Continue on metoprolol as prescribed for treatment of SVT and follow-up with PCP outpatient.  May consider cardiology referral if heart rates do not remain controlled Continue home medications as prescribed otherwise  Home Health: None  Equipment/Devices: None  Discharge Condition:Stable  CODE STATUS: Full  Diet recommendation: Heart Healthy/carb modified  Brief/Interim Summary:  40 y.o. female with medical history significant of anxiety, left carpal tunnel syndrome, chronic abdominal pain, gastroparesis, GERD, depression, type II DM, cholelithiasis, headaches, hypothyroidism, sleep apnea not on CPAP, lymphedema, history of recurrent cellulitis of the LLE who is coming to the emergency department with complaints of chills, fever over 101.4 F at home associated with edema, erythema, calor and TTP of left inner thigh area typical of her previous cellulitis episodes in the same area.  Patient was admitted with severe sepsis present on admission secondary to area of cellulitis of the left lower extremity.  She was started on IV Rocephin as well as vancomycin and MRSA PCR had returned negative.  Ultrasound of the lower extremities did not demonstrate any DVTs or findings of abscess.  She has had significant improvement in her cellulitis with less tenderness to palpation and less erythema noted.  She has had stable blood glucose control and is stable for discharge today to her usual home environment and is to continue taking Keflex as prescribed for 7 more days to complete total 10-day course of  treatment.  She was noted to have some episodes of SVT during this admission which required some monitoring in the ICU setting, but 2D echocardiogram did not demonstrate any significant abnormalities and results are noted below.  She will remain on metoprolol as ordered for heart rate control and will need follow-up outpatient.  Discharge Diagnoses:  Principal Problem:   Sepsis due to cellulitis [facility]) Active Problems:   Hyponatremia   Sleep apnea   Sepsis (HCC)   GERD (gastroesophageal reflux disease)   Uncontrolled type 2 diabetes mellitus with hyperglycemia, with long-term current use of insulin (HCC)   Hypothyroid   Lymphedema   Class 3 obesity   Gastroparesis   Hypokalemia   Hypomagnesemia   Elevated troponin  Principal discharge diagnosis: Severe sepsis present on admission secondary to left lower extremity cellulitis.  Discharge Instructions  Discharge Instructions     Diet - low sodium heart healthy   Complete by: As directed    Increase activity slowly   Complete by: As directed       Allergies as of 01/20/2021       Reactions   Paxil [paroxetine Hcl]    Suicidal thoughts   Morphine And Related Other (See Comments)   "it just freaks me out"   Latex Itching, Swelling, Rash   Sulfa Antibiotics Nausea And Vomiting, Rash        Medication List     TAKE these medications    cephALEXin 500 MG capsule Commonly known as: KEFLEX Take 1 capsule (500 mg total) by mouth 4 (four) times daily for 7 days.   insulin aspart 100 UNIT/ML injection Commonly known as: novoLOG SLIDING SCALE 3 TIMES DAILY WITH MEALS: For blood sugar 70-120-no insulin.  Blood sugar 121-150 take 5 units. Blood sugar 151-208 take 8 units. Blood sugar 201-250 take 12 units. Blood sugar 251-300 take 17 units. Blood sugar 301-350 take 20 units. Blood sugar 351-400 take 25 units. Blood sugar greater than 400 take 28 units and call your doctor. What changed:  how much to take how to take  this when to take this additional instructions   insulin glargine 100 UNIT/ML injection Commonly known as: LANTUS Inject 0.65 mLs (65 Units total) into the skin at bedtime. What changed: how much to take   metoprolol succinate 50 MG 24 hr tablet Commonly known as: TOPROL-XL Take 1 tablet (50 mg total) by mouth daily. Take with or immediately following a meal. Start taking on: January 21, 2021   ondansetron 8 MG disintegrating tablet Commonly known as: ZOFRAN-ODT Take 8 mg by mouth every 8 (eight) hours as needed for nausea or vomiting.   SSD 1 % cream Generic drug: silver sulfADIAZINE Apply 1 application topically daily as needed (rash/burns).   Victoza 18 MG/3ML Sopn Generic drug: liraglutide Inject 1.8 mg into the skin at bedtime.        Follow-up Information     [place], [facility]. Schedule an appointment as soon as possible for a visit in 1 week(s).   Specialty: Internal Medicine Contact information: [address] [place] [address] [phone]                Allergies  Allergen Reactions   Paxil [Paroxetine Hcl]     Suicidal thoughts   Morphine And Related Other (See Comments)    "it just freaks me out"   Latex Itching, Swelling and Rash   Sulfa Antibiotics Nausea And Vomiting and Rash    Consultations: None   Procedures/Studies: DG Chest 2 View  Result Date: 01/16/2021 CLINICAL DATA:  Chills and fever EXAM: CHEST - 2 VIEW COMPARISON:  Radiograph 08/14/2019 FINDINGS: Some increased attenuation towards the lung bases is largely attributable to patient body habitus. Question some mild airways thickening. No consolidation, features of edema, pneumothorax, or effusion. Pulmonary vascularity is normally distributed. The cardiomediastinal contours are unremarkable. No acute osseous or soft tissue abnormality. IMPRESSION: Questionable mild airways thickening. Could be seen with bronchitis or reactive airways disease. Lungs are otherwise clear  accounting for body habitus. Electronically Signed   By: Kreg Shropshire M.D.   On: 01/16/2021 23:20   US Venous Img Lower Bilateral (DVT)  Result Date: 01/17/2021 CLINICAL DATA:  Bilateral lower extremity cellulitis. Morbidly obese. EXAM: BILATERAL LOWER EXTREMITY VENOUS DOPPLER ULTRASOUND TECHNIQUE: Gray-scale sonography with compression, as well as color and duplex ultrasound, were performed to evaluate the deep venous system(s) from the level of the common femoral vein through the popliteal and proximal calf veins. COMPARISON:  None. FINDINGS: VENOUS Normal compressibility of the common femoral, superficial femoral, and popliteal veins, as well as the visualized calf veins. Visualized portions of profunda femoral vein and great saphenous vein unremarkable. No filling defects to suggest DVT on grayscale or color Doppler imaging. Doppler waveforms show normal direction of venous flow, normal respiratory plasticity and response to augmentation. Limited views of the contralateral common femoral vein are unremarkable. OTHER None. Limitations: Study is significantly limited due to body habitus and patient's inability to lie down due to shortness of breath. RIGHT calf veins not able to be seen. IMPRESSION: No obvious evidence of DVT within either lower extremity, but with significant study limitations detailed above. Electronically Signed   By: Bary Richard  M.D.   On: 01/17/2021 10:17   ECHOCARDIOGRAM COMPLETE  Result Date: 01/18/2021    ECHOCARDIOGRAM REPORT   Patient Name:   MANAAL MANDALA Date of Exam: 01/18/2021 Medical Rec #:  [id number]        Height:       72.0 in Accession #:    [id number]       Weight:       553.0 lb Date of Birth:  12-Nov-1980        BSA:          3.283 m Patient Age:    40 years         BP:           133/90 mmHg Patient Gender: F                HR:           94 bpm. [facility] Procedure: 2D Echo, Cardiac Doppler and Color Doppler Indications:    SVT  History:         Patient has no prior history of Echocardiogram examinations.                 Risk Factors:Diabetes.  Sonographer:    Mikki Harbor Referring Phys: [id number] DAVID MANUEL ORTIZ IMPRESSIONS  1. Left ventricular ejection fraction, by estimation, is 60 to 65%. The left ventricle has normal function. The left ventricle has no regional wall motion abnormalities. There is mild left ventricular hypertrophy. Left ventricular diastolic parameters were normal.  2. Right ventricular systolic function is normal. The right ventricular size is normal. Tricuspid regurgitation signal is inadequate for assessing PA pressure.  3. The mitral valve is normal in structure. No evidence of mitral valve regurgitation. No evidence of mitral stenosis.  4. The aortic valve has an indeterminant number of cusps. Aortic valve regurgitation is not visualized. No aortic stenosis is present. FINDINGS  Left Ventricle: Left ventricular ejection fraction, by estimation, is 60 to 65%. The left ventricle has normal function. The left ventricle has no regional wall motion abnormalities. The left ventricular internal cavity size was normal in size. There is  mild left ventricular hypertrophy. Left ventricular diastolic parameters were normal. Right Ventricle: The right ventricular size is normal. No increase in right ventricular wall thickness. Right ventricular systolic function is normal. Tricuspid regurgitation signal is inadequate for assessing PA pressure. Left Atrium: Left atrial size was normal in size. Right Atrium: Right atrial size was normal in size. Pericardium: There is no evidence of pericardial effusion. Mitral Valve: The mitral valve is normal in structure. No evidence of mitral valve regurgitation. No evidence of mitral valve stenosis. MV peak gradient, 6.0 mmHg. The mean mitral valve gradient is 3.0 mmHg. Tricuspid Valve: The tricuspid valve is normal in structure. Tricuspid valve regurgitation is not demonstrated. No evidence of  tricuspid stenosis. Aortic Valve: The aortic valve has an indeterminant number of cusps. Aortic valve regurgitation is not visualized. No aortic stenosis is present. Aortic valve mean gradient measures 6.0 mmHg. Aortic valve peak gradient measures 9.6 mmHg. Aortic valve area, by VTI measures 2.19 cm. Pulmonic Valve: The pulmonic valve was not well visualized. Pulmonic valve regurgitation is not visualized. No evidence of pulmonic stenosis. Aorta: The aortic root is normal in size and structure. IAS/Shunts: The interatrial septum was not well visualized.  LEFT VENTRICLE PLAX 2D LVIDd:         5.46 cm  Diastology LVIDs:  3.05 cm  LV e' medial:    15.20 cm/s LV PW:         1.24 cm  LV E/e' medial:  6.3 LV IVS:        1.20 cm  LV e' lateral:   14.90 cm/s LVOT diam:     1.90 cm  LV E/e' lateral: 6.4 LV SV:         79 LV SV Index:   24 LVOT Area:     2.84 cm  RIGHT VENTRICLE RV Basal diam:  2.70 cm RV Mid diam:    2.61 cm RV S prime:     14.80 cm/s TAPSE (M-mode): 2.8 cm LEFT ATRIUM             Index       RIGHT ATRIUM           Index LA diam:        4.40 cm 1.34 cm/m  RA Area:     12.80 cm LA Vol (A2C):   56.2 ml 17.12 ml/m RA Volume:   29.40 ml  8.96 ml/m LA Vol (A4C):   54.0 ml 16.45 ml/m LA Biplane Vol: 54.5 ml 16.60 ml/m  AORTIC VALVE AV Area (Vmax):    2.12 cm AV Area (Vmean):   2.13 cm AV Area (VTI):     2.19 cm AV Vmax:           155.00 cm/s AV Vmean:          114.000 cm/s AV VTI:            0.362 m AV Peak Grad:      9.6 mmHg AV Mean Grad:      6.0 mmHg LVOT Vmax:         116.00 cm/s LVOT Vmean:        85.500 cm/s LVOT VTI:          0.280 m LVOT/AV VTI ratio: 0.77  AORTA Ao Root diam: 3.00 cm MITRAL VALVE MV Area (PHT): 4.46 cm    SHUNTS MV Area VTI:   2.99 cm    Systemic VTI:  0.28 m MV Peak grad:  6.0 mmHg    Systemic Diam: 1.90 cm MV Mean grad:  3.0 mmHg MV Vmax:       1.22 m/s MV Vmean:      78.2 cm/s MV Decel Time: 170 msec MV E velocity: 95.80 cm/s MV A velocity: 80.55 cm/s MV E/A ratio:   1.19 Dina Rich MD Electronically signed by Dina Rich MD Signature Date/Time: 01/18/2021/11:02:16 AM    Final      Discharge Exam: Vitals:   01/19/21 2200 01/20/21 0500  BP:    Pulse:    Resp:    Temp: 97.9 F (36.6 C) 97.7 F (36.5 C)  SpO2:     Vitals:   01/19/21 1623 01/19/21 2000 01/19/21 2200 01/20/21 0500  BP:  (!) 143/51    Pulse: 81 77    Resp:      Temp: 97.9 F (36.6 C)  97.9 F (36.6 C) 97.7 F (36.5 C)  TempSrc: Oral  Oral Oral  SpO2: 98% 99%    Weight:      Height:        General: Pt is alert, awake, not in acute distress, morbidly obese Cardiovascular: RRR, S1/S2 +, no rubs, no gallops Respiratory: CTA bilaterally, no wheezing, no rhonchi Abdominal: Soft, NT, ND, bowel sounds + Extremities: Left lower extremity erythema and tenderness improving  The results of significant diagnostics from this hospitalization (including imaging, microbiology, ancillary and laboratory) are listed below for reference.     Microbiology: Recent Results (from the past 240 hour(s))  Resp Panel by RT-PCR (Flu A&B, Covid) Nasopharyngeal Swab     Status: None   Collection Time: 01/16/21 11:46 PM   Specimen: Nasopharyngeal Swab; Nasopharyngeal(NP) swabs in vial transport medium  Result Value Ref Range Status   SARS Coronavirus 2 by RT PCR NEGATIVE NEGATIVE Final    Comment: (NOTE) SARS-CoV-2 target nucleic acids are NOT DETECTED.  The SARS-CoV-2 RNA is generally detectable in upper respiratory specimens during the acute phase of infection. The lowest concentration of SARS-CoV-2 viral copies this assay can detect is 138 copies/mL. A negative result does not preclude SARS-Cov-2 infection and should not be used as the sole basis for treatment or other patient management decisions. A negative result may occur with  improper specimen collection/handling, submission of specimen other than nasopharyngeal swab, presence of viral mutation(s) within the areas targeted  by this assay, and inadequate number of viral copies(<138 copies/mL). A negative result must be combined with clinical observations, patient history, and epidemiological information. The expected result is Negative.  Fact Sheet for Patients:  BloggerCourse.com  Fact Sheet for Healthcare Providers:  SeriousBroker.it  This test is no t yet approved or cleared by the [place] FDA and  has been authorized for detection and/or diagnosis of SARS-CoV-2 by FDA under an Emergency Use Authorization (EUA). This EUA will remain  in effect (meaning this test can be used) for the duration of the COVID-19 declaration under Section 564(b)(1) of the Act, 21 U.S.C.section 360bbb-3(b)(1), unless the authorization is terminated  or revoked sooner.       Influenza A by PCR NEGATIVE NEGATIVE Final   Influenza B by PCR NEGATIVE NEGATIVE Final    Comment: (NOTE) The Xpert Xpress SARS-CoV-2/FLU/RSV plus assay is intended as an aid in the diagnosis of influenza from Nasopharyngeal swab specimens and should not be used as a sole basis for treatment. Nasal washings and aspirates are unacceptable for Xpert Xpress SARS-CoV-2/FLU/RSV testing.  Fact Sheet for Patients: BloggerCourse.com  Fact Sheet for Healthcare Providers: SeriousBroker.it  This test is not yet approved or cleared by the [place] FDA and has been authorized for detection and/or diagnosis of SARS-CoV-2 by FDA under an Emergency Use Authorization (EUA). This EUA will remain in effect (meaning this test can be used) for the duration of the COVID-19 declaration under Section 564(b)(1) of the Act, 21 U.S.C. section 360bbb-3(b)(1), unless the authorization is terminated or revoked.  Performed at [facility], [address] [place], [place] [address]   Blood Culture (routine x 2)     Status: None (Preliminary result)   Collection  Time: 01/16/21 11:46 PM   Specimen: BLOOD RIGHT ARM  Result Value Ref Range Status   Specimen Description BLOOD RIGHT ARM  Final   Special Requests   Final    Blood Culture results may not be optimal due to an excessive volume of blood received in culture bottles BOTTLES DRAWN AEROBIC AND ANAEROBIC   Culture   Final    NO GROWTH 3 DAYS Performed at [facility], [address], [place], [place] [address]    Report Status PENDING  Incomplete  Blood Culture (routine x 2)     Status: None (Preliminary result)   Collection Time: 01/16/21 11:46 PM   Specimen: BLOOD RIGHT FOREARM  Result Value Ref Range Status   Specimen Description BLOOD  RIGHT FOREARM  Final   Special Requests   Final    Blood Culture results may not be optimal due to an excessive volume of blood received in culture bottles BOTTLES DRAWN AEROBIC ONLY   Culture   Final    NO GROWTH 3 DAYS Performed at [facility], [address] [place], [place] [address]    Report Status PENDING  Incomplete  Urine culture     Status: None   Collection Time: 01/17/21 12:43 AM   Specimen: In/Out Cath Urine  Result Value Ref Range Status   Specimen Description   Final    IN/OUT CATH URINE Performed at [facility], [address] [place], [place] [address]    Special Requests   Final    NONE Performed at [facility], [address] [place], [place] [address]    Culture   Final    NO GROWTH Performed at [facility], [address] [place], [place] [address]    Report Status 01/18/2021 FINAL  Final  MRSA Next Gen by PCR, Nasal     Status: None   Collection Time: 01/17/21  4:54 AM   Specimen: Nasal Mucosa; Nasal Swab  Result Value Ref Range Status   MRSA by PCR Next Gen NOT DETECTED NOT DETECTED Final    Comment: (NOTE) The GeneXpert MRSA Assay (FDA approved for NASAL specimens only), is one component of a comprehensive MRSA colonization surveillance program. It is not intended to diagnose MRSA infection nor to guide or  monitor treatment for MRSA infections. Test performance is not FDA approved in patients less than 42 years old. Performed at [facility], [address] [place], [place] [address]      Labs: BNP (last 3 results) Recent Labs    01/17/21 2107  BNP 119.0*   Basic Metabolic Panel: Recent Labs  Lab 01/17/21 0732 01/17/21 2106 01/18/21 0631 01/18/21 1049 01/19/21 0450 01/20/21 0529  NA 133* 132* 136  --  136 137  K 4.1 3.4* 4.0  --  4.0 4.0  CL 102 104 106  --  105 107  CO2 23 22 22   --  24 22  GLUCOSE 211* 92 130*  --  109* 97  BUN 17 19 14   --  11 9  CREATININE 1.13* 1.06* 0.95  --  0.86 0.79  CALCIUM 8.2* 8.0* 8.2*  --  8.3* 8.5*  MG  --  1.5*  --  2.0 2.1  --    Liver Function Tests: Recent Labs  Lab 01/16/21 2311 01/18/21 0631 01/19/21 0450 01/20/21 0529  AST 23 30 24 22   ALT 25 19 16 16   ALKPHOS 94 67 67 72  BILITOT 1.0 0.9 0.6 0.7  PROT 8.6* 6.6 6.7 6.8  ALBUMIN 3.9 2.9* 2.8* 2.8*   No results for input(s): LIPASE, AMYLASE in the last 168 hours. No results for input(s): AMMONIA in the last 168 hours. CBC: Recent Labs  Lab 01/16/21 2311 01/17/21 0732 01/17/21 2107 01/18/21 0631 01/19/21 0450 01/20/21 0529  WBC 16.2* 22.7*  --  5.6 4.2 4.8  NEUTROABS 15.0*  --   --  4.3 2.6 2.9  HGB 14.8 12.3 11.8* 11.5* 11.1* 11.4*  HCT 47.3* 38.9 37.2 38.4 36.6 36.7  MCV 90.4 90.3  --  92.8 93.1 91.8  PLT 212 223  --  147* 159 167   Cardiac Enzymes: No results for input(s): CKTOTAL, CKMB, CKMBINDEX, TROPONINI in the last 168 hours. BNP: Invalid input(s): POCBNP CBG: Recent Labs  Lab 01/19/21 0712 01/19/21 1117 01/19/21 1622 01/19/21 Dec 02, 2156 01/20/21 0809  GLUCAP 106* 118* 109* 91 103*   D-Dimer No results for input(s): DDIMER in the last 72 hours. Hgb A1c No results for input(s): HGBA1C in the last 72 hours. Lipid Profile No results for input(s): CHOL, HDL, LDLCALC, TRIG, CHOLHDL, LDLDIRECT in the last 72 hours. Thyroid function studies Recent Labs     01/17/21 12/02/05  TSH 3.676   Anemia work up No results for input(s): VITAMINB12, FOLATE, FERRITIN, TIBC, IRON, RETICCTPCT in the last 72 hours. Urinalysis    Component Value Date/Time   COLORURINE YELLOW 01/17/2021 0043   APPEARANCEUR CLEAR 01/17/2021 0043   LABSPEC 1.019 01/17/2021 0043   PHURINE 5.0 01/17/2021 0043   GLUCOSEU NEGATIVE 01/17/2021 0043   HGBUR SMALL (A) 01/17/2021 0043   BILIRUBINUR NEGATIVE 01/17/2021 0043   KETONESUR NEGATIVE 01/17/2021 0043   PROTEINUR NEGATIVE 01/17/2021 0043   UROBILINOGEN 0.2 02/22/2013 0414   NITRITE NEGATIVE 01/17/2021 0043   LEUKOCYTESUR NEGATIVE 01/17/2021 0043   Sepsis Labs Invalid input(s): PROCALCITONIN,  WBC,  LACTICIDVEN Microbiology Recent Results (from the past 240 hour(s))  Resp Panel by RT-PCR (Flu A&B, Covid) Nasopharyngeal Swab     Status: None   Collection Time: 01/16/21 11:46 PM   Specimen: Nasopharyngeal Swab; Nasopharyngeal(NP) swabs in vial transport medium  Result Value Ref Range Status   SARS Coronavirus 2 by RT PCR NEGATIVE NEGATIVE Final    Comment: (NOTE) SARS-CoV-2 target nucleic acids are NOT DETECTED.  The SARS-CoV-2 RNA is generally detectable in upper respiratory specimens during the acute phase of infection. The lowest concentration of SARS-CoV-2 viral copies this assay can detect is 138 copies/mL. A negative result does not preclude SARS-Cov-2 infection and should not be used as the sole basis for treatment or other patient management decisions. A negative result may occur with  improper specimen collection/handling, submission of specimen other than nasopharyngeal swab, presence of viral mutation(s) within the areas targeted by this assay, and inadequate number of viral copies(<138 copies/mL). A negative result must be combined with clinical observations, patient history, and epidemiological information. The expected result is Negative.  Fact Sheet for Patients:   BloggerCourse.com  Fact Sheet for Healthcare Providers:  SeriousBroker.it  This test is no t yet approved or cleared by the [place] FDA and  has been authorized for detection and/or diagnosis of SARS-CoV-2 by FDA under an Emergency Use Authorization (EUA). This EUA will remain  in effect (meaning this test can be used) for the duration of the COVID-19 declaration under Section 564(b)(1) of the Act, 21 U.S.C.section 360bbb-3(b)(1), unless the authorization is terminated  or revoked sooner.       Influenza A by PCR NEGATIVE NEGATIVE Final   Influenza B by PCR NEGATIVE NEGATIVE Final    Comment: (NOTE) The Xpert Xpress SARS-CoV-2/FLU/RSV plus assay is intended as an aid in the diagnosis of influenza from Nasopharyngeal swab specimens and should not be used as a sole basis for treatment. Nasal washings and aspirates are unacceptable for Xpert Xpress SARS-CoV-2/FLU/RSV testing.  Fact Sheet for Patients: BloggerCourse.com  Fact Sheet for Healthcare Providers: SeriousBroker.it  This test is not yet approved or cleared by the [place] FDA and has been authorized for detection and/or diagnosis of SARS-CoV-2 by FDA under an Emergency Use Authorization (EUA). This EUA will remain in effect (meaning this test can be used) for the duration of the COVID-19 declaration under Section 564(b)(1) of the Act, 21 U.S.C. section 360bbb-3(b)(1), unless the authorization is  terminated or revoked.  Performed at [facility], [address], [place], [place] [address]   Blood Culture (routine x 2)     Status: None (Preliminary result)   Collection Time: 01/16/21 11:46 PM   Specimen: BLOOD RIGHT ARM  Result Value Ref Range Status   Specimen Description BLOOD RIGHT ARM  Final   Special Requests   Final    Blood Culture results may not be optimal due to an excessive volume of blood received  in culture bottles BOTTLES DRAWN AEROBIC AND ANAEROBIC   Culture   Final    NO GROWTH 3 DAYS Performed at [facility], [address] [place], [place] [address]    Report Status PENDING  Incomplete  Blood Culture (routine x 2)     Status: None (Preliminary result)   Collection Time: 01/16/21 11:46 PM   Specimen: BLOOD RIGHT FOREARM  Result Value Ref Range Status   Specimen Description BLOOD RIGHT FOREARM  Final   Special Requests   Final    Blood Culture results may not be optimal due to an excessive volume of blood received in culture bottles BOTTLES DRAWN AEROBIC ONLY   Culture   Final    NO GROWTH 3 DAYS Performed at [facility], [address], [place], [place] [address]    Report Status PENDING  Incomplete  Urine culture     Status: None   Collection Time: 01/17/21 12:43 AM   Specimen: In/Out Cath Urine  Result Value Ref Range Status   Specimen Description   Final    IN/OUT CATH URINE Performed at [facility], [address], [place], [place] [address]    Special Requests   Final    NONE Performed at [facility], [address], [place], [place] [address]    Culture   Final    NO GROWTH Performed at [facility], [address], [place], [place] [address]    Report Status 01/18/2021 FINAL  Final  MRSA Next Gen by PCR, Nasal     Status: None   Collection Time: 01/17/21  4:54 AM   Specimen: Nasal Mucosa; Nasal Swab  Result Value Ref Range Status   MRSA by PCR Next Gen NOT DETECTED NOT DETECTED Final    Comment: (NOTE) The GeneXpert MRSA Assay (FDA approved for NASAL specimens only), is one component of a comprehensive MRSA colonization surveillance program. It is not intended to diagnose MRSA infection nor to guide or monitor treatment for MRSA infections. Test performance is not FDA approved in patients less than 35 years old. Performed at [facility], [address], [place], [place] [address]      Time coordinating discharge: 35  minutes  SIGNED:   Erick Blinks, DO [facility] 01/20/2021, 11:08 AM  If 7PM-7AM, please contact night-coverage [email or web address]

## 2021-01-23 LAB — CULTURE, BLOOD (ROUTINE X 2)
Culture: NO GROWTH
Culture: NO GROWTH

## 2021-07-29 ENCOUNTER — Ambulatory Visit: Payer: Medicaid Other | Admitting: Nutrition

## 2021-11-01 ENCOUNTER — Ambulatory Visit (INDEPENDENT_AMBULATORY_CARE_PROVIDER_SITE_OTHER): Payer: Medicaid Other

## 2021-11-01 ENCOUNTER — Ambulatory Visit: Payer: Medicaid Other

## 2021-11-01 ENCOUNTER — Ambulatory Visit
Admission: EM | Admit: 2021-11-01 | Discharge: 2021-11-01 | Disposition: A | Discharge status: Home / Self Care | Payer: Medicaid Other | Attending: Nurse Practitioner | Admitting: Nurse Practitioner

## 2021-11-01 DIAGNOSIS — M79641 Pain in right hand: Secondary | ICD-10-CM | POA: Diagnosis not present

## 2021-11-01 DIAGNOSIS — M654 Radial styloid tenosynovitis [de Quervain]: Secondary | ICD-10-CM | POA: Diagnosis not present

## 2021-11-01 MED ORDER — IBUPROFEN 800 MG PO TABS: 800.0000 mg | ORAL_TABLET | Freq: Three times a day (TID) | ORAL | 0 refills | Status: DC | PRN

## 2021-11-01 NOTE — ED Provider Notes (Signed)
?[place] ? ? ? ?CSN: [id number] ?Arrival date & time: 11/01/21  1704 ? ? ?  ? ?History   ?Chief Complaint ?Chief Complaint  ?Patient presents with  ? Hand Pain  ? ? ?HPI ?Brittany [name] is a 41 y.o. female.  ? ?The patient is a 41 year old female who presents for pain in the right thumb.  Symptoms started about 2 days ago.  Patient states she woke up with pain in the thumb, and noticed increased pain when she went to lift a drink.  She states that the pain radiates into the right wrist.  She denies injury, trauma, numbness, tingling.  She does report that she has noticed some intermittent swelling in the right thumb.  She reports that she has been using a mouse most of the time. ? ?The history is provided by the patient.  ? ?Past Medical History:  ?Diagnosis Date  ? Anxiety   ? Carpal tunnel syndrome on left 10/12/2016  ? Cellulitis and abscess of leg   ? Chronic abdominal pain   ? Depression   ? Diabetes mellitus without complication ([place])   ? Gallstones   ? Gastroparesis   ? GERD (gastroesophageal reflux disease) 08/13/2017  ? Headache   ? Hypothyroid 01/03/2018  ? Lymphedema   ? Sleep apnea   ? DX with sleep apnea - unable to use c-pap  ? Umbilical hernia   ? ? ?Patient Active Problem List  ? Diagnosis Date Noted  ? Hypokalemia 01/17/2021  ? Hypomagnesemia 01/17/2021  ? Elevated troponin 01/17/2021  ? Lymphedema   ? Class 3 obesity (HCC)   ? Gastroparesis   ? Gastritis and gastroduodenitis   ? Sepsis due to cellulitis ([place]) 03/16/2018  ? Hypothyroid 01/03/2018  ? GERD (gastroesophageal reflux disease) 08/13/2017  ? Uncontrolled type 2 diabetes mellitus with hyperglycemia, with long-term current use of insulin ([place]) 08/13/2017  ? Sepsis ([place]) 08/11/2017  ? Chronic abdominal pain   ? Pelvic lymphadenopathy   ? Splenomegaly   ? Nausea and vomiting   ? Encounter for IUD removal 04/20/2017  ? Pressure injury of skin 10/24/2016  ? Cellulitis and abscess of leg 10/23/2016  ? Sleep apnea 10/23/2016  ? Diabetes  mellitus without complication ([place]) 99991111  ? Morbid obesity with body mass index (BMI) greater than or equal to 70 in adult [facility]) 10/23/2016  ? Carpal tunnel syndrome on left 10/12/2016  ? Candidal intertrigo 12/31/2014  ? Morbid obesity ([place]) 12/29/2014  ? Hyponatremia 12/29/2014  ? Cellulitis of left lower extremity 12/29/2014  ? ? ?Past Surgical History:  ?Procedure Laterality Date  ? BIOPSY  11/11/2019  ? Procedure: BIOPSY;  Surgeon: Thornton Park, MD;  Location: [facility];  Service: Gastroenterology;;  ? CESAREAN SECTION  11/09/01  ? ESOPHAGOGASTRODUODENOSCOPY (EGD) WITH PROPOFOL N/A 11/11/2019  ? Procedure: ESOPHAGOGASTRODUODENOSCOPY (EGD) WITH PROPOFOL;  Surgeon: Thornton Park, MD;  Location: [facility];  Service: Gastroenterology;  Laterality: N/A;  ? HERNIA REPAIR    ? INSERTION OF MESH N/A 01/02/2013  ? Procedure: INSERTION OF MESH;  Surgeon: Madilyn Hook, DO;  Location: [facility];  Service: General;  Laterality: N/A;  ? UMBILICAL HERNIA REPAIR N/A 01/02/2013  ? Procedure: LAPAROSCOPIC UMBILICAL HERNIA;  Surgeon: Madilyn Hook, DO;  Location: [facility];  Service: General;  Laterality: N/A;  ? ? ?OB History   ? ? Gravida  ?4  ? Para  ?1  ? Term  ?1  ? Preterm  ?   ? AB  ?2  ? Living  ?  1  ?  ? ? SAB  ?2  ? IAB  ?   ? Ectopic  ?   ? Multiple  ?   ? Live Births  ?1  ?   ?  ?  ? ? ? ?Home Medications   ? ?Prior to Admission medications   ?Medication Sig Start Date End Date Taking? Authorizing Provider  ?ibuprofen (ADVIL) 800 MG tablet Take 1 tablet (800 mg total) by mouth every 8 (eight) hours as needed. 11/01/21  Yes Kamora Vossler-Warren, Alda Lea, NP  ?insulin aspart (NOVOLOG) 100 UNIT/ML injection SLIDING SCALE 3 TIMES DAILY WITH MEALS: For blood sugar 70-120-no insulin. Blood sugar 121-150 take 5 units. Blood sugar 151-208 take 8 units. Blood sugar 201-250 take 12 units. Blood sugar 251-300 take 17 units. Blood sugar 301-350 take 20 units. Blood sugar 351-400 take 25 units. Blood sugar greater than 400 take 28  units and call your doctor. 03/27/16   Rolland Porter, MD  ?insulin glargine (LANTUS) 100 UNIT/ML injection Inject 0.65 mLs (65 Units total) into the skin at bedtime. 08/13/17   Dhungel, Flonnie Overman, MD  ?metoprolol succinate (TOPROL-XL) 50 MG 24 hr tablet Take 1 tablet (50 mg total) by mouth daily. Take with or immediately following a meal. 01/21/21 02/20/21  Manuella Ghazi, Pratik D, DO  ?ondansetron (ZOFRAN-ODT) 8 MG disintegrating tablet Take 8 mg by mouth every 8 (eight) hours as needed for nausea or vomiting.    [provider]  ?SSD 1 % cream Apply 1 application topically daily as needed (rash/burns).  09/19/19   [provider]  ?VICTOZA 18 MG/3ML SOPN Inject 1.8 mg into the skin at bedtime. 07/11/19   [provider]  ? ? ?Family History ?Family History  ?Problem Relation Age of Onset  ? Cancer Mother   ?     leukemia  ? Diabetes Mother   ? Hypertension Mother   ? Hyperlipidemia Mother   ? Other Father   ?     Sciatica, kidney stones  ? Alcohol abuse Paternal Grandfather   ? Other Paternal Grandmother   ?     bowel obstruction; pneumonia  ? Hypertension Maternal Grandmother   ? Diabetes Maternal Grandmother   ? Cancer Maternal Grandfather   ?     lung  ? ADD / ADHD Daughter   ? Depression Daughter   ? Anxiety disorder Daughter   ? ? ?Social History ?Social History  ? ?Tobacco Use  ? Smoking status: Never  ? Smokeless tobacco: Never  ?Vaping Use  ? Vaping Use: Never used  ?Substance Use Topics  ? Alcohol use: No  ? Drug use: No  ? ? ? ?Allergies   ?Paxil [paroxetine hcl], Morphine and related, Latex, and Sulfa antibiotics ? ? ?Review of Systems ?Review of Systems  ?Constitutional: Negative.   ?Musculoskeletal:  Positive for joint swelling (right thumb).  ?     Right thumb pain  ?Skin: Negative.   ?Psychiatric/Behavioral: Negative.    ? ? ?Physical Exam ?Triage Vital Signs ?ED Triage Vitals  ?Enc Vitals Group  ?   BP 11/01/21 1800 (!) 152/102  ?   Pulse Rate 11/01/21 1800 94  ?   Resp 11/01/21 1800 20  ?    Temp 11/01/21 1800 98.6 ?F (37 ?C)  ?   Temp src --   ?   SpO2 11/01/21 1800 94 %  ?   Weight --   ?   Height --   ?   Head Circumference --   ?  Peak Flow --   ?   Pain Score 11/01/21 1758 8  ?   Pain Loc --   ?   Pain Edu? --   ?   Excl. in [place]? --   ? ?No data found. ? ?Updated Vital Signs ?BP (!) 152/102   Pulse 94   Temp 98.6 ?F (37 ?C)   Resp 20   SpO2 94%  ? ?Visual Acuity ?Right Eye Distance:   ?Left Eye Distance:   ?Bilateral Distance:   ? ?Right Eye Near:   ?Left Eye Near:    ?Bilateral Near:    ? ?Physical Exam ?Vitals reviewed.  ?Constitutional:   ?   Appearance: Normal appearance.  ?HENT:  ?   Head: Normocephalic and atraumatic.  ?Pulmonary:  ?   Effort: Pulmonary effort is normal.  ?Musculoskeletal:  ?   Right hand: Tenderness present. No lacerations. Normal range of motion. Decreased strength of finger abduction. Normal sensation. Normal capillary refill. Normal pulse.  ?   Cervical back: Normal range of motion.  ?   Comments: Tenderness to the snuffbox area of the right thumb. Swelling noted when compared to opposing thumb. + FROM, but with pain. No bruising, redness or warmth observed.  ?Neurological:  ?   Mental Status: She is alert.  ? ? ? ?UC Treatments / Results  ?Labs ?(all labs ordered are listed, but only abnormal results are displayed) ?Labs Reviewed - No data to display ? ?EKG ? ? ?Radiology ?DG Hand Complete Right ? ?Result Date: 11/01/2021 ?CLINICAL DATA:  Hand pain x3 days EXAM: RIGHT HAND - COMPLETE 3+ VIEW COMPARISON:  None. FINDINGS: No fracture or dislocation is seen. The joint spaces are preserved. Visualized soft tissues are within normal limits. IMPRESSION: Negative. Electronically Signed   By: Julian Hy M.D.   On: 11/01/2021 19:11   ? ?Procedures ?Procedures (including critical care time) ? ?Medications Ordered in UC ?Medications - No data to display ? ?Initial Impression / Assessment and Plan / UC Course  ?I have reviewed the triage vital signs and the nursing  notes. ? ?Pertinent labs & imaging results that were available during my care of the patient were reviewed by me and considered in my medical decision making (see chart for details). ? ?The patient is a 40-yea

## 2021-11-01 NOTE — Discharge Instructions (Addendum)
Your x-ray is negative for fracture or dislocation. ?Your symptoms appear to be consistent with de Quervain's tenosynovitis.  You may need to follow-up with your primary care or orthopedics for further evaluation. ?Take ibuprofen to help with pain and inflammation.  Take medication with food and water. ?I am providing a splint for your thumb to help with support and stability.  Continue gentle range of motion exercises to help with pain. ?Follow-up as needed. ?

## 2021-11-01 NOTE — ED Triage Notes (Signed)
Pt presents with right hand pain for past few days, pain is in thumb area, denies injury  ?

## 2022-02-23 ENCOUNTER — Ambulatory Visit: Payer: Medicaid Other | Admitting: Adult Health

## 2022-03-28 ENCOUNTER — Ambulatory Visit: Payer: Medicaid Other | Admitting: Adult Health

## 2022-05-11 ENCOUNTER — Ambulatory Visit: Payer: Medicaid Other | Admitting: Adult Health

## 2023-02-13 ENCOUNTER — Other Ambulatory Visit: Payer: Self-pay

## 2023-02-13 ENCOUNTER — Observation Stay (HOSPITAL_COMMUNITY)
Admission: EM | Admit: 2023-02-13 | Discharge: 2023-02-16 | Disposition: A | Discharge status: Home / Self Care | Payer: Medicaid Other | Attending: Family Medicine | Admitting: Family Medicine

## 2023-02-13 ENCOUNTER — Encounter (HOSPITAL_COMMUNITY): Payer: Self-pay

## 2023-02-13 DIAGNOSIS — Z91040 Latex allergy status: Secondary | ICD-10-CM | POA: Insufficient documentation

## 2023-02-13 DIAGNOSIS — Z7985 Long-term (current) use of injectable non-insulin antidiabetic drugs: Secondary | ICD-10-CM | POA: Diagnosis not present

## 2023-02-13 DIAGNOSIS — M79605 Pain in left leg: Secondary | ICD-10-CM | POA: Diagnosis present

## 2023-02-13 DIAGNOSIS — Z794 Long term (current) use of insulin: Secondary | ICD-10-CM | POA: Insufficient documentation

## 2023-02-13 DIAGNOSIS — M79606 Pain in leg, unspecified: Secondary | ICD-10-CM | POA: Diagnosis present

## 2023-02-13 DIAGNOSIS — L03115 Cellulitis of right lower limb: Secondary | ICD-10-CM | POA: Diagnosis not present

## 2023-02-13 DIAGNOSIS — R531 Weakness: Secondary | ICD-10-CM | POA: Diagnosis not present

## 2023-02-13 DIAGNOSIS — E039 Hypothyroidism, unspecified: Secondary | ICD-10-CM | POA: Insufficient documentation

## 2023-02-13 DIAGNOSIS — E119 Type 2 diabetes mellitus without complications: Secondary | ICD-10-CM

## 2023-02-13 DIAGNOSIS — E1165 Type 2 diabetes mellitus with hyperglycemia: Secondary | ICD-10-CM | POA: Diagnosis not present

## 2023-02-13 DIAGNOSIS — B372 Candidiasis of skin and nail: Secondary | ICD-10-CM | POA: Insufficient documentation

## 2023-02-13 DIAGNOSIS — Z6841 Body Mass Index (BMI) 40.0 and over, adult: Secondary | ICD-10-CM | POA: Diagnosis not present

## 2023-02-13 DIAGNOSIS — Z79899 Other long term (current) drug therapy: Secondary | ICD-10-CM | POA: Insufficient documentation

## 2023-02-13 LAB — CBC WITH DIFFERENTIAL/PLATELET
Abs Immature Granulocytes: 0.03 10*3/uL (ref 0.00–0.07)
Basophils Absolute: 0 10*3/uL (ref 0.0–0.1)
Basophils Relative: 1 %
Eosinophils Absolute: 0.1 10*3/uL (ref 0.0–0.5)
Eosinophils Relative: 2 %
HCT: 39.8 % (ref 36.0–46.0)
Hemoglobin: 12.4 g/dL (ref 12.0–15.0)
Immature Granulocytes: 1 %
Lymphocytes Relative: 23 %
Lymphs Abs: 1.3 10*3/uL (ref 0.7–4.0)
MCH: 27.7 pg (ref 26.0–34.0)
MCHC: 31.2 g/dL (ref 30.0–36.0)
MCV: 88.8 fL (ref 80.0–100.0)
Monocytes Absolute: 0.3 10*3/uL (ref 0.1–1.0)
Monocytes Relative: 5 %
Neutro Abs: 3.9 10*3/uL (ref 1.7–7.7)
Neutrophils Relative %: 68 %
Platelets: 187 10*3/uL (ref 150–400)
RBC: 4.48 MIL/uL (ref 3.87–5.11)
RDW: 15.1 % (ref 11.5–15.5)
WBC: 5.7 10*3/uL (ref 4.0–10.5)
nRBC: 0 % (ref 0.0–0.2)

## 2023-02-13 LAB — BASIC METABOLIC PANEL
Anion gap: 7 (ref 5–15)
BUN: 12 mg/dL (ref 6–20)
CO2: 23 mmol/L (ref 22–32)
Calcium: 8.2 mg/dL — ABNORMAL LOW (ref 8.9–10.3)
Chloride: 105 mmol/L (ref 98–111)
Creatinine, Ser: 0.89 mg/dL (ref 0.44–1.00)
GFR, Estimated: >60 mL/min (ref >60)
Glucose, Bld: 242 mg/dL — ABNORMAL HIGH (ref 70–99)
Potassium: 3.8 mmol/L (ref 3.5–5.1)
Sodium: 135 mmol/L (ref 135–145)

## 2023-02-13 MED ORDER — LACTATED RINGERS IV BOLUS
1000.0000 mL | Freq: Once | INTRAVENOUS | Status: AC
  Administered 2023-02-13: 1000 mL via INTRAVENOUS

## 2023-02-13 MED ORDER — VANCOMYCIN HCL 10 G IV SOLR: 2000.0000 mg | Freq: Once | INTRAVENOUS | Status: DC

## 2023-02-13 MED ORDER — FLUCONAZOLE 150 MG PO TABS
150.0000 mg | ORAL_TABLET | Freq: Once | ORAL | Status: AC
  Administered 2023-02-13: 150 mg via ORAL
  Filled 2023-02-13: qty 1

## 2023-02-13 MED ORDER — VANCOMYCIN HCL IN DEXTROSE 1-5 GM/200ML-% IV SOLN
1000.0000 mg | INTRAVENOUS | Status: AC
  Administered 2023-02-13 (×2): 1000 mg via INTRAVENOUS
  Filled 2023-02-13 (×2): qty 200

## 2023-02-13 MED ORDER — HYDROMORPHONE HCL 1 MG/ML IJ SOLN
1.0000 mg | Freq: Once | INTRAMUSCULAR | Status: AC
  Administered 2023-02-13: 1 mg via INTRAVENOUS
  Filled 2023-02-13: qty 1

## 2023-02-13 NOTE — ED Triage Notes (Signed)
Pt states hx of cellulitis and lymphadenia. Pt states having left lower leg pain that radiates to her left foot. No discoloration noted in triage. Pt states it "leaks fluid, and feels like cellulitis."

## 2023-02-13 NOTE — ED Provider Notes (Signed)
[place] EMERGENCY DEPARTMENT AT [facility] Provider Note   CSN: [id number] Arrival date & time: 02/13/23  1807     History  Chief Complaint  Patient presents with   Leg Pain    MATIGAN PUJOLS is a 42 y.o. female.  HPI    42 year old female with morbid obesity comes in with chief complaint of leg pain and swelling.  Patient has history of lymphedema and cellulitis of the lower extremity.  She states that the current episode started about 2 or 3 days ago.  This time the symptoms are deeper and there has been drainage.  Her husband has placed Silvadene, but her symptoms have not improved.  She gets UTI once or twice a year and needs admission with IV vancomycin.  She has had previous DVT studies that is negative.  Patient has seen some leakage of fluid in her lower extremity -and suspect that she most likely has cellulitis.  Home Medications Prior to Admission medications   Medication Sig Start Date End Date Taking? Authorizing Provider  ibuprofen (ADVIL) 800 MG tablet Take 1 tablet (800 mg total) by mouth every 8 (eight) hours as needed. 11/01/21   Leath-Warren, Sadie Haber, NP  insulin aspart (NOVOLOG) 100 UNIT/ML injection SLIDING SCALE 3 TIMES DAILY WITH MEALS: For blood sugar 70-120-no insulin. Blood sugar 121-150 take 5 units. Blood sugar 151-208 take 8 units. Blood sugar 201-250 take 12 units. Blood sugar 251-300 take 17 units. Blood sugar 301-350 take 20 units. Blood sugar 351-400 take 25 units. Blood sugar greater than 400 take 28 units and call your doctor. 03/27/16   Devoria Albe, MD  insulin glargine (LANTUS) 100 UNIT/ML injection Inject 0.65 mLs (65 Units total) into the skin at bedtime. 08/13/17   Dhungel, Theda Belfast, MD  metoprolol succinate (TOPROL-XL) 50 MG 24 hr tablet Take 1 tablet (50 mg total) by mouth daily. Take with or immediately following a meal. 01/21/21 02/20/21  Sherryll Burger, Pratik D, DO  ondansetron (ZOFRAN-ODT) 8 MG disintegrating tablet Take 8 mg by mouth every  8 (eight) hours as needed for nausea or vomiting.    [provider]  SSD 1 % cream Apply 1 application topically daily as needed (rash/burns).  09/19/19   [provider]  VICTOZA 18 MG/3ML SOPN Inject 1.8 mg into the skin at bedtime. 07/11/19   [provider]      Allergies    Paxil [paroxetine hcl], Morphine and codeine, Latex, and Sulfa antibiotics    Review of Systems   Review of Systems  All other systems reviewed and are negative.   Physical Exam Updated Vital Signs BP 122/69   Pulse 81   Temp 97.6 F (36.4 C) (Oral)   Resp (!) 22   Ht 6' (1.829 m)   Wt (!) 260.8 kg   SpO2 96%   BMI 77.98 kg/m  Physical Exam Vitals and nursing note reviewed.  Constitutional:      Appearance: She is well-developed.  HENT:     Head: Atraumatic.  Eyes:     Extraocular Movements: Extraocular movements intact.     Pupils: Pupils are equal, round, and reactive to light.  Cardiovascular:     Rate and Rhythm: Normal rate.  Pulmonary:     Effort: Pulmonary effort is normal.  Musculoskeletal:        General: Swelling and tenderness present.     Cervical back: Normal range of motion and neck supple.  Skin:    General: Skin is warm and  dry.     Findings: Erythema present.     Comments: Patient's left lower extremity has erythema and some drainage that extends into the fold.  No crepitus appreciated. Patient has tenderness over the calf  Neurological:     Mental Status: She is alert and oriented to person, place, and time.     ED Results / Procedures / Treatments   Labs (all labs ordered are listed, but only abnormal results are displayed) Labs Reviewed  BASIC METABOLIC PANEL - Abnormal; Notable for the following components:      Result Value   Glucose, Bld 242 (*)    Calcium 8.2 (*)    All other components within normal limits  CBC WITH DIFFERENTIAL/PLATELET    EKG None  Radiology No results found.  Procedures Procedures    Medications  Ordered in ED Medications  vancomycin (VANCOCIN) IVPB 1000 mg/200 mL premix (1,000 mg Intravenous New Bag/Given 02/13/23 2331)  fluconazole (DIFLUCAN) tablet 150 mg (has no administration in time range)  lactated ringers bolus 1,000 mL (1,000 mLs Intravenous New Bag/Given 02/13/23 2235)  HYDROmorphone (DILAUDID) injection 1 mg (1 mg Intravenous Given 02/13/23 2354)    ED Course/ Medical Decision Making/ A&P                                 Medical Decision Making Amount and/or Complexity of Data Reviewed Labs: ordered.  Risk Prescription drug management. Decision regarding hospitalization.  42 year old female comes in a chief complaint of left lower extremity pain and swelling.  She has history of morbid obesity, lymphedema and previous history of cellulitis in the same extremity.  Differential diagnosis includes cellulitis, Candida dermatitis, lymphadenitis, lymphangitis, DVT.  Plan is to give patient antibiotics/IV vancomycin and admit.  She will likely need barrier cream in the lower extremity as part of managing her symptoms.  DVT study can be added if not getting better.  Clinically not septic.  Final Clinical Impression(s) / ED Diagnoses Final diagnoses:  Cellulitis of right lower extremity    Rx / DC Orders ED Discharge Orders     None         Derwood Kaplan, MD 02/13/23 2357

## 2023-02-14 ENCOUNTER — Observation Stay (HOSPITAL_COMMUNITY): Payer: Medicaid Other

## 2023-02-14 DIAGNOSIS — M79605 Pain in left leg: Secondary | ICD-10-CM | POA: Diagnosis not present

## 2023-02-14 DIAGNOSIS — B372 Candidiasis of skin and nail: Secondary | ICD-10-CM

## 2023-02-14 DIAGNOSIS — M79606 Pain in leg, unspecified: Secondary | ICD-10-CM | POA: Diagnosis present

## 2023-02-14 DIAGNOSIS — E119 Type 2 diabetes mellitus without complications: Secondary | ICD-10-CM | POA: Diagnosis not present

## 2023-02-14 DIAGNOSIS — Z6841 Body Mass Index (BMI) 40.0 and over, adult: Secondary | ICD-10-CM

## 2023-02-14 DIAGNOSIS — G629 Polyneuropathy, unspecified: Secondary | ICD-10-CM

## 2023-02-14 LAB — PROCALCITONIN: Procalcitonin: <0.1 ng/mL

## 2023-02-14 LAB — GLUCOSE, CAPILLARY
Glucose-Capillary: 236 mg/dL — ABNORMAL HIGH (ref 70–99)
Glucose-Capillary: 211 mg/dL — ABNORMAL HIGH (ref 70–99)
Glucose-Capillary: 233 mg/dL — ABNORMAL HIGH (ref 70–99)
Glucose-Capillary: 227 mg/dL — ABNORMAL HIGH (ref 70–99)

## 2023-02-14 LAB — HEMOGLOBIN A1C
Hgb A1c MFr Bld: 8.6 % — ABNORMAL HIGH (ref 4.8–5.6)
Mean Plasma Glucose: 200.12 mg/dL

## 2023-02-14 MED ORDER — GABAPENTIN 100 MG PO CAPS
100.0000 mg | ORAL_CAPSULE | Freq: Three times a day (TID) | ORAL | Status: DC
  Administered 2023-02-14 – 2023-02-16 (×7): 100 mg via ORAL
  Filled 2023-02-14 (×7): qty 1

## 2023-02-14 MED ORDER — ACETAMINOPHEN 650 MG RE SUPP: 650.0000 mg | Freq: Four times a day (QID) | RECTAL | Status: DC | PRN

## 2023-02-14 MED ORDER — ACETAMINOPHEN 325 MG PO TABS: 650.0000 mg | ORAL_TABLET | Freq: Four times a day (QID) | ORAL | Status: DC | PRN

## 2023-02-14 MED ORDER — ONDANSETRON HCL 4 MG PO TABS: 4.0000 mg | ORAL_TABLET | Freq: Four times a day (QID) | ORAL | Status: DC | PRN

## 2023-02-14 MED ORDER — INSULIN ASPART 100 UNIT/ML IJ SOLN
0.0000 [IU] | Freq: Every day | INTRAMUSCULAR | Status: DC
  Administered 2023-02-14 – 2023-02-15 (×2): 2 [IU] via SUBCUTANEOUS

## 2023-02-14 MED ORDER — HEPARIN SODIUM (PORCINE) 5000 UNIT/ML IJ SOLN
5000.0000 [IU] | Freq: Three times a day (TID) | INTRAMUSCULAR | Status: DC
  Administered 2023-02-14 – 2023-02-16 (×8): 5000 [IU] via SUBCUTANEOUS
  Filled 2023-02-14 (×8): qty 1

## 2023-02-14 MED ORDER — ONDANSETRON HCL 4 MG/2ML IJ SOLN
4.0000 mg | Freq: Four times a day (QID) | INTRAMUSCULAR | Status: DC | PRN
  Administered 2023-02-14 – 2023-02-16 (×7): 4 mg via INTRAVENOUS
  Filled 2023-02-14 (×7): qty 2

## 2023-02-14 MED ORDER — INSULIN ASPART 100 UNIT/ML IJ SOLN
0.0000 [IU] | Freq: Three times a day (TID) | INTRAMUSCULAR | Status: DC
  Administered 2023-02-14 – 2023-02-16 (×8): 5 [IU] via SUBCUTANEOUS
  Administered 2023-02-16: 3 [IU] via SUBCUTANEOUS

## 2023-02-14 MED ORDER — INSULIN GLARGINE-YFGN 100 UNIT/ML ~~LOC~~ SOLN
40.0000 [IU] | Freq: Every day | SUBCUTANEOUS | Status: DC
  Administered 2023-02-14 – 2023-02-15 (×2): 40 [IU] via SUBCUTANEOUS
  Filled 2023-02-14 (×3): qty 0.4

## 2023-02-14 MED ORDER — OXYCODONE HCL 5 MG PO TABS
5.0000 mg | ORAL_TABLET | ORAL | Status: DC | PRN
  Administered 2023-02-14 – 2023-02-16 (×4): 5 mg via ORAL
  Filled 2023-02-14 (×5): qty 1

## 2023-02-14 MED ORDER — FLUCONAZOLE 150 MG PO TABS
150.0000 mg | ORAL_TABLET | Freq: Every day | ORAL | Status: AC
  Administered 2023-02-15 – 2023-02-16 (×2): 150 mg via ORAL
  Filled 2023-02-14 (×3): qty 1

## 2023-02-14 MED ORDER — VANCOMYCIN HCL 2000 MG/400ML IV SOLN
2000.0000 mg | Freq: Two times a day (BID) | INTRAVENOUS | Status: DC
  Filled 2023-02-14 (×3): qty 400

## 2023-02-14 MED ORDER — NYSTATIN 100000 UNIT/GM EX POWD
Freq: Two times a day (BID) | CUTANEOUS | Status: DC
  Filled 2023-02-14: qty 15

## 2023-02-14 NOTE — Assessment & Plan Note (Signed)
-   Counseled extensively on diet nutrition - Registered dietitian consult - Discussed with patient that she is likely to be wheelchair-bound for life if she does not turn her weight around

## 2023-02-14 NOTE — Assessment & Plan Note (Signed)
-   Left lower extremity pain associated with lymphedema, patient suspect cellulitis, possible DVT - Ultrasound DVT in the a.m. - Procalcitonin is undetectable and clinically leg appears more like lymphedema than cellulitis - Not continuing antibiotics at this time - Wound care consult - Pain control - Continue to monitor

## 2023-02-14 NOTE — ED Notes (Signed)
ED TO INPATIENT HANDOFF REPORT  ED Nurse Name and Phone #: Fredric Mare [phone]  S Name/Age/Gender Brittany [name] 42 y.o. female Room/Bed: APA08/APA08  Code Status   Code Status: Prior  Home/SNF/Other Home Patient oriented to: self, place, time, and situation Is this baseline? Yes   Triage Complete: Triage complete  Chief Complaint Leg pain [M79.606]  Triage Note Pt states hx of cellulitis and lymphadenia. Pt states having left lower leg pain that radiates to her left foot. No discoloration noted in triage. Pt states it "leaks fluid, and feels like cellulitis."   Allergies Allergies  Allergen Reactions   Paxil [Paroxetine Hcl]     Suicidal thoughts   Morphine And Codeine Other (See Comments)    "it just freaks me out"   Latex Itching, Swelling and Rash   Sulfa Antibiotics Nausea And Vomiting and Rash    Level of Care/Admitting Diagnosis ED Disposition     ED Disposition  Admit   Condition  --   Comment  Hospital [facility]: [facility] [[id number]]  Level of Care: Med-Surg [16]  Covid Evaluation: Asymptomatic - no recent exposure (last 10 days) testing not required  Diagnosis: Leg pain [629528]  Admitting Physician: Lilyan Gilford [[id number]]  Attending Physician: Lilyan Gilford [[id number]]          B Medical/Surgery History Past Medical History:  Diagnosis Date   Anxiety    Carpal tunnel syndrome on left 10/12/2016   Cellulitis and abscess of leg    Chronic abdominal pain    Depression    Diabetes mellitus without complication (HCC)    Gallstones    Gastroparesis    GERD (gastroesophageal reflux disease) 08/13/2017   Headache    Hypothyroid 01/03/2018   Lymphedema    Sleep apnea    DX with sleep apnea - unable to use c-pap   Umbilical hernia    Past Surgical History:  Procedure Laterality Date   BIOPSY  11/11/2019   Procedure: BIOPSY;  Surgeon: Tressia Danas, MD;  Location: [facility];  Service: Gastroenterology;;   CESAREAN SECTION   11/09/01   ESOPHAGOGASTRODUODENOSCOPY (EGD) WITH PROPOFOL N/A 11/11/2019   Procedure: ESOPHAGOGASTRODUODENOSCOPY (EGD) WITH PROPOFOL;  Surgeon: Tressia Danas, MD;  Location: [facility];  Service: Gastroenterology;  Laterality: N/A;   HERNIA REPAIR     INSERTION OF MESH N/A 01/02/2013   Procedure: INSERTION OF MESH;  Surgeon: Lodema Pilot, DO;  Location: [facility];  Service: General;  Laterality: N/A;   UMBILICAL HERNIA REPAIR N/A 01/02/2013   Procedure: LAPAROSCOPIC UMBILICAL HERNIA;  Surgeon: Lodema Pilot, DO;  Location: [facility];  Service: General;  Laterality: N/A;     A IV Location/Drains/Wounds Patient Lines/Drains/Airways Status     Active Line/Drains/Airways     Name Placement date Placement time Site Days   Peripheral IV 02/13/23 20 G Right Antecubital 02/13/23  2234  Antecubital  1            Intake/Output Last 24 hours No intake or output data in the 24 hours ending 02/14/23 0054  Labs/Imaging Results for orders placed or performed during the hospital encounter of 02/13/23 (from the past 48 hour(s))  Basic metabolic panel     Status: Abnormal   Collection Time: 02/13/23 10:36 PM  Result Value Ref Range   Sodium 135 135 - 145 mmol/L   Potassium 3.8 3.5 - 5.1 mmol/L   Chloride 105 98 - 111 mmol/L   CO2 23 22 - 32 mmol/L   Glucose, Bld 242 (H)  70 - 99 mg/dL    Comment: Glucose reference range applies only to samples taken after fasting for at least 8 hours.   BUN 12 6 - 20 mg/dL   Creatinine, Ser 6.21 0.44 - 1.00 mg/dL   Calcium 8.2 (L) 8.9 - 10.3 mg/dL   GFR, Estimated >30 >86 mL/min    Comment: (NOTE) Calculated using the CKD-EPI Creatinine Equation (2021)    Anion gap 7 5 - 15    Comment: Performed at [facility], [address] [place], [place] [address]  CBC with Differential     Status: None   Collection Time: 02/13/23 10:36 PM  Result Value Ref Range   WBC 5.7 4.0 - 10.5 K/uL   RBC 4.48 3.87 - 5.11 MIL/uL   Hemoglobin 12.4 12.0 - 15.0 g/dL   HCT 96.2  95.2 - 84.1 %   MCV 88.8 80.0 - 100.0 fL   MCH 27.7 26.0 - 34.0 pg   MCHC 31.2 30.0 - 36.0 g/dL   RDW 32.4 40.1 - 02.7 %   Platelets 187 150 - 400 K/uL   nRBC 0.0 0.0 - 0.2 %   Neutrophils Relative % 68 %   Neutro Abs 3.9 1.7 - 7.7 K/uL   Lymphocytes Relative 23 %   Lymphs Abs 1.3 0.7 - 4.0 K/uL   Monocytes Relative 5 %   Monocytes Absolute 0.3 0.1 - 1.0 K/uL   Eosinophils Relative 2 %   Eosinophils Absolute 0.1 0.0 - 0.5 K/uL   Basophils Relative 1 %   Basophils Absolute 0.0 0.0 - 0.1 K/uL   Immature Granulocytes 1 %   Abs Immature Granulocytes 0.03 0.00 - 0.07 K/uL    Comment: Performed at [facility], [address], [place], [place] [address]   No results found.  Pending Labs Unresulted Labs (From admission, onward)     Start     Ordered   02/14/23 0045  Procalcitonin  Add-on,   AD       References:    Procalcitonin Lower Respiratory Tract Infection AND Sepsis Procalcitonin Algorithm   02/14/23 0044            Vitals/Pain Today's Vitals   02/14/23 0000 02/14/23 0015 02/14/23 0030 02/14/23 0048  BP: (!) 141/98     Pulse: 85 83 83   Resp:      Temp:      TempSrc:      SpO2: 98% 90% 98%   Weight:      Height:      PainSc:    5     Isolation Precautions No active isolations  Medications Medications  lactated ringers bolus 1,000 mL (1,000 mLs Intravenous New Bag/Given 02/13/23 2235)  vancomycin (VANCOCIN) IVPB 1000 mg/200 mL premix (0 mg Intravenous Stopped 02/14/23 0048)  HYDROmorphone (DILAUDID) injection 1 mg (1 mg Intravenous Given 02/13/23 2354)  fluconazole (DIFLUCAN) tablet 150 mg (150 mg Oral Given 02/13/23 2356)    Mobility Walks Pt is unable to walk long distances. Sts she is able to take a few steps. Typically uses wheelchair to get around. Has motorized wheelchair she uses when she goes out. Personal wheelchair at bedside was taken home by husband.      Focused Assessments Skin assessment. LLE knee fold, cellulitis with white/yellow drainage.     R Recommendations: See Admitting Provider Note  Report given to:   Additional Notes:  IV Vancomycin and PO diflucan given.

## 2023-02-14 NOTE — Progress Notes (Signed)
Patient seen and examined; admitted after midnight secondary to left lower extremity pain.  There is no erythematous changes and per patient description main complaint is burning/lightening sensation going down her leg.  Patient is hemodynamically stable; physical examination demonstrating significant lymphedema with superimposed yeast dermatitis.  Please refer to H&P written by Dr.Zierle-ghosh on 02/14/2023 for further info/details on admission.  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 -Appreciate wound care service assistance and recommendations; continue treatment with Diflucan, nystatin and outpatient referral to lymphedema clinic. -Continue to follow clinical response. -Hopefully discharge home in the next 24-48 hours.  Vassie Loll MD [phone]

## 2023-02-14 NOTE — Progress Notes (Addendum)
Nutrition Brief Note  RD consulted for assessment of nutritional requirements/ status.   Wt Readings from Last 15 Encounters:  02/13/23 (!) 260.8 kg  01/16/21 (!) 250.8 kg  01/01/21 (!) 250.8 kg  11/11/19 (!) 240.4 kg  08/17/19 (!) 242.5 kg  04/12/18 (!) 236.6 kg  03/16/18 (!) 244.9 kg  02/04/18 (!) 244.9 kg  01/09/18 (!) 244.7 kg  01/03/18 (!) 244.9 kg  12/11/17 (!) 247.2 kg  11/12/17 (!) 237.6 kg  08/22/17 (!) 239.3 kg  08/11/17 (!) 242.2 kg  06/30/17 (!) 265.4 kg   Pt with medical history significant of anxiety, lymphedema, diabetic, gastroparesis, GERD, and more presents with a chief complaint of left lower extremity pain.   Pt admitted with lt lower extremity pain associated with lymphedema (possible cellulitis vs DVT).   Spoke with pt at bedside, who reports not feeling well today. She shares that she has been struggling with her health over the past 2 years secondary to gastroparesis. Per pt, she has had multiple procedures done and tried numerous medications with minimal relief. Pt shares that she takes dissolvable zofran which "works to a point". Pt shares that she has a lot of anxiety surrounding eating, as she has a lot of fear about what will happen when she eats. Pt shares that she will sometimes be able to keep food down and other times she vomits it "right back up". Through trial and error, pt shares that she consumes a lot of "safe" foods such as popcorn and avoids foods such as onions. She tries to eat every few hours and estimates that she consumes 1 large meal per day that her daughter prepares for her, which she will consume 50-100% of, depending on how she is feeling. She also reports diet carbonated beverages help with symptoms.   Pt shares that she has lost and gained weight off and on and expresses frustration over this. Pt shares when her gastroparesis was poorly managed, she lost about 50# in the past month, but weight has been stable. Pt reports that she is able  to ambulate, but not very far secondary to pain from lymphedema. She is also concerned of pushing herself too much and developing rashes and cellulitis.   RD discussed dangers to large amounts of weight loss over short periods of time and concern of losing lean body mass and muscle mass that may further impair her ability and quality of life. RD discussed diet principles of gastroparesis, including consuming small, frequent meals to help stimulate gastric emptying and including lower fiber, low fat foods in diet. Also discussed ways to help improve glycemic control.   Pt very forthcoming with this RD about how her illnesses and social stressors have impacted her quality of life. In addition to stress over her own health issues, she cares for a family member with dementia. She reports that her anxiety has gotten much worse and is likely impacting her illness. She plans to discuss better managing her anxiety with her PCP at her upcoming appointment.   Pt with no further questions, but appreciative of visit. RD also provided referral to [facility] for further support.   Lab Results  Component Value Date   HGBA1C 8.1 (H) 01/16/2021   PTA DM medications are SSI insulin aspart TID with meals. 23 units insulin glargine-yfgn daily, 90 units insulin glargine-yfgn daily.   Labs reviewed: CBGS: 211-236 (inpatient orders for glycemic control are 0-15 units insulin aspart TID with meals, 0-5 units insulin aspart daily at bedtime, and 40  units insulin glargine-yfgn daily at bedtime).    Body mass index is 77.98 kg/m. Patient meets criteria for obesity, class III based on current BMI. Obesity is a complex, chronic medical condition that is optimally managed by a multidisciplinary care team. Weight loss is not an ideal goal for an acute inpatient hospitalization. However, if further work-up for obesity is warranted, consider outpatient referral to [facility].    Current diet  order is heart healthy, carb modified (liberalized to carb modified), patient is consuming approximately n/a% of meals at this time. Labs and medications reviewed.   No nutrition interventions warranted at this time. If nutrition issues arise, please consult RD.   Levada Schilling, RD, LDN, CDCES Registered Dietitian II Certified Diabetes Care and Education Specialist Please refer to [facility] for RD and/or RD on-call/weekend/after hours pager

## 2023-02-14 NOTE — Assessment & Plan Note (Signed)
-   With hyperglycemia of 242 - 65 units of long-acting insulin at baseline - Continue reduced dose of 40 units of basal insulin - Sliding scale coverage - Carb modified diet - Nutrition consult - Hemoglobin A1c - Continue to monitor

## 2023-02-14 NOTE — ED Provider Notes (Signed)
  Provider Note MRN:  [id number]  Arrival date & time: 02/14/23    ED Course and Medical Decision Making  Assumed care from Dr. Rhunette Croft at shift change.  Cellulitis anticipating need for IV antibiotics/admission.  Procedures  Final Clinical Impressions(s) / ED Diagnoses     ICD-10-CM   1. Cellulitis of right lower extremity  L03.115       ED Discharge Orders     None       Discharge Instructions   None     Elmer Sow. Pilar Plate, MD [facility] [email or web address]    Sabas Sous, MD 02/14/23 [phone]

## 2023-02-14 NOTE — Progress Notes (Signed)
   02/14/23 1434  TOC Brief Assessment  Insurance and Status Reviewed  Patient has primary care physician Yes  Home environment has been reviewed Home with spouse  Prior level of function: needs assistance  Prior/Current Home Services No current home services  Social Determinants of Health Reivew SDOH reviewed no interventions necessary  Readmission risk has been reviewed Yes  Transition of care needs no transition of care needs at this time   Admitted for leg pain. Needing ultra sound to assess. TOC following.

## 2023-02-14 NOTE — H&P (Signed)
History and Physical    Patient: Brittany [name]:[id number] DOB: Oct 19, 1980 DOA: 02/13/2023 DOS: the patient was seen and examined on 02/14/2023 PCP: Lianne Moris, PA-C  Patient coming from: Home  Chief Complaint:  Chief Complaint  Patient presents with   Leg Pain   HPI: Brittany [name] is a 42 y.o. female with medical history significant of anxiety, lymphedema, diabetic, gastroparesis, GERD, and more presents the ED with a chief complaint of left lower extremity pain.  Patient reports that it hurts from her knee to her ankle.  She reports that the leg is erythematous and painful to palpation.  She reports that this has happened before, where she gets crusting and drainage behind her left knee.  She usually uses Silvadene cream and it clears up.  This time it has not cleared up with Silvadene cream.  Patient reports that they have been cleaning it daily, applying the cream and then cleaning in the night.  Patient reports this is the third day of symptoms.  She has not had a fever.  Patient thinks it started in her mid shin and radiated in both directions.  She describes the pain as feeling like she has a broken leg.  It is not necessarily worse with standing.  Nothing is made the pain better.  She tried ice on the leg and it did not help.  Patient reports she has had nausea and vomiting but they are not associated as she has gastroparesis and this is chronic for her.  Her last meal was more than 24 hours ago per patient.  Her last vomiting was around her last meal.  Patient denies any dysuria or diarrhea.  She does report an occasional cough.  She has no other complaints at this time.  Patient does not smoke and does not drink.  Patient is full code. Review of Systems: As mentioned in the history of present illness. All other systems reviewed and are negative. Past Medical History:  Diagnosis Date   Anxiety    Carpal tunnel syndrome on left 10/12/2016   Cellulitis and abscess of leg     Chronic abdominal pain    Depression    Diabetes mellitus without complication (HCC)    Gallstones    Gastroparesis    GERD (gastroesophageal reflux disease) 08/13/2017   Headache    Hypothyroid 01/03/2018   Lymphedema    Sleep apnea    DX with sleep apnea - unable to use c-pap   Umbilical hernia    Past Surgical History:  Procedure Laterality Date   BIOPSY  11/11/2019   Procedure: BIOPSY;  Surgeon: Tressia Danas, MD;  Location: [facility];  Service: Gastroenterology;;   CESAREAN SECTION  11/09/01   ESOPHAGOGASTRODUODENOSCOPY (EGD) WITH PROPOFOL N/A 11/11/2019   Procedure: ESOPHAGOGASTRODUODENOSCOPY (EGD) WITH PROPOFOL;  Surgeon: Tressia Danas, MD;  Location: [facility];  Service: Gastroenterology;  Laterality: N/A;   HERNIA REPAIR     INSERTION OF MESH N/A 01/02/2013   Procedure: INSERTION OF MESH;  Surgeon: Lodema Pilot, DO;  Location: [facility];  Service: General;  Laterality: N/A;   UMBILICAL HERNIA REPAIR N/A 01/02/2013   Procedure: LAPAROSCOPIC UMBILICAL HERNIA;  Surgeon: Lodema Pilot, DO;  Location: [facility];  Service: General;  Laterality: N/A;   Social History:  reports that she has never smoked. She has never used smokeless tobacco. She reports that she does not drink alcohol and does not use drugs.  Allergies  Allergen Reactions   Paxil [Paroxetine Hcl]  Suicidal thoughts   Morphine And Codeine Other (See Comments)    "it just freaks me out"   Latex Itching, Swelling and Rash   Sulfa Antibiotics Nausea And Vomiting and Rash    Family History  Problem Relation Age of Onset   Cancer Mother        leukemia   Diabetes Mother    Hypertension Mother    Hyperlipidemia Mother    Other Father        Sciatica, kidney stones   Alcohol abuse Paternal Grandfather    Other Paternal Grandmother        bowel obstruction; pneumonia   Hypertension Maternal Grandmother    Diabetes Maternal Grandmother    Cancer Maternal Grandfather        lung   ADD / ADHD Daughter     Depression Daughter    Anxiety disorder Daughter     Prior to Admission medications   Medication Sig Start Date End Date Taking? Authorizing Provider  ibuprofen (ADVIL) 800 MG tablet Take 1 tablet (800 mg total) by mouth every 8 (eight) hours as needed. 11/01/21   Leath-Warren, Sadie Haber, NP  insulin aspart (NOVOLOG) 100 UNIT/ML injection SLIDING SCALE 3 TIMES DAILY WITH MEALS: For blood sugar 70-120-no insulin. Blood sugar 121-150 take 5 units. Blood sugar 151-208 take 8 units. Blood sugar 201-250 take 12 units. Blood sugar 251-300 take 17 units. Blood sugar 301-350 take 20 units. Blood sugar 351-400 take 25 units. Blood sugar greater than 400 take 28 units and call your doctor. 03/27/16   Devoria Albe, MD  insulin glargine (LANTUS) 100 UNIT/ML injection Inject 0.65 mLs (65 Units total) into the skin at bedtime. 08/13/17   Dhungel, Theda [place], MD  metoprolol succinate (TOPROL-XL) 50 MG 24 hr tablet Take 1 tablet (50 mg total) by mouth daily. Take with or immediately following a meal. 01/21/21 02/20/21  Sherryll Burger, Pratik D, DO  ondansetron (ZOFRAN-ODT) 8 MG disintegrating tablet Take 8 mg by mouth every 8 (eight) hours as needed for nausea or vomiting.    [provider]  SSD 1 % cream Apply 1 application topically daily as needed (rash/burns).  09/19/19   [provider]  VICTOZA 18 MG/3ML SOPN Inject 1.8 mg into the skin at bedtime. 07/11/19   [provider]    Physical Exam: Vitals:   02/14/23 0100 02/14/23 0102 02/14/23 0206 02/14/23 0445  BP:  138/86 (!) 144/74 135/71  Pulse: 78 80 86 86  Resp:  (!) 21 20 18   Temp:  98.1 F (36.7 C) 98.5 F (36.9 C) 98.7 F (37.1 C)  TempSrc:  Oral Oral Oral  SpO2: 97% 100% 97% 95%  Weight:      Height:       1.  General: Patient lying supine in bed,  no acute distress   2. Psychiatric: Alert and oriented x 3, mood and behavior normal for situation, pleasant and cooperative with exam   3. Neurologic: Speech and language are  normal, face is symmetric, moves all 4 extremities voluntarily, at baseline without acute deficits on limited exam   4. HEENMT:  Head is atraumatic, normocephalic, pupils reactive to light, neck is supple, trachea is midline, mucous membranes are moist   5. Respiratory : Lungs are clear to auscultation bilaterally without wheezing, rhonchi, rales, no cyanosis, no increase in work of breathing or accessory muscle use   6. Cardiovascular : Heart rate normal, rhythm is regular, no murmurs, rubs or gallops, no peripheral edema, peripheral pulses palpated  7. Gastrointestinal:  Abdomen is soft, nondistended, nontender to palpation bowel sounds active, no masses or organomegaly palpated   8. Skin:  Yeast dermatitis in popliteal fossa of left lower extremity   9.Musculoskeletal:  No acute deformities or trauma, no asymmetry in tone, no peripheral edema, peripheral pulses palpated, no tenderness to palpation in the extremities  Data Reviewed: In the ED Temp 97.6-98.9, heart rate 79-91, respiratory rate 20-22, blood pressure 129/66-177/107, satting 94-97% No leukocytosis with a white blood cell count of 5.7, hemoglobin 12.4 Chemistry unremarkable, glucose 242 Vancomycin, Diflucan started in the ED Patient was given Dilaudid for pain Patient was given 1 L bolus Admission requested for possible cellulitis versus DVT Patient is likely to be discharged today Assessment and Plan: * Leg pain - Left lower extremity pain associated with lymphedema, patient suspect cellulitis, possible DVT - Ultrasound DVT in the a.m. - Procalcitonin is undetectable and clinically leg appears more like lymphedema than cellulitis - Not continuing antibiotics at this time - Wound care consult - Pain control - Continue to monitor  Morbid obesity with body mass index (BMI) greater than or equal to 70 in adult (HCC) - Counseled extensively on diet nutrition - Registered dietitian consult - Discussed with patient  that she is likely to be wheelchair-bound for life if she does not turn her weight around  Diabetes mellitus without complication (HCC) - With hyperglycemia of 242 - 65 units of long-acting insulin at baseline - Continue reduced dose of 40 units of basal insulin - Sliding scale coverage - Carb modified diet - Nutrition consult - Hemoglobin A1c - Continue to monitor  Yeast dermatitis - In her popliteal fossa of the left lower extremity - Patient been treating with SSD cream - Hold creams and start nystatin powder - Diflucan was given in the ED - Continue to monitor      Advance Care Planning:   Code Status: Full Code  Consults: None at this time  Family Communication: No family at bedside  Severity of Illness: Patient is likely to be discharged today.  The appropriate patient status for this patient is OBSERVATION. Observation status is judged to be reasonable and necessary in order to provide the required intensity of service to ensure the patient's safety. The patient's presenting symptoms, physical exam findings, and initial radiographic and laboratory data in the context of their medical condition is felt to place them at decreased risk for further clinical deterioration. Furthermore, it is anticipated that the patient will be medically stable for discharge from the hospital within 2 midnights of admission.   Author: Lilyan Gilford, DO 02/14/2023 5:07 AM  For on call review [email or web address].

## 2023-02-14 NOTE — Progress Notes (Signed)
Mobility Specialist Progress Note:    02/14/23 1240  Mobility  Activity Ambulated with assistance in room  Level of Assistance Standby assist, set-up cues, supervision of patient - no hands on  Assistive Device None  Distance Ambulated (ft) 10 ft  Range of Motion/Exercises Active;All extremities  Activity Response Tolerated well  Mobility Referral Yes  $Mobility charge 1 Mobility  Mobility Specialist Start Time (ACUTE ONLY) 1240  Mobility Specialist Stop Time (ACUTE ONLY) 1245  Mobility Specialist Time Calculation (min) (ACUTE ONLY) 5 min   Pt was received in bed, agreeable to ambulate short distance. Required SBA with no AD to stand and ambulate room. Tolerated well, asx throughout. Returned pt to bed, pt preferred dangling EOB with LLE elevated. Left pt with all needs met.   Lawerance Bach Mobility Specialist Please contact via Special educational needs teacher or  Rehab office at [phone]

## 2023-02-14 NOTE — ED Notes (Signed)
Pts husband taking pts personal wheelchair home as pt plan is for admission.

## 2023-02-14 NOTE — Consult Note (Signed)
WOC Nurse Consult Note: Consult is performed remotely following review of medical record. Reason for Consult:Weeping dermatitis to left LE posterior fossa crease/skin fold. LLE cellulitis with history of lymphedema. Erythema intertrigo/irritant contact dermatitis Wound type:ICD, infectious  ICD-10 CM Codes for Irritant Dermatitis L24A9 - Due to friction or contact with other specified body fluids L30.4  - Erythema intertrigo. Also used for abrasion of the hand, chafing of the skin, dermatitis due to sweating and friction, friction dermatitis, friction eczema, and genital/thigh intertrigo.   Pressure Injury POA: N/A Measurement:N/A Wound bed:N/A Drainage (amount, consistency, odor) serous fluid weeping in a moderate to large amount Periwound:maceration, erythema, edema Dressing procedure/placement/frequency:I have provided guidance for nursing in the care of this area using a daily cleanse with NS followed by placement of a silver hydrofiber strip (Aquacel Ag+ Advantage, [employer] [place] #[id number]) topped/secured with house antimicrobial moisture wicking textile [place] # [id number]). Arneta Cliche is also to be used after skin cleansing and gentle drying to all other skin folds. A bariatric mattress replacement is ordered as are bilateral pressure redistribution heel boots, Prevalon boots.  Patient may benefit from referral to an outpatient lymphedema clinic post discharge. If you agree, some local options for care are below.  Lymphedema  Resources (updated 05/2021) Each site requires a referral from your primary care MD  [facility] [address] [place], [place]  [phone] (Upper extremities)  [address] [place], [place] [phone] (Lower extremities, PATIENT CAN NOT HAVE A WOUND)  [facility] [address] [place], [place] [address] [phone]  [facility] [address]  [place], [place] [phone]  [facility] [address] [place], [place] [address] [phone]  [facility]  (only treatment for lymphedema related to cancer diagnosis) [address]  [place], [place] [address] [phone]    [facility] [address] [place], [place] [address] [phone] [facility] ([facility]) [phone] [address] [place], [place] [address] [phone]     WOC nursing team will not follow, but will remain available to this patient, the nursing and medical teams.  Please re-consult if needed.  Thank you for inviting [place] to participate in this patient's Plan of Care.  Ladona Mow, MSN, RN, CNS, GNP, Leda Min, [employer], [employer] phone:  [phone]

## 2023-02-14 NOTE — Assessment & Plan Note (Signed)
-   In her popliteal fossa of the left lower extremity - Patient been treating with SSD cream - Hold creams and start nystatin powder - Diflucan was given in the ED - Continue to monitor

## 2023-02-14 NOTE — ED Notes (Signed)
Report given to Floor RN room 329. Pt awaiting transport to floor as RN will order bariatric bed for pt comfort.

## 2023-02-14 NOTE — Discharge Instructions (Signed)
Plate Method for Diabetes   Foods with carbohydrates make your blood glucose level go up. The plate method is a simple way to meal plan and control the amount of carbohydrate you eat.         Use the following guidance to build a healthy plate to control carbohydrates. Divide a 9-inch plate into 3 sections, and consider your beverage the 4th section of your meal: Food Group Examples of Foods/Beverages for This Section of your Meal  Section 1: Non-starchy vegetables Fill  of your plate to include non-starchy vegetables Asparagus, broccoli, brussels sprouts, cabbage, carrots, cauliflower, celery, cucumber, green beans, mushrooms, peppers, salad greens, tomatoes, or zucchini.  Section 2: Protein foods Fill  of your plate to include a lean protein Lean meat, poultry, fish, seafood, cheese, eggs, lean deli meat, tofu, beans, lentils, nuts or nut butters.  Section 3: Carbohydrate foods Fill  of your plate to include carbohydrate foods Whole grains, whole wheat bread, brown rice, whole grain pasta, polenta, corn tortillas, fruit, or starchy vegetables (potatoes, green peas, corn, beans, acorn squash, and butternut squash). One cup of milk also counts as a food that contains carbohydrate.  Section 4: Beverage Choose water or a low-calorie drink for your beverage. Unsweetened tea, coffee, or flavored/sparkling water without added sugar.  Image reprinted with permission from The American [employer] Association.  Copyright 2022 by the American Diabetes Association.   Copyright 2022  [employer]. All rights reserved    Carbohydrate Counting For People With Diabetes  Foods with carbohydrates make your blood glucose level go up. Learning how to count carbohydrates can help you control your blood glucose levels. First, identify the foods you eat that contain carbohydrates. Then, using the Foods with Carbohydrates chart, determine about how much carbohydrates are in your meals and  snacks. Make sure you are eating foods with fiber, protein, and healthy fat along with your carbohydrate foods. Foods with Carbohydrates The following table shows carbohydrate foods that have about 15 grams of carbohydrate each. Using measuring cups, spoons, or a food scale when you first begin learning about carbohydrate counting can help you learn about the portion sizes you typically eat. The following foods have 15 grams carbohydrate each:  Grains 1 slice bread (1 ounce)  1 small tortilla (6-inch size)   large bagel (1 ounce)  1/3 cup pasta or rice (cooked)   hamburger or hot dog bun ( ounce)   cup cooked cereal   to  cup ready-to-eat cereal  2 taco shells (5-inch size) Fruit 1 small fresh fruit ( to 1 cup)   medium banana  17 small grapes (3 ounces)  1 cup melon or berries   cup canned or frozen fruit  2 tablespoons dried fruit (blueberries, cherries, cranberries, raisins)   cup unsweetened fruit juice  Starchy Vegetables  cup cooked beans, peas, corn, potatoes/sweet potatoes   large baked potato (3 ounces)  1 cup acorn or butternut squash  Snack Foods 3 to 6 crackers  8 potato chips or 13 tortilla chips ( ounce to 1 ounce)  3 cups popped popcorn  Dairy 3/4 cup (6 ounces) nonfat plain yogurt, or yogurt with sugar-free sweetener  1 cup milk  1 cup plain rice, soy, coconut or flavored almond milk Sweets and Desserts  cup ice cream or frozen yogurt  1 tablespoon jam, jelly, pancake syrup, table sugar, or honey  2 tablespoons light pancake syrup  1 inch square of frosted cake or 2 inch square of  unfrosted cake  2 small cookies (2/3 ounce each) or  large cookie  Sometimes you'll have to estimate carbohydrate amounts if you don't know the exact recipe. One cup of mixed foods like soups can have 1 to 2 carbohydrate servings, while some casseroles might have 2 or more servings of carbohydrate. Foods that have less than 20 calories in each serving can be counted as  "free" foods. Count 1 cup raw vegetables, or  cup cooked non-starchy vegetables as "free" foods. If you eat 3 or more servings at one meal, then count them as 1 carbohydrate serving.  Foods without Carbohydrates  Not all foods contain carbohydrates. Meat, some dairy, fats, non-starchy vegetables, and many beverages don't contain carbohydrate. So when you count carbohydrates, you can generally exclude chicken, pork, beef, fish, seafood, eggs, tofu, cheese, butter, sour cream, avocado, nuts, seeds, olives, mayonnaise, water, black coffee, unsweetened tea, and zero-calorie drinks. Vegetables with no or low carbohydrate include green beans, cauliflower, tomatoes, and onions. How much carbohydrate should I eat at each meal?  Carbohydrate counting can help you plan your meals and manage your weight. Following are some starting points for carbohydrate intake at each meal. Work with your registered dietitian nutritionist to find the best range that works for your blood glucose and weight.   To Lose Weight To Maintain Weight  Women 2 - 3 carb servings 3 - 4 carb servings  Men 3 - 4 carb servings 4 - 5 carb servings  Checking your blood glucose after meals will help you know if you need to adjust the timing, type, or number of carbohydrate servings in your meal plan. Achieve and keep a healthy body weight by balancing your food intake and physical activity.  Tips How should I plan my meals?  Plan for half the food on your plate to include non-starchy vegetables, like salad greens, broccoli, or carrots. Try to eat 3 to 5 servings of non-starchy vegetables every day. Have a protein food at each meal. Protein foods include chicken, fish, meat, eggs, or beans (note that beans contain carbohydrate). These two food groups (non-starchy vegetables and proteins) are low in carbohydrate. If you fill up your plate with these foods, you will eat less carbohydrate but still fill up your stomach. Try to limit your carbohydrate  portion to  of the plate.  What fats are healthiest to eat?  Diabetes increases risk for heart disease. To help protect your heart, eat more healthy fats, such as olive oil, nuts, and avocado. Eat less saturated fats like butter, cream, and high-fat meats, like bacon and sausage. Avoid trans fats, which are in all foods that list "partially hydrogenated oil" as an ingredient. What should I drink?  Choose drinks that are not sweetened with sugar. The healthiest choices are water, carbonated or seltzer waters, and tea and coffee without added sugars.  Sweet drinks will make your blood glucose go up very quickly. One serving of soda or energy drink is  cup. It is best to drink these beverages only if your blood glucose is low.  Artificially sweetened, or diet drinks, typically do not increase your blood glucose if they have zero calories in them. Read labels of beverages, as some diet drinks do have carbohydrate and will raise your blood glucose. Label Reading Tips Read Nutrition Facts labels to find out how many grams of carbohydrate are in a food you want to eat. Don't forget: sometimes serving sizes on the label aren't the same as how much food  you are going to eat, so you may need to calculate how much carbohydrate is in the food you are serving yourself.   Carbohydrate Counting for People with Diabetes Sample 1-Day Menu  Breakfast  cup yogurt, low fat, low sugar (1 carbohydrate serving)   cup cereal, ready-to-eat, unsweetened (1 carbohydrate serving)  1 cup strawberries (1 carbohydrate serving)   cup almonds ( carbohydrate serving)  Lunch 1, 5 ounce can chunk light tuna  2 ounces cheese, low fat cheddar  6 whole wheat crackers (1 carbohydrate serving)  1 small apple (1 carbohydrate servings)   cup carrots ( carbohydrate serving)   cup snap peas  1 cup 1% milk (1 carbohydrate serving)   Evening Meal Stir fry made with: 3 ounces chicken  1 cup brown rice (3 carbohydrate servings)    cup broccoli ( carbohydrate serving)   cup green beans   cup onions  1 tablespoon olive oil  2 tablespoons teriyaki sauce ( carbohydrate serving)  Evening Snack 1 extra small banana (1 carbohydrate serving)  1 tablespoon peanut butter   Carbohydrate Counting for People with Diabetes Vegan Sample 1-Day Menu  Breakfast 1 cup cooked oatmeal (2 carbohydrate servings)   cup blueberries (1 carbohydrate serving)  2 tablespoons flaxseeds  1 cup soymilk fortified with calcium and vitamin D  1 cup coffee  Lunch 2 slices whole wheat bread (2 carbohydrate servings)   cup baked tofu   cup lettuce  2 slices tomato  2 slices avocado   cup baby carrots ( carbohydrate serving)  1 orange (1 carbohydrate serving)  1 cup soymilk fortified with calcium and vitamin D   Evening Meal Burrito made with: 1 6-inch corn tortilla (1 carbohydrate serving)  1 cup refried vegetarian beans (2 carbohydrate servings)   cup chopped tomatoes   cup lettuce   cup salsa  1/3 cup brown rice (1 carbohydrate serving)  1 tablespoon olive oil for rice   cup zucchini   Evening Snack 6 small whole grain crackers (1 carbohydrate serving)  2 apricots ( carbohydrate serving)   cup unsalted peanuts ( carbohydrate serving)    Carbohydrate Counting for People with Diabetes Vegetarian (Lacto-Ovo) Sample 1-Day Menu  Breakfast 1 cup cooked oatmeal (2 carbohydrate servings)   cup blueberries (1 carbohydrate serving)  2 tablespoons flaxseeds  1 egg  1 cup 1% milk (1 carbohydrate serving)  1 cup coffee  Lunch 2 slices whole wheat bread (2 carbohydrate servings)  2 ounces low-fat cheese   cup lettuce  2 slices tomato  2 slices avocado   cup baby carrots ( carbohydrate serving)  1 orange (1 carbohydrate serving)  1 cup unsweetened tea  Evening Meal Burrito made with: 1 6-inch corn tortilla (1 carbohydrate serving)   cup refried vegetarian beans (1 carbohydrate serving)   cup tomatoes   cup lettuce    cup salsa  1/3 cup brown rice (1 carbohydrate serving)  1 tablespoon olive oil for rice   cup zucchini  1 cup 1% milk (1 carbohydrate serving)  Evening Snack 6 small whole grain crackers (1 carbohydrate serving)  2 apricots ( carbohydrate serving)   cup unsalted peanuts ( carbohydrate serving)    Copyright 2020  Academy of [employer] and Dietetics. All rights reserved.  Using Nutrition Labels: Carbohydrate  Serving Size  Look at the serving size. All the information on the label is based on this portion. Servings Per Container  The number of servings contained in the package. Guidelines for Carbohydrate  Look at  the total grams of carbohydrate in the serving size.  1 carbohydrate choice = 15 grams of carbohydrate. Range of Carbohydrate Grams Per Choice  Carbohydrate Grams/Choice Carbohydrate Choices  6-10   11-20 1  21-25 1  26-35 2  36-40 2  41-50 3  51-55 3  56-65 4  66-70 4  71-80 5    Copyright 2020  Academy of [employer] and [employer]etics. All rights reserved.   Gastroparesis Nutrition Therapy  Gastroparesis means that your stomach empties very slowly. This happens when the nerves to your stomach are damaged or do not work properly. This can cause bloating, stomach discomfort or pain, feeling full after eating only a small amount of food, nausea, or vomiting. If you have diabetes in addition to gastroparesis, it is important to control your blood glucose. This will help the stomach empty.  Tips Following these tips may help your stomach empty faster:  Eat small, frequent meals (4 to 6 times per day).  Do not eat solid foods that are high in fat and do not add too much fat to foods. See the Foods Not Recommended table for foods that are high fat.  High-fat solid foods may delay the emptying of your stomach.  Liquids that contain fat, such as milkshakes, may be tolerated and can provide needed calories. Do not eat foods high in fiber. Do not take fiber  supplements or fiber bulking agents for constipation.  Do not eat foods that increase acid reflux:  Acidic, spicy, fried and greasy foods  Caffeine  Mint Do not drink alcohol or smoke  Do not drink carbonated beverages, as they increase bloating.  Chew foods well before swallowing. Solid foods in the stomach do not empty well. If you have difficulty tolerating solid foods, ground foods may be better.  If symptoms are severe, semi-solid foods or liquids may need to be your main food sources. Choose liquid nutritional supplements that have less than or equal to 2 grams fiber per serving.  Sit upright while eating and sit upright or walk after meals. Do not lie down for 3 to 4 hours after eating to avoid reflux or regurgitation.  If you wish to nap during the day, nap first and then eat.  Drinking fluids at meals can take up room in your stomach, and you might not get enough calories. At every meal, first eat a grain food and a protein food or dairy product if your body can tolerate it. Drink fluids with calories. It may be better to delay fluids until after the meal and drink more between meals.   Foods Recommended Food Group Foods Recommended   Grains Choose grain foods with less than 2 grams of fiber per serving; these will be made with white flour Crackers: saltines or graham crackers Cold cereal: puffed rice Cream of rice or wheat Grits (fine ground) Gluten free low fiber foods Pretzels White bread, toasted White rice, cook until very soft  Protein Foods Lean meat and poultry: well-cooked, very tender, moist, and chopped fine Fish: tuna, salmon, or white fish Egg whites, scrambled Peanut butter (limit to 1 tablespoon at a time)  Dairy Milk*, drink 2% if tolerated to get more nutrients or lactose-free 2% milk Fortified non-dairy milks: almond, cashew, coconut, or rice (be aware that these options are not good sources of protein so you will need to eat an additional protein  food) Fortified pea milk or soymilk (may cause gas and bloating for some) Instant breakfast* (pre-made lactose-free is sold  in bottles) Milkshakes* (try blending in  to  cup canned fruit) Ice cream* (low-fat may be tolerated better; use in milkshakes to increase calories) Frozen yogurt Yogurt* Puddings and custard* Sherbet Liquid nutritional supplements with less than or equal to 2 grams fiber per 1 cup serving *Use lactose-free varieties to reduce gas and bloating  Vegetables Canned and well-cooked vegetables without seeds, skins or hulls Carrots, cooked Mashed potatoes (white, red or yellow) Sweet potato  Fruit Canned, soft and well-cooked fruits without seeds, skins or membranes Applesauce Banana, mashed may be tolerated better Diced peaches/pears fruit cups in juice Melon, very soft, cut into small pieces Fruit nectar juices  Oils When possible choose oils rather than solid fats Canola or olive oil Margarine  Other Clear soup Gelatin Popsicles   Foods Not Recommended Food Group Foods Not Recommended   Grains Bran Grains foods with 2 or more grams of fiber per serving: barley, brown rice, kasha, quinoa Popcorn Whole grain and high-fiber cereals, including oats or granola Whole grain bread or pasta  Protein Foods Fried meats, poultry or fish Sausage, bacon or hot dogs Seafood Tough meat, meat with gristle: steak, roast beef or pork chops Beans, peas or lentils Nuts  Dairy Cheese slices Liquid nutritional supplements that have more than 2 grams fiber per serving Pea milk, soymilk (may increase gas and bloating)  Vegetables Raw or undercooked vegetables Alfalfa, asparagus, bean sprouts, broccoli, brussels sprouts, cabbage, cauliflower, corn, green peas or any other kind of peas, lima beans, mushrooms, okra, onions, parsnips, peppers, pickles, potato skins, or spinach  Fruit Fresh fruit except for the ones in the foods recommended table Acidic fruit and juices:  oranges/orange juice, grapefruit/grapefruit juice, tomatoes/tomato juice Avocado Berries Coconut Dried fruit Fruit skin Mandarin oranges Pineapple  Oils Fried foods of any type  Other Coffee Olives or pickles Pizza Salsa Sushi   Gastroparesis Sample 1-Day Menu  Breakfast 1 slice white toast (1 carbohydrate serving)  1 teaspoon margarine, soft, tub   cup egg substitute  1 cup peach nectar (2 carbohydrate servings)  Morning Snack Smoothie made with:  small banana (1 carbohydrate serving)  1/3 cup [place] strawberry yogurt ( carbohydrate serving)  1 cup 2% milk (1 carbohydrate serving)  Lunch 2 ounces canned chicken  1 teaspoon mayonnaise  9 saltine crackers (1 carbohydrate servings)   cup applesauce (1 carbohydrate serving)  Afternoon Snack 1 slice white toast (1 carbohydrate serving)  1 tablespoon smooth peanut butter  Evening Meal 2 ounces baked fish   cup mashed potatoes (1 carbohydrate serving)  1 teaspoon olive oil  1 cup 2% milk (1 carbohydrate serving)  Evening Snack 1 packet instant breakfast (1 carbohydrate servings)  1 cup 2% milk (1 carbohydrate serving)   Gastroparesis Vegetarian (Lacto-Ovo) Sample 1-Day Menu  Breakfast  cup cooked farina (1 carbohydrate serving)   cup egg substitute  2 teaspoons olive oil   cup peach nectar (2 carbohydrate servings)   cup 2% milk ( carbohydrate serving)  Morning Snack 1 slice white toast (1 carbohydrate serving)  1 tablespoon smooth peanut butter  Lunch  cup vegetable soup (1 carbohydrate serving)  9 saltine crackers (1 carbohydrate serving)   cup applesauce (1 carbohydrate serving)   cup 2% milk ( carbohydrate serving)  Afternoon Snack 6 ounces plain yogurt (1 carbohydrate serving)   small banana (1 carbohydrate servings)  Evening Meal  cup baked tofu  2/3 cup white rice (2 carbohydrate servings)  2 teaspoons olive oil   cup 2% milk (  carbohydrate serving)  Evening Snack 1 packet instant breakfast  (1 carbohydrate servings)  1 cup 2% milk (1 carbohydrate serving)   Gastroparesis Vegan Sample 1-Day Menu  Breakfast  cup cooked farina (1 carbohydrate serving)  1/3 cup tofu scramble  2 teaspoons olive oil   cup peach nectar (2 carbohydrate servings)   cup almond milk fortified with calcium, vitamin B12, and vitamin D  Morning Snack 1 slice white toast (1 carbohydrate serving)  1 tablespoon smooth peanut butter  Lunch  cup vegetable soup (1 carbohydrate serving)  9 saltine crackers (1 carbohydrate serving)   cup applesauce (1 carbohydrate serving)  Afternoon Snack 6 ounces plain soy yogurt (1 carbohydrate servings)   small banana (1 carbohydrate serving)  Evening Meal  cup baked tofu  2/3 cup white rice (2 carbohydrate servings)  2 teaspoons olive oil   cup almond milk fortified with calcium, vitamin B12, and vitamin D  Evening Snack  scoop soy protein powder ( carbohydrate serving)   cup almond milk fortified with calcium, vitamin B12, and vitamin D  Copyright 2020  [employer]y [employer]. All rights reserved.

## 2023-02-15 ENCOUNTER — Observation Stay (HOSPITAL_COMMUNITY): Payer: Medicaid Other

## 2023-02-15 DIAGNOSIS — M79605 Pain in left leg: Secondary | ICD-10-CM | POA: Diagnosis not present

## 2023-02-15 DIAGNOSIS — Z6841 Body Mass Index (BMI) 40.0 and over, adult: Secondary | ICD-10-CM | POA: Diagnosis not present

## 2023-02-15 DIAGNOSIS — B372 Candidiasis of skin and nail: Secondary | ICD-10-CM | POA: Diagnosis not present

## 2023-02-15 DIAGNOSIS — E119 Type 2 diabetes mellitus without complications: Secondary | ICD-10-CM | POA: Diagnosis not present

## 2023-02-15 LAB — GLUCOSE, CAPILLARY
Glucose-Capillary: 209 mg/dL — ABNORMAL HIGH (ref 70–99)
Glucose-Capillary: 234 mg/dL — ABNORMAL HIGH (ref 70–99)
Glucose-Capillary: 218 mg/dL — ABNORMAL HIGH (ref 70–99)
Glucose-Capillary: 220 mg/dL — ABNORMAL HIGH (ref 70–99)

## 2023-02-15 MED ORDER — NYSTATIN-TRIAMCINOLONE 100000-0.1 UNIT/GM-% EX OINT
TOPICAL_OINTMENT | Freq: Two times a day (BID) | CUTANEOUS | Status: DC
  Filled 2023-02-15 (×2): qty 15

## 2023-02-15 NOTE — Progress Notes (Signed)
PROGRESS NOTE     Brittany [name], is a 42 y.o. female, DOB - 1981/03/06, AYT:[id number]  Admit date - 02/13/2023   Admitting Physician Lilyan Gilford, DO  Outpatient Primary MD for the patient is Lianne Moris, PA-C  LOS - 0  Chief Complaint  Patient presents with   Leg Pain        Brief Narrative:   42 y.o. female with medical history significant of anxiety, lymphedema, diabetic, gastroparesis, GERD admitted on 02/14/2019 for complaints of left leg and ankle pain     -Assessment and Plan: 1)Lt Leg pain - Left lower extremity pain associated with lymphedema,  - Procalcitonin is undetectable and clinically leg appears more like lymphedema than cellulitis -Venous Dopplers without acute DVT -Left ankle x-rays without fractures or other acute findings -Chest pain discomfort is due to significant edema/lymphedema -Patient may benefit from referral to lymphedema clinic and lymphedema pump use -Wound care consult appreciated  2)Morbid Obesity- -Low calorie diet, portion control and increase physical activity discussed with patient -Body mass index is 77.98 kg/m. -Dietitian consult appreciated  3)DM2---A1c is 8.3 reflecting uncontrolled diabetes with hyperglycemia PTA - 65 units of long-acting insulin at baseline - Continue reduced dose of 40 units of basal insulin Use Novolog/Humalog Sliding scale insulin with Accu-Cheks/Fingersticks as ordered   4)Yeast dermatitis - In her popliteal fossa of the left lower extremity, lower abd pannus fold, inframammary areas  -Nystatin powder as ordered along with Mycolog ointment/cream  5) generalized weakness and deconditioning/ambulatory dysfunction-- --Complains of left lower extremity/ankle discomfort making it difficult for her to ambulate -Get physical therapy eval patient is not sure she wants home health PT  Disposition: The patient is from: Home              Anticipated d/c is to: Home after physical therapy eval               Anticipated d/c date is: 1 day              Patient currently is not medically stable to d/c. Barriers: Not Clinically Stable-   Code Status :  -  Code Status: Full Code   Family Communication:    (patient is alert, awake and coherent)  Discussed with husband at bedside  DVT Prophylaxis  :   - SCDs  heparin injection 5,000 Units Start: 02/14/23 0800   Lab Results  Component Value Date   PLT 173 02/15/2023    Inpatient Medications  Scheduled Meds:  fluconazole  150 mg Oral Daily   gabapentin  100 mg Oral TID   heparin  5,000 Units Subcutaneous Q8H   insulin aspart  0-15 Units Subcutaneous TID WC   insulin aspart  0-5 Units Subcutaneous QHS   insulin glargine-yfgn  40 Units Subcutaneous QHS   nystatin   Topical BID   Continuous Infusions: PRN Meds:.acetaminophen **OR** acetaminophen, ondansetron **OR** ondansetron (ZOFRAN) IV, oxyCODONE   Anti-infectives (From admission, onward)    Start     Dose/Rate Route Frequency Ordered Stop   02/15/23 1000  fluconazole (DIFLUCAN) tablet 150 mg        150 mg Oral Daily 02/14/23 1515 02/17/23 0959   02/14/23 1000  vancomycin (VANCOREADY) IVPB 2000 mg/400 mL  Status:  Discontinued        2,000 mg 200 mL/hr over 120 Minutes Intravenous Every 12 hours 02/14/23 0733 02/14/23 0736   02/14/23 0000  fluconazole (DIFLUCAN) tablet 150 mg        150 mg Oral  Once 02/13/23 2346 02/13/23 2356   02/13/23 2200  vancomycin (VANCOCIN) IVPB 1000 mg/200 mL premix        1,000 mg 200 mL/hr over 60 Minutes Intravenous Every 1 hr x 2 02/13/23 2146 02/14/23 0048   02/13/23 2145  vancomycin (VANCOCIN) 2,000 mg in sodium chloride 0.9 % 500 mL IVPB  Status:  Discontinued        2,000 mg 260 mL/hr over 120 Minutes Intravenous  Once 02/13/23 2143 02/13/23 2145         Subjective: Brittany [name] today has no fevers, no emesis,  No chest pain,   Husband at bedside, questions answered -Complains of left lower extremity/ankle discomfort making it  difficult for her to ambulate   Objective: Vitals:   02/14/23 1953 02/15/23 0327 02/15/23 1524 02/15/23 1934  BP: (!) 140/88 (!) 145/78 127/63 139/71  Pulse: 84 83 86 83  Resp: 18 (!) 22 20 18   Temp: 97.8 F (36.6 C) 98 F (36.7 C) (!) 97.3 F (36.3 C) 98.9 F (37.2 C)  TempSrc: Oral  Oral Oral  SpO2: 97% 92% 93% 96%  Weight:      Height:        Intake/Output Summary (Last 24 hours) at 02/15/2023 2011 Last data filed at 02/15/2023 1847 Gross per 24 hour  Intake 720 ml  Output --  Net 720 ml   Filed Weights   02/13/23 1854  Weight: (!) 260.8 kg    Physical Exam  Gen:- Awake Alert, morbidly obese HEENT:- [place].AT, No sclera icterus Neck-Supple Neck,No JVD,.  Lungs-  CTAB , fair symmetrical air movement CV- S1, S2 normal, regular  Abd-  +ve B.Sounds, Abd Soft, No tenderness, significant truncal adiposity/abdominal pannus Extremity/Skin:--Lymphedema of both lower extremities left more than right, erythematous rash with satellite lesions consistent with candidiasis located in popliteal fossa of the left lower extremity, lower abd pannus fold, inframammary areas  Psych-affect is appropriate, oriented x3 Neuro-no new focal deficits, no tremors  Data Reviewed: I have personally reviewed following labs and imaging studies  CBC: Recent Labs  Lab 02/13/23 2236 02/15/23 0416  WBC 5.7 4.7  NEUTROABS 3.9 3.0  HGB 12.4 11.5*  HCT 39.8 37.7  MCV 88.8 89.5  PLT 187 173   Basic Metabolic Panel: Recent Labs  Lab 02/13/23 2236 02/15/23 0416  NA 135 136  K 3.8 3.8  CL 105 106  CO2 23 24  GLUCOSE 242* 197*  BUN 12 10  CREATININE 0.89 0.77  CALCIUM 8.2* 8.2*  MG  --  1.9   GFR: Estimated Creatinine Clearance: 210 mL/min (by C-G formula based on SCr of 0.77 mg/dL). Liver Function Tests: Recent Labs  Lab 02/15/23 0416  AST 18  ALT 20  ALKPHOS 72  BILITOT 0.8  PROT 7.0  ALBUMIN 3.0*   Cardiac Enzymes: No results for input(s): "CKTOTAL", "CKMB", "CKMBINDEX",  "TROPONINI" in the last 168 hours. BNP (last 3 results) No results for input(s): "PROBNP" in the last 8760 hours. HbA1C: Recent Labs    02/13/23 2236  HGBA1C 8.6*   Radiology Studies: DG Ankle Complete Left  Result Date: 02/15/2023 CLINICAL DATA:  Pain EXAM: LEFT ANKLE COMPLETE - 3+ VIEW COMPARISON:  None Available. FINDINGS: No fracture or dislocation is seen. The ankle mortise is intact. The base of the fifth metatarsal is unremarkable. Visualized soft tissues are within normal limits. IMPRESSION: Negative. Electronically Signed   By: Charline Bills M.D.   On: 02/15/2023 19:22   US Venous Img Lower Unilateral Left (  DVT)  Result Date: 02/14/2023 CLINICAL DATA:  42 year old female with leg pain EXAM: LEFT LOWER EXTREMITY VENOUS DOPPLER ULTRASOUND TECHNIQUE: Gray-scale sonography with graded compression, as well as color Doppler and duplex ultrasound were performed to evaluate the lower extremity deep venous systems from the level of the common femoral vein and including the common femoral, femoral, profunda femoral, popliteal and calf veins including the posterior tibial, peroneal and gastrocnemius veins when visible. The superficial great saphenous vein was also interrogated. Spectral Doppler was utilized to evaluate flow at rest and with distal augmentation maneuvers in the common femoral, femoral and popliteal veins. COMPARISON:  None Available. FINDINGS: Contralateral Common Femoral Vein: Respiratory phasicity is normal and symmetric with the symptomatic side. No evidence of thrombus. Normal compressibility. Common Femoral Vein: No evidence of thrombus. Normal compressibility, respiratory phasicity and response to augmentation. Saphenofemoral Junction: No evidence of thrombus. Normal compressibility and flow on color Doppler imaging. Profunda Femoral Vein: No evidence of thrombus. Normal compressibility and flow on color Doppler imaging. Femoral Vein: No evidence of thrombus. Normal  compressibility, respiratory phasicity and response to augmentation. Popliteal Vein: No evidence of thrombus. Normal compressibility, respiratory phasicity and response to augmentation. Calf Veins: Limited evaluation of the calf veins. Superficial Great Saphenous Vein: No evidence of thrombus. Normal compressibility and flow on color Doppler imaging. Other Findings:  None. IMPRESSION: Directed duplex of the left lower extremity negative for DVT Signed, Yvone Neu. Miachel Roux, RPVI [facility] Radiology Electronically Signed   By: Gilmer Mor D.O.   On: 02/14/2023 10:25     Scheduled Meds:  fluconazole  150 mg Oral Daily   gabapentin  100 mg Oral TID   heparin  5,000 Units Subcutaneous Q8H   insulin aspart  0-15 Units Subcutaneous TID WC   insulin aspart  0-5 Units Subcutaneous QHS   insulin glargine-yfgn  40 Units Subcutaneous QHS   nystatin   Topical BID   Continuous Infusions:   LOS: 0 days    Shon Hale M.D on 02/15/2023 at 8:11 PM  Go to [email or web address] - for contact info  [facility] - Office  [phone]  If 7PM-7AM, please contact night-coverage [email or web address] 02/15/2023, 8:11 PM

## 2023-02-15 NOTE — Inpatient Diabetes Management (Signed)
Inpatient Diabetes Program Recommendations  AACE/ADA: New Consensus Statement on Inpatient Glycemic Control (2015)  Target Ranges:  Prepandial:   less than 140 mg/dL      Peak postprandial:   less than 180 mg/dL (1-2 hours)      Critically ill patients:  140 - 180 mg/dL    Latest Reference Range & Units 02/14/23 07:55 02/14/23 12:05 02/14/23 16:21 02/14/23 19:53  Glucose-Capillary 70 - 99 mg/dL 161 (H)  5 units Novolog  211 (H)  5 units Novolog  233 (H)  5 units Novolog  227 (H)  2 units Novolog @2105   40 units Semglee @2106   (H): Data is abnormally high  Latest Reference Range & Units 02/15/23 07:31  Glucose-Capillary 70 - 99 mg/dL 096 (H)  5 units Novolog @0903   (H): Data is abnormally high     Home DM Meds: Novolog 0-28 units TID per SSI       Semglee 23 units AM/ 90 units PM       Victoza 1.8 mg Daily   Current Orders: Semglee 40 units at bedtime     Novolog Moderate Correction Scale/ SSI (0-15 units) TID AC + HS     MD- Please consider:  1. Increase Semglee to 45 units at bedtime  2. Start Novolog Meal Coverage: Novolog 4 units TID with meals HOLD if pt NPO HOLD if pt eats <50% meals     --Will follow patient during hospitalization--  Ambrose Finland RN, MSN, CDCES Diabetes Coordinator Inpatient Glycemic Control Team Team Pager: [phone] (8a-5p)

## 2023-02-15 NOTE — Progress Notes (Signed)
Pain in left foot/ankle rated 10.  Getting prn oxy 5 mg and gabapentin 100 scheduled and pain still rated 10.  Tried ice pack and no relief.  New order for mycolog cream applied to ordered skin folds.  Patient ambulating to bathroom independently and stated has started menstruating

## 2023-02-15 NOTE — Progress Notes (Signed)
Patient was skeptical of wound care. She stated, "she doesn't typically put anything on her leg." She said she doesn't like bandages or anything sticky. Explained to her that silver Hydrofiber doesn't stick and isn't bandage like. Patient still skeptical. Wound care not done at this time.

## 2023-02-15 NOTE — Progress Notes (Signed)
Patient up to the bathroom. Blood along with a blood clot was noted in patient urine. Patient stated she is not on her monthly cycle at this time. MD Zierle-Ghosh notified.

## 2023-02-16 DIAGNOSIS — B372 Candidiasis of skin and nail: Secondary | ICD-10-CM | POA: Diagnosis not present

## 2023-02-16 DIAGNOSIS — E119 Type 2 diabetes mellitus without complications: Secondary | ICD-10-CM | POA: Diagnosis not present

## 2023-02-16 DIAGNOSIS — M79605 Pain in left leg: Secondary | ICD-10-CM | POA: Diagnosis not present

## 2023-02-16 LAB — GLUCOSE, CAPILLARY
Glucose-Capillary: 197 mg/dL — ABNORMAL HIGH (ref 70–99)
Glucose-Capillary: 212 mg/dL — ABNORMAL HIGH (ref 70–99)
Glucose-Capillary: 180 mg/dL — ABNORMAL HIGH (ref 70–99)

## 2023-02-16 MED ORDER — NYSTATIN-TRIAMCINOLONE 100000-0.1 UNIT/GM-% EX CREA: TOPICAL_CREAM | Freq: Two times a day (BID) | CUTANEOUS | 1 refills | Status: DC

## 2023-02-16 MED ORDER — HYDROCODONE-ACETAMINOPHEN 7.5-325 MG PO TABS: 1.0000 | ORAL_TABLET | Freq: Three times a day (TID) | ORAL | 0 refills | Status: DC | PRN

## 2023-02-16 MED ORDER — NYSTATIN 100000 UNIT/GM EX POWD: Freq: Two times a day (BID) | CUTANEOUS | 2 refills | Status: DC

## 2023-02-16 MED ORDER — NYSTATIN-TRIAMCINOLONE 100000-0.1 UNIT/GM-% EX CREA
TOPICAL_CREAM | Freq: Two times a day (BID) | CUTANEOUS | Status: DC
  Filled 2023-02-16: qty 15

## 2023-02-16 MED ORDER — GABAPENTIN 600 MG PO TABS: 600.0000 mg | ORAL_TABLET | Freq: Three times a day (TID) | ORAL | 3 refills | Status: AC

## 2023-02-16 NOTE — Progress Notes (Signed)
PT Cancellation /Discharge Note  Patient Details Name: Brittany [name] MRN: [id number] DOB: 23-Jun-1981   Cancelled Treatment:     Pt on the phone when therapist entered room.  Pt did not get off the phone and stated that she can walk no problem without assistive device.  Pt states she has a wheelchair at home for when she needs to go long distances.  When asked about her lymphedema pt states that she has gone to the Clinic in [place] and has been shown the lymphatic massage, she has compression garments but will not use them.  She has the compression pump but does not use it.   Pt states she does not need therapy we will discharge.    Virgina Organ, PT CLT [phone]  02/16/2023, 9:04 AM

## 2023-02-16 NOTE — Progress Notes (Signed)
Did not want oxy due to not effective for pain and causes her to be drowsy

## 2023-02-16 NOTE — Plan of Care (Signed)

## 2023-02-16 NOTE — Discharge Summary (Signed)
Brittany [name], is a 42 y.o. female  DOB August 04, 1980  MRN [id number].  Admission date:  02/13/2023  Admitting Physician  Lilyan Gilford, DO  Discharge Date:  02/16/2023   Primary MD  Lianne Moris, PA-C  Recommendations for primary care physician for things to follow:   1) outpatient follow-up with physical therapist who specializes in lymphedema therapy advised----you will likely benefit from lymphedema massage and lymphedema pumps---  2) you have declined referral to lymphedema physical therapy at this time, your primary care physician can help set up the referral if you change your mind in the future  Admission Diagnosis  Leg pain [M79.606] Cellulitis of right lower extremity [L03.115]   Discharge Diagnosis  Leg pain [M79.606] Cellulitis of right lower extremity [L03.115]    Principal Problem:   Leg pain Active Problems:   Yeast dermatitis   Diabetes mellitus without complication (HCC)   Morbid obesity with body mass index (BMI) greater than or equal to 70 in adult [facility])      Past Medical History:  Diagnosis Date   Anxiety    Carpal tunnel syndrome on left 10/12/2016   Cellulitis and abscess of leg    Chronic abdominal pain    Depression    Diabetes mellitus without complication (HCC)    Gallstones    Gastroparesis    GERD (gastroesophageal reflux disease) 08/13/2017   Headache    Hypothyroid 01/03/2018   Lymphedema    Sleep apnea    DX with sleep apnea - unable to use c-pap   Umbilical hernia     Past Surgical History:  Procedure Laterality Date   BIOPSY  11/11/2019   Procedure: BIOPSY;  Surgeon: Tressia Danas, MD;  Location: [facility];  Service: Gastroenterology;;   CESAREAN SECTION  11/09/01   ESOPHAGOGASTRODUODENOSCOPY (EGD) WITH PROPOFOL N/A 11/11/2019   Procedure: ESOPHAGOGASTRODUODENOSCOPY (EGD) WITH PROPOFOL;  Surgeon: Tressia Danas, MD;  Location: [facility];   Service: Gastroenterology;  Laterality: N/A;   HERNIA REPAIR     INSERTION OF MESH N/A 01/02/2013   Procedure: INSERTION OF MESH;  Surgeon: Lodema Pilot, DO;  Location: [facility];  Service: General;  Laterality: N/A;   UMBILICAL HERNIA REPAIR N/A 01/02/2013   Procedure: LAPAROSCOPIC UMBILICAL HERNIA;  Surgeon: Lodema Pilot, DO;  Location: [facility];  Service: General;  Laterality: N/A;     HPI  from the history and physical done on the day of admission:     HPI: Brittany [name] is a 42 y.o. female with medical history significant of anxiety, lymphedema, diabetic, gastroparesis, GERD, and more presents the ED with a chief complaint of left lower extremity pain.  Patient reports that it hurts from her knee to her ankle.  She reports that the leg is erythematous and painful to palpation.  She reports that this has happened before, where she gets crusting and drainage behind her left knee.  She usually uses Silvadene cream and it clears up.  This time it has not cleared up with Silvadene cream.  Patient reports that they  have been cleaning it daily, applying the cream and then cleaning in the night.  Patient reports this is the third day of symptoms.  She has not had a fever.  Patient thinks it started in her mid shin and radiated in both directions.  She describes the pain as feeling like she has a broken leg.  It is not necessarily worse with standing.  Nothing is made the pain better.  She tried ice on the leg and it did not help.  Patient reports she has had nausea and vomiting but they are not associated as she has gastroparesis and this is chronic for her.  Her last meal was more than 24 hours ago per patient.  Her last vomiting was around her last meal.  Patient denies any dysuria or diarrhea.  She does report an occasional cough.  She has no other complaints at this time.   Patient does not smoke and does not drink.  Patient is full code. Review of Systems: As mentioned in the history of present illness.  All other systems reviewed and are negative.    Hospital Course:   Assessment and Plan: Brief Narrative:   42 y.o. female with medical history significant of anxiety, lymphedema, diabetic, gastroparesis, GERD admitted on 02/14/2019 for complaints of left leg and ankle pain      -Assessment and Plan: 1)Lt Leg pain - Left lower extremity pain associated with lymphedema,  - Procalcitonin is undetectable and clinically leg appears more like lymphedema than cellulitis -Venous Dopplers without acute DVT -Left ankle x-rays without fractures or other acute findings -Leg pain/ discomfort is due to significant edema/lymphedema -Patient currently declines referral to lymphedema clinic and patient currently refuses lymphedema pump use -Wound care consult appreciated   2)Morbid Obesity- -Low calorie diet, portion control and increase physical activity discussed with patient -Body mass index is 77.98 kg/m. -Dietitian consult appreciated   3)DM2---A1c is 8.3 reflecting uncontrolled diabetes with hyperglycemia PTA -Continue Victoza, Semglee and NovoLog insulin -Lifestyle and dietary modifications advised   4)Yeast dermatitis - In her popliteal fossa of the left lower extremity, lower abd pannus fold, inframammary areas  -Nystatin powder as ordered along with Mycolog ointment/cream   5)Generalized weakness and deconditioning/ambulatory dysfunction-- --Complains of left lower extremity/ankle discomfort making it difficult for her to ambulate -Patient declined physical therapy eval, declined home health PT -   Disposition: The patient is from: Home              Anticipated d/c is to: Home patient declines outpatient or at home physical therapy at this time  Discharge Condition: stable  Follow UP   Follow-up Information     Lianne Moris, PA-C. Schedule an appointment as soon as possible for a visit in 1 week(s).   Specialty: Family Medicine Contact information: [address] [place]  [address] [phone]                 Diet and Activity recommendation:  As advised  Discharge Instructions    Discharge Instructions     Amb Referral to Nutrition and Diabetic Education   Complete by: As directed    Please schedule at [facility] location   Call MD for:  difficulty breathing, headache or visual disturbances   Complete by: As directed    Call MD for:  persistant dizziness or light-headedness   Complete by: As directed    Call MD for:  persistant nausea and vomiting   Complete by: As directed    Call  MD for:  redness, tenderness, or signs of infection (pain, swelling, redness, odor or green/yellow discharge around incision site)   Complete by: As directed    Call MD for:  temperature >100.4   Complete by: As directed    Diet - low sodium heart healthy   Complete by: As directed    Discharge instructions   Complete by: As directed    1) outpatient follow-up with physical therapist who specializes in lymphedema therapy advised----you will likely benefit from lymphedema massage and lymphedema pumps---  2) you have declined referral to lymphedema physical therapy at this time, your primary care physician can help set up the referral if you change your mind in the future   Discharge wound care:   Complete by: As directed    As above   Increase activity slowly   Complete by: As directed          Discharge Medications     Allergies as of 02/16/2023       Reactions   Paxil [paroxetine Hcl] Other (See Comments)   Suicidal thoughts   Morphine And Codeine Other (See Comments)   "it just freaks me out"   Latex Itching, Swelling, Rash   Lipitor [atorvastatin] Rash   Sulfa Antibiotics Nausea And Vomiting, Rash        Medication List     STOP taking these medications    SSD 1 % cream Generic drug: silver sulfADIAZINE       TAKE these medications    acetaminophen 500 MG tablet Commonly known as: TYLENOL Take 1,000 mg by mouth every 8 (eight)  hours as needed for moderate pain, headache or fever.   gabapentin 600 MG tablet Commonly known as: NEURONTIN Take 1 tablet (600 mg total) by mouth 3 (three) times daily.   HYDROcodone-acetaminophen 7.5-325 MG tablet Commonly known as: NORCO Take 1 tablet by mouth every 8 (eight) hours as needed for moderate pain.   insulin aspart 100 UNIT/ML injection Commonly known as: novoLOG SLIDING SCALE 3 TIMES DAILY WITH MEALS: For blood sugar 70-120-no insulin. Blood sugar 121-150 take 5 units. Blood sugar 151-208 take 8 units. Blood sugar 201-250 take 12 units. Blood sugar 251-300 take 17 units. Blood sugar 301-350 take 20 units. Blood sugar 351-400 take 25 units. Blood sugar greater than 400 take 28 units and call your doctor.   nystatin powder Commonly known as: MYCOSTATIN/NYSTOP Apply topically 2 (two) times daily.   nystatin-triamcinolone cream Commonly known as: MYCOLOG II Apply topically 2 (two) times daily.   ondansetron 8 MG disintegrating tablet Commonly known as: ZOFRAN-ODT Take 8 mg by mouth every 8 (eight) hours as needed for nausea or vomiting.   Semglee (yfgn) 100 UNIT/ML Pen Generic drug: insulin glargine-yfgn Inject 23-90 Units into the skin See admin instructions. Inject 23 units before breakfast and 90 units at bedtime.   Victoza 18 MG/3ML Sopn Generic drug: liraglutide Inject 1.8 mg into the skin daily.               Discharge Care Instructions  (From admission, onward)           Start     Ordered   02/16/23 0000  Discharge wound care:       Comments: As above   02/16/23 1605            Major procedures and Radiology Reports - PLEASE review detailed and final reports for all details, in brief -   DG Ankle Complete Left  Result Date:  02/15/2023 CLINICAL DATA:  Pain EXAM: LEFT ANKLE COMPLETE - 3+ VIEW COMPARISON:  None Available. FINDINGS: No fracture or dislocation is seen. The ankle mortise is intact. The base of the fifth metatarsal is  unremarkable. Visualized soft tissues are within normal limits. IMPRESSION: Negative. Electronically Signed   By: Charline Bills M.D.   On: 02/15/2023 19:22   US Venous Img Lower Unilateral Left (DVT)  Result Date: 02/14/2023 CLINICAL DATA:  42 year old female with leg pain EXAM: LEFT LOWER EXTREMITY VENOUS DOPPLER ULTRASOUND TECHNIQUE: Gray-scale sonography with graded compression, as well as color Doppler and duplex ultrasound were performed to evaluate the lower extremity deep venous systems from the level of the common femoral vein and including the common femoral, femoral, profunda femoral, popliteal and calf veins including the posterior tibial, peroneal and gastrocnemius veins when visible. The superficial great saphenous vein was also interrogated. Spectral Doppler was utilized to evaluate flow at rest and with distal augmentation maneuvers in the common femoral, femoral and popliteal veins. COMPARISON:  None Available. FINDINGS: Contralateral Common Femoral Vein: Respiratory phasicity is normal and symmetric with the symptomatic side. No evidence of thrombus. Normal compressibility. Common Femoral Vein: No evidence of thrombus. Normal compressibility, respiratory phasicity and response to augmentation. Saphenofemoral Junction: No evidence of thrombus. Normal compressibility and flow on color Doppler imaging. Profunda Femoral Vein: No evidence of thrombus. Normal compressibility and flow on color Doppler imaging. Femoral Vein: No evidence of thrombus. Normal compressibility, respiratory phasicity and response to augmentation. Popliteal Vein: No evidence of thrombus. Normal compressibility, respiratory phasicity and response to augmentation. Calf Veins: Limited evaluation of the calf veins. Superficial Great Saphenous Vein: No evidence of thrombus. Normal compressibility and flow on color Doppler imaging. Other Findings:  None. IMPRESSION: Directed duplex of the left lower extremity negative for DVT  Signed, Yvone Neu. Miachel Roux, RPVI [facility] Electronically Signed   By: Gilmer Mor D.O.   On: 02/14/2023 10:25    Today   Subjective    Danah Bern today has no new complaints No fever  Or chills   No Nausea, Vomiting or Diarrhea Left leg pains are improving -Husband on the video phone, questions answered  Patient has been seen and examined prior to discharge   Objective   Blood pressure (!) 151/88, pulse 85, temperature 98.5 F (36.9 C), temperature source Oral, resp. rate 19, height 6' (1.829 m), weight (!) 260.8 kg, SpO2 93%.   Intake/Output Summary (Last 24 hours) at 02/16/2023 1607 Last data filed at 02/15/2023 1847 Gross per 24 hour  Intake 240 ml  Output --  Net 240 ml    Exam Gen:- Awake Alert, morbidly obese HEENT:- [place].AT, No sclera icterus Neck-Supple Neck,No JVD,.  Lungs-  CTAB , fair symmetrical air movement CV- S1, S2 normal, regular  Abd-  +ve B.Sounds, Abd Soft, No tenderness, significant truncal adiposity/abdominal pannus Extremity/Skin:--Lymphedema of both lower extremities left more than right, erythematous rash with satellite lesions consistent with candidiasis located in popliteal fossa of the left lower extremity, lower abd pannus fold, inframammary areas  Psych-affect is appropriate, oriented x3 Neuro-no new focal deficits, no tremors   Data Review   CBC w Diff:  Lab Results  Component Value Date   WBC 4.7 02/15/2023   HGB 11.5 (L) 02/15/2023   HCT 37.7 02/15/2023   PLT 173 02/15/2023   LYMPHOPCT 26 02/15/2023   MONOPCT 6 02/15/2023   EOSPCT 3 02/15/2023   BASOPCT 0 02/15/2023    CMP:  Lab Results  Component Value Date   NA 136 02/15/2023   K 3.8 02/15/2023   CL 106 02/15/2023   CO2 24 02/15/2023   BUN 10 02/15/2023   CREATININE 0.77 02/15/2023   CREATININE 0.94 04/12/2018   PROT 7.0 02/15/2023   ALBUMIN 3.0 (L) 02/15/2023   BILITOT 0.8 02/15/2023   ALKPHOS 72  02/15/2023   AST 18 02/15/2023   ALT 20 02/15/2023   Total Discharge time is about 33 minutes  Shon Hale M.D on 02/16/2023 at 4:07 PM  Go to [email or web address] -  for contact info  [facility] - Office  [phone]

## 2023-02-22 ENCOUNTER — Other Ambulatory Visit (HOSPITAL_COMMUNITY): Payer: Self-pay

## 2023-02-22 ENCOUNTER — Telehealth (HOSPITAL_COMMUNITY): Payer: Self-pay | Admitting: Pharmacy Technician

## 2023-02-22 NOTE — Telephone Encounter (Signed)
Pharmacy Patient Advocate Encounter  Received notification from [facility] that Prior Authorization for Nystatin-Triamcinolone 100000-0.1UNIT/GM-% cream has been APPROVED from 02/22/2023 to 02/21/2024   PA #/Case ID/Reference #: [id number] Key: [id number]

## 2023-02-23 ENCOUNTER — Other Ambulatory Visit: Payer: Self-pay | Admitting: Nurse Practitioner

## 2023-05-07 ENCOUNTER — Emergency Department (HOSPITAL_COMMUNITY)
Admission: EM | Admit: 2023-05-07 | Discharge: 2023-05-08 | Disposition: A | Discharge status: Home / Self Care | Payer: Medicaid Other | Attending: Emergency Medicine | Admitting: Emergency Medicine

## 2023-05-07 ENCOUNTER — Other Ambulatory Visit: Payer: Self-pay

## 2023-05-07 ENCOUNTER — Encounter (HOSPITAL_COMMUNITY): Payer: Self-pay | Admitting: Radiology

## 2023-05-07 DIAGNOSIS — Z91040 Latex allergy status: Secondary | ICD-10-CM | POA: Insufficient documentation

## 2023-05-07 DIAGNOSIS — E1165 Type 2 diabetes mellitus with hyperglycemia: Secondary | ICD-10-CM | POA: Insufficient documentation

## 2023-05-07 DIAGNOSIS — R739 Hyperglycemia, unspecified: Secondary | ICD-10-CM | POA: Diagnosis present

## 2023-05-07 DIAGNOSIS — Z794 Long term (current) use of insulin: Secondary | ICD-10-CM | POA: Diagnosis not present

## 2023-05-07 LAB — COMPREHENSIVE METABOLIC PANEL
ALT: 27 U/L (ref 0–44)
AST: 23 U/L (ref 15–41)
Albumin: 3.5 g/dL (ref 3.5–5.0)
Alkaline Phosphatase: 83 U/L (ref 38–126)
Anion gap: 8 (ref 5–15)
BUN: 13 mg/dL (ref 6–20)
CO2: 26 mmol/L (ref 22–32)
Calcium: 8.8 mg/dL — ABNORMAL LOW (ref 8.9–10.3)
Chloride: 104 mmol/L (ref 98–111)
Creatinine, Ser: 1.09 mg/dL — ABNORMAL HIGH (ref 0.44–1.00)
GFR, Estimated: >60 mL/min (ref >60)
Glucose, Bld: 371 mg/dL — ABNORMAL HIGH (ref 70–99)
Potassium: 4.2 mmol/L (ref 3.5–5.1)
Sodium: 138 mmol/L (ref 135–145)
Total Bilirubin: 0.4 mg/dL (ref 0.3–1.2)
Total Protein: 7.7 g/dL (ref 6.5–8.1)

## 2023-05-07 LAB — BETA-HYDROXYBUTYRIC ACID: Beta-Hydroxybutyric Acid: 0.06 mmol/L (ref 0.05–0.27)

## 2023-05-07 LAB — URINALYSIS, ROUTINE W REFLEX MICROSCOPIC
Bilirubin Urine: NEGATIVE
Glucose, UA: >=500 mg/dL — AB
Hgb urine dipstick: NEGATIVE
Ketones, ur: NEGATIVE mg/dL
Leukocytes,Ua: NEGATIVE
Nitrite: NEGATIVE
Protein, ur: 30 mg/dL — AB
Specific Gravity, Urine: 1.026 (ref 1.005–1.030)
pH: 6 (ref 5.0–8.0)

## 2023-05-07 LAB — CBC
HCT: 40.9 % (ref 36.0–46.0)
Hemoglobin: 12.9 g/dL (ref 12.0–15.0)
MCH: 28 pg (ref 26.0–34.0)
MCHC: 31.5 g/dL (ref 30.0–36.0)
MCV: 88.9 fL (ref 80.0–100.0)
Platelets: 182 10*3/uL (ref 150–400)
RBC: 4.6 MIL/uL (ref 3.87–5.11)
RDW: 14.6 % (ref 11.5–15.5)
WBC: 4.9 10*3/uL (ref 4.0–10.5)
nRBC: 0 % (ref 0.0–0.2)

## 2023-05-07 LAB — BLOOD GAS, VENOUS
Acid-Base Excess: 1 mmol/L (ref 0.0–2.0)
Bicarbonate: 26.6 mmol/L (ref 20.0–28.0)
Drawn by: 1517
O2 Saturation: 69.7 %
Patient temperature: 36.7
pCO2, Ven: 44 mm[Hg] (ref 44–60)
pH, Ven: 7.38 (ref 7.25–7.43)
pO2, Ven: 37 mm[Hg] (ref 32–45)

## 2023-05-07 LAB — CBG MONITORING, ED
Glucose-Capillary: 348 mg/dL — ABNORMAL HIGH (ref 70–99)
Glucose-Capillary: 360 mg/dL — ABNORMAL HIGH (ref 70–99)

## 2023-05-07 LAB — POC URINE PREG, ED: Preg Test, Ur: NEGATIVE

## 2023-05-07 MED ORDER — SODIUM CHLORIDE 0.9 % IV BOLUS
1000.0000 mL | Freq: Once | INTRAVENOUS | Status: AC
  Administered 2023-05-07: 1000 mL via INTRAVENOUS

## 2023-05-07 MED ORDER — INSULIN ASPART 100 UNIT/ML IV SOLN
8.0000 [IU] | Freq: Once | INTRAVENOUS | Status: AC
  Administered 2023-05-07: 8 [IU] via INTRAVENOUS

## 2023-05-07 NOTE — ED Provider Notes (Signed)
[place] EMERGENCY DEPARTMENT AT [facility] Provider Note   CSN: [id number] Arrival date & time: 05/07/23  2132     History  Chief Complaint  Patient presents with   Hyperglycemia    Brittany [name] is a 42 y.o. female.  Patient is a 42 year old female with past medical history of diabetes, gastroparesis, lymphedema, obesity.  Patient presenting today for evaluation of elevated blood sugar.  She states that she checked her blood sugar at home and got a reading of over 500.  She took her regular dose of insulin, then called her doctor who advised her to come to the ER to be evaluated.  Patient denies other symptoms such as abdominal pain, vomiting, diarrhea, or other issues.  The history is provided by the patient.       Home Medications Prior to Admission medications   Medication Sig Start Date End Date Taking? Authorizing Provider  acetaminophen (TYLENOL) 500 MG tablet Take 1,000 mg by mouth every 8 (eight) hours as needed for moderate pain, headache or fever.    [provider]  gabapentin (NEURONTIN) 600 MG tablet Take 1 tablet (600 mg total) by mouth 3 (three) times daily. 02/16/23   Shon Hale, MD  HYDROcodone-acetaminophen (NORCO) 7.5-325 MG tablet Take 1 tablet by mouth every 8 (eight) hours as needed for moderate pain. 02/16/23   Emokpae, Courage, MD  insulin aspart (NOVOLOG) 100 UNIT/ML injection SLIDING SCALE 3 TIMES DAILY WITH MEALS: For blood sugar 70-120-no insulin. Blood sugar 121-150 take 5 units. Blood sugar 151-208 take 8 units. Blood sugar 201-250 take 12 units. Blood sugar 251-300 take 17 units. Blood sugar 301-350 take 20 units. Blood sugar 351-400 take 25 units. Blood sugar greater than 400 take 28 units and call your doctor. 03/27/16   Devoria Albe, MD  insulin glargine-yfgn (SEMGLEE, YFGN,) 100 UNIT/ML Pen Inject 23-90 Units into the skin See admin instructions. Inject 23 units before breakfast and 90 units at bedtime.    [provider]  nystatin (MYCOSTATIN/NYSTOP) powder Apply topically 2 (two) times daily. 02/16/23   Shon Hale, MD  nystatin-triamcinolone (MYCOLOG II) cream Apply topically 2 (two) times daily. 02/16/23   Shon Hale, MD  ondansetron (ZOFRAN-ODT) 8 MG disintegrating tablet Take 8 mg by mouth every 8 (eight) hours as needed for nausea or vomiting.    [provider]  VICTOZA 18 MG/3ML SOPN Inject 1.8 mg into the skin daily. 07/11/19   [provider]      Allergies    Paxil [paroxetine hcl], Morphine and codeine, Latex, Lipitor [atorvastatin], and Sulfa antibiotics    Review of Systems   Review of Systems  All other systems reviewed and are negative.   Physical Exam Updated Vital Signs BP (!) 165/96 (BP Location: Right Arm)   Pulse 84   Temp 98 F (36.7 C) (Oral)   Resp 17   Ht 5\' 11"  (1.803 m)   Wt (!) 260.8 kg   SpO2 97%   BMI 80.20 kg/m  Physical Exam Vitals and nursing note reviewed.  Constitutional:      General: She is not in acute distress.    Appearance: She is well-developed. She is not diaphoretic.  HENT:     Head: Normocephalic and atraumatic.  Cardiovascular:     Rate and Rhythm: Normal rate and regular rhythm.     Heart sounds: No murmur heard.    No friction rub. No gallop.  Pulmonary:     Effort: Pulmonary effort is normal.  No respiratory distress.     Breath sounds: Normal breath sounds. No wheezing.  Abdominal:     General: Bowel sounds are normal. There is no distension.     Palpations: Abdomen is soft.     Tenderness: There is no abdominal tenderness.  Musculoskeletal:        General: Normal range of motion.     Cervical back: Normal range of motion and neck supple.  Skin:    General: Skin is warm and dry.  Neurological:     General: No focal deficit present.     Mental Status: She is alert and oriented to person, place, and time.     ED Results / Procedures / Treatments   Labs (all labs ordered are listed, but only  abnormal results are displayed) Labs Reviewed  URINALYSIS, ROUTINE W REFLEX MICROSCOPIC - Abnormal; Notable for the following components:      Result Value   Glucose, UA >=500 (*)    Protein, ur 30 (*)    Bacteria, UA RARE (*)    All other components within normal limits  COMPREHENSIVE METABOLIC PANEL - Abnormal; Notable for the following components:   Glucose, Bld 371 (*)    Creatinine, Ser 1.09 (*)    Calcium 8.8 (*)    All other components within normal limits  CBG MONITORING, ED - Abnormal; Notable for the following components:   Glucose-Capillary 360 (*)    All other components within normal limits  CBC  BLOOD GAS, VENOUS  BETA-HYDROXYBUTYRIC ACID  POC URINE PREG, ED    EKG EKG Interpretation Date/Time:  Sunday May 07 2023 21:58:36 EDT Ventricular Rate:  85 PR Interval:  192 QRS Duration:  100 QT Interval:  378 QTC Calculation: 449 R Axis:   70  Text Interpretation: Normal sinus rhythm Cannot rule out Anterior infarct (cited on or before 07-May-2023) Abnormal ECG When compared with ECG of 17-Jan-2021 20:41, No significant change was found Confirmed by Anders Simmonds [phone]) on 05/07/2023 10:06:30 PM  Radiology No results found.  Procedures Procedures  {Document cardiac monitor, telemetry assessment procedure when appropriate:1}  Medications Ordered in ED Medications  sodium chloride 0.9 % bolus 1,000 mL (has no administration in time range)  insulin aspart (novoLOG) injection 8 Units (has no administration in time range)    ED Course/ Medical Decision Making/ A&P   {   Click here for ABCD2, HEART and other calculatorsREFRESH Note before signing :1}                              Medical Decision Making Amount and/or Complexity of Data Reviewed Labs: ordered.  Risk OTC drugs.   ***  {Document critical care time when appropriate:1} {Document review of labs and clinical decision tools ie heart score, Chads2Vasc2 etc:1}  {Document your independent  review of radiology images, and any outside records:1} {Document your discussion with family members, caretakers, and with consultants:1} {Document social determinants of health affecting pt's care:1} {Document your decision making why or why not admission, treatments were needed:1} Final Clinical Impression(s) / ED Diagnoses Final diagnoses:  None    Rx / DC Orders ED Discharge Orders     None

## 2023-05-07 NOTE — ED Triage Notes (Signed)
Pt states her blood sugars have been in the 400s and non of her medications are bring it down.

## 2023-05-08 LAB — CBG MONITORING, ED: Glucose-Capillary: 301 mg/dL — ABNORMAL HIGH (ref 70–99)

## 2023-05-08 NOTE — Discharge Instructions (Signed)
Continue medications as previously prescribed.  Keep a record of your blood sugars at home and follow-up with your doctor later this week.  Return to the ER if symptoms worsen or change.

## 2023-06-09 ENCOUNTER — Inpatient Hospital Stay (HOSPITAL_COMMUNITY)
Admission: EM | Admit: 2023-06-09 | Discharge: 2023-06-15 | DRG: 872 | Disposition: A | Discharge status: Home / Self Care | Payer: Medicaid Other | Attending: Family Medicine | Admitting: Family Medicine

## 2023-06-09 ENCOUNTER — Encounter (HOSPITAL_COMMUNITY): Payer: Self-pay

## 2023-06-09 ENCOUNTER — Emergency Department (HOSPITAL_COMMUNITY): Payer: Medicaid Other

## 2023-06-09 ENCOUNTER — Other Ambulatory Visit: Payer: Self-pay

## 2023-06-09 DIAGNOSIS — Z888 Allergy status to other drugs, medicaments and biological substances status: Secondary | ICD-10-CM | POA: Diagnosis not present

## 2023-06-09 DIAGNOSIS — Z1152 Encounter for screening for COVID-19: Secondary | ICD-10-CM | POA: Diagnosis not present

## 2023-06-09 DIAGNOSIS — B372 Candidiasis of skin and nail: Secondary | ICD-10-CM | POA: Diagnosis present

## 2023-06-09 DIAGNOSIS — Z8249 Family history of ischemic heart disease and other diseases of the circulatory system: Secondary | ICD-10-CM | POA: Diagnosis not present

## 2023-06-09 DIAGNOSIS — F32A Depression, unspecified: Secondary | ICD-10-CM | POA: Diagnosis present

## 2023-06-09 DIAGNOSIS — E860 Dehydration: Secondary | ICD-10-CM | POA: Diagnosis present

## 2023-06-09 DIAGNOSIS — E1165 Type 2 diabetes mellitus with hyperglycemia: Secondary | ICD-10-CM | POA: Diagnosis present

## 2023-06-09 DIAGNOSIS — I890 Lymphedema, not elsewhere classified: Secondary | ICD-10-CM | POA: Diagnosis present

## 2023-06-09 DIAGNOSIS — E039 Hypothyroidism, unspecified: Secondary | ICD-10-CM | POA: Diagnosis present

## 2023-06-09 DIAGNOSIS — L0390 Cellulitis, unspecified: Secondary | ICD-10-CM

## 2023-06-09 DIAGNOSIS — E8809 Other disorders of plasma-protein metabolism, not elsewhere classified: Secondary | ICD-10-CM | POA: Diagnosis present

## 2023-06-09 DIAGNOSIS — R7881 Bacteremia: Secondary | ICD-10-CM | POA: Diagnosis not present

## 2023-06-09 DIAGNOSIS — Z6841 Body Mass Index (BMI) 40.0 and over, adult: Secondary | ICD-10-CM

## 2023-06-09 DIAGNOSIS — E871 Hypo-osmolality and hyponatremia: Secondary | ICD-10-CM | POA: Diagnosis present

## 2023-06-09 DIAGNOSIS — Z818 Family history of other mental and behavioral disorders: Secondary | ICD-10-CM

## 2023-06-09 DIAGNOSIS — K3184 Gastroparesis: Secondary | ICD-10-CM | POA: Diagnosis present

## 2023-06-09 DIAGNOSIS — K219 Gastro-esophageal reflux disease without esophagitis: Secondary | ICD-10-CM | POA: Diagnosis present

## 2023-06-09 DIAGNOSIS — E46 Unspecified protein-calorie malnutrition: Secondary | ICD-10-CM | POA: Diagnosis present

## 2023-06-09 DIAGNOSIS — Z882 Allergy status to sulfonamides status: Secondary | ICD-10-CM

## 2023-06-09 DIAGNOSIS — Z801 Family history of malignant neoplasm of trachea, bronchus and lung: Secondary | ICD-10-CM

## 2023-06-09 DIAGNOSIS — Z79899 Other long term (current) drug therapy: Secondary | ICD-10-CM | POA: Diagnosis not present

## 2023-06-09 DIAGNOSIS — L03115 Cellulitis of right lower limb: Principal | ICD-10-CM | POA: Diagnosis present

## 2023-06-09 DIAGNOSIS — Z91040 Latex allergy status: Secondary | ICD-10-CM

## 2023-06-09 DIAGNOSIS — Z83438 Family history of other disorder of lipoprotein metabolism and other lipidemia: Secondary | ICD-10-CM

## 2023-06-09 DIAGNOSIS — E1143 Type 2 diabetes mellitus with diabetic autonomic (poly)neuropathy: Secondary | ICD-10-CM | POA: Diagnosis present

## 2023-06-09 DIAGNOSIS — A401 Sepsis due to streptococcus, group B: Principal | ICD-10-CM | POA: Diagnosis present

## 2023-06-09 DIAGNOSIS — A419 Sepsis, unspecified organism: Secondary | ICD-10-CM | POA: Diagnosis present

## 2023-06-09 DIAGNOSIS — Z794 Long term (current) use of insulin: Secondary | ICD-10-CM | POA: Diagnosis not present

## 2023-06-09 DIAGNOSIS — R262 Difficulty in walking, not elsewhere classified: Secondary | ICD-10-CM | POA: Diagnosis present

## 2023-06-09 DIAGNOSIS — Z833 Family history of diabetes mellitus: Secondary | ICD-10-CM

## 2023-06-09 DIAGNOSIS — Z806 Family history of leukemia: Secondary | ICD-10-CM

## 2023-06-09 LAB — URINALYSIS, W/ REFLEX TO CULTURE (INFECTION SUSPECTED)
Bacteria, UA: NONE SEEN
Bilirubin Urine: NEGATIVE
Glucose, UA: 50 mg/dL — AB
Ketones, ur: 5 mg/dL — AB
Leukocytes,Ua: NEGATIVE
Nitrite: NEGATIVE
Protein, ur: 100 mg/dL — AB
Specific Gravity, Urine: 1.023 (ref 1.005–1.030)
pH: 6 (ref 5.0–8.0)

## 2023-06-09 LAB — RESP PANEL BY RT-PCR (RSV, FLU A&B, COVID)  RVPGX2
Influenza A by PCR: NEGATIVE
Influenza B by PCR: NEGATIVE
Resp Syncytial Virus by PCR: NEGATIVE
SARS Coronavirus 2 by RT PCR: NEGATIVE

## 2023-06-09 LAB — COMPREHENSIVE METABOLIC PANEL
ALT: 21 U/L (ref 0–44)
AST: 19 U/L (ref 15–41)
Albumin: 3.3 g/dL — ABNORMAL LOW (ref 3.5–5.0)
Alkaline Phosphatase: 72 U/L (ref 38–126)
Anion gap: 11 (ref 5–15)
BUN: 12 mg/dL (ref 6–20)
CO2: 22 mmol/L (ref 22–32)
Calcium: 8.6 mg/dL — ABNORMAL LOW (ref 8.9–10.3)
Chloride: 100 mmol/L (ref 98–111)
Creatinine, Ser: 1 mg/dL (ref 0.44–1.00)
GFR, Estimated: >60 mL/min (ref >60)
Glucose, Bld: 250 mg/dL — ABNORMAL HIGH (ref 70–99)
Potassium: 4.3 mmol/L (ref 3.5–5.1)
Sodium: 133 mmol/L — ABNORMAL LOW (ref 135–145)
Total Bilirubin: 1 mg/dL (ref <1.2)
Total Protein: 7.8 g/dL (ref 6.5–8.1)

## 2023-06-09 LAB — CBC WITH DIFFERENTIAL/PLATELET
Abs Immature Granulocytes: 0.05 10*3/uL (ref 0.00–0.07)
Basophils Absolute: 0 10*3/uL (ref 0.0–0.1)
Basophils Relative: 0 %
Eosinophils Absolute: 0 10*3/uL (ref 0.0–0.5)
Eosinophils Relative: 0 %
HCT: 41.9 % (ref 36.0–46.0)
Hemoglobin: 13 g/dL (ref 12.0–15.0)
Immature Granulocytes: 0 %
Lymphocytes Relative: 5 %
Lymphs Abs: 0.6 10*3/uL — ABNORMAL LOW (ref 0.7–4.0)
MCH: 27.7 pg (ref 26.0–34.0)
MCHC: 31 g/dL (ref 30.0–36.0)
MCV: 89.1 fL (ref 80.0–100.0)
Monocytes Absolute: 0.5 10*3/uL (ref 0.1–1.0)
Monocytes Relative: 4 %
Neutro Abs: 11.5 10*3/uL — ABNORMAL HIGH (ref 1.7–7.7)
Neutrophils Relative %: 91 %
Platelets: 145 10*3/uL — ABNORMAL LOW (ref 150–400)
RBC: 4.7 MIL/uL (ref 3.87–5.11)
RDW: 15.5 % (ref 11.5–15.5)
WBC: 12.7 10*3/uL — ABNORMAL HIGH (ref 4.0–10.5)
nRBC: 0 % (ref 0.0–0.2)

## 2023-06-09 LAB — APTT: aPTT: <22 s — ABNORMAL LOW (ref 24–36)

## 2023-06-09 LAB — PROTIME-INR
INR: 1.1 (ref 0.8–1.2)
Prothrombin Time: 14.6 s (ref 11.4–15.2)

## 2023-06-09 LAB — POC URINE PREG, ED: Preg Test, Ur: NEGATIVE

## 2023-06-09 LAB — CBG MONITORING, ED: Glucose-Capillary: 211 mg/dL — ABNORMAL HIGH (ref 70–99)

## 2023-06-09 LAB — LACTIC ACID, PLASMA
Lactic Acid, Venous: 1.4 mmol/L (ref 0.5–1.9)
Lactic Acid, Venous: 1.7 mmol/L (ref 0.5–1.9)

## 2023-06-09 MED ORDER — SODIUM CHLORIDE 0.9 % IV SOLN
2.0000 g | INTRAVENOUS | Status: DC
Start: 2023-06-09 — End: 2023-06-11
  Administered 2023-06-09 – 2023-06-10 (×2): 2 g via INTRAVENOUS
  Filled 2023-06-09 (×2): qty 20

## 2023-06-09 MED ORDER — LACTATED RINGERS IV SOLN: INTRAVENOUS | Status: AC

## 2023-06-09 MED ORDER — ONDANSETRON HCL 4 MG/2ML IJ SOLN
4.0000 mg | Freq: Once | INTRAMUSCULAR | Status: AC
  Administered 2023-06-09: 4 mg via INTRAVENOUS
  Filled 2023-06-09: qty 2

## 2023-06-09 MED ORDER — LACTATED RINGERS IV BOLUS (SEPSIS)
1000.0000 mL | Freq: Once | INTRAVENOUS | Status: AC
  Administered 2023-06-09: 1000 mL via INTRAVENOUS

## 2023-06-09 MED ORDER — KETOROLAC TROMETHAMINE 30 MG/ML IJ SOLN
15.0000 mg | Freq: Once | INTRAMUSCULAR | Status: AC
  Administered 2023-06-09: 15 mg via INTRAVENOUS
  Filled 2023-06-09: qty 1

## 2023-06-09 MED ORDER — LACTATED RINGERS IV BOLUS (SEPSIS)
200.0000 mL | Freq: Once | INTRAVENOUS | Status: AC
  Administered 2023-06-09: 200 mL via INTRAVENOUS

## 2023-06-09 MED ORDER — ACETAMINOPHEN 325 MG PO TABS
650.0000 mg | ORAL_TABLET | Freq: Once | ORAL | Status: AC
  Administered 2023-06-09: 650 mg via ORAL
  Filled 2023-06-09: qty 2

## 2023-06-09 NOTE — Sepsis Progress Note (Signed)
Elink monitoring for the code sepsis protocol.  

## 2023-06-09 NOTE — ED Triage Notes (Addendum)
Pt reports she has "masses" in her legs and she is concerned the right one is turning into cellulitis because it is red.  Pt also has a cough and had a fever yesterday but the fever is gone today.  Pt is only concerned with her "masses".

## 2023-06-09 NOTE — ED Provider Notes (Signed)
[place] [facility]MENT AT [facility] Provider Note   CSN: [id number] Arrival date & time: 06/09/23  1511     History  Chief Complaint  Patient presents with   Leg Pain    Brittany [name] is a 42 y.o. female with a history significant for type 2 diabetes, GERD, history of sepsis secondary to cellulitis, lymphedema of her lower extremities, morbid obesity presenting for evaluation of pain and erythema localizing to her right leg which she first noticed yesterday in association with fevers and chills, with a Tmax of 101 yesterday evening.  She describes significant tenderness worsened with palpation, this infection started in her right medial thigh but is now having some pain into her right calf as well.  She also endorses a nonproductive cough, denies shortness of breath, chest pain, also no vomiting but has had some nausea.  She took Tylenol for fever reduction.  She is found no other alleviators for her symptoms.  The history is provided by the patient.       Home Medications Prior to Admission medications   Medication Sig Start Date End Date Taking? Authorizing Provider  acetaminophen (TYLENOL) 500 MG tablet Take 1,000 mg by mouth every 8 (eight) hours as needed for moderate pain, headache or fever.    [provider]  gabapentin (NEURONTIN) 600 MG tablet Take 1 tablet (600 mg total) by mouth 3 (three) times daily. 02/16/23   Shon Hale, MD  HYDROcodone-acetaminophen (NORCO) 7.5-325 MG tablet Take 1 tablet by mouth every 8 (eight) hours as needed for moderate pain. 02/16/23   Emokpae, Courage, MD  insulin aspart (NOVOLOG) 100 UNIT/ML injection SLIDING SCALE 3 TIMES DAILY WITH MEALS: For blood sugar 70-120-no insulin. Blood sugar 121-150 take 5 units. Blood sugar 151-208 take 8 units. Blood sugar 201-250 take 12 units. Blood sugar 251-300 take 17 units. Blood sugar 301-350 take 20 units. Blood sugar 351-400 take 25 units. Blood sugar greater than 400 take  28 units and call your doctor. 03/27/16   Devoria Albe, MD  insulin glargine-yfgn (SEMGLEE, YFGN,) 100 UNIT/ML Pen Inject 23-90 Units into the skin See admin instructions. Inject 23 units before breakfast and 90 units at bedtime.    [provider]  nystatin (MYCOSTATIN/NYSTOP) powder Apply topically 2 (two) times daily. 02/16/23   Shon Hale, MD  nystatin-triamcinolone (MYCOLOG II) cream Apply topically 2 (two) times daily. 02/16/23   Shon Hale, MD  ondansetron (ZOFRAN-ODT) 8 MG disintegrating tablet Take 8 mg by mouth every 8 (eight) hours as needed for nausea or vomiting.    [provider]  VICTOZA 18 MG/3ML SOPN Inject 1.8 mg into the skin daily. 07/11/19   [provider]      Allergies    Paxil [paroxetine hcl], Morphine and codeine, Latex, Lipitor [atorvastatin], and Sulfa antibiotics    Review of Systems   Review of Systems  Constitutional:  Positive for chills, diaphoresis and fever.  Gastrointestinal:  Positive for nausea.  Musculoskeletal:  Positive for myalgias.  Skin:  Positive for color change.    Physical Exam Updated Vital Signs BP 138/84   Pulse (!) 107   Temp 99.2 F (37.3 C) (Oral)   Resp (!) 26   Ht 5\' 11"  (1.803 m)   Wt (!) 259.9 kg   SpO2 97%   BMI 79.92 kg/m  Physical Exam Vitals and nursing note reviewed.  Constitutional:      Appearance: She is well-developed.  HENT:     Head: Normocephalic and  atraumatic.  Eyes:     Conjunctiva/sclera: Conjunctivae normal.  Cardiovascular:     Rate and Rhythm: Normal rate and regular rhythm.     Heart sounds: Normal heart sounds.  Pulmonary:     Effort: Pulmonary effort is normal.     Breath sounds: Normal breath sounds. No wheezing.  Abdominal:     General: Bowel sounds are normal.     Palpations: Abdomen is soft.     Tenderness: There is no abdominal tenderness.  Musculoskeletal:        General: Normal range of motion.     Cervical back: Normal range of motion.  Skin:     General: Skin is warm and dry.     Findings: Erythema present.          Comments: Moderate erythema and increased warmth at her right anterior and medial thigh.  Lesser involvement of her medial right calf.  Neurological:     Mental Status: She is alert.     ED Results / Procedures / Treatments   Labs (all labs ordered are listed, but only abnormal results are displayed) Labs Reviewed  CBC WITH DIFFERENTIAL/PLATELET - Abnormal; Notable for the following components:      Result Value   WBC 12.7 (*)    Platelets 145 (*)    Neutro Abs 11.5 (*)    Lymphs Abs 0.6 (*)    All other components within normal limits  APTT - Abnormal; Notable for the following components:   aPTT <22 (*)    All other components within normal limits  COMPREHENSIVE METABOLIC PANEL - Abnormal; Notable for the following components:   Sodium 133 (*)    Glucose, Bld 250 (*)    Calcium 8.6 (*)    Albumin 3.3 (*)    All other components within normal limits  CBG MONITORING, ED - Abnormal; Notable for the following components:   Glucose-Capillary 211 (*)    All other components within normal limits  RESP PANEL BY RT-PCR (RSV, FLU A&B, COVID)  RVPGX2  CULTURE, BLOOD (ROUTINE X 2)  CULTURE, BLOOD (ROUTINE X 2)  LACTIC ACID, PLASMA  LACTIC ACID, PLASMA  PROTIME-INR  URINALYSIS, W/ REFLEX TO CULTURE (INFECTION SUSPECTED)  POC URINE PREG, ED    EKG None  Radiology DG Chest Portable 1 View  Result Date: 06/09/2023 CLINICAL DATA:  Cough, fever. EXAM: PORTABLE CHEST 1 VIEW COMPARISON:  January 16, 2021. FINDINGS: The heart size and mediastinal contours are within normal limits. Both lungs are clear. The visualized skeletal structures are unremarkable. IMPRESSION: No active disease. Electronically Signed   By: Lupita Raider M.D.   On: 06/09/2023 21:00    Procedures Procedures    Medications Ordered in ED Medications  lactated ringers infusion (has no administration in time range)  lactated ringers bolus  1,000 mL (0 mLs Intravenous Stopped 06/09/23 2242)    And  lactated ringers bolus 1,000 mL (1,000 mLs Intravenous New Bag/Given 06/09/23 2231)    And  lactated ringers bolus 200 mL (has no administration in time range)  cefTRIAXone (ROCEPHIN) 2 g in sodium chloride 0.9 % 100 mL IVPB (0 g Intravenous Stopped 06/09/23 2141)  acetaminophen (TYLENOL) tablet 650 mg (650 mg Oral Given 06/09/23 2050)  ondansetron (ZOFRAN) injection 4 mg (4 mg Intravenous Given 06/09/23 2135)  ketorolac (TORADOL) 30 MG/ML injection 15 mg (15 mg Intravenous Given 06/09/23 2135)    ED Course/ Medical Decision Making/ A&P  Medical Decision Making Patient presenting with a 2-day history of increasing pain and redness of her right lower leg along with fever, diaphoresis and chills consistent with cellulitis.  She has a history of sepsis secondary to cellulitis although today's labs so far do not reflect this condition as her initial lactic acid is 1.7.  She does have a leukocytosis with a WBC count of 12.7.  Her glucose is 250, no anion gap and a normal CO2.  Also has complaints of cough, her chest x-ray and a respiratory panel are unremarkable.  Given patient's past history, leukocytosis, diabetes status and fever we will plan to admit for additional IV antibiotics, she was given an IV dose of Rocephin.  Amount and/or Complexity of Data Reviewed Labs: ordered.    Details: Per above. Radiology: ordered.    Details: Chest x-ray reviewed and I agree with interpretation, no pneumonia. ECG/medicine tests: ordered.  Risk Decision regarding hospitalization. Risk Details: Call placed to the hospitalist for admission.  Spoke with Dr. Thomes Dinning who agrees with admission.           Final Clinical Impression(s) / ED Diagnoses Final diagnoses:  Cellulitis of right lower extremity    Rx / DC Orders ED Discharge Orders     None         Victoriano Lain 06/09/23 2315     Loetta Rough, MD 06/10/23 [phone]

## 2023-06-09 NOTE — Progress Notes (Signed)
CODE SEPSIS - PHARMACY COMMUNICATION  **Broad Spectrum Antibiotics should be administered within 1 hour of Sepsis diagnosis**  Time Code Sepsis Called/Page Received: 20:16  Antibiotics Ordered: Ceftriaxone  Time of 1st antibiotic administration: 20:49  Additional action taken by pharmacy: n/a  If necessary, Name of Provider/Nurse Contacted: n/a    Foye Deer ,PharmD Clinical Pharmacist  06/09/2023  8:52 PM

## 2023-06-09 NOTE — H&P (Signed)
History and Physical    Patient: Brittany [name]:[id number] DOB: Jan 26, 1981 DOA: 06/09/2023 DOS: the patient was seen and examined on 06/10/2023 PCP: Lianne Moris, PA-C  Patient coming from: Home  Chief Complaint:  Chief Complaint  Patient presents with   Leg Pain   HPI: Brittany [name] is a 42 y.o. female with medical history significant of lymphedema, type 2 diabetes mellitus who presents to the emergency department due to right leg pain and redness which started yesterday and this was associated with fever and chills.  Tmax of 101F was obtained yesterday in the evening.  The rash which started in right middle thigh has now extended to the right calf.  She denies chest pain, shortness of breath, nausea and vomiting.  Fever was alleviated with Tylenol.  Patient was admitted from 8/5 to 8/8 due to left leg pain and lymphedema.  She was going to be referred to lymphedema clinic at that time, but patient refused.  ED Course:  In the emergency department, she was febrile with a temperature of 101.7, tachycardic and BP was 162/88.  Workup in the ED showed normal CBC except for leukocytosis, normal BMP except for sodium 133 and CBG 250, albumin 3.3.  Lactic acid was normal.  Nancyi, B, SARS coronavirus 2, RSV was normal.  Blood culture pending Chest x-ray showed no active disease Patient was treated with Tylenol and Toradol for pain, Zofran was given for nausea, IV hydration was provided.  She was treated with IV ceftriaxone.  Hospitalist was asked to admit patient for further evaluation and management.  Review of Systems: Review of systems as noted in the HPI. All other systems reviewed and are negative.   Past Medical History:  Diagnosis Date   Anxiety    Carpal tunnel syndrome on left 10/12/2016   Cellulitis and abscess of leg    Chronic abdominal pain    Depression    Diabetes mellitus without complication (HCC)    Gallstones    Gastroparesis    GERD (gastroesophageal reflux  disease) 08/13/2017   Headache    Hypothyroid 01/03/2018   Lymphedema    Sleep apnea    DX with sleep apnea - unable to use c-pap   Umbilical hernia    Past Surgical History:  Procedure Laterality Date   BIOPSY  11/11/2019   Procedure: BIOPSY;  Surgeon: Tressia Danas, MD;  Location: [facility];  Service: Gastroenterology;;   CESAREAN SECTION  11/09/01   ESOPHAGOGASTRODUODENOSCOPY (EGD) WITH PROPOFOL N/A 11/11/2019   Procedure: ESOPHAGOGASTRODUODENOSCOPY (EGD) WITH PROPOFOL;  Surgeon: Tressia Danas, MD;  Location: [facility];  Service: Gastroenterology;  Laterality: N/A;   HERNIA REPAIR     INSERTION OF MESH N/A 01/02/2013   Procedure: INSERTION OF MESH;  Surgeon: Lodema Pilot, DO;  Location: [facility];  Service: General;  Laterality: N/A;   UMBILICAL HERNIA REPAIR N/A 01/02/2013   Procedure: LAPAROSCOPIC UMBILICAL HERNIA;  Surgeon: Lodema Pilot, DO;  Location: [facility];  Service: General;  Laterality: N/A;    Social History:  reports that she has never smoked. She has never used smokeless tobacco. She reports that she does not drink alcohol and does not use drugs.   Allergies  Allergen Reactions   Paxil [Paroxetine Hcl] Other (See Comments)    Suicidal thoughts   Morphine And Codeine Other (See Comments)    "it just freaks me out"   Latex Itching, Swelling and Rash   Lipitor [Atorvastatin] Rash   Sulfa Antibiotics Nausea And Vomiting and Rash  Family History  Problem Relation Age of Onset   Cancer Mother        leukemia   Diabetes Mother    Hypertension Mother    Hyperlipidemia Mother    Other Father        Sciatica, kidney stones   Alcohol abuse Paternal Grandfather    Other Paternal Grandmother        bowel obstruction; pneumonia   Hypertension Maternal Grandmother    Diabetes Maternal Grandmother    Cancer Maternal Grandfather        lung   ADD / ADHD Daughter    Depression Daughter    Anxiety disorder Daughter      Prior to Admission medications    Medication Sig Start Date End Date Taking? Authorizing Provider  acetaminophen (TYLENOL) 500 MG tablet Take 1,000 mg by mouth every 8 (eight) hours as needed for moderate pain, headache or fever.    [provider]  gabapentin (NEURONTIN) 600 MG tablet Take 1 tablet (600 mg total) by mouth 3 (three) times daily. 02/16/23   Shon Hale, MD  HYDROcodone-acetaminophen (NORCO) 7.5-325 MG tablet Take 1 tablet by mouth every 8 (eight) hours as needed for moderate pain. 02/16/23   Emokpae, Courage, MD  insulin aspart (NOVOLOG) 100 UNIT/ML injection SLIDING SCALE 3 TIMES DAILY WITH MEALS: For blood sugar 70-120-no insulin. Blood sugar 121-150 take 5 units. Blood sugar 151-208 take 8 units. Blood sugar 201-250 take 12 units. Blood sugar 251-300 take 17 units. Blood sugar 301-350 take 20 units. Blood sugar 351-400 take 25 units. Blood sugar greater than 400 take 28 units and call your doctor. 03/27/16   Devoria Albe, MD  insulin glargine-yfgn (SEMGLEE, YFGN,) 100 UNIT/ML Pen Inject 23-90 Units into the skin See admin instructions. Inject 23 units before breakfast and 90 units at bedtime.    [provider]  nystatin (MYCOSTATIN/NYSTOP) powder Apply topically 2 (two) times daily. 02/16/23   Shon Hale, MD  nystatin-triamcinolone (MYCOLOG II) cream Apply topically 2 (two) times daily. 02/16/23   Shon Hale, MD  ondansetron (ZOFRAN-ODT) 8 MG disintegrating tablet Take 8 mg by mouth every 8 (eight) hours as needed for nausea or vomiting.    [provider]  VICTOZA 18 MG/3ML SOPN Inject 1.8 mg into the skin daily. 07/11/19   [provider]    Physical Exam: BP (!) 149/66 (BP Location: Left Arm)   Pulse (!) 104   Temp (!) 101.1 F (38.4 C) (Oral)   Resp 18   Ht 5\' 11"  (1.803 m)   Wt (!) 272.2 kg   SpO2 100%   BMI 83.68 kg/m   General: 42 y.o. year-old female well developed well nourished in no acute distress.  Alert and oriented x3. HEENT: NCAT, EOMI Neck:  Supple, trachea medial Cardiovascular: Regular rate and rhythm with no rubs or gallops.  No thyromegaly or JVD noted.  No lower extremity edema. 2/4 pulses in all 4 extremities. Respiratory: Clear to auscultation with no wheezes or rales. Good inspiratory effort. Abdomen: Soft, nontender nondistended with normal bowel sounds x4 quadrants. Muskuloskeletal: Erythema, warmth and tenderness to palpation of right medial thigh.  No cyanosis, clubbing or edema noted bilaterally Neuro: CN II-XII intact, strength 5/5 x 4, sensation, reflexes intact Skin: No ulcerative lesions noted or rashes Psychiatry: Judgement and insight appear normal. Mood is appropriate for condition and setting          Labs on Admission:  Basic Metabolic Panel: Recent Labs  Lab 06/09/23 2144  NA 133*  K 4.3  CL 100  CO2 22  GLUCOSE 250*  BUN 12  CREATININE 1.00  CALCIUM 8.6*   Liver Function Tests: Recent Labs  Lab 06/09/23 2144  AST 19  ALT 21  ALKPHOS 72  BILITOT 1.0  PROT 7.8  ALBUMIN 3.3*   No results for input(s): "LIPASE", "AMYLASE" in the last 168 hours. No results for input(s): "AMMONIA" in the last 168 hours. CBC: Recent Labs  Lab 06/09/23 2027  WBC 12.7*  NEUTROABS 11.5*  HGB 13.0  HCT 41.9  MCV 89.1  PLT 145*   Cardiac Enzymes: No results for input(s): "CKTOTAL", "CKMB", "CKMBINDEX", "TROPONINI" in the last 168 hours.  BNP (last 3 results) No results for input(s): "BNP" in the last 8760 hours.  ProBNP (last 3 results) No results for input(s): "PROBNP" in the last 8760 hours.  CBG: Recent Labs  Lab 06/09/23 2052 06/10/23 0054  GLUCAP 211* 211*    Radiological Exams on Admission: DG Chest Portable 1 View  Result Date: 06/09/2023 CLINICAL DATA:  Cough, fever. EXAM: PORTABLE CHEST 1 VIEW COMPARISON:  January 16, 2021. FINDINGS: The heart size and mediastinal contours are within normal limits. Both lungs are clear. The visualized skeletal structures are unremarkable. IMPRESSION:  No active disease. Electronically Signed   By: Lupita Raider M.D.   On: 06/09/2023 21:00    EKG: I independently viewed the EKG done and my findings are as followed: Sinus tachycardia at a rate of 107 bpm  Assessment/Plan Present on Admission:  Sepsis due to cellulitis [facility])  Principal Problem:   Sepsis due to cellulitis [facility]) Active Problems:   Morbid obesity with BMI of 70 and over, adult [facility])   Uncontrolled type 2 diabetes mellitus with hyperglycemia, with long-term current use of insulin (HCC)   Lymphedema   Hypoalbuminemia due to protein-calorie malnutrition (HCC)   Sepsis due to right lower extremity cellulitis Patient met SIRS criteria due to tachycardia, leukocytosis, fever, tachypnea.  Source of infection is cellulitis of right medial thigh. Patient was started on IV ceftriaxone, we shall continue same at this time Blood culture pending.  Bilateral lower extremity lymphedema Consider referring patient to lymphedema clinic  Hypoalbuminemia possibly secondary to mild protein calorie malnutrition Albumin 3.3, protein supplement will be provided  Type 2 diabetes mellitus with hyperglycemia Hemoglobin A1c done on 02/13/2023 was 8.6 Continue Semglee 15 units twice daily and adjust dose accordingly (patient takes 23 units of Semglee in the morning and 90 units at night)  Morbid obesity (BMI 83.68) Diet and lifestyle modification Dietitian will be consulted  DVT prophylaxis: Lovenox  Family Communication: Husband at bedside   Advance Care Planning:   Code Status: Full Code   Consults: Dietitian  Severity of Illness: The appropriate patient status for this patient is INPATIENT. Inpatient status is judged to be reasonable and necessary in order to provide the required intensity of service to ensure the patient's safety. The patient's presenting symptoms, physical exam findings, and initial radiographic and laboratory data in the context of their chronic comorbidities is  felt to place them at high risk for further clinical deterioration. Furthermore, it is not anticipated that the patient will be medically stable for discharge from the hospital within 2 midnights of admission.   * I certify that at the point of admission it is my clinical judgment that the patient will require inpatient hospital care spanning beyond 2 midnights from the point of admission due to high intensity of service, high risk for  further deterioration and high frequency of surveillance required.*  Author: Frankey Shown, DO 06/10/2023 4:08 AM  For on call review [email or web address].

## 2023-06-09 NOTE — ED Notes (Signed)
ED TO INPATIENT HANDOFF REPORT  ED Nurse Name and Phone #: Lenox Ponds Name/Age/Gender [name] 42 y.o. female Room/Bed: APA09/APA09  Code Status   Code Status: Prior  Home/SNF/Other Home Patient oriented to: self, place, time, and situation Is this baseline? Yes   Triage Complete: Triage complete  Chief Complaint Sepsis due to cellulitis (HCC) [L03.90, A41.9]  Triage Note Pt reports she has "masses" in her legs and she is concerned the right one is turning into cellulitis because it is red.  Pt also has a cough and had a fever yesterday but the fever is gone today.  Pt is only concerned with her "masses".   Allergies Allergies  Allergen Reactions   Paxil [Paroxetine Hcl] Other (See Comments)    Suicidal thoughts   Morphine And Codeine Other (See Comments)    "it just freaks me out"   Latex Itching, Swelling and Rash   Lipitor [Atorvastatin] Rash   Sulfa Antibiotics Nausea And Vomiting and Rash    Level of Care/Admitting Diagnosis ED Disposition     ED Disposition  Admit   Condition  --   Comment  Hospital Area: [facility] [[id number]]  Level of Care: Med-Surg [16]  Covid Evaluation: Asymptomatic - no recent exposure (last 10 days) testing not required  Diagnosis: Sepsis due to cellulitis [facility]) [[id number]]  Admitting Physician: Frankey Shown [[id number]]  Attending Physician: Frankey Shown [[id number]]  Certification:: I certify this patient will need inpatient services for at least 2 midnights  Expected Medical Readiness: 06/12/2023          B Medical/Surgery History Past Medical History:  Diagnosis Date   Anxiety    Carpal tunnel syndrome on left 10/12/2016   Cellulitis and abscess of leg    Chronic abdominal pain    Depression    Diabetes mellitus without complication (HCC)    Gallstones    Gastroparesis    GERD (gastroesophageal reflux disease) 08/13/2017   Headache    Hypothyroid 01/03/2018   Lymphedema    Sleep apnea    DX with  sleep apnea - unable to use c-pap   Umbilical hernia    Past Surgical History:  Procedure Laterality Date   BIOPSY  11/11/2019   Procedure: BIOPSY;  Surgeon: Tressia Danas, MD;  Location: [facility];  Service: Gastroenterology;;   CESAREAN SECTION  11/09/01   ESOPHAGOGASTRODUODENOSCOPY (EGD) WITH PROPOFOL N/A 11/11/2019   Procedure: ESOPHAGOGASTRODUODENOSCOPY (EGD) WITH PROPOFOL;  Surgeon: Tressia Danas, MD;  Location: [facility];  Service: Gastroenterology;  Laterality: N/A;   HERNIA REPAIR     INSERTION OF MESH N/A 01/02/2013   Procedure: INSERTION OF MESH;  Surgeon: Lodema Pilot, DO;  Location: [facility];  Service: General;  Laterality: N/A;   UMBILICAL HERNIA REPAIR N/A 01/02/2013   Procedure: LAPAROSCOPIC UMBILICAL HERNIA;  Surgeon: Lodema Pilot, DO;  Location: [facility];  Service: General;  Laterality: N/A;     A IV Location/Drains/Wounds Patient Lines/Drains/Airways Status     Active Line/Drains/Airways     Name Placement date Placement time Site Days   Peripheral IV 06/09/23 20 G Right Antecubital 06/09/23  2027  Antecubital  less than 1            Intake/Output Last 24 hours  Intake/Output Summary (Last 24 hours) at 06/09/2023 2330 Last data filed at 06/09/2023 2242 Gross per 24 hour  Intake 1100 ml  Output --  Net 1100 ml    Labs/Imaging Results for orders placed or performed during the hospital  encounter of 06/09/23 (from the past 48 hour(s))  Lactic acid, plasma     Status: None   Collection Time: 06/09/23  8:27 PM  Result Value Ref Range   Lactic Acid, Venous 1.4 0.5 - 1.9 mmol/L    Comment: Performed at [facility] [address], [place], [place] [address]  CBC with Differential     Status: Abnormal   Collection Time: 06/09/23  8:27 PM  Result Value Ref Range   WBC 12.7 (H) 4.0 - 10.5 K/uL   RBC 4.70 3.87 - 5.11 MIL/uL   Hemoglobin 13.0 12.0 - 15.0 g/dL   HCT 27.2 53.6 - 64.4 %   MCV 89.1 80.0 - 100.0 fL   MCH 27.7 26.0 - 34.0 pg   MCHC 31.0  30.0 - 36.0 g/dL   RDW 03.4 74.2 - 59.5 %   Platelets 145 (L) 150 - 400 K/uL   nRBC 0.0 0.0 - 0.2 %   Neutrophils Relative % 91 %   Neutro Abs 11.5 (H) 1.7 - 7.7 K/uL   Lymphocytes Relative 5 %   Lymphs Abs 0.6 (L) 0.7 - 4.0 K/uL   Monocytes Relative 4 %   Monocytes Absolute 0.5 0.1 - 1.0 K/uL   Eosinophils Relative 0 %   Eosinophils Absolute 0.0 0.0 - 0.5 K/uL   Basophils Relative 0 %   Basophils Absolute 0.0 0.0 - 0.1 K/uL   Immature Granulocytes 0 %   Abs Immature Granulocytes 0.05 0.00 - 0.07 K/uL    Comment: Performed at [facility], [address], [place], [place] [address]  Resp panel by RT-PCR (RSV, Flu A&B, Covid) Anterior Nasal Swab     Status: None   Collection Time: 06/09/23  8:27 PM   Specimen: Anterior Nasal Swab  Result Value Ref Range   SARS Coronavirus 2 by RT PCR NEGATIVE NEGATIVE    Comment: (NOTE) SARS-CoV-2 target nucleic acids are NOT DETECTED.  The SARS-CoV-2 RNA is generally detectable in upper respiratory specimens during the acute phase of infection. The lowest concentration of SARS-CoV-2 viral copies this assay can detect is 138 copies/mL. A negative result does not preclude SARS-Cov-2 infection and should not be used as the sole basis for treatment or other patient management decisions. A negative result may occur with  improper specimen collection/handling, submission of specimen other than nasopharyngeal swab, presence of viral mutation(s) within the areas targeted by this assay, and inadequate number of viral copies(<138 copies/mL). A negative result must be combined with clinical observations, patient history, and epidemiological information. The expected result is Negative.  Fact Sheet for Patients:  BloggerCourse.com  Fact Sheet for Healthcare Providers:  SeriousBroker.it  This test is no t yet approved or cleared by the [place] FDA and  has been authorized for detection and/or  diagnosis of SARS-CoV-2 by FDA under an Emergency Use Authorization (EUA). This EUA will remain  in effect (meaning this test can be used) for the duration of the COVID-19 declaration under Section 564(b)(1) of the Act, 21 U.S.C.section 360bbb-3(b)(1), unless the authorization is terminated  or revoked sooner.       Influenza A by PCR NEGATIVE NEGATIVE   Influenza B by PCR NEGATIVE NEGATIVE    Comment: (NOTE) The Xpert Xpress SARS-CoV-2/FLU/RSV plus assay is intended as an aid in the diagnosis of influenza from Nasopharyngeal swab specimens and should not be used as a sole basis for treatment. Nasal washings and aspirates are unacceptable for Xpert Xpress SARS-CoV-2/FLU/RSV testing.  Fact Sheet for Patients: BloggerCourse.com  Fact Sheet for Healthcare Providers: SeriousBroker.it  This test is not yet approved or cleared by the [place] FDA and has been authorized for detection and/or diagnosis of SARS-CoV-2 by FDA under an Emergency Use Authorization (EUA). This EUA will remain in effect (meaning this test can be used) for the duration of the COVID-19 declaration under Section 564(b)(1) of the Act, 21 U.S.C. section 360bbb-3(b)(1), unless the authorization is terminated or revoked.     Resp Syncytial Virus by PCR NEGATIVE NEGATIVE    Comment: (NOTE) Fact Sheet for Patients: BloggerCourse.com  Fact Sheet for Healthcare Providers: SeriousBroker.it  This test is not yet approved or cleared by the [place] FDA and has been authorized for detection and/or diagnosis of SARS-CoV-2 by FDA under an Emergency Use Authorization (EUA). This EUA will remain in effect (meaning this test can be used) for the duration of the COVID-19 declaration under Section 564(b)(1) of the Act, 21 U.S.C. section 360bbb-3(b)(1), unless the authorization is terminated or revoked.  Performed  at [facility], [address] [place], [place] [address]   Protime-INR     Status: None   Collection Time: 06/09/23  8:27 PM  Result Value Ref Range   Prothrombin Time 14.6 11.4 - 15.2 seconds   INR 1.1 0.8 - 1.2    Comment: (NOTE) INR goal varies based on device and disease states. Performed at [facility], [address], [place], [place] [address]   APTT     Status: Abnormal   Collection Time: 06/09/23  8:27 PM  Result Value Ref Range   aPTT <22 (L) 24 - 36 seconds    Comment: Performed at [facility], [address] [place], [place] [address]  Blood Culture (routine x 2)     Status: None (Preliminary result)   Collection Time: 06/09/23  8:27 PM   Specimen: Left Antecubital; Blood  Result Value Ref Range   Specimen Description LEFT ANTECUBITAL    Special Requests      BOTTLES DRAWN AEROBIC AND ANAEROBIC Blood Culture adequate volume Performed at [facility], [address] [place], [place] [address]    Culture PENDING    Report Status PENDING   Blood Culture (routine x 2)     Status: None (Preliminary result)   Collection Time: 06/09/23  8:37 PM   Specimen: Left Antecubital; Blood  Result Value Ref Range   Specimen Description LEFT ANTECUBITAL    Special Requests      BOTTLES DRAWN AEROBIC AND ANAEROBIC Blood Culture adequate volume Performed at [facility], [address], [place], [place] [address]    Culture PENDING    Report Status PENDING   POC CBG, ED     Status: Abnormal   Collection Time: 06/09/23  8:52 PM  Result Value Ref Range   Glucose-Capillary 211 (H) 70 - 99 mg/dL    Comment: Glucose reference range applies only to samples taken after fasting for at least 8 hours.  Lactic acid, plasma     Status: None   Collection Time: 06/09/23  9:44 PM  Result Value Ref Range   Lactic Acid, Venous 1.7 0.5 - 1.9 mmol/L    Comment: Performed at [facility], [address] [place], [place] [address]  Comprehensive metabolic panel     Status: Abnormal   Collection  Time: 06/09/23  9:44 PM  Result Value Ref Range   Sodium 133 (L) 135 - 145 mmol/L   Potassium 4.3 3.5 - 5.1 mmol/L   Chloride 100 98 -  111 mmol/L   CO2 22 22 - 32 mmol/L   Glucose, Bld 250 (H) 70 - 99 mg/dL    Comment: Glucose reference range applies only to samples taken after fasting for at least 8 hours.   BUN 12 6 - 20 mg/dL   Creatinine, Ser 1.61 0.44 - 1.00 mg/dL   Calcium 8.6 (L) 8.9 - 10.3 mg/dL   Total Protein 7.8 6.5 - 8.1 g/dL   Albumin 3.3 (L) 3.5 - 5.0 g/dL   AST 19 15 - 41 U/L   ALT 21 0 - 44 U/L   Alkaline Phosphatase 72 38 - 126 U/L   Total Bilirubin 1.0 <1.2 mg/dL   GFR, Estimated >09 >60 mL/min    Comment: (NOTE) Calculated using the CKD-EPI Creatinine Equation (2021)    Anion gap 11 5 - 15    Comment: Performed at [facility], [address], [place], [place] [address]  POC urine preg, ED     Status: None   Collection Time: 06/09/23 10:56 PM  Result Value Ref Range   Preg Test, Ur Negative Negative  Urinalysis, w/ Reflex to Culture (Infection Suspected) -Urine, Clean Catch     Status: Abnormal   Collection Time: 06/09/23 10:57 PM  Result Value Ref Range   Specimen Source URINE, CLEAN CATCH    Color, Urine YELLOW YELLOW   APPearance CLEAR CLEAR   Specific Gravity, Urine 1.023 1.005 - 1.030   pH 6.0 5.0 - 8.0   Glucose, UA 50 (A) NEGATIVE mg/dL   Hgb urine dipstick SMALL (A) NEGATIVE   Bilirubin Urine NEGATIVE NEGATIVE   Ketones, ur 5 (A) NEGATIVE mg/dL   Protein, ur 811 (A) NEGATIVE mg/dL   Nitrite NEGATIVE NEGATIVE   Leukocytes,Ua NEGATIVE NEGATIVE   RBC / HPF 0-5 0 - 5 RBC/hpf   WBC, UA 0-5 0 - 5 WBC/hpf    Comment:        Reflex urine culture not performed if WBC <=10, OR if Squamous epithelial cells >5. If Squamous epithelial cells >5 suggest recollection.    Bacteria, UA NONE SEEN NONE SEEN   Squamous Epithelial / HPF 0-5 0 - 5 /HPF    Comment: Performed at [facility], [address], [place], [place] [address]   DG Chest Portable 1  View  Result Date: 06/09/2023 CLINICAL DATA:  Cough, fever. EXAM: PORTABLE CHEST 1 VIEW COMPARISON:  January 16, 2021. FINDINGS: The heart size and mediastinal contours are within normal limits. Both lungs are clear. The visualized skeletal structures are unremarkable. IMPRESSION: No active disease. Electronically Signed   By: Lupita Raider M.D.   On: 06/09/2023 21:00    Pending Labs Unresulted Labs (From admission, onward)    None       Vitals/Pain Today's Vitals   06/09/23 2138 06/09/23 2139 06/09/23 2140 06/09/23 2230  BP:    138/84  Pulse:  (!) 105 100 (!) 107  Resp:  20 12 (!) 26  Temp: (!) 100.5 F (38.1 C)   99.2 F (37.3 C)  TempSrc: Oral   Oral  SpO2:  97% 97% 97%  Weight:      Height:      PainSc:        Isolation Precautions No active isolations  Medications Medications  lactated ringers infusion (has no administration in time range)  lactated ringers bolus 1,000 mL (0 mLs Intravenous Stopped 06/09/23 2242)    And  lactated ringers bolus 1,000 mL (1,000 mLs Intravenous New Bag/Given 06/09/23 2231)  And  lactated ringers bolus 200 mL (has no administration in time range)  cefTRIAXone (ROCEPHIN) 2 g in sodium chloride 0.9 % 100 mL IVPB (0 g Intravenous Stopped 06/09/23 2141)  acetaminophen (TYLENOL) tablet 650 mg (650 mg Oral Given 06/09/23 2050)  ondansetron (ZOFRAN) injection 4 mg (4 mg Intravenous Given 06/09/23 2135)  ketorolac (TORADOL) 30 MG/ML injection 15 mg (15 mg Intravenous Given 06/09/23 2135)    Mobility non-ambulatory   Able to stand with assist      R Recommendations: See Admitting Provider Note  Report given to:   Additional Notes:

## 2023-06-10 DIAGNOSIS — L0390 Cellulitis, unspecified: Secondary | ICD-10-CM | POA: Diagnosis not present

## 2023-06-10 DIAGNOSIS — A419 Sepsis, unspecified organism: Secondary | ICD-10-CM | POA: Diagnosis not present

## 2023-06-10 DIAGNOSIS — E8809 Other disorders of plasma-protein metabolism, not elsewhere classified: Secondary | ICD-10-CM | POA: Insufficient documentation

## 2023-06-10 LAB — BLOOD CULTURE ID PANEL (REFLEXED) - BCID2

## 2023-06-10 LAB — GLUCOSE, CAPILLARY
Glucose-Capillary: 211 mg/dL — ABNORMAL HIGH (ref 70–99)
Glucose-Capillary: 225 mg/dL — ABNORMAL HIGH (ref 70–99)
Glucose-Capillary: 236 mg/dL — ABNORMAL HIGH (ref 70–99)
Glucose-Capillary: 247 mg/dL — ABNORMAL HIGH (ref 70–99)
Glucose-Capillary: 254 mg/dL — ABNORMAL HIGH (ref 70–99)

## 2023-06-10 LAB — CBC
HCT: 35.5 % — ABNORMAL LOW (ref 36.0–46.0)
Hemoglobin: 11.2 g/dL — ABNORMAL LOW (ref 12.0–15.0)
MCH: 28.1 pg (ref 26.0–34.0)
MCHC: 31.5 g/dL (ref 30.0–36.0)
MCV: 89.2 fL (ref 80.0–100.0)
Platelets: 143 10*3/uL — ABNORMAL LOW (ref 150–400)
RBC: 3.98 MIL/uL (ref 3.87–5.11)
RDW: 15.1 % (ref 11.5–15.5)
WBC: 11.4 10*3/uL — ABNORMAL HIGH (ref 4.0–10.5)
nRBC: 0 % (ref 0.0–0.2)

## 2023-06-10 LAB — COMPREHENSIVE METABOLIC PANEL
ALT: 18 U/L (ref 0–44)
AST: 15 U/L (ref 15–41)
Albumin: 3 g/dL — ABNORMAL LOW (ref 3.5–5.0)
Alkaline Phosphatase: 63 U/L (ref 38–126)
Anion gap: 11 (ref 5–15)
BUN: 13 mg/dL (ref 6–20)
CO2: 20 mmol/L — ABNORMAL LOW (ref 22–32)
Calcium: 8.1 mg/dL — ABNORMAL LOW (ref 8.9–10.3)
Chloride: 102 mmol/L (ref 98–111)
Creatinine, Ser: 1.07 mg/dL — ABNORMAL HIGH (ref 0.44–1.00)
GFR, Estimated: >60 mL/min (ref >60)
Glucose, Bld: 253 mg/dL — ABNORMAL HIGH (ref 70–99)
Potassium: 3.8 mmol/L (ref 3.5–5.1)
Sodium: 133 mmol/L — ABNORMAL LOW (ref 135–145)
Total Bilirubin: 1.1 mg/dL (ref <1.2)
Total Protein: 7 g/dL (ref 6.5–8.1)

## 2023-06-10 LAB — PHOSPHORUS: Phosphorus: 2.1 mg/dL — ABNORMAL LOW (ref 2.5–4.6)

## 2023-06-10 LAB — MAGNESIUM: Magnesium: 1.6 mg/dL — ABNORMAL LOW (ref 1.7–2.4)

## 2023-06-10 MED ORDER — GLUCERNA SHAKE PO LIQD
237.0000 mL | Freq: Three times a day (TID) | ORAL | Status: DC
  Administered 2023-06-10 – 2023-06-13 (×6): 237 mL via ORAL

## 2023-06-10 MED ORDER — ONDANSETRON HCL 4 MG/2ML IJ SOLN
4.0000 mg | Freq: Four times a day (QID) | INTRAMUSCULAR | Status: DC | PRN
  Administered 2023-06-10 – 2023-06-13 (×9): 4 mg via INTRAVENOUS
  Filled 2023-06-10 (×10): qty 2

## 2023-06-10 MED ORDER — INSULIN GLARGINE-YFGN 100 UNIT/ML ~~LOC~~ SOLN
15.0000 [IU] | Freq: Two times a day (BID) | SUBCUTANEOUS | Status: DC
  Administered 2023-06-10 – 2023-06-15 (×11): 15 [IU] via SUBCUTANEOUS
  Filled 2023-06-10 (×14): qty 0.15

## 2023-06-10 MED ORDER — VANCOMYCIN HCL IN DEXTROSE 1-5 GM/200ML-% IV SOLN
1000.0000 mg | Freq: Two times a day (BID) | INTRAVENOUS | Status: DC
  Administered 2023-06-11: 1000 mg via INTRAVENOUS
  Filled 2023-06-10: qty 200

## 2023-06-10 MED ORDER — ENOXAPARIN SODIUM 80 MG/0.8ML IJ SOSY
80.0000 mg | PREFILLED_SYRINGE | Freq: Once | INTRAMUSCULAR | Status: AC
  Administered 2023-06-10: 80 mg via SUBCUTANEOUS
  Filled 2023-06-10: qty 0.8

## 2023-06-10 MED ORDER — ONDANSETRON HCL 4 MG PO TABS
4.0000 mg | ORAL_TABLET | Freq: Four times a day (QID) | ORAL | Status: DC | PRN
Start: 2023-06-10 — End: 2023-06-13
  Administered 2023-06-13: 4 mg via ORAL
  Filled 2023-06-10: qty 1

## 2023-06-10 MED ORDER — INSULIN ASPART 100 UNIT/ML IJ SOLN
0.0000 [IU] | Freq: Every day | INTRAMUSCULAR | Status: DC
  Administered 2023-06-10: 2 [IU] via SUBCUTANEOUS
  Administered 2023-06-13: 3 [IU] via SUBCUTANEOUS

## 2023-06-10 MED ORDER — VANCOMYCIN HCL 1250 MG/250ML IV SOLN
1250.0000 mg | Freq: Once | INTRAVENOUS | Status: AC
  Administered 2023-06-10: 1250 mg via INTRAVENOUS

## 2023-06-10 MED ORDER — VANCOMYCIN HCL IN DEXTROSE 1-5 GM/200ML-% IV SOLN: 1000.0000 mg | Freq: Two times a day (BID) | INTRAVENOUS | Status: DC

## 2023-06-10 MED ORDER — VANCOMYCIN HCL 1250 MG/250ML IV SOLN
1250.0000 mg | Freq: Once | INTRAVENOUS | Status: AC
  Administered 2023-06-10: 1250 mg via INTRAVENOUS
  Filled 2023-06-10: qty 250

## 2023-06-10 MED ORDER — K PHOS MONO-SOD PHOS DI & MONO 155-852-130 MG PO TABS
500.0000 mg | ORAL_TABLET | Freq: Three times a day (TID) | ORAL | Status: DC
  Administered 2023-06-10 – 2023-06-15 (×14): 500 mg via ORAL
  Filled 2023-06-10 (×14): qty 2

## 2023-06-10 MED ORDER — ENOXAPARIN SODIUM 40 MG/0.4ML IJ SOSY
40.0000 mg | PREFILLED_SYRINGE | INTRAMUSCULAR | Status: DC
  Administered 2023-06-10: 40 mg via SUBCUTANEOUS
  Filled 2023-06-10: qty 0.4

## 2023-06-10 MED ORDER — PROCHLORPERAZINE EDISYLATE 10 MG/2ML IJ SOLN
10.0000 mg | Freq: Once | INTRAMUSCULAR | Status: AC
  Administered 2023-06-10: 10 mg via INTRAVENOUS
  Filled 2023-06-10: qty 2

## 2023-06-10 MED ORDER — ACETAMINOPHEN 325 MG PO TABS
650.0000 mg | ORAL_TABLET | Freq: Four times a day (QID) | ORAL | Status: DC | PRN
  Administered 2023-06-10 – 2023-06-11 (×3): 650 mg via ORAL
  Filled 2023-06-10 (×4): qty 2

## 2023-06-10 MED ORDER — ENOXAPARIN SODIUM 150 MG/ML IJ SOSY
0.5000 mg/kg | PREFILLED_SYRINGE | INTRAMUSCULAR | Status: DC
  Administered 2023-06-11 – 2023-06-14 (×4): 135 mg via SUBCUTANEOUS
  Filled 2023-06-10 (×5): qty 1

## 2023-06-10 MED ORDER — INSULIN ASPART 100 UNIT/ML IJ SOLN
0.0000 [IU] | Freq: Three times a day (TID) | INTRAMUSCULAR | Status: DC
  Administered 2023-06-10: 5 [IU] via SUBCUTANEOUS
  Administered 2023-06-10: 8 [IU] via SUBCUTANEOUS
  Administered 2023-06-10 – 2023-06-12 (×6): 5 [IU] via SUBCUTANEOUS
  Administered 2023-06-12: 3 [IU] via SUBCUTANEOUS
  Administered 2023-06-13: 5 [IU] via SUBCUTANEOUS
  Administered 2023-06-13 – 2023-06-15 (×7): 3 [IU] via SUBCUTANEOUS

## 2023-06-10 MED ORDER — ACETAMINOPHEN 650 MG RE SUPP: 650.0000 mg | Freq: Four times a day (QID) | RECTAL | Status: DC | PRN

## 2023-06-10 MED ORDER — MAGNESIUM SULFATE 2 GM/50ML IV SOLN
2.0000 g | Freq: Once | INTRAVENOUS | Status: AC
  Administered 2023-06-10: 2 g via INTRAVENOUS
  Filled 2023-06-10: qty 50

## 2023-06-10 NOTE — Consult Note (Signed)
Pharmacy Antibiotic Note  ASSESSMENT: 42 y.o. female with PMH including morbid obesity (BMI 84), lymphedema, T2DM is presenting with right leg pain, redness, fever, and chills. Patient was initially started on ceftriaxone for cellulitis but now 3 of 4 blood culture bottles are showing GPCs upon Gram stain. Both sets were collected from the same site. Pharmacy has been consulted to manage vancomycin dosing.  Patient measurements: Height: 5\' 11"  (180.3 cm) Weight: (!) 272.2 kg (600 lb) IBW/kg (Calculated) : 70.8  Vital signs: Temp: 99 F (37.2 C) (11/30 1251) Temp Source: Oral (11/30 0527) BP: 117/60 (11/30 1251) Pulse Rate: 96 (11/30 1251) Recent Labs  Lab 06/09/23 2027 06/09/23 2144 06/10/23 0449  WBC 12.7*  --  11.4*  CREATININE  --  1.00 1.07*   Estimated Creatinine Clearance: 160.4 mL/min (A) (by C-G formula based on SCr of 1.07 mg/dL (H)).  Allergies: Allergies  Allergen Reactions   Paxil [Paroxetine Hcl] Other (See Comments)    Suicidal thoughts   Morphine And Codeine Other (See Comments)    "it just freaks me out" Just morphine   Latex Itching, Swelling and Rash   Lipitor [Atorvastatin] Rash   Sulfa Antibiotics Nausea And Vomiting and Rash    Antimicrobials this admission: Ceftriaxone 11/29 >> Vancomycin 11/30 >>  Dose adjustments this admission: N/A  Microbiology results: 11/ BCx: 3 of 4 bottles, GPCs (both sets of blood cultures were collected from the same body site)  PLAN: Administer vancomycin 2500 mg IV x 1 as a loading dose followed by vancomycin 2000 mg IV q12H thereafter eAUC 441, Cmax 27, Cmin 14 Scr 1.07, IBW, Vd 0.5 Follow up culture results to assess for antibiotic optimization. Monitor renal function to assess for any necessary antibiotic dosing changes.   Thank you for allowing pharmacy to be a part of this patient's care.  Will M. Dareen Piano, PharmD Clinical Pharmacist 06/10/2023 1:35 PM

## 2023-06-10 NOTE — Progress Notes (Signed)
lab called 3 blood culture bottles came back. 2 aerobic and one anaerobic. all 3 showed gram positive cocci. MD Emokpae notified.

## 2023-06-10 NOTE — Progress Notes (Signed)
PHARMACIST - PHYSICIAN COMMUNICATION  CONCERNING:  Enoxaparin (Lovenox) for DVT Prophylaxis   ASSESSMENT: Patient was prescribed enoxaparin 40 mg subcutaneously every 24 hours for VTE prophylaxis.   Body mass index is 83.68 kg/m.  Estimated Creatinine Clearance: 160.4 mL/min (A) (by C-G formula based on SCr of 1.07 mg/dL (H)).  Based on [facility] policy, patient qualifies for enoxaparin dosing of 0.5 mg per kilogram of total body weight every 24 hours because their body mass index is >30 kg/m2.  PLAN: Pharmacy has adjusted enoxaparin dose per [facility] [place] [facility] policy.  Description: Patient is now receiving enoxaparin 0.5 mg/kg subcutaneously every 24 hours.  Will M. Dareen Piano, PharmD Clinical Pharmacist 06/10/2023 4:21 PM

## 2023-06-10 NOTE — Progress Notes (Signed)
Microbiology called and stated blood culture bottles showed group b strep. MD Emokpae notified.

## 2023-06-10 NOTE — TOC Initial Note (Signed)
Transition of Care [facility]) - Initial/Assessment Note    Patient Details  Name: Brittany [name] MRN: [id number] Date of Birth: 1981/03/30  Transition of Care [facility]) CM/SW Contact:    Annice Needy, LCSW Phone Number: 06/10/2023, 1:05 PM  Clinical Narrative:                 From home with spouse, 44 year old dtr, and 110 year old grandmother. Can ambulate short household distances. Uses motorized and regular wc.   Expected Discharge Plan: Home/Self Care Barriers to Discharge: Continued Medical Work up   Patient Goals and CMS Choice            Expected Discharge Plan and Services       Living arrangements for the past 2 months: Single Family Home                                      Prior Living Arrangements/Services Living arrangements for the past 2 months: Single Family Home Lives with:: Spouse, Relatives, Adult Children          Need for Family Participation in Patient Care: Yes (Comment)     Criminal Activity/Legal Involvement Pertinent to Current Situation/Hospitalization: No - Comment as needed  Activities of Daily Living      Permission Sought/Granted                  Emotional Assessment       Orientation: : Oriented to Self, Oriented to Situation, Oriented to Place, Oriented to  Time   Psych Involvement: No (comment)  Admission diagnosis:  Cellulitis of right lower extremity [L03.115] Sepsis due to cellulitis (HCC) [L03.90, A41.9] Patient Active Problem List   Diagnosis Date Noted   Hypoalbuminemia due to protein-calorie malnutrition (HCC) 06/10/2023   Leg pain 02/14/2023   Hypokalemia 01/17/2021   Hypomagnesemia 01/17/2021   Elevated troponin 01/17/2021   Lymphedema    Class 3 obesity    Gastroparesis    Gastritis and gastroduodenitis    Sepsis due to cellulitis (HCC) 03/16/2018   Hypothyroid 01/03/2018   GERD (gastroesophageal reflux disease) 08/13/2017   Uncontrolled type 2 diabetes mellitus with hyperglycemia, with  long-term current use of insulin (HCC) 08/13/2017   Sepsis (HCC) 08/11/2017   Chronic abdominal pain    Pelvic lymphadenopathy    Splenomegaly    Nausea and vomiting    Encounter for IUD removal 04/20/2017   Pressure injury of skin 10/24/2016   Cellulitis and abscess of leg 10/23/2016   Sleep apnea 10/23/2016   Diabetes mellitus without complication (HCC) 10/23/2016   Morbid obesity with BMI of 70 and over, adult (HCC) 10/23/2016   Carpal tunnel syndrome on left 10/12/2016   Yeast dermatitis 12/31/2014   Morbid obesity (HCC) 12/29/2014   Hyponatremia 12/29/2014   Cellulitis of left lower extremity 12/29/2014   PCP:  Lianne Moris, PA-C Pharmacy:   [facility] [address] [place] - [address] [place] [address] Phone: [phone] Fax: [phone]     Social Determinants of Health (SDOH) Social History: SDOH Screenings   Food Insecurity: No Food Insecurity (06/10/2023)  Housing: Low Risk  (06/10/2023)  Transportation Needs: No Transportation Needs (06/10/2023)  Utilities: Not At Risk (06/10/2023)  Financial Resource Strain: Low Risk  (08/09/2021)   Received from [facility], [facility]  Tobacco Use: Low Risk  (06/09/2023)  Health Literacy:  Low Risk  (08/09/2021)   Received from [facility], [facility]   SDOH Interventions:     Readmission Risk Interventions     No data to display

## 2023-06-10 NOTE — Progress Notes (Signed)
PROGRESS NOTE   Brittany [name], is a 42 y.o. female, DOB - 1981/02/05, XBM:[id number]  Admit date - 06/09/2023   Admitting Physician Frankey Shown, DO  Outpatient Primary MD for the patient is Lianne Moris, PA-C  LOS - 1  Chief Complaint  Patient presents with   Leg Pain       Brief Narrative:  42 y.o. female with medical history significant of morbid obesity, chronic bilateral lower extremity lymphedema, with recurrent cellulitis, DM2, with diabetic gastroparesis, and GERD admitted on 06/09/23 with group B strep bacteremia secondary to right lower extremity cellulitis in the setting of significant underlying lymphedema    -Assessment and Plan: 1)Sepsis group B strep bacteremia--secondary to right lower extremity cellulitis in the setting of significant underlying lymphedema WBC 12.7 >>11.4 -Currently on Vanco and Rocephin -Continue same -Will repeat blood cultures on 06/11/2023 -Consider echocardiogram  2) hyponatremia--- due to dehydration hydrate  3) hypomagnesemia/hypophosphatemia--replace  4)DM2--A1c 8.6 reflecting uncontrolled DM with hyperglycemia PTA -Continue Semglee Use Novolog/Humalog Sliding scale insulin with Accu-Cheks/Fingersticks as ordered   5)Morbid Obesity- -Low calorie diet, portion control and increase physical activity discussed with patient -Body mass index is 83.68 kg/m.  6) chronic lymphedema--- bilateral lower extremity -Patient declines referral to lymphedema clinic  7))Generalized weakness and deconditioning/ambulatory dysfunction--  -We most likely need physical therapy at discharge   Status is: Inpatient   Disposition: The patient is from: Home              Anticipated d/c is to: Home              Anticipated d/c date is: 2 days              Patient currently is not medically stable to d/c. Barriers: Not Clinically Stable-   Code Status :  -  Code Status: Full Code   Family Communication:   NA (patient is alert, awake and  coherent)   DVT Prophylaxis  :   - SCDs SCDs Start: 06/10/23 0146   Lab Results  Component Value Date   PLT 143 (L) 06/10/2023    Inpatient Medications  Scheduled Meds:  [START ON 06/11/2023] enoxaparin (LOVENOX) injection  0.5 mg/kg Subcutaneous Q24H   enoxaparin (LOVENOX) injection  80 mg Subcutaneous Once   feeding supplement (GLUCERNA SHAKE)  237 mL Oral TID BM   insulin aspart  0-15 Units Subcutaneous TID WC   insulin aspart  0-5 Units Subcutaneous QHS   insulin glargine-yfgn  15 Units Subcutaneous BID   Continuous Infusions:  cefTRIAXone (ROCEPHIN)  IV Stopped (06/09/23 2141)   [START ON 06/11/2023] vancomycin     And   [START ON 06/11/2023] vancomycin     PRN Meds:.acetaminophen **OR** acetaminophen, ondansetron **OR** ondansetron (ZOFRAN) IV   Anti-infectives (From admission, onward)    Start     Dose/Rate Route Frequency Ordered Stop   06/18/23 0300  vancomycin (VANCOCIN) IVPB 1000 mg/200 mL premix  Status:  Discontinued       Placed in "Followed by" Linked Group   1,000 mg 200 mL/hr over 60 Minutes Intravenous Every 12 hours 06/10/23 1334 06/10/23 1410   06/11/23 0300  vancomycin (VANCOCIN) IVPB 1000 mg/200 mL premix       Placed in "And" Linked Group   1,000 mg 200 mL/hr over 60 Minutes Intravenous Every 12 hours 06/10/23 1410     06/11/23 0200  vancomycin (VANCOCIN) IVPB 1000 mg/200 mL premix  Status:  Discontinued       Placed in "Followed by" Linked  Group   1,000 mg 200 mL/hr over 60 Minutes Intravenous Every 12 hours 06/10/23 1334 06/10/23 1410   06/11/23 0200  vancomycin (VANCOCIN) IVPB 1000 mg/200 mL premix       Placed in "And" Linked Group   1,000 mg 200 mL/hr over 60 Minutes Intravenous Every 12 hours 06/10/23 1410     06/10/23 1430  vancomycin (VANCOREADY) IVPB 1250 mg/250 mL       Placed in "Followed by" Linked Group   1,250 mg 166.7 mL/hr over 90 Minutes Intravenous  Once 06/10/23 1213 06/10/23 1718   06/10/23 1300  vancomycin (VANCOREADY) IVPB  1250 mg/250 mL       Placed in "Followed by" Linked Group   1,250 mg 166.7 mL/hr over 90 Minutes Intravenous  Once 06/10/23 1213 06/10/23 1534   06/09/23 2030  cefTRIAXone (ROCEPHIN) 2 g in sodium chloride 0.9 % 100 mL IVPB        2 g 200 mL/hr over 30 Minutes Intravenous Every 24 hours 06/09/23 2016 06/16/23 2029       Subjective: Brittany [name] today has no fevers,  No chest pain,   Nausea but no emesis -Right leg pain persist   Objective: Vitals:   06/10/23 0401 06/10/23 0426 06/10/23 0527 06/10/23 1251  BP:  127/67 (!) 134/94 117/60  Pulse:  (!) 108 90 96  Resp: 16 20 20    Temp:  (!) 100.4 F (38 C) 98.9 F (37.2 C) 99 F (37.2 C)  TempSrc:  Oral Oral   SpO2:  96% 99% 95%  Weight:      Height:        Intake/Output Summary (Last 24 hours) at 06/10/2023 1808 Last data filed at 06/10/2023 1500 Gross per 24 hour  Intake 2926.97 ml  Output --  Net 2926.97 ml   Filed Weights   06/09/23 1601 06/10/23 0058  Weight: (!) 259.9 kg (!) 272.2 kg    Physical Exam Gen:- Awake Alert, morbidly obese, in no acute distress HEENT:- [place].AT, No sclera icterus Neck-Supple Neck,No JVD,.  Lungs-  CTAB , fair symmetrical air movement CV- S1, S2 normal, regular  Abd-  +ve B.Sounds, Abd Soft, No tenderness, significant truncal adiposity/abdominal pannus   Extremity - pedal pulses present , Lymphedema of both lower extremities, erythema, warmth, swelling and tenderness persist, no open or draining wounds Psych-affect is appropriate, oriented x3 Neuro-generalized weakness, no new focal deficits, no tremors  Data Reviewed: I have personally reviewed following labs and imaging studies  CBC: Recent Labs  Lab 06/09/23 2027 06/10/23 0449  WBC 12.7* 11.4*  NEUTROABS 11.5*  --   HGB 13.0 11.2*  HCT 41.9 35.5*  MCV 89.1 89.2  PLT 145* 143*   Basic Metabolic Panel: Recent Labs  Lab 06/09/23 2144 06/10/23 0449  NA 133* 133*  K 4.3 3.8  CL 100 102  CO2 22 20*  GLUCOSE 250*  253*  BUN 12 13  CREATININE 1.00 1.07*  CALCIUM 8.6* 8.1*  MG  --  1.6*  PHOS  --  2.1*   GFR: Estimated Creatinine Clearance: 160.4 mL/min (A) (by C-G formula based on SCr of 1.07 mg/dL (H)). Liver Function Tests: Recent Labs  Lab 06/09/23 2144 06/10/23 0449  AST 19 15  ALT 21 18  ALKPHOS 72 63  BILITOT 1.0 1.1  PROT 7.8 7.0  ALBUMIN 3.3* 3.0*   Recent Results (from the past 240 hour(s))  Resp panel by RT-PCR (RSV, Flu A&B, Covid) Anterior Nasal Swab     Status: None  Collection Time: 06/09/23  8:27 PM   Specimen: Anterior Nasal Swab  Result Value Ref Range Status   SARS Coronavirus 2 by RT PCR NEGATIVE NEGATIVE Final    Comment: (NOTE) SARS-CoV-2 target nucleic acids are NOT DETECTED.  The SARS-CoV-2 RNA is generally detectable in upper respiratory specimens during the acute phase of infection. The lowest concentration of SARS-CoV-2 viral copies this assay can detect is 138 copies/mL. A negative result does not preclude SARS-Cov-2 infection and should not be used as the sole basis for treatment or other patient management decisions. A negative result may occur with  improper specimen collection/handling, submission of specimen other than nasopharyngeal swab, presence of viral mutation(s) within the areas targeted by this assay, and inadequate number of viral copies(<138 copies/mL). A negative result must be combined with clinical observations, patient history, and epidemiological information. The expected result is Negative.  Fact Sheet for Patients:  BloggerCourse.com  Fact Sheet for Healthcare Providers:  SeriousBroker.it  This test is no t yet approved or cleared by the [place] FDA and  has been authorized for detection and/or diagnosis of SARS-CoV-2 by FDA under an Emergency Use Authorization (EUA). This EUA will remain  in effect (meaning this test can be used) for the duration of the COVID-19  declaration under Section 564(b)(1) of the Act, 21 U.S.C.section 360bbb-3(b)(1), unless the authorization is terminated  or revoked sooner.       Influenza A by PCR NEGATIVE NEGATIVE Final   Influenza B by PCR NEGATIVE NEGATIVE Final    Comment: (NOTE) The Xpert Xpress SARS-CoV-2/FLU/RSV plus assay is intended as an aid in the diagnosis of influenza from Nasopharyngeal swab specimens and should not be used as a sole basis for treatment. Nasal washings and aspirates are unacceptable for Xpert Xpress SARS-CoV-2/FLU/RSV testing.  Fact Sheet for Patients: BloggerCourse.com  Fact Sheet for Healthcare Providers: SeriousBroker.it  This test is not yet approved or cleared by the [place] FDA and has been authorized for detection and/or diagnosis of SARS-CoV-2 by FDA under an Emergency Use Authorization (EUA). This EUA will remain in effect (meaning this test can be used) for the duration of the COVID-19 declaration under Section 564(b)(1) of the Act, 21 U.S.C. section 360bbb-3(b)(1), unless the authorization is terminated or revoked.     Resp Syncytial Virus by PCR NEGATIVE NEGATIVE Final    Comment: (NOTE) Fact Sheet for Patients: BloggerCourse.com  Fact Sheet for Healthcare Providers: SeriousBroker.it  This test is not yet approved or cleared by the [place] FDA and has been authorized for detection and/or diagnosis of SARS-CoV-2 by FDA under an Emergency Use Authorization (EUA). This EUA will remain in effect (meaning this test can be used) for the duration of the COVID-19 declaration under Section 564(b)(1) of the Act, 21 U.S.C. section 360bbb-3(b)(1), unless the authorization is terminated or revoked.  Performed at [facility], [address], [place], [place] [address]   Blood Culture (routine x 2)     Status: None (Preliminary result)   Collection Time: 06/09/23   8:27 PM   Specimen: Left Antecubital; Blood  Result Value Ref Range Status   Specimen Description LEFT ANTECUBITAL  Final   Special Requests   Final    BOTTLES DRAWN AEROBIC AND ANAEROBIC Blood Culture adequate volume   Culture  Setup Time   Final    GRAM POSITIVE COCCI AEROBIC BOTTLE ONLY Gram Stain Report Called to,Read Back By and Verified With: LEONARD T @ 1152 ON [id number] BY HENDERSON L Performed at [facility]  [facility], [address] [place], [place] [address]    Culture GRAM POSITIVE COCCI  Final   Report Status PENDING  Incomplete  Blood Culture (routine x 2)     Status: None (Preliminary result)   Collection Time: 06/09/23  8:37 PM   Specimen: Left Antecubital; Blood  Result Value Ref Range Status   Specimen Description   Final    LEFT ANTECUBITAL Performed at [facility], [address], [place], [place] [address]    Special Requests   Final    BOTTLES DRAWN AEROBIC AND ANAEROBIC Blood Culture adequate volume Performed at [facility], [address], [place], [place] [address]    Culture  Setup Time   Final    GRAM POSITIVE COCCI IN BOTH AEROBIC AND ANAEROBIC BOTTLES Gram Stain Report Called to,Read Back By and Verified With: LEONARD TORY @ 1152 ON [id number] BY HENDERSON L CRITICAL RESULT CALLED TO, READ BACK BY AND VERIFIED WITH: RN Danna Hefty [id number] AT 1525 BY EC Performed at [facility], [address], [place], [place] [address]    Culture GRAM POSITIVE COCCI  Final   Report Status PENDING  Incomplete  Blood Culture ID Panel (Reflexed)     Status: Abnormal   Collection Time: 06/09/23  8:37 PM  Result Value Ref Range Status   Enterococcus faecalis NOT DETECTED NOT DETECTED Final   Enterococcus Faecium NOT DETECTED NOT DETECTED Final   Listeria monocytogenes NOT DETECTED NOT DETECTED Final   Staphylococcus species NOT DETECTED NOT DETECTED Final   Staphylococcus aureus (BCID) NOT DETECTED NOT DETECTED Final   Staphylococcus epidermidis NOT DETECTED NOT DETECTED  Final   Staphylococcus lugdunensis NOT DETECTED NOT DETECTED Final   Streptococcus species DETECTED (A) NOT DETECTED Final    Comment: CRITICAL RESULT CALLED TO, READ BACK BY AND VERIFIED WITH: RN Danna Hefty [id number] AT 1725 BY EC    Streptococcus agalactiae DETECTED (A) NOT DETECTED Final    Comment: CRITICAL RESULT CALLED TO, READ BACK BY AND VERIFIED WITH: RN Danna Hefty [id number] AT 1725 BY EC    Streptococcus pneumoniae NOT DETECTED NOT DETECTED Final   Streptococcus pyogenes NOT DETECTED NOT DETECTED Final   A.calcoaceticus-baumannii NOT DETECTED NOT DETECTED Final   Bacteroides fragilis NOT DETECTED NOT DETECTED Final   Enterobacterales NOT DETECTED NOT DETECTED Final   Enterobacter cloacae complex NOT DETECTED NOT DETECTED Final   Escherichia coli NOT DETECTED NOT DETECTED Final   Klebsiella aerogenes NOT DETECTED NOT DETECTED Final   Klebsiella oxytoca NOT DETECTED NOT DETECTED Final   Klebsiella pneumoniae NOT DETECTED NOT DETECTED Final   Proteus species NOT DETECTED NOT DETECTED Final   Salmonella species NOT DETECTED NOT DETECTED Final   Serratia marcescens NOT DETECTED NOT DETECTED Final   Haemophilus influenzae NOT DETECTED NOT DETECTED Final   Neisseria meningitidis NOT DETECTED NOT DETECTED Final   Pseudomonas aeruginosa NOT DETECTED NOT DETECTED Final   Stenotrophomonas maltophilia NOT DETECTED NOT DETECTED Final   Candida albicans NOT DETECTED NOT DETECTED Final   Candida auris NOT DETECTED NOT DETECTED Final   Candida glabrata NOT DETECTED NOT DETECTED Final   Candida krusei NOT DETECTED NOT DETECTED Final   Candida parapsilosis NOT DETECTED NOT DETECTED Final   Candida tropicalis NOT DETECTED NOT DETECTED Final   Cryptococcus neoformans/gattii NOT DETECTED NOT DETECTED Final    Comment: Performed at [facility], [address] [place], [place] [address]      Radiology Studies: DG Chest Portable 1 View  Result Date: 06/09/2023 CLINICAL DATA:   Cough, fever. EXAM: PORTABLE CHEST 1 VIEW COMPARISON:  January 16, 2021. FINDINGS: The heart size and mediastinal contours are within normal limits. Both lungs are clear. The visualized skeletal structures are unremarkable. IMPRESSION: No active disease. Electronically Signed   By: Lupita Raider M.D.   On: 06/09/2023 21:00     Scheduled Meds:  [START ON 06/11/2023] enoxaparin (LOVENOX) injection  0.5 mg/kg Subcutaneous Q24H   enoxaparin (LOVENOX) injection  80 mg Subcutaneous Once   feeding supplement (GLUCERNA SHAKE)  237 mL Oral TID BM   insulin aspart  0-15 Units Subcutaneous TID WC   insulin aspart  0-5 Units Subcutaneous QHS   insulin glargine-yfgn  15 Units Subcutaneous BID   Continuous Infusions:  cefTRIAXone (ROCEPHIN)  IV Stopped (06/09/23 2141)   [START ON 06/11/2023] vancomycin     And   [START ON 06/11/2023] vancomycin       LOS: 1 day    Shon Hale M.D on 06/10/2023 at 6:08 PM  Go to [email or web address] - for contact info  [facility] - Office  [phone]  If 7PM-7AM, please contact night-coverage [email or web address] 06/10/2023, 6:08 PM

## 2023-06-11 DIAGNOSIS — L0390 Cellulitis, unspecified: Secondary | ICD-10-CM | POA: Diagnosis not present

## 2023-06-11 DIAGNOSIS — A419 Sepsis, unspecified organism: Secondary | ICD-10-CM | POA: Diagnosis not present

## 2023-06-11 LAB — GLUCOSE, CAPILLARY
Glucose-Capillary: 218 mg/dL — ABNORMAL HIGH (ref 70–99)
Glucose-Capillary: 206 mg/dL — ABNORMAL HIGH (ref 70–99)
Glucose-Capillary: 226 mg/dL — ABNORMAL HIGH (ref 70–99)
Glucose-Capillary: 189 mg/dL — ABNORMAL HIGH (ref 70–99)

## 2023-06-11 LAB — CBC
HCT: 34.3 % — ABNORMAL LOW (ref 36.0–46.0)
Hemoglobin: 10.5 g/dL — ABNORMAL LOW (ref 12.0–15.0)
MCH: 27.3 pg (ref 26.0–34.0)
MCHC: 30.6 g/dL (ref 30.0–36.0)
MCV: 89.3 fL (ref 80.0–100.0)
Platelets: 134 10*3/uL — ABNORMAL LOW (ref 150–400)
RBC: 3.84 MIL/uL — ABNORMAL LOW (ref 3.87–5.11)
RDW: 15.2 % (ref 11.5–15.5)
WBC: 5.4 10*3/uL (ref 4.0–10.5)
nRBC: 0 % (ref 0.0–0.2)

## 2023-06-11 LAB — CREATININE, SERUM
Creatinine, Ser: 0.98 mg/dL (ref 0.44–1.00)
GFR, Estimated: >60 mL/min (ref >60)

## 2023-06-11 MED ORDER — NYSTATIN-TRIAMCINOLONE 100000-0.1 UNIT/GM-% EX CREA
TOPICAL_CREAM | Freq: Two times a day (BID) | CUTANEOUS | Status: DC
  Filled 2023-06-11: qty 15

## 2023-06-11 MED ORDER — PENICILLIN G POTASSIUM 20000000 UNITS IJ SOLR
4.0000 10*6.[IU] | INTRAVENOUS | Status: DC
  Administered 2023-06-11 – 2023-06-15 (×22): 4 10*6.[IU] via INTRAVENOUS
  Filled 2023-06-11: qty 5.71
  Filled 2023-06-11 (×2): qty 4
  Filled 2023-06-11: qty 5.71
  Filled 2023-06-11 (×5): qty 4
  Filled 2023-06-11: qty 5.71
  Filled 2023-06-11: qty 4
  Filled 2023-06-11 (×2): qty 5.71
  Filled 2023-06-11 (×11): qty 4
  Filled 2023-06-11: qty 5.71
  Filled 2023-06-11 (×5): qty 4

## 2023-06-11 MED ORDER — ADULT MULTIVITAMIN W/MINERALS CH
1.0000 | ORAL_TABLET | Freq: Every day | ORAL | Status: DC
  Administered 2023-06-11 – 2023-06-15 (×5): 1 via ORAL
  Filled 2023-06-11 (×5): qty 1

## 2023-06-11 MED ORDER — BUSPIRONE HCL 5 MG PO TABS
10.0000 mg | ORAL_TABLET | Freq: Three times a day (TID) | ORAL | Status: DC
  Administered 2023-06-11 – 2023-06-14 (×8): 10 mg via ORAL
  Filled 2023-06-11 (×10): qty 2

## 2023-06-11 MED ORDER — NYSTATIN 100000 UNIT/GM EX POWD
Freq: Three times a day (TID) | CUTANEOUS | Status: DC
  Filled 2023-06-11: qty 15

## 2023-06-11 NOTE — Plan of Care (Signed)
  Problem: Education: Goal: Knowledge of General Education information will improve Description: Including pain rating scale, medication(s)/side effects and non-pharmacologic comfort measures Outcome: Progressing   Problem: Skin Integrity: Goal: Risk for impaired skin integrity will decrease Outcome: Not Progressing   Problem: Skin Integrity: Goal: Risk for impaired skin integrity will decrease Outcome: Not Progressing

## 2023-06-11 NOTE — Progress Notes (Signed)
Initial Nutrition Assessment  DOCUMENTATION CODES:   Morbid obesity  INTERVENTION: Continue Heart healthy Carb modified diet Continue Glucerna Shake po TID, each supplement provides 220 kcal and 10 grams of protein Multivitamin with minerals   NUTRITION DIAGNOSIS:   Increased nutrient needs related to acute illness as evidenced by estimated needs.    GOAL:   Patient will meet greater than or equal to 90% of their needs    MONITOR:   PO intake, Supplement acceptance, Labs  REASON FOR ASSESSMENT:   Consult Assessment of nutrition requirement/status  ASSESSMENT:   42 y.o. F, admitted from home with sepsis due to cellulitis. PMH: DMT2, lymphedema, depression, anxiety, GERD, Chronic headache, gastroparesis, CAD, AICD, PVD, Dementia, Hepatitis. Review of EMR revealed;  Weight stability 4% weight gain x 30 days,  Good intake with no changes reported. Independent living status. Independent feeding ability.  No chewing/swallowing changes noted.  Currently receiving Glucerna Shake po TID, each supplement provides 220 kcal and 10 grams of protein.  No current MVM noted.   Admit weight: 272.2 kg Current weight: 272.2 kg  Weight history:  06/10/23 (!) 272.2 kg  05/07/23 (!) 260.8 kg  02/13/23 (!) 260.8 kg   Last Weight  Most recent update: 06/10/2023  1:17 AM    Weight  272.2 kg (600 lb)               Average Meal Intake: 100% intake x 2 recorded meals  Nutritionally Relevant Medications: Scheduled Meds:  busPIRone  10 mg Oral TID   feeding supplement (GLUCERNA SHAKE)  237 mL Oral TID BM   phosphorus  500 mg Oral TID    Continuous Infusions:  cefTRIAXone (ROCEPHIN)  IV 2 g (06/10/23 2124)   vancomycin 1,000 mg (06/11/23 0251)   And   vancomycin 1,000 mg (06/11/23 0434)   PRN Meds:.acetaminophen **OR** acetaminophen, ondansetron **OR** ondansetron (ZOFRAN) IV  Labs Reviewed    NUTRITION - FOCUSED PHYSICAL EXAM:  Deferred   Diet Order:   Diet Order              Diet heart healthy/carb modified Room service appropriate? Yes; Fluid consistency: Thin  Diet effective now                   EDUCATION NEEDS:   No education needs have been identified at this time  Skin:  Skin Assessment: Reviewed RN Assessment  Last BM:  12/1  Height:   Ht Readings from Last 1 Encounters:  06/09/23 5\' 11"  (1.803 m)    Weight:   Wt Readings from Last 1 Encounters:  06/10/23 (!) 272.2 kg    Ideal Body Weight:     BMI:  Body mass index is 83.68 kg/m.  Estimated Nutritional Needs:   Kcal:  2150-2500 kcal/d  Protein:  95-110 g/d  Fluid:  68ml/kcal    Jamelle Haring RDN, LDN Clinical Dietitian  RDN pager # available on Amion

## 2023-06-11 NOTE — Progress Notes (Signed)
PROGRESS NOTE   Brittany [name], is a 42 y.o. female, DOB - 12-08-1980, ZOX:[id number]  Admit date - 06/09/2023   Admitting Physician Frankey Shown, DO  Outpatient Primary MD for the patient is Lianne Moris, PA-C  LOS - 2  Chief Complaint  Patient presents with   Leg Pain       Brief Narrative:  42 y.o. female with medical history significant of morbid obesity, chronic bilateral lower extremity lymphedema, with recurrent cellulitis, DM2, with diabetic gastroparesis, and GERD admitted on 06/09/23 with group B strep bacteremia secondary to right lower extremity cellulitis in the setting of significant underlying lymphedema    -Assessment and Plan: 1)Sepsis group B strep bacteremia--secondary to right lower extremity cellulitis in the setting of significant underlying lymphedema WBC 12.7 >>11.4>>5.4 -Treated with Vanco and Rocephin 06/11/23 -Repeat blood cultures on 06/11/2023 -Echo requested -Okay to de-escalate to penicillin G IV  2)Hyponatremia--- due to dehydration hydrate  3) hypomagnesemia/hypophosphatemia--replace  4)DM2--A1c 8.6 reflecting uncontrolled DM with hyperglycemia PTA -Continue Semglee Use Novolog/Humalog Sliding scale insulin with Accu-Cheks/Fingersticks as ordered   5)Morbid Obesity- -Low calorie diet, portion control and increase physical activity discussed with patient -Body mass index is 83.68 kg/m.  6) chronic lymphedema--- bilateral lower extremity -Patient declines referral to lymphedema clinic  7))Generalized weakness and deconditioning/ambulatory dysfunction--  -We most likely need physical therapy at discharge  8) candidal intertrigo--erythematous rash with satellite lesions under skin folds /pannus and inframammary areas -Topical nystatin with triamcinolone requested  Status is: Inpatient   Disposition: The patient is from: Home              Anticipated d/c is to: Home              Anticipated d/c date is: 2 days              Patient  currently is not medically stable to d/c. Barriers: Not Clinically Stable-   Code Status :  -  Code Status: Full Code   Family Communication:   NA (patient is alert, awake and coherent)   DVT Prophylaxis  :   - SCDs SCDs Start: 06/10/23 0146   Lab Results  Component Value Date   PLT 134 (L) 06/11/2023    Inpatient Medications  Scheduled Meds:  busPIRone  10 mg Oral TID   enoxaparin (LOVENOX) injection  0.5 mg/kg Subcutaneous Q24H   feeding supplement (GLUCERNA SHAKE)  237 mL Oral TID BM   insulin aspart  0-15 Units Subcutaneous TID WC   insulin aspart  0-5 Units Subcutaneous QHS   insulin glargine-yfgn  15 Units Subcutaneous BID   multivitamin with minerals  1 tablet Oral Daily   nystatin-triamcinolone   Topical BID   phosphorus  500 mg Oral TID   Continuous Infusions:  pencillin G potassium IV 4 Million Units (06/11/23 1540)   PRN Meds:.acetaminophen **OR** acetaminophen, ondansetron **OR** ondansetron (ZOFRAN) IV   Anti-infectives (From admission, onward)    Start     Dose/Rate Route Frequency Ordered Stop   06/18/23 0300  vancomycin (VANCOCIN) IVPB 1000 mg/200 mL premix  Status:  Discontinued       Placed in "Followed by" Linked Group   1,000 mg 200 mL/hr over 60 Minutes Intravenous Every 12 hours 06/10/23 1334 06/10/23 1410   06/11/23 1600  penicillin G potassium 4 Million Units in dextrose 5 % 250 mL IVPB        4 Million Units 250 mL/hr over 60 Minutes Intravenous Every 4 hours 06/11/23 1216  06/11/23 0300  vancomycin (VANCOCIN) IVPB 1000 mg/200 mL premix  Status:  Discontinued       Placed in "And" Linked Group   1,000 mg 200 mL/hr over 60 Minutes Intravenous Every 12 hours 06/10/23 1410 06/11/23 1149   06/11/23 0200  vancomycin (VANCOCIN) IVPB 1000 mg/200 mL premix  Status:  Discontinued       Placed in "Followed by" Linked Group   1,000 mg 200 mL/hr over 60 Minutes Intravenous Every 12 hours 06/10/23 1334 06/10/23 1410   06/11/23 0200  vancomycin  (VANCOCIN) IVPB 1000 mg/200 mL premix  Status:  Discontinued       Placed in "And" Linked Group   1,000 mg 200 mL/hr over 60 Minutes Intravenous Every 12 hours 06/10/23 1410 06/11/23 1149   06/10/23 1430  vancomycin (VANCOREADY) IVPB 1250 mg/250 mL       Placed in "Followed by" Linked Group   1,250 mg 166.7 mL/hr over 90 Minutes Intravenous  Once 06/10/23 1213 06/10/23 1718   06/10/23 1300  vancomycin (VANCOREADY) IVPB 1250 mg/250 mL       Placed in "Followed by" Linked Group   1,250 mg 166.7 mL/hr over 90 Minutes Intravenous  Once 06/10/23 1213 06/10/23 1534   06/09/23 2030  cefTRIAXone (ROCEPHIN) 2 g in sodium chloride 0.9 % 100 mL IVPB  Status:  Discontinued        2 g 200 mL/hr over 30 Minutes Intravenous Every 24 hours 06/09/23 2016 06/11/23 1216       Subjective: Brittany [name] today has no fevers,  No chest pain,   Nausea but no emesis -Right leg pain is not worse -Reports area of maceration under skin folds and pannus   Objective: Vitals:   06/10/23 1251 06/10/23 2023 06/11/23 0434 06/11/23 1330  BP: 117/60 (!) 140/76 108/63 (!) 141/78  Pulse: 96 97 90 91  Resp:  19 20 (!) 21  Temp: 99 F (37.2 C) 100 F (37.8 C) 98.6 F (37 C) 98 F (36.7 C)  TempSrc:  Oral Oral Oral  SpO2: 95% 94% 97% 95%  Weight:      Height:        Intake/Output Summary (Last 24 hours) at 06/11/2023 1839 Last data filed at 06/11/2023 1716 Gross per 24 hour  Intake 750 ml  Output --  Net 750 ml   Filed Weights   06/09/23 1601 06/10/23 0058  Weight: (!) 259.9 kg (!) 272.2 kg    Physical Exam Gen:- Awake Alert, morbidly obese, in no acute distress HEENT:- [place].AT, No sclera icterus Neck-Supple Neck,No JVD,.  Lungs-  CTAB , fair symmetrical air movement CV- S1, S2 normal, regular  Abd-  +ve B.Sounds, Abd Soft, No tenderness, significant truncal adiposity/abdominal pannus   Extremity - pedal pulses present , Lymphedema of both lower extremities, Rt leg with erythema, warmth, swelling  and tenderness persist, no open or draining wounds Skin--erythematous rash with satellite lesions under skin folds /pannus and inframammary areas Psych-affect is appropriate, oriented x3 Neuro-generalized weakness, no new focal deficits, no tremors  Data Reviewed: I have personally reviewed following labs and imaging studies  CBC: Recent Labs  Lab 06/09/23 2027 06/10/23 0449 06/11/23 0443  WBC 12.7* 11.4* 5.4  NEUTROABS 11.5*  --   --   HGB 13.0 11.2* 10.5*  HCT 41.9 35.5* 34.3*  MCV 89.1 89.2 89.3  PLT 145* 143* 134*   Basic Metabolic Panel: Recent Labs  Lab 06/09/23 2144 06/10/23 0449 06/11/23 0443  NA 133* 133*  --  K 4.3 3.8  --   CL 100 102  --   CO2 22 20*  --   GLUCOSE 250* 253*  --   BUN 12 13  --   CREATININE 1.00 1.07* 0.98  CALCIUM 8.6* 8.1*  --   MG  --  1.6*  --   PHOS  --  2.1*  --    GFR: Estimated Creatinine Clearance: 175.1 mL/min (by C-G formula based on SCr of 0.98 mg/dL). Liver Function Tests: Recent Labs  Lab 06/09/23 2144 06/10/23 0449  AST 19 15  ALT 21 18  ALKPHOS 72 63  BILITOT 1.0 1.1  PROT 7.8 7.0  ALBUMIN 3.3* 3.0*   Recent Results (from the past 240 hour(s))  Resp panel by RT-PCR (RSV, Flu A&B, Covid) Anterior Nasal Swab     Status: None   Collection Time: 06/09/23  8:27 PM   Specimen: Anterior Nasal Swab  Result Value Ref Range Status   SARS Coronavirus 2 by RT PCR NEGATIVE NEGATIVE Final    Comment: (NOTE) SARS-CoV-2 target nucleic acids are NOT DETECTED.  The SARS-CoV-2 RNA is generally detectable in upper respiratory specimens during the acute phase of infection. The lowest concentration of SARS-CoV-2 viral copies this assay can detect is 138 copies/mL. A negative result does not preclude SARS-Cov-2 infection and should not be used as the sole basis for treatment or other patient management decisions. A negative result may occur with  improper specimen collection/handling, submission of specimen other than  nasopharyngeal swab, presence of viral mutation(s) within the areas targeted by this assay, and inadequate number of viral copies(<138 copies/mL). A negative result must be combined with clinical observations, patient history, and epidemiological information. The expected result is Negative.  Fact Sheet for Patients:  BloggerCourse.com  Fact Sheet for Healthcare Providers:  SeriousBroker.it  This test is no t yet approved or cleared by the [place] FDA and  has been authorized for detection and/or diagnosis of SARS-CoV-2 by FDA under an Emergency Use Authorization (EUA). This EUA will remain  in effect (meaning this test can be used) for the duration of the COVID-19 declaration under Section 564(b)(1) of the Act, 21 U.S.C.section 360bbb-3(b)(1), unless the authorization is terminated  or revoked sooner.       Influenza A by PCR NEGATIVE NEGATIVE Final   Influenza B by PCR NEGATIVE NEGATIVE Final    Comment: (NOTE) The Xpert Xpress SARS-CoV-2/FLU/RSV plus assay is intended as an aid in the diagnosis of influenza from Nasopharyngeal swab specimens and should not be used as a sole basis for treatment. Nasal washings and aspirates are unacceptable for Xpert Xpress SARS-CoV-2/FLU/RSV testing.  Fact Sheet for Patients: BloggerCourse.com  Fact Sheet for Healthcare Providers: SeriousBroker.it  This test is not yet approved or cleared by the [place] FDA and has been authorized for detection and/or diagnosis of SARS-CoV-2 by FDA under an Emergency Use Authorization (EUA). This EUA will remain in effect (meaning this test can be used) for the duration of the COVID-19 declaration under Section 564(b)(1) of the Act, 21 U.S.C. section 360bbb-3(b)(1), unless the authorization is terminated or revoked.     Resp Syncytial Virus by PCR NEGATIVE NEGATIVE Final    Comment:  (NOTE) Fact Sheet for Patients: BloggerCourse.com  Fact Sheet for Healthcare Providers: SeriousBroker.it  This test is not yet approved or cleared by the [place] FDA and has been authorized for detection and/or diagnosis of SARS-CoV-2 by FDA under an Emergency Use Authorization (EUA). This EUA will remain in  effect (meaning this test can be used) for the duration of the COVID-19 declaration under Section 564(b)(1) of the Act, 21 U.S.C. section 360bbb-3(b)(1), unless the authorization is terminated or revoked.  Performed at [facility], [address], [place], [place] [address]   Blood Culture (routine x 2)     Status: Abnormal (Preliminary result)   Collection Time: 06/09/23  8:27 PM   Specimen: Left Antecubital; Blood  Result Value Ref Range Status   Specimen Description   Final    LEFT ANTECUBITAL Performed at [facility], [address] [place], [place] [address]    Special Requests   Final    BOTTLES DRAWN AEROBIC AND ANAEROBIC Blood Culture adequate volume Performed at [facility], [address] [place], [place] [address]    Culture  Setup Time   Final    GRAM POSITIVE COCCI AEROBIC BOTTLE ONLY Gram Stain Report Called to,Read Back By and Verified With: LEONARD T @ 1152 ON [id number] BY HENDERSON L Performed at [facility], [address] [place], [place] [address]    Culture GROUP B STREP(S.AGALACTIAE)ISOLATED (A)  Final   Report Status PENDING  Incomplete  Blood Culture (routine x 2)     Status: Abnormal (Preliminary result)   Collection Time: 06/09/23  8:37 PM   Specimen: Left Antecubital; Blood  Result Value Ref Range Status   Specimen Description   Final    LEFT ANTECUBITAL Performed at [facility], [address], [place], [place] [address]    Special Requests   Final    BOTTLES DRAWN AEROBIC AND ANAEROBIC Blood Culture adequate volume Performed at [facility], [address] [place], [place]  [address]    Culture  Setup Time   Final    GRAM POSITIVE COCCI IN BOTH AEROBIC AND ANAEROBIC BOTTLES Gram Stain Report Called to,Read Back By and Verified With: LEONARD TORY @ 1152 ON [id number] BY HENDERSON L CRITICAL RESULT CALLED TO, READ BACK BY AND VERIFIED WITH: RN Danna Hefty [id number] AT 1525 BY EC    Culture (A)  Final    GROUP B STREP(S.AGALACTIAE)ISOLATED SUSCEPTIBILITIES TO FOLLOW Performed at [facility], [address] [place], [place] [address]    Report Status PENDING  Incomplete  Blood Culture ID Panel (Reflexed)     Status: Abnormal   Collection Time: 06/09/23  8:37 PM  Result Value Ref Range Status   Enterococcus faecalis NOT DETECTED NOT DETECTED Final   Enterococcus Faecium NOT DETECTED NOT DETECTED Final   Listeria monocytogenes NOT DETECTED NOT DETECTED Final   Staphylococcus species NOT DETECTED NOT DETECTED Final   Staphylococcus aureus (BCID) NOT DETECTED NOT DETECTED Final   Staphylococcus epidermidis NOT DETECTED NOT DETECTED Final   Staphylococcus lugdunensis NOT DETECTED NOT DETECTED Final   Streptococcus species DETECTED (A) NOT DETECTED Final    Comment: CRITICAL RESULT CALLED TO, READ BACK BY AND VERIFIED WITH: RN Danna Hefty [id number] AT 1725 BY EC    Streptococcus agalactiae DETECTED (A) NOT DETECTED Final    Comment: CRITICAL RESULT CALLED TO, READ BACK BY AND VERIFIED WITH: RN Danna Hefty [id number] AT 1725 BY EC    Streptococcus pneumoniae NOT DETECTED NOT DETECTED Final   Streptococcus pyogenes NOT DETECTED NOT DETECTED Final   A.calcoaceticus-baumannii NOT DETECTED NOT DETECTED Final   Bacteroides fragilis NOT DETECTED NOT DETECTED Final   Enterobacterales NOT DETECTED NOT DETECTED Final   Enterobacter cloacae complex NOT DETECTED NOT DETECTED Final   Escherichia coli NOT DETECTED NOT DETECTED Final  Klebsiella aerogenes NOT DETECTED NOT DETECTED Final   Klebsiella oxytoca NOT DETECTED NOT DETECTED Final   Klebsiella pneumoniae NOT  DETECTED NOT DETECTED Final   Proteus species NOT DETECTED NOT DETECTED Final   Salmonella species NOT DETECTED NOT DETECTED Final   Serratia marcescens NOT DETECTED NOT DETECTED Final   Haemophilus influenzae NOT DETECTED NOT DETECTED Final   Neisseria meningitidis NOT DETECTED NOT DETECTED Final   Pseudomonas aeruginosa NOT DETECTED NOT DETECTED Final   Stenotrophomonas maltophilia NOT DETECTED NOT DETECTED Final   Candida albicans NOT DETECTED NOT DETECTED Final   Candida auris NOT DETECTED NOT DETECTED Final   Candida glabrata NOT DETECTED NOT DETECTED Final   Candida krusei NOT DETECTED NOT DETECTED Final   Candida parapsilosis NOT DETECTED NOT DETECTED Final   Candida tropicalis NOT DETECTED NOT DETECTED Final   Cryptococcus neoformans/gattii NOT DETECTED NOT DETECTED Final    Comment: Performed at [facility], [address], [place], [place] [address]  Culture, blood (Routine X 2) w Reflex to ID Panel     Status: None (Preliminary result)   Collection Time: 06/11/23 10:51 AM   Specimen: Right Antecubital; Blood  Result Value Ref Range Status   Specimen Description   Final    RIGHT ANTECUBITAL BOTTLES DRAWN AEROBIC AND ANAEROBIC   Special Requests   Final    Blood Culture adequate volume Performed at [facility], [address] [place], [place] [address]    Culture PENDING  Incomplete   Report Status PENDING  Incomplete  Culture, blood (Routine X 2) w Reflex to ID Panel     Status: None (Preliminary result)   Collection Time: 06/11/23 10:51 AM   Specimen: Left Antecubital; Blood  Result Value Ref Range Status   Specimen Description   Final    LEFT ANTECUBITAL BOTTLES DRAWN AEROBIC AND ANAEROBIC   Special Requests   Final    Blood Culture adequate volume Performed at [facility], [address] [place], [place] [address]    Culture PENDING  Incomplete   Report Status PENDING  Incomplete    Radiology Studies: DG Chest Portable 1 View  Result Date:  06/09/2023 CLINICAL DATA:  Cough, fever. EXAM: PORTABLE CHEST 1 VIEW COMPARISON:  January 16, 2021. FINDINGS: The heart size and mediastinal contours are within normal limits. Both lungs are clear. The visualized skeletal structures are unremarkable. IMPRESSION: No active disease. Electronically Signed   By: Lupita Raider M.D.   On: 06/09/2023 21:00    Scheduled Meds:  busPIRone  10 mg Oral TID   enoxaparin (LOVENOX) injection  0.5 mg/kg Subcutaneous Q24H   feeding supplement (GLUCERNA SHAKE)  237 mL Oral TID BM   insulin aspart  0-15 Units Subcutaneous TID WC   insulin aspart  0-5 Units Subcutaneous QHS   insulin glargine-yfgn  15 Units Subcutaneous BID   multivitamin with minerals  1 tablet Oral Daily   nystatin-triamcinolone   Topical BID   phosphorus  500 mg Oral TID   Continuous Infusions:  pencillin G potassium IV 4 Million Units (06/11/23 1540)     LOS: 2 days   Shon Hale M.D on 06/11/2023 at 6:39 PM  Go to [email or web address] - for contact info  [facility] - Office  [phone]  If 7PM-7AM, please contact night-coverage [email or web address] 06/11/2023, 6:39 PM

## 2023-06-12 ENCOUNTER — Inpatient Hospital Stay (HOSPITAL_COMMUNITY): Payer: Medicaid Other

## 2023-06-12 ENCOUNTER — Other Ambulatory Visit (HOSPITAL_COMMUNITY): Payer: Self-pay | Admitting: *Deleted

## 2023-06-12 DIAGNOSIS — A419 Sepsis, unspecified organism: Secondary | ICD-10-CM | POA: Diagnosis not present

## 2023-06-12 DIAGNOSIS — R7881 Bacteremia: Secondary | ICD-10-CM

## 2023-06-12 DIAGNOSIS — L0390 Cellulitis, unspecified: Secondary | ICD-10-CM | POA: Diagnosis not present

## 2023-06-12 LAB — CULTURE, BLOOD (ROUTINE X 2)
Special Requests: ADEQUATE
Special Requests: ADEQUATE

## 2023-06-12 LAB — BASIC METABOLIC PANEL
Anion gap: 9 (ref 5–15)
BUN: 13 mg/dL (ref 6–20)
CO2: 22 mmol/L (ref 22–32)
Calcium: 8.3 mg/dL — ABNORMAL LOW (ref 8.9–10.3)
Chloride: 105 mmol/L (ref 98–111)
Creatinine, Ser: 0.9 mg/dL (ref 0.44–1.00)
GFR, Estimated: >60 mL/min (ref >60)
Glucose, Bld: 220 mg/dL — ABNORMAL HIGH (ref 70–99)
Potassium: 3.6 mmol/L (ref 3.5–5.1)
Sodium: 136 mmol/L (ref 135–145)

## 2023-06-12 LAB — GLUCOSE, CAPILLARY
Glucose-Capillary: 201 mg/dL — ABNORMAL HIGH (ref 70–99)
Glucose-Capillary: 190 mg/dL — ABNORMAL HIGH (ref 70–99)
Glucose-Capillary: 235 mg/dL — ABNORMAL HIGH (ref 70–99)
Glucose-Capillary: 163 mg/dL — ABNORMAL HIGH (ref 70–99)

## 2023-06-12 LAB — CBC
HCT: 33.6 % — ABNORMAL LOW (ref 36.0–46.0)
Hemoglobin: 10.4 g/dL — ABNORMAL LOW (ref 12.0–15.0)
MCH: 27.4 pg (ref 26.0–34.0)
MCHC: 31 g/dL (ref 30.0–36.0)
MCV: 88.4 fL (ref 80.0–100.0)
Platelets: 162 10*3/uL (ref 150–400)
RBC: 3.8 MIL/uL — ABNORMAL LOW (ref 3.87–5.11)
RDW: 15.1 % (ref 11.5–15.5)
WBC: 5 10*3/uL (ref 4.0–10.5)
nRBC: 0 % (ref 0.0–0.2)

## 2023-06-12 LAB — ECHOCARDIOGRAM COMPLETE
Area-P 1/2: 3.91 cm2
Height: 71 in
S' Lateral: 2.9 cm
Weight: 9600 [oz_av]

## 2023-06-12 MED ORDER — OXYCODONE HCL 5 MG PO TABS
5.0000 mg | ORAL_TABLET | Freq: Three times a day (TID) | ORAL | Status: DC | PRN
  Administered 2023-06-12 – 2023-06-14 (×3): 5 mg via ORAL
  Filled 2023-06-12 (×3): qty 1

## 2023-06-12 MED ORDER — PERFLUTREN LIPID MICROSPHERE
1.0000 mL | INTRAVENOUS | Status: AC | PRN
  Administered 2023-06-12: 4 mL via INTRAVENOUS

## 2023-06-12 NOTE — Inpatient Diabetes Management (Signed)
Inpatient Diabetes Program Recommendations  AACE/ADA: New Consensus Statement on Inpatient Glycemic Control   Target Ranges:  Prepandial:   less than 140 mg/dL      Peak postprandial:   less than 180 mg/dL (1-2 hours)      Critically ill patients:  140 - 180 mg/dL    Latest Reference Range & Units 06/11/23 07:25 06/11/23 11:18 06/11/23 17:13 06/11/23 22:56 06/12/23 07:33  Glucose-Capillary 70 - 99 mg/dL 161 (H) 096 (H) 045 (H) 189 (H) 201 (H)   Review of Glycemic Control  Diabetes history: DM2 Outpatient Diabetes medications: Semglee 23 units QAM, Semglee 90 units at bedtime, Novolog 5-28 units TID with meals, Victoza 1.8 mg daily Current orders for Inpatient glycemic control: Semglee 15 units BID, Novolog 0-15 units TID with meals, Novolog 0-5 units QHS  Inpatient Diabetes Program Recommendations:    Insulin: Please consider ordering Novolog 4 units TID with meals for meal coverage if patient eats at least 50% of meals.  Thanks, Orlando Penner, RN, MSN, CDCES Diabetes Coordinator Inpatient Diabetes Program [phone] (Team Pager from 8am to 5pm)

## 2023-06-12 NOTE — Progress Notes (Signed)
*  PRELIMINARY RESULTS* Echocardiogram 2D Echocardiogram has been performed with Definity.  Stacey Drain 06/12/2023, 4:42 PM

## 2023-06-12 NOTE — Plan of Care (Signed)

## 2023-06-12 NOTE — Progress Notes (Addendum)
PROGRESS NOTE   Brittany [name], is a 42 y.o. female, DOB - 26-Feb-1981, WUJ:[id number]  Admit date - 06/09/2023   Admitting Physician Frankey Shown, DO  Outpatient Primary MD for the patient is Brittany Moris, PA-C  LOS - 3  Chief Complaint  Patient presents with   Leg Pain       Brief Narrative:  42 y.o. female with medical history significant of morbid obesity, chronic bilateral lower extremity lymphedema, with recurrent cellulitis, DM2, with diabetic gastroparesis, and GERD admitted on 06/09/23 with group B strep bacteremia secondary to right lower extremity cellulitis in the setting of significant underlying lymphedema    -Assessment and Plan: 1)Sepsis group B strep bacteremia--secondary to right lower extremity cellulitis in the setting of significant underlying lymphedema WBC 12.7 >>11.4>>5.4 >> 5.0 -Treated with Vanco and Rocephin 06/12/23 -Repeat blood cultures on 06/11/2023 NGTD -Echo pending -De-escalated to penicillin G IV on 06/11/23--- patient weighs about 600 pounds.Marland Kitchen ???.  Appropriate antibiotic dose for her weight -ID pharmacist and ID physician input requested from Sharin Mons and Dr. Danelle Earthly respectively  2)Hyponatremia--- due to dehydration hydrate  3)Hypomagnesemia/hypophosphatemia--replaced  4)DM2--A1c 8.6 reflecting uncontrolled DM with hyperglycemia PTA -Continue Semglee Use Novolog/Humalog Sliding scale insulin with Accu-Cheks/Fingersticks as ordered   5)Morbid Obesity- -Low calorie diet, portion control and increase physical activity discussed with patient -Body mass index is 83.68 kg/m.  6) chronic lymphedema--- bilateral lower extremity extensive chronic lymphedema -Patient declines referral to lymphedema clinic  7))Generalized weakness and deconditioning/ambulatory dysfunction--  Ambulating around the room  8) candidal intertrigo--erythematous rash with satellite lesions under skin folds /pannus and inframammary areas -Topical  nystatin with triamcinolone requested  Status is: Inpatient   Disposition: The patient is from: Home              Anticipated d/c is to: Home              Anticipated d/c date is: 2 days              Patient currently is not medically stable to d/c. Barriers: Not Clinically Stable-   Code Status :  -  Code Status: Full Code   Family Communication:   (patient is alert, awake and coherent)  Discussed with Husband at bedside DVT Prophylaxis  :   - SCDs SCDs Start: 06/10/23 0146   Lab Results  Component Value Date   PLT 162 06/12/2023   Inpatient Medications  Scheduled Meds:  busPIRone  10 mg Oral TID   enoxaparin (LOVENOX) injection  0.5 mg/kg Subcutaneous Q24H   feeding supplement (GLUCERNA SHAKE)  237 mL Oral TID BM   insulin aspart  0-15 Units Subcutaneous TID WC   insulin aspart  0-5 Units Subcutaneous QHS   insulin glargine-yfgn  15 Units Subcutaneous BID   multivitamin with minerals  1 tablet Oral Daily   nystatin   Topical TID   nystatin-triamcinolone   Topical BID   phosphorus  500 mg Oral TID   Continuous Infusions:  pencillin G potassium IV 4 Million Units (06/12/23 1545)   PRN Meds:.acetaminophen **OR** acetaminophen, ondansetron **OR** ondansetron (ZOFRAN) IV   Anti-infectives (From admission, onward)    Start     Dose/Rate Route Frequency Ordered Stop   06/18/23 0300  vancomycin (VANCOCIN) IVPB 1000 mg/200 mL premix  Status:  Discontinued       Placed in "Followed by" Linked Group   1,000 mg 200 mL/hr over 60 Minutes Intravenous Every 12 hours 06/10/23 1334 06/10/23 1410   06/11/23 1600  penicillin G potassium 4 Million Units in dextrose 5 % 250 mL IVPB        4 Million Units 250 mL/hr over 60 Minutes Intravenous Every 4 hours 06/11/23 1216     06/11/23 0300  vancomycin (VANCOCIN) IVPB 1000 mg/200 mL premix  Status:  Discontinued       Placed in "And" Linked Group   1,000 mg 200 mL/hr over 60 Minutes Intravenous Every 12 hours 06/10/23 1410 06/11/23  1149   06/11/23 0200  vancomycin (VANCOCIN) IVPB 1000 mg/200 mL premix  Status:  Discontinued       Placed in "Followed by" Linked Group   1,000 mg 200 mL/hr over 60 Minutes Intravenous Every 12 hours 06/10/23 1334 06/10/23 1410   06/11/23 0200  vancomycin (VANCOCIN) IVPB 1000 mg/200 mL premix  Status:  Discontinued       Placed in "And" Linked Group   1,000 mg 200 mL/hr over 60 Minutes Intravenous Every 12 hours 06/10/23 1410 06/11/23 1149   06/10/23 1430  vancomycin (VANCOREADY) IVPB 1250 mg/250 mL       Placed in "Followed by" Linked Group   1,250 mg 166.7 mL/hr over 90 Minutes Intravenous  Once 06/10/23 1213 06/10/23 1718   06/10/23 1300  vancomycin (VANCOREADY) IVPB 1250 mg/250 mL       Placed in "Followed by" Linked Group   1,250 mg 166.7 mL/hr over 90 Minutes Intravenous  Once 06/10/23 1213 06/10/23 1534   06/09/23 2030  cefTRIAXone (ROCEPHIN) 2 g in sodium chloride 0.9 % 100 mL IVPB  Status:  Discontinued        2 g 200 mL/hr over 30 Minutes Intravenous Every 24 hours 06/09/23 2016 06/11/23 1216       Subjective: [name] today has No chest pain,   -At bedside, questions answered No fever  Or chills  No nausea, vomiting, diarrhea   Objective: Vitals:   06/11/23 0434 06/11/23 1330 06/12/23 0413 06/12/23 1430  BP: 108/63 (!) 141/78 (!) 154/80 (!) 115/53  Pulse: 90 91 87 91  Resp: 20 (!) 21    Temp: 98.6 F (37 C) 98 F (36.7 C) (!) 97.5 F (36.4 C) 97.6 F (36.4 C)  TempSrc: Oral Oral Axillary Oral  SpO2: 97% 95% 95% 95%  Weight:      Height:        Intake/Output Summary (Last 24 hours) at 06/12/2023 1559 Last data filed at 06/12/2023 1230 Gross per 24 hour  Intake 980 ml  Output --  Net 980 ml   Filed Weights   06/09/23 1601 06/10/23 0058  Weight: (!) 259.9 kg (!) 272.2 kg   Physical Exam Gen:- Awake Alert, morbidly obese, in no acute distress HEENT:- [place].AT, No sclera icterus Neck-Supple Neck,No JVD,.  Lungs-  CTAB , fair symmetrical air  movement CV- S1, S2 normal, regular  Abd-  +ve B.Sounds, Abd Soft, No tenderness, significant truncal adiposity/abdominal pannus   Extremity - pedal pulses present ,Improving Lymphedema of both lower extremities, Rt leg with erythema, warmth, swelling and tenderness persist, no open or draining wounds Skin--improving erythematous rash with satellite lesions under skin folds /pannus and inframammary areas Psych-affect is appropriate, oriented x3 Neuro-generalized weakness, no new focal deficits, no tremors  Data Reviewed: I have personally reviewed following labs and imaging studies  CBC: Recent Labs  Lab 06/09/23 2027 06/10/23 0449 06/11/23 0443 06/12/23 0514  WBC 12.7* 11.4* 5.4 5.0  NEUTROABS 11.5*  --   --   --   HGB 13.0 11.2*  10.5* 10.4*  HCT 41.9 35.5* 34.3* 33.6*  MCV 89.1 89.2 89.3 88.4  PLT 145* 143* 134* 162   Basic Metabolic Panel: Recent Labs  Lab 06/09/23 2144 06/10/23 0449 06/11/23 0443 06/12/23 0514  NA 133* 133*  --  136  K 4.3 3.8  --  3.6  CL 100 102  --  105  CO2 22 20*  --  22  GLUCOSE 250* 253*  --  220*  BUN 12 13  --  13  CREATININE 1.00 1.07* 0.98 0.90  CALCIUM 8.6* 8.1*  --  8.3*  MG  --  1.6*  --   --   PHOS  --  2.1*  --   --    GFR: Estimated Creatinine Clearance: 190.7 mL/min (by C-G formula based on SCr of 0.9 mg/dL). Liver Function Tests: Recent Labs  Lab 06/09/23 2144 06/10/23 0449  AST 19 15  ALT 21 18  ALKPHOS 72 63  BILITOT 1.0 1.1  PROT 7.8 7.0  ALBUMIN 3.3* 3.0*   Recent Results (from the past 240 hour(s))  Resp panel by RT-PCR (RSV, Flu A&B, Covid) Anterior Nasal Swab     Status: None   Collection Time: 06/09/23  8:27 PM   Specimen: Anterior Nasal Swab  Result Value Ref Range Status   SARS Coronavirus 2 by RT PCR NEGATIVE NEGATIVE Final    Comment: (NOTE) SARS-CoV-2 target nucleic acids are NOT DETECTED.  The SARS-CoV-2 RNA is generally detectable in upper respiratory specimens during the acute phase of infection.  The lowest concentration of SARS-CoV-2 viral copies this assay can detect is 138 copies/mL. A negative result does not preclude SARS-Cov-2 infection and should not be used as the sole basis for treatment or other patient management decisions. A negative result may occur with  improper specimen collection/handling, submission of specimen other than nasopharyngeal swab, presence of viral mutation(s) within the areas targeted by this assay, and inadequate number of viral copies(<138 copies/mL). A negative result must be combined with clinical observations, patient history, and epidemiological information. The expected result is Negative.  Fact Sheet for Patients:  BloggerCourse.com  Fact Sheet for Healthcare Providers:  SeriousBroker.it  This test is no t yet approved or cleared by the [place] FDA and  has been authorized for detection and/or diagnosis of SARS-CoV-2 by FDA under an Emergency Use Authorization (EUA). This EUA will remain  in effect (meaning this test can be used) for the duration of the COVID-19 declaration under Section 564(b)(1) of the Act, 21 U.S.C.section 360bbb-3(b)(1), unless the authorization is terminated  or revoked sooner.       Influenza A by PCR NEGATIVE NEGATIVE Final   Influenza B by PCR NEGATIVE NEGATIVE Final    Comment: (NOTE) The Xpert Xpress SARS-CoV-2/FLU/RSV plus assay is intended as an aid in the diagnosis of influenza from Nasopharyngeal swab specimens and should not be used as a sole basis for treatment. Nasal washings and aspirates are unacceptable for Xpert Xpress SARS-CoV-2/FLU/RSV testing.  Fact Sheet for Patients: BloggerCourse.com  Fact Sheet for Healthcare Providers: SeriousBroker.it  This test is not yet approved or cleared by the [place] FDA and has been authorized for detection and/or diagnosis of SARS-CoV-2 by FDA  under an Emergency Use Authorization (EUA). This EUA will remain in effect (meaning this test can be used) for the duration of the COVID-19 declaration under Section 564(b)(1) of the Act, 21 U.S.C. section 360bbb-3(b)(1), unless the authorization is terminated or revoked.     Resp Syncytial Virus  by PCR NEGATIVE NEGATIVE Final    Comment: (NOTE) Fact Sheet for Patients: BloggerCourse.com  Fact Sheet for Healthcare Providers: SeriousBroker.it  This test is not yet approved or cleared by the [place] FDA and has been authorized for detection and/or diagnosis of SARS-CoV-2 by FDA under an Emergency Use Authorization (EUA). This EUA will remain in effect (meaning this test can be used) for the duration of the COVID-19 declaration under Section 564(b)(1) of the Act, 21 U.S.C. section 360bbb-3(b)(1), unless the authorization is terminated or revoked.  Performed at [facility], [address], [place], [place] [address]   Blood Culture (routine x 2)     Status: Abnormal   Collection Time: 06/09/23  8:27 PM   Specimen: Left Antecubital; Blood  Result Value Ref Range Status   Specimen Description   Final    LEFT ANTECUBITAL Performed at [facility], [address] [place], [place] [address]    Special Requests   Final    BOTTLES DRAWN AEROBIC AND ANAEROBIC Blood Culture adequate volume Performed at [facility], [address] [place], [place] [address]    Culture  Setup Time   Final    GRAM POSITIVE COCCI AEROBIC BOTTLE ONLY Gram Stain Report Called to,Read Back By and Verified With: LEONARD T @ 1152 ON [id number] BY HENDERSON L Performed at [facility], [address], [place], [place] [address]    Culture (A)  Final    GROUP B STREP(S.AGALACTIAE)ISOLATED SUSCEPTIBILITIES PERFORMED ON PREVIOUS CULTURE WITHIN THE LAST 5 DAYS. Performed at [facility], [address], [place], [place] [address]    Report Status  06/12/2023 FINAL  Final  Blood Culture (routine x 2)     Status: Abnormal   Collection Time: 06/09/23  8:37 PM   Specimen: Left Antecubital; Blood  Result Value Ref Range Status   Specimen Description   Final    LEFT ANTECUBITAL Performed at [facility], [address] [place], [place] [address]    Special Requests   Final    BOTTLES DRAWN AEROBIC AND ANAEROBIC Blood Culture adequate volume Performed at [facility], [address] [place], [place] [address]    Culture  Setup Time   Final    GRAM POSITIVE COCCI IN BOTH AEROBIC AND ANAEROBIC BOTTLES Gram Stain Report Called to,Read Back By and Verified With: LEONARD TORY @ 1152 ON [id number] BY HENDERSON L CRITICAL RESULT CALLED TO, READ BACK BY AND VERIFIED WITH: RN Danna Hefty [id number] AT 1525 BY EC Performed at [facility], [address] [place], [place] [address]    Culture GROUP B STREP(S.AGALACTIAE)ISOLATED (A)  Final   Report Status 06/12/2023 FINAL  Final   Organism ID, Bacteria GROUP B STREP(S.AGALACTIAE)ISOLATED  Final      Susceptibility   Group b strep(s.agalactiae)isolated - MIC*    CLINDAMYCIN <=0.25 SENSITIVE Sensitive     AMPICILLIN <=0.25 SENSITIVE Sensitive     VANCOMYCIN 0.5 SENSITIVE Sensitive     CEFTRIAXONE <=0.12 SENSITIVE Sensitive     PENICILLIN <=0.06 SENSITIVE Sensitive     * GROUP B STREP(S.AGALACTIAE)ISOLATED  Blood Culture ID Panel (Reflexed)     Status: Abnormal   Collection Time: 06/09/23  8:37 PM  Result Value Ref Range Status   Enterococcus faecalis NOT DETECTED NOT DETECTED Final   Enterococcus Faecium NOT DETECTED NOT DETECTED Final   Listeria monocytogenes NOT DETECTED NOT DETECTED Final   Staphylococcus species NOT DETECTED NOT DETECTED Final   Staphylococcus aureus (BCID) NOT DETECTED NOT  DETECTED Final   Staphylococcus epidermidis NOT DETECTED NOT DETECTED Final   Staphylococcus lugdunensis NOT DETECTED NOT DETECTED Final   Streptococcus species DETECTED (A) NOT DETECTED Final     Comment: CRITICAL RESULT CALLED TO, READ BACK BY AND VERIFIED WITH: RN Danna Hefty [id number] AT 1725 BY EC    Streptococcus agalactiae DETECTED (A) NOT DETECTED Final    Comment: CRITICAL RESULT CALLED TO, READ BACK BY AND VERIFIED WITH: RN Danna Hefty [id number] AT 1725 BY EC    Streptococcus pneumoniae NOT DETECTED NOT DETECTED Final   Streptococcus pyogenes NOT DETECTED NOT DETECTED Final   A.calcoaceticus-baumannii NOT DETECTED NOT DETECTED Final   Bacteroides fragilis NOT DETECTED NOT DETECTED Final   Enterobacterales NOT DETECTED NOT DETECTED Final   Enterobacter cloacae complex NOT DETECTED NOT DETECTED Final   Escherichia coli NOT DETECTED NOT DETECTED Final   Klebsiella aerogenes NOT DETECTED NOT DETECTED Final   Klebsiella oxytoca NOT DETECTED NOT DETECTED Final   Klebsiella pneumoniae NOT DETECTED NOT DETECTED Final   Proteus species NOT DETECTED NOT DETECTED Final   Salmonella species NOT DETECTED NOT DETECTED Final   Serratia marcescens NOT DETECTED NOT DETECTED Final   Haemophilus influenzae NOT DETECTED NOT DETECTED Final   Neisseria meningitidis NOT DETECTED NOT DETECTED Final   Pseudomonas aeruginosa NOT DETECTED NOT DETECTED Final   Stenotrophomonas maltophilia NOT DETECTED NOT DETECTED Final   Candida albicans NOT DETECTED NOT DETECTED Final   Candida auris NOT DETECTED NOT DETECTED Final   Candida glabrata NOT DETECTED NOT DETECTED Final   Candida krusei NOT DETECTED NOT DETECTED Final   Candida parapsilosis NOT DETECTED NOT DETECTED Final   Candida tropicalis NOT DETECTED NOT DETECTED Final   Cryptococcus neoformans/gattii NOT DETECTED NOT DETECTED Final    Comment: Performed at [facility], [address] [place], [place] [address]  Culture, blood (Routine X 2) w Reflex to ID Panel     Status: None (Preliminary result)   Collection Time: 06/11/23 10:51 AM   Specimen: Right Antecubital; Blood  Result Value Ref Range Status   Specimen Description   Final     RIGHT ANTECUBITAL BOTTLES DRAWN AEROBIC AND ANAEROBIC   Special Requests Blood Culture adequate volume  Final   Culture   Final    NO GROWTH < 24 HOURS Performed at [facility], [address] [place], [place] [address]    Report Status PENDING  Incomplete  Culture, blood (Routine X 2) w Reflex to ID Panel     Status: None (Preliminary result)   Collection Time: 06/11/23 10:51 AM   Specimen: Left Antecubital; Blood  Result Value Ref Range Status   Specimen Description   Final    LEFT ANTECUBITAL BOTTLES DRAWN AEROBIC AND ANAEROBIC   Special Requests Blood Culture adequate volume  Final   Culture   Final    NO GROWTH < 24 HOURS Performed at [facility], [address] [place], [place] [address]    Report Status PENDING  Incomplete    Scheduled Meds:  busPIRone  10 mg Oral TID   enoxaparin (LOVENOX) injection  0.5 mg/kg Subcutaneous Q24H   feeding supplement (GLUCERNA SHAKE)  237 mL Oral TID BM   insulin aspart  0-15 Units Subcutaneous TID WC   insulin aspart  0-5 Units Subcutaneous QHS   insulin glargine-yfgn  15 Units Subcutaneous BID   multivitamin with minerals  1 tablet Oral Daily   nystatin   Topical TID   nystatin-triamcinolone   Topical BID  phosphorus  500 mg Oral TID   Continuous Infusions:  pencillin G potassium IV 4 Million Units (06/12/23 1545)    LOS: 3 days   Shon Hale M.D on 06/12/2023 at 3:59 PM  Go to [email or web address] - for contact info  [facility] - Office  [phone]  If 7PM-7AM, please contact night-coverage [email or web address] 06/12/2023, 3:59 PM

## 2023-06-12 NOTE — Plan of Care (Signed)

## 2023-06-13 DIAGNOSIS — A419 Sepsis, unspecified organism: Secondary | ICD-10-CM | POA: Diagnosis not present

## 2023-06-13 DIAGNOSIS — L0390 Cellulitis, unspecified: Secondary | ICD-10-CM | POA: Diagnosis not present

## 2023-06-13 LAB — CBC
HCT: 36.6 % (ref 36.0–46.0)
Hemoglobin: 10.8 g/dL — ABNORMAL LOW (ref 12.0–15.0)
MCH: 26.8 pg (ref 26.0–34.0)
MCHC: 29.5 g/dL — ABNORMAL LOW (ref 30.0–36.0)
MCV: 90.8 fL (ref 80.0–100.0)
Platelets: 186 10*3/uL (ref 150–400)
RBC: 4.03 MIL/uL (ref 3.87–5.11)
RDW: 14.8 % (ref 11.5–15.5)
WBC: 5.2 10*3/uL (ref 4.0–10.5)
nRBC: 0 % (ref 0.0–0.2)

## 2023-06-13 LAB — GLUCOSE, CAPILLARY
Glucose-Capillary: 178 mg/dL — ABNORMAL HIGH (ref 70–99)
Glucose-Capillary: 194 mg/dL — ABNORMAL HIGH (ref 70–99)
Glucose-Capillary: 226 mg/dL — ABNORMAL HIGH (ref 70–99)
Glucose-Capillary: 194 mg/dL — ABNORMAL HIGH (ref 70–99)

## 2023-06-13 LAB — RENAL FUNCTION PANEL
Albumin: 2.7 g/dL — ABNORMAL LOW (ref 3.5–5.0)
Anion gap: 9 (ref 5–15)
BUN: 11 mg/dL (ref 6–20)
CO2: 25 mmol/L (ref 22–32)
Calcium: 8.4 mg/dL — ABNORMAL LOW (ref 8.9–10.3)
Chloride: 103 mmol/L (ref 98–111)
Creatinine, Ser: 0.94 mg/dL (ref 0.44–1.00)
GFR, Estimated: >60 mL/min (ref >60)
Glucose, Bld: 184 mg/dL — ABNORMAL HIGH (ref 70–99)
Phosphorus: 4.1 mg/dL (ref 2.5–4.6)
Potassium: 3.7 mmol/L (ref 3.5–5.1)
Sodium: 137 mmol/L (ref 135–145)

## 2023-06-13 LAB — MAGNESIUM: Magnesium: 2.1 mg/dL (ref 1.7–2.4)

## 2023-06-13 MED ORDER — ONDANSETRON HCL 4 MG/2ML IJ SOLN
4.0000 mg | Freq: Four times a day (QID) | INTRAMUSCULAR | Status: DC | PRN
  Administered 2023-06-13 – 2023-06-15 (×6): 4 mg via INTRAVENOUS
  Filled 2023-06-13 (×6): qty 2

## 2023-06-13 MED ORDER — METOCLOPRAMIDE HCL 10 MG PO TABS
10.0000 mg | ORAL_TABLET | Freq: Four times a day (QID) | ORAL | Status: DC | PRN
  Administered 2023-06-13: 10 mg via ORAL
  Filled 2023-06-13 (×2): qty 1

## 2023-06-13 NOTE — Progress Notes (Signed)
PROGRESS NOTE   Brittany [name], is a 42 y.o. female, DOB - 07-18-1980, ZOX:[id number]  Admit date - 06/09/2023   Admitting Physician Frankey Shown, DO  Outpatient Primary MD for the patient is Lianne Moris, PA-C  LOS - 4  Chief Complaint  Patient presents with   Leg Pain       Brief Narrative:  42 y.o. female with medical history significant of morbid obesity, chronic bilateral lower extremity lymphedema, with recurrent cellulitis, DM2, with diabetic gastroparesis, and GERD admitted on 06/09/23 with group B strep bacteremia secondary to right lower extremity cellulitis in the setting of significant underlying lymphedema   -Assessment and Plan: 1)Sepsis group B strep bacteremia--secondary to right lower extremity cellulitis in the setting of significant underlying lymphedema WBC 12.7 >>11.4>>5.4 >> 5.0 -Treated with Vanco and Rocephin 06/13/23 -Initial blood cultures from 06/09/2023 with group B strep -Repeat blood cultures on 06/11/2023 NGTD -Echo poor quality due to body habitus, EF 70 to 75%, no obvious vegetations -De-escalated to penicillin G IV on 06/11/23--- patient weighs about 600 pounds.Marland Kitchen ???.  Appropriate antibiotic dose for her weight -ID pharmacist and ID physician input requested from Sharin Mons and Dr. Danelle Earthly respectively -Possible discharge home on 06/14/2023 if blood cultures from 06/11/2023 remains negative  2)Hyponatremia--- due to dehydration  -Sodium normalized with hydration  3)Hypomagnesemia/hypophosphatemia-normalized with replacement  4)DM2--A1c 8.6 reflecting uncontrolled DM with hyperglycemia PTA -Continue Semglee Use Novolog/Humalog Sliding scale insulin with Accu-Cheks/Fingersticks as ordered   5)Morbid Obesity- -Low calorie diet, portion control and increase physical activity discussed with patient -Body mass index is 83.68 kg/m.  6)Chronic Lymphedema--- bilateral lower extremity extensive chronic lymphedema -Patient has repeatedly  declined referral to lymphedema clinic -After further conversation with patient and her husband patient is now agreeable to have referral to lymphedema clinic with Virgina Organ (physical therapist with lymphedema certification at [facility]) -Patient has some lymphedema pumps at home but they are more than 44 years old and she has not used them in over a decade  7))Generalized weakness and deconditioning/ambulatory dysfunction--  Ambulating around the room  8)Candidal intertrigo--erythematous rash with satellite lesions under skin folds /pannus and inframammary areas -Topical nystatin with triamcinolone requested  Status is: Inpatient   Disposition: The patient is from: Home              Anticipated d/c is to: Home              Anticipated d/c date is: 1 day              Patient currently is not medically stable to d/c. Barriers: Not Clinically Stable-   Code Status :  -  Code Status: Full Code   Family Communication:   (patient is alert, awake and coherent)  Discussed with Husband at bedside DVT Prophylaxis  :   - SCDs SCDs Start: 06/10/23 0146   Lab Results  Component Value Date   PLT 186 06/13/2023   Inpatient Medications  Scheduled Meds:  busPIRone  10 mg Oral TID   enoxaparin (LOVENOX) injection  0.5 mg/kg Subcutaneous Q24H   feeding supplement (GLUCERNA SHAKE)  237 mL Oral TID BM   insulin aspart  0-15 Units Subcutaneous TID WC   insulin aspart  0-5 Units Subcutaneous QHS   insulin glargine-yfgn  15 Units Subcutaneous BID   multivitamin with minerals  1 tablet Oral Daily   nystatin   Topical TID   nystatin-triamcinolone   Topical BID   phosphorus  500 mg Oral TID  Continuous Infusions:  pencillin G potassium IV 4 Million Units (06/13/23 1541)   PRN Meds:.acetaminophen **OR** acetaminophen, metoCLOPramide, oxyCODONE   Anti-infectives (From admission, onward)    Start     Dose/Rate Route Frequency Ordered Stop   06/18/23 0300  vancomycin (VANCOCIN)  IVPB 1000 mg/200 mL premix  Status:  Discontinued       Placed in "Followed by" Linked Group   1,000 mg 200 mL/hr over 60 Minutes Intravenous Every 12 hours 06/10/23 1334 06/10/23 1410   06/11/23 1600  penicillin G potassium 4 Million Units in dextrose 5 % 250 mL IVPB        4 Million Units 250 mL/hr over 60 Minutes Intravenous Every 4 hours 06/11/23 1216     06/11/23 0300  vancomycin (VANCOCIN) IVPB 1000 mg/200 mL premix  Status:  Discontinued       Placed in "And" Linked Group   1,000 mg 200 mL/hr over 60 Minutes Intravenous Every 12 hours 06/10/23 1410 06/11/23 1149   06/11/23 0200  vancomycin (VANCOCIN) IVPB 1000 mg/200 mL premix  Status:  Discontinued       Placed in "Followed by" Linked Group   1,000 mg 200 mL/hr over 60 Minutes Intravenous Every 12 hours 06/10/23 1334 06/10/23 1410   06/11/23 0200  vancomycin (VANCOCIN) IVPB 1000 mg/200 mL premix  Status:  Discontinued       Placed in "And" Linked Group   1,000 mg 200 mL/hr over 60 Minutes Intravenous Every 12 hours 06/10/23 1410 06/11/23 1149   06/10/23 1430  vancomycin (VANCOREADY) IVPB 1250 mg/250 mL       Placed in "Followed by" Linked Group   1,250 mg 166.7 mL/hr over 90 Minutes Intravenous  Once 06/10/23 1213 06/10/23 1718   06/10/23 1300  vancomycin (VANCOREADY) IVPB 1250 mg/250 mL       Placed in "Followed by" Linked Group   1,250 mg 166.7 mL/hr over 90 Minutes Intravenous  Once 06/10/23 1213 06/10/23 1534   06/09/23 2030  cefTRIAXone (ROCEPHIN) 2 g in sodium chloride 0.9 % 100 mL IVPB  Status:  Discontinued        2 g 200 mL/hr over 30 Minutes Intravenous Every 24 hours 06/09/23 2016 06/11/23 1216       Subjective: [name] today has No chest pain,    -Had BM No fever  Or chills  - right leg pain improving -Husband is at bedside, questions answered  Objective: Vitals:   06/12/23 1430 06/12/23 2049 06/13/23 0402 06/13/23 1315  BP: (!) 115/53 127/72 127/68 136/78  Pulse: 91 82 81 81  Resp:  20     Temp: 97.6 F (36.4 C) 98.5 F (36.9 C)  98.4 F (36.9 C)  TempSrc: Oral   Oral  SpO2: 95% 99% 98% 97%  Weight:      Height:        Intake/Output Summary (Last 24 hours) at 06/13/2023 1627 Last data filed at 06/13/2023 1300 Gross per 24 hour  Intake 840 ml  Output --  Net 840 ml   Filed Weights   06/09/23 1601 06/10/23 0058  Weight: (!) 259.9 kg (!) 272.2 kg   Physical Exam Gen:- Awake Alert, morbidly obese, in no acute distress HEENT:- [place].AT, No sclera icterus Neck-Supple Neck,No JVD,.  Lungs-  CTAB , fair symmetrical air movement CV- S1, S2 normal, regular  Abd-  +ve B.Sounds, Abd Soft, No tenderness, significant truncal adiposity/abdominal pannus   Extremity - pedal pulses present ,Improving Lymphedema of both lower extremities, improving  rt leg with erythema, warmth, swelling and tenderness persist, no open or draining wounds Skin--improving erythematous rash with satellite lesions under skin folds /pannus and inframammary areas Psych-affect is appropriate, oriented x3 Neuro-generalized weakness, no new focal deficits, no tremors  Data Reviewed: I have personally reviewed following labs and imaging studies  CBC: Recent Labs  Lab 06/09/23 2027 06/10/23 0449 06/11/23 0443 06/12/23 0514 06/13/23 0427  WBC 12.7* 11.4* 5.4 5.0 5.2  NEUTROABS 11.5*  --   --   --   --   HGB 13.0 11.2* 10.5* 10.4* 10.8*  HCT 41.9 35.5* 34.3* 33.6* 36.6  MCV 89.1 89.2 89.3 88.4 90.8  PLT 145* 143* 134* 162 186   Basic Metabolic Panel: Recent Labs  Lab 06/09/23 2144 06/10/23 0449 06/11/23 0443 06/12/23 0514 06/13/23 0427  NA 133* 133*  --  136 137  K 4.3 3.8  --  3.6 3.7  CL 100 102  --  105 103  CO2 22 20*  --  22 25  GLUCOSE 250* 253*  --  220* 184*  BUN 12 13  --  13 11  CREATININE 1.00 1.07* 0.98 0.90 0.94  CALCIUM 8.6* 8.1*  --  8.3* 8.4*  MG  --  1.6*  --   --  2.1  PHOS  --  2.1*  --   --  4.1   GFR: Estimated Creatinine Clearance: 182.5 mL/min (by C-G formula  based on SCr of 0.94 mg/dL). Liver Function Tests: Recent Labs  Lab 06/09/23 2144 06/10/23 0449 06/13/23 0427  AST 19 15  --   ALT 21 18  --   ALKPHOS 72 63  --   BILITOT 1.0 1.1  --   PROT 7.8 7.0  --   ALBUMIN 3.3* 3.0* 2.7*   Recent Results (from the past 240 hour(s))  Resp panel by RT-PCR (RSV, Flu A&B, Covid) Anterior Nasal Swab     Status: None   Collection Time: 06/09/23  8:27 PM   Specimen: Anterior Nasal Swab  Result Value Ref Range Status   SARS Coronavirus 2 by RT PCR NEGATIVE NEGATIVE Final    Comment: (NOTE) SARS-CoV-2 target nucleic acids are NOT DETECTED.  The SARS-CoV-2 RNA is generally detectable in upper respiratory specimens during the acute phase of infection. The lowest concentration of SARS-CoV-2 viral copies this assay can detect is 138 copies/mL. A negative result does not preclude SARS-Cov-2 infection and should not be used as the sole basis for treatment or other patient management decisions. A negative result may occur with  improper specimen collection/handling, submission of specimen other than nasopharyngeal swab, presence of viral mutation(s) within the areas targeted by this assay, and inadequate number of viral copies(<138 copies/mL). A negative result must be combined with clinical observations, patient history, and epidemiological information. The expected result is Negative.  Fact Sheet for Patients:  BloggerCourse.com  Fact Sheet for Healthcare Providers:  SeriousBroker.it  This test is no t yet approved or cleared by the [place] FDA and  has been authorized for detection and/or diagnosis of SARS-CoV-2 by FDA under an Emergency Use Authorization (EUA). This EUA will remain  in effect (meaning this test can be used) for the duration of the COVID-19 declaration under Section 564(b)(1) of the Act, 21 U.S.C.section 360bbb-3(b)(1), unless the authorization is terminated  or revoked  sooner.       Influenza A by PCR NEGATIVE NEGATIVE Final   Influenza B by PCR NEGATIVE NEGATIVE Final    Comment: (NOTE) The Xpert  Xpress SARS-CoV-2/FLU/RSV plus assay is intended as an aid in the diagnosis of influenza from Nasopharyngeal swab specimens and should not be used as a sole basis for treatment. Nasal washings and aspirates are unacceptable for Xpert Xpress SARS-CoV-2/FLU/RSV testing.  Fact Sheet for Patients: BloggerCourse.com  Fact Sheet for Healthcare Providers: SeriousBroker.it  This test is not yet approved or cleared by the [place] FDA and has been authorized for detection and/or diagnosis of SARS-CoV-2 by FDA under an Emergency Use Authorization (EUA). This EUA will remain in effect (meaning this test can be used) for the duration of the COVID-19 declaration under Section 564(b)(1) of the Act, 21 U.S.C. section 360bbb-3(b)(1), unless the authorization is terminated or revoked.     Resp Syncytial Virus by PCR NEGATIVE NEGATIVE Final    Comment: (NOTE) Fact Sheet for Patients: BloggerCourse.com  Fact Sheet for Healthcare Providers: SeriousBroker.it  This test is not yet approved or cleared by the [place] FDA and has been authorized for detection and/or diagnosis of SARS-CoV-2 by FDA under an Emergency Use Authorization (EUA). This EUA will remain in effect (meaning this test can be used) for the duration of the COVID-19 declaration under Section 564(b)(1) of the Act, 21 U.S.C. section 360bbb-3(b)(1), unless the authorization is terminated or revoked.  Performed at [facility], [address] [place], [place] [address]   Blood Culture (routine x 2)     Status: Abnormal   Collection Time: 06/09/23  8:27 PM   Specimen: Left Antecubital; Blood  Result Value Ref Range Status   Specimen Description   Final    LEFT ANTECUBITAL Performed at [facility], [address], [place], [place] [address]    Special Requests   Final    BOTTLES DRAWN AEROBIC AND ANAEROBIC Blood Culture adequate volume Performed at [facility], [address], [place], [place] [address]    Culture  Setup Time   Final    GRAM POSITIVE COCCI AEROBIC BOTTLE ONLY Gram Stain Report Called to,Read Back By and Verified With: LEONARD T @ 1152 ON [id number] BY HENDERSON L Performed at [facility], [address], [place], [place] [address]    Culture (A)  Final    GROUP B STREP(S.AGALACTIAE)ISOLATED SUSCEPTIBILITIES PERFORMED ON PREVIOUS CULTURE WITHIN THE LAST 5 DAYS. Performed at [facility], [address], [place], [place] [address]    Report Status 06/12/2023 FINAL  Final  Blood Culture (routine x 2)     Status: Abnormal   Collection Time: 06/09/23  8:37 PM   Specimen: Left Antecubital; Blood  Result Value Ref Range Status   Specimen Description   Final    LEFT ANTECUBITAL Performed at [facility], [address], [place], [place] [address]    Special Requests   Final    BOTTLES DRAWN AEROBIC AND ANAEROBIC Blood Culture adequate volume Performed at [facility], [address], [place], [place] [address]    Culture  Setup Time   Final    GRAM POSITIVE COCCI IN BOTH AEROBIC AND ANAEROBIC BOTTLES Gram Stain Report Called to,Read Back By and Verified With: LEONARD TORY @ 1152 ON [id number] BY HENDERSON L CRITICAL RESULT CALLED TO, READ BACK BY AND VERIFIED WITH: RN Danna Hefty [id number] AT 1525 BY EC Performed at [facility], [address], [place], [place] [address]    Culture GROUP B STREP(S.AGALACTIAE)ISOLATED (A)  Final   Report Status 06/12/2023 FINAL  Final   Organism ID, Bacteria GROUP B STREP(S.AGALACTIAE)ISOLATED  Final  Susceptibility   Group b strep(s.agalactiae)isolated - MIC*    CLINDAMYCIN <=0.25 SENSITIVE Sensitive     AMPICILLIN <=0.25 SENSITIVE Sensitive     VANCOMYCIN 0.5 SENSITIVE Sensitive     CEFTRIAXONE <=0.12  SENSITIVE Sensitive     PENICILLIN <=0.06 SENSITIVE Sensitive     * GROUP B STREP(S.AGALACTIAE)ISOLATED  Blood Culture ID Panel (Reflexed)     Status: Abnormal   Collection Time: 06/09/23  8:37 PM  Result Value Ref Range Status   Enterococcus faecalis NOT DETECTED NOT DETECTED Final   Enterococcus Faecium NOT DETECTED NOT DETECTED Final   Listeria monocytogenes NOT DETECTED NOT DETECTED Final   Staphylococcus species NOT DETECTED NOT DETECTED Final   Staphylococcus aureus (BCID) NOT DETECTED NOT DETECTED Final   Staphylococcus epidermidis NOT DETECTED NOT DETECTED Final   Staphylococcus lugdunensis NOT DETECTED NOT DETECTED Final   Streptococcus species DETECTED (A) NOT DETECTED Final    Comment: CRITICAL RESULT CALLED TO, READ BACK BY AND VERIFIED WITH: RN Danna Hefty [id number] AT 1725 BY EC    Streptococcus agalactiae DETECTED (A) NOT DETECTED Final    Comment: CRITICAL RESULT CALLED TO, READ BACK BY AND VERIFIED WITH: RN Danna Hefty [id number] AT 1725 BY EC    Streptococcus pneumoniae NOT DETECTED NOT DETECTED Final   Streptococcus pyogenes NOT DETECTED NOT DETECTED Final   A.calcoaceticus-baumannii NOT DETECTED NOT DETECTED Final   Bacteroides fragilis NOT DETECTED NOT DETECTED Final   Enterobacterales NOT DETECTED NOT DETECTED Final   Enterobacter cloacae complex NOT DETECTED NOT DETECTED Final   Escherichia coli NOT DETECTED NOT DETECTED Final   Klebsiella aerogenes NOT DETECTED NOT DETECTED Final   Klebsiella oxytoca NOT DETECTED NOT DETECTED Final   Klebsiella pneumoniae NOT DETECTED NOT DETECTED Final   Proteus species NOT DETECTED NOT DETECTED Final   Salmonella species NOT DETECTED NOT DETECTED Final   Serratia marcescens NOT DETECTED NOT DETECTED Final   Haemophilus influenzae NOT DETECTED NOT DETECTED Final   Neisseria meningitidis NOT DETECTED NOT DETECTED Final   Pseudomonas aeruginosa NOT DETECTED NOT DETECTED Final   Stenotrophomonas maltophilia NOT DETECTED NOT  DETECTED Final   Candida albicans NOT DETECTED NOT DETECTED Final   Candida auris NOT DETECTED NOT DETECTED Final   Candida glabrata NOT DETECTED NOT DETECTED Final   Candida krusei NOT DETECTED NOT DETECTED Final   Candida parapsilosis NOT DETECTED NOT DETECTED Final   Candida tropicalis NOT DETECTED NOT DETECTED Final   Cryptococcus neoformans/gattii NOT DETECTED NOT DETECTED Final    Comment: Performed at [facility], [address] [place], [place] [address]  Culture, blood (Routine X 2) w Reflex to ID Panel     Status: None (Preliminary result)   Collection Time: 06/11/23 10:51 AM   Specimen: Right Antecubital; Blood  Result Value Ref Range Status   Specimen Description   Final    RIGHT ANTECUBITAL BOTTLES DRAWN AEROBIC AND ANAEROBIC   Special Requests Blood Culture adequate volume  Final   Culture   Final    NO GROWTH 2 DAYS Performed at [facility], [address] [place], [place] [address]    Report Status PENDING  Incomplete  Culture, blood (Routine X 2) w Reflex to ID Panel     Status: None (Preliminary result)   Collection Time: 06/11/23 10:51 AM   Specimen: Left Antecubital; Blood  Result Value Ref Range Status   Specimen Description   Final    LEFT ANTECUBITAL BOTTLES DRAWN AEROBIC AND ANAEROBIC   Special Requests Blood  Culture adequate volume  Final   Culture   Final    NO GROWTH 2 DAYS Performed at [facility], [address], [place], [place] [address]    Report Status PENDING  Incomplete    Scheduled Meds:  busPIRone  10 mg Oral TID   enoxaparin (LOVENOX) injection  0.5 mg/kg Subcutaneous Q24H   feeding supplement (GLUCERNA SHAKE)  237 mL Oral TID BM   insulin aspart  0-15 Units Subcutaneous TID WC   insulin aspart  0-5 Units Subcutaneous QHS   insulin glargine-yfgn  15 Units Subcutaneous BID   multivitamin with minerals  1 tablet Oral Daily   nystatin   Topical TID   nystatin-triamcinolone   Topical BID   phosphorus  500 mg Oral TID    Continuous Infusions:  pencillin G potassium IV 4 Million Units (06/13/23 1541)    LOS: 4 days   Shon Hale M.D on 06/13/2023 at 4:27 PM  Go to [email or web address] - for contact info  [facility] - Office  [phone]  If 7PM-7AM, please contact night-coverage [email or web address] 06/13/2023, 4:27 PM

## 2023-06-13 NOTE — Progress Notes (Signed)
Mobility Specialist Progress Note:    06/13/23 1600  Mobility  Activity  (BLE heel slides x10, ankle pumps x10)  Level of Assistance Independent  Assistive Device None  Range of Motion/Exercises Active;Right leg;Left leg  Activity Response Tolerated well  Mobility Referral Yes  $Mobility charge 1 Mobility  Mobility Specialist Start Time (ACUTE ONLY) 1525  Mobility Specialist Stop Time (ACUTE ONLY) 1540  Mobility Specialist Time Calculation (min) (ACUTE ONLY) 15 min   Pt received in bed, husband in room. Agreeable to heel slides and ankle pumps, not agreeabole to ambulation. Tolerated well, asx throughout. Left pt supine, RN in room. All needs met.   Lawerance Bach Mobility Specialist Please contact via Special educational needs teacher or  Rehab office at [phone]

## 2023-06-14 ENCOUNTER — Other Ambulatory Visit: Payer: Self-pay

## 2023-06-14 DIAGNOSIS — A419 Sepsis, unspecified organism: Secondary | ICD-10-CM | POA: Diagnosis not present

## 2023-06-14 DIAGNOSIS — L0390 Cellulitis, unspecified: Secondary | ICD-10-CM | POA: Diagnosis not present

## 2023-06-14 LAB — GLUCOSE, CAPILLARY
Glucose-Capillary: 179 mg/dL — ABNORMAL HIGH (ref 70–99)
Glucose-Capillary: 180 mg/dL — ABNORMAL HIGH (ref 70–99)
Glucose-Capillary: 195 mg/dL — ABNORMAL HIGH (ref 70–99)
Glucose-Capillary: 194 mg/dL — ABNORMAL HIGH (ref 70–99)

## 2023-06-14 MED ORDER — PENICILLIN G POTASSIUM IV (FOR PTA / DISCHARGE USE ONLY): 24.0000 10*6.[IU] | INTRAVENOUS | 0 refills | Status: DC

## 2023-06-14 MED ORDER — NYSTATIN 100000 UNIT/GM EX POWD: Freq: Two times a day (BID) | CUTANEOUS | 0 refills | Status: DC

## 2023-06-14 MED ORDER — NYSTATIN 100000 UNIT/GM EX POWD: Freq: Two times a day (BID) | CUTANEOUS | Status: DC

## 2023-06-14 MED ORDER — BUSPIRONE HCL 5 MG PO TABS: 5.0000 mg | ORAL_TABLET | Freq: Two times a day (BID) | ORAL | Status: DC

## 2023-06-14 MED ORDER — SODIUM CHLORIDE 0.9% FLUSH
10.0000 mL | Freq: Two times a day (BID) | INTRAVENOUS | Status: DC
  Administered 2023-06-14 – 2023-06-15 (×2): 10 mL

## 2023-06-14 MED ORDER — CHLORHEXIDINE GLUCONATE CLOTH 2 % EX PADS
6.0000 | MEDICATED_PAD | Freq: Every day | CUTANEOUS | Status: DC
  Administered 2023-06-14 – 2023-06-15 (×2): 6 via TOPICAL

## 2023-06-14 MED ORDER — LACTINEX PO CHEW: 1.0000 | CHEWABLE_TABLET | Freq: Three times a day (TID) | ORAL | 0 refills | Status: AC

## 2023-06-14 MED ORDER — SODIUM CHLORIDE 0.9% FLUSH: 10.0000 mL | INTRAVENOUS | Status: DC | PRN

## 2023-06-14 NOTE — Plan of Care (Signed)
  Problem: Health Behavior/Discharge Planning: Goal: Ability to manage health-related needs will improve Outcome: Progressing   Problem: Clinical Measurements: Goal: Ability to maintain clinical measurements within normal limits will improve Outcome: Progressing Goal: Will remain free from infection Outcome: Progressing Goal: Diagnostic test results will improve Outcome: Progressing   Problem: Pain Management: Goal: General experience of comfort will improve Outcome: Progressing   Problem: Safety: Goal: Ability to remain free from injury will improve Outcome: Progressing   Problem: Skin Integrity: Goal: Risk for impaired skin integrity will decrease Outcome: Progressing   Problem: Tissue Perfusion: Goal: Adequacy of tissue perfusion will improve Outcome: Progressing

## 2023-06-14 NOTE — Discharge Summary (Signed)
Physician Discharge Summary   Patient: Brittany [name] MRN: [id number] DOB: 1980/10/29  Admit date:     06/09/2023  Discharge date: 06/15/2023  Discharge Physician: Kendell Bane   PCP: Lianne Moris, PA-C   Recommendations at discharge:    Follow-up with home health, continue IV antibiotics for 2 weeks, follow-up with PCP and infectious disease Dr. Thedore Mins in 1-2 weeks  Discharge Diagnoses: Principal Problem:   Sepsis due to cellulitis [facility]) Active Problems:   Morbid obesity with BMI of 70 and over, adult [facility])   Uncontrolled type 2 diabetes mellitus with hyperglycemia, with long-term current use of insulin (HCC)   Lymphedema   Hypoalbuminemia due to protein-calorie malnutrition (HCC)  Resolved Problems:   * No resolved hospital problems. *  Brief Narrative:  42 y.o. female with medical history significant of morbid obesity, chronic bilateral lower extremity lymphedema, with recurrent cellulitis, DM2, with diabetic gastroparesis, and GERD admitted on 06/09/23 with group B strep bacteremia secondary to right lower extremity cellulitis in the setting of significant underlying lymphedema       1)Sepsis group B strep bacteremia--secondary to right lower extremity cellulitis in the setting of significant underlying lymphedema WBC 12.7 >>11.4>>5.4 >> 5.0 -Treated with Vanco and Rocephin   -Initial blood cultures from 06/09/2023 with group B strep -Repeat blood cultures on 06/11/2023 NGTD -Echo poor quality due to body habitus, EF 70 to 75%, no obvious vegetations -De-escalated to penicillin G IV on 06/11/23--- patient weighs about 600 pounds..  Challenging appropriate antibiotic dose for her weight -ID pharmacist and ID physician input requested from Sharin Mons and Dr. Danelle Earthly respectively   Blood cultures from 06/11/2023 remains negative Pending central line placement ID Dr, Thedore Mins:  recommended 2 weeks of antibiotics 07/04/2023 - PICC was placed  07/05/2023: Patient  refusing IV antibiotics, questing PICC line to be removed Infusion nurse, discussed options with the patient--still declined-fluid and Rocephin  Cussed the case with Dr. Thedore Mins who recommended: Oritavancin today to finish the course- 3 hour infusion x 1 before discharge  Pros and cons of refusing IV antibiotics for treatment of her bacteremia was discussed with the patient and her partner at bedside.  She has expressed understanding.     2)Hyponatremia--- due to dehydration  -Sodium normalized with hydration   3)Hypomagnesemia/hypophosphatemia-normalized with replacement   4)DM2--A1c 8.6 reflecting uncontrolled DM with hyperglycemia PTA -Continue Semglee Use Novolog/Humalog Sliding scale insulin with Accu-Cheks/Fingersticks as ordered    5)Morbid Obesity- -Low calorie diet, portion control and increase physical activity discussed with patient -Body mass index is 83.68 kg/m.   6)Chronic Lymphedema--- bilateral lower extremity extensive chronic lymphedema -Patient has repeatedly declined referral to lymphedema clinic -After further conversation with patient and her husband patient is now agreeable to have referral to lymphedema clinic with Virgina Organ (physical therapist with lymphedema certification at [facility]) -Patient has some lymphedema pumps at home but they are more than 44 years old and she has not used them in over a decade   7))Generalized weakness and deconditioning/ambulatory dysfunction--  Ambulating around the room   8)Candidal intertrigo--erythematous rash with satellite lesions under skin folds /pannus and inframammary areas -Topical nystatin with triamcinolone requested   Status is: Inpatient    Disposition: The patient is from: Home              Anticipated d/c is to: Home    Patient has requested removal of PICC line, refused continuous IV antibiotics recommended per ID.  Disposition: Home health Diet recommendation:  Discharge  Diet Orders (From admission, onward)     Start     Ordered   06/14/23 0000  Diet - low sodium heart healthy        06/14/23 1251           Cardiac and Carb modified diet DISCHARGE MEDICATION: Allergies as of 06/14/2023       Reactions   Paxil [paroxetine Hcl] Other (See Comments)   Suicidal thoughts   Morphine And Codeine Other (See Comments)   "it just freaks me out" Just morphine   Latex Itching, Swelling, Rash   Lipitor [atorvastatin] Rash   Sulfa Antibiotics Nausea And Vomiting, Rash        Medication List     TAKE these medications    acetaminophen 500 MG tablet Commonly known as: TYLENOL Take 1,000 mg by mouth every 8 (eight) hours as needed for moderate pain, headache or fever.   busPIRone 5 MG tablet Commonly known as: BUSPAR Take 5 mg by mouth 2 (two) times daily.   colchicine 0.6 MG tablet Take 0.6 mg by mouth 2 (two) times daily as needed (gout).   gabapentin 600 MG tablet Commonly known as: NEURONTIN Take 1 tablet (600 mg total) by mouth 3 (three) times daily. What changed: when to take this   ibuprofen 200 MG tablet Commonly known as: ADVIL Take 400 mg by mouth every 6 (six) hours as needed for mild pain (pain score 1-3).   lactobacillus acidophilus & bulgar chewable tablet Chew 1 tablet by mouth 3 (three) times daily with meals for 14 days.   NovoLOG FlexPen 100 UNIT/ML FlexPen Generic drug: insulin aspart Inject 5-28 Units into the skin 3 (three) times daily with meals as needed for high blood sugar.   ondansetron 8 MG disintegrating tablet Commonly known as: ZOFRAN-ODT Take 8 mg by mouth daily.   penicillin G IVPB Inject 24 Million Units into the vein daily for 10 days. As a continuous infusion. Indication:  Group B Strep bacteremia First Dose: Yes Last Day of Therapy:  06/24/23 Labs - Once weekly:  CBC/D and BMP, Labs - Once weekly: ESR and CRP Method of administration: Elastomeric (Continuous infusion) Method of administration  may be changed at the discretion of home infusion pharmacist based upon assessment of the patient and/or caregiver's ability to self-administer the medication ordered.   Semglee (yfgn) 100 UNIT/ML Pen Generic drug: insulin glargine-yfgn Inject 23-90 Units into the skin See admin instructions. Inject 23 units before breakfast and 90 units at bedtime.   SSD 1 % cream Generic drug: silver sulfADIAZINE Apply 1 Application topically 2 (two) times daily.   Victoza 18 MG/3ML Sopn Generic drug: liraglutide Inject 1.8 mg into the skin daily.               Discharge Care Instructions  (From admission, onward)           Start     Ordered   06/14/23 0000  Change dressing on IV access line weekly and PRN  (Home infusion instructions - [facility] )        06/14/23 1103   06/14/23 0000  Discharge wound care:       Comments: Per RN wound instructions Central line care per instructions   06/14/23 1251            Discharge Exam: Filed Weights   06/09/23 1601 06/10/23 0058  Weight: (!) 259.9 kg (!) 272.2 kg   Physical  Exam     General:  AAO x 3,  cooperative, no distress; significant truncal adiposity with Penuus   HEENT:  Normocephalic, PERRL, otherwise with in Normal limits   Neuro:  CNII-XII intact. , normal motor and sensation, reflexes intact   Lungs:   Clear to auscultation BL, Respirations unlabored,  No wheezes / crackles  Cardio:    S1/S2, RRR, No murmure, No Rubs or Gallops   Abdomen:  Soft, non-tender, bowel sounds active all four quadrants, no guarding or peritoneal signs.  Muscular  skeletal:  Limited exam -global generalized weaknesses - in bed, able to move all 4 extremities,   2+ pulses,  symmetric, lymphedema bilaterally, right lower leg edema erythema improving No open wounds  Skin:  Dry, warm to touch, negative for any Rashes,     Condition at discharge: good  The results of significant diagnostics from this hospitalization (including  imaging, microbiology, ancillary and laboratory) are listed below for reference.   Imaging Studies: [place] EKG SITE RITE  Result Date: 06/14/2023 If Site Rite image not attached, placement could not be confirmed due to current cardiac rhythm.  ECHOCARDIOGRAM COMPLETE  Result Date: 06/12/2023    ECHOCARDIOGRAM REPORT   Patient Name:   NAJAI JOLIVET Date of Exam: 06/12/2023 Medical Rec #:  [id number]        Height:       71.0 in Accession #:    [id number]       Weight:       600.0 lb Date of Birth:  06-Dec-1980        BSA:          3.363 m Patient Age:    42 years         BP:           154/80 mmHg Patient Gender: F                HR:           85 bpm. [facility] Procedure: 2D Echo, Cardiac Doppler, Color Doppler and Intracardiac            Opacification Agent Indications:    Bacteremia R78.81  History:        Patient has prior history of Echocardiogram examinations, most                 recent 01/18/2021. Risk Factors:Diabetes and Sleep Apnea. Morbid                 obesity (HCC), Sepsis due to cellulitis (HCC).  Sonographer:    Celesta Gentile RCS Referring Phys: [phone] COURAGE EMOKPAE IMPRESSIONS  1. Poor acoustic windoes Unable to assess valves well enough to rule out endocarditis. Would require TEE if clinically indicated.  2. Left ventricular ejection fraction, by estimation, is 70 to 75%. The left ventricle has hyperdynamic function. The left ventricle has no regional wall motion abnormalities. There is mild concentric left ventricular hypertrophy. Left ventricular diastolic parameters were normal.  3. Right ventricular systolic function is normal. The right ventricular size is normal.  4. The mitral valve is normal in structure. Trivial mitral valve regurgitation.  5. The aortic valve is normal in structure. Aortic valve regurgitation is not visualized. FINDINGS  Left Ventricle: Left ventricular ejection fraction, by estimation, is 70 to 75%. The left ventricle has hyperdynamic function. The  left ventricle has no regional wall motion abnormalities. Definity contrast agent was given IV to delineate the left ventricular endocardial borders.  The left ventricular internal cavity size was normal in size. There is mild concentric left ventricular hypertrophy. Left ventricular diastolic parameters were normal. Right Ventricle: The right ventricular size is normal. Right vetricular wall thickness was not assessed. Right ventricular systolic function is normal. Left Atrium: Left atrial size was normal in size. Right Atrium: Right atrial size was normal in size. Pericardium: There is no evidence of pericardial effusion. Mitral Valve: The mitral valve is normal in structure. Trivial mitral valve regurgitation. Tricuspid Valve: The tricuspid valve is normal in structure. Tricuspid valve regurgitation is trivial. Aortic Valve: The aortic valve is normal in structure. Aortic valve regurgitation is not visualized. Pulmonic Valve: The pulmonic valve was not well visualized. Pulmonic valve regurgitation is not visualized. Aorta: The aortic root is normal in size and structure. IAS/Shunts: No atrial level shunt detected by color flow Doppler.  LEFT VENTRICLE PLAX 2D LVIDd:         4.40 cm   Diastology LVIDs:         2.90 cm   LV e' medial:    8.16 cm/s LV PW:         1.20 cm   LV E/e' medial:  15.1 LV IVS:        1.20 cm   LV e' lateral:   14.10 cm/s LVOT diam:     2.00 cm   LV E/e' lateral: 8.7 LV SV:         64 LV SV Index:   19 LVOT Area:     3.14 cm  RIGHT VENTRICLE RV S prime:     17.80 cm/s TAPSE (M-mode): 2.4 cm LEFT ATRIUM           Index LA diam:      3.70 cm 1.10 cm/m LA Vol (A4C): 78.6 ml 23.37 ml/m  AORTIC VALVE LVOT Vmax:   105.00 cm/s LVOT Vmean:  74.800 cm/s LVOT VTI:    0.204 m  AORTA Ao Root diam: 3.70 cm MITRAL VALVE MV Area (PHT): 3.91 cm     SHUNTS MV Decel Time: 194 msec     Systemic VTI:  0.20 m MV E velocity: 123.00 cm/s  Systemic Diam: 2.00 cm MV A velocity: 110.00 cm/s MV E/A ratio:  1.12  Dietrich Pates MD Electronically signed by Dietrich Pates MD Signature Date/Time: 06/12/2023/5:22:37 PM    Final    DG Chest Portable 1 View  Result Date: 06/09/2023 CLINICAL DATA:  Cough, fever. EXAM: PORTABLE CHEST 1 VIEW COMPARISON:  January 16, 2021. FINDINGS: The heart size and mediastinal contours are within normal limits. Both lungs are clear. The visualized skeletal structures are unremarkable. IMPRESSION: No active disease. Electronically Signed   By: Lupita Raider M.D.   On: 06/09/2023 21:00    Microbiology: Results for orders placed or performed during the hospital encounter of 06/09/23  Resp panel by RT-PCR (RSV, Flu A&B, Covid) Anterior Nasal Swab     Status: None   Collection Time: 06/09/23  8:27 PM   Specimen: Anterior Nasal Swab  Result Value Ref Range Status   SARS Coronavirus 2 by RT PCR NEGATIVE NEGATIVE Final    Comment: (NOTE) SARS-CoV-2 target nucleic acids are NOT DETECTED.  The SARS-CoV-2 RNA is generally detectable in upper respiratory specimens during the acute phase of infection. The lowest concentration of SARS-CoV-2 viral copies this assay can detect is 138 copies/mL. A negative result does not preclude SARS-Cov-2 infection and should not be used as the sole basis for treatment or other  patient management decisions. A negative result may occur with  improper specimen collection/handling, submission of specimen other than nasopharyngeal swab, presence of viral mutation(s) within the areas targeted by this assay, and inadequate number of viral copies(<138 copies/mL). A negative result must be combined with clinical observations, patient history, and epidemiological information. The expected result is Negative.  Fact Sheet for Patients:  BloggerCourse.com  Fact Sheet for Healthcare Providers:  SeriousBroker.it  This test is no t yet approved or cleared by the [place] FDA and  has been authorized for detection  and/or diagnosis of SARS-CoV-2 by FDA under an Emergency Use Authorization (EUA). This EUA will remain  in effect (meaning this test can be used) for the duration of the COVID-19 declaration under Section 564(b)(1) of the Act, 21 U.S.C.section 360bbb-3(b)(1), unless the authorization is terminated  or revoked sooner.       Influenza A by PCR NEGATIVE NEGATIVE Final   Influenza B by PCR NEGATIVE NEGATIVE Final    Comment: (NOTE) The Xpert Xpress SARS-CoV-2/FLU/RSV plus assay is intended as an aid in the diagnosis of influenza from Nasopharyngeal swab specimens and should not be used as a sole basis for treatment. Nasal washings and aspirates are unacceptable for Xpert Xpress SARS-CoV-2/FLU/RSV testing.  Fact Sheet for Patients: BloggerCourse.com  Fact Sheet for Healthcare Providers: SeriousBroker.it  This test is not yet approved or cleared by the [place] FDA and has been authorized for detection and/or diagnosis of SARS-CoV-2 by FDA under an Emergency Use Authorization (EUA). This EUA will remain in effect (meaning this test can be used) for the duration of the COVID-19 declaration under Section 564(b)(1) of the Act, 21 U.S.C. section 360bbb-3(b)(1), unless the authorization is terminated or revoked.     Resp Syncytial Virus by PCR NEGATIVE NEGATIVE Final    Comment: (NOTE) Fact Sheet for Patients: BloggerCourse.com  Fact Sheet for Healthcare Providers: SeriousBroker.it  This test is not yet approved or cleared by the [place] FDA and has been authorized for detection and/or diagnosis of SARS-CoV-2 by FDA under an Emergency Use Authorization (EUA). This EUA will remain in effect (meaning this test can be used) for the duration of the COVID-19 declaration under Section 564(b)(1) of the Act, 21 U.S.C. section 360bbb-3(b)(1), unless the authorization is terminated  or revoked.  Performed at [facility], [address], [place], [place] [address]   Blood Culture (routine x 2)     Status: Abnormal   Collection Time: 06/09/23  8:27 PM   Specimen: Left Antecubital; Blood  Result Value Ref Range Status   Specimen Description   Final    LEFT ANTECUBITAL Performed at [facility], [address], [place], [place] [address]    Special Requests   Final    BOTTLES DRAWN AEROBIC AND ANAEROBIC Blood Culture adequate volume Performed at [facility], [address], [place], [place] [address]    Culture  Setup Time   Final    GRAM POSITIVE COCCI AEROBIC BOTTLE ONLY Gram Stain Report Called to,Read Back By and Verified With: LEONARD T @ 1152 ON [id number] BY HENDERSON L Performed at [facility], [address] [place], [place] [address]    Culture (A)  Final    GROUP B STREP(S.AGALACTIAE)ISOLATED SUSCEPTIBILITIES PERFORMED ON PREVIOUS CULTURE WITHIN THE LAST 5 DAYS. Performed at [facility], [address] [place], [place] [address]    Report Status 06/12/2023 FINAL  Final  Blood Culture (routine x 2)     Status: Abnormal  Collection Time: 06/09/23  8:37 PM   Specimen: Left Antecubital; Blood  Result Value Ref Range Status   Specimen Description   Final    LEFT ANTECUBITAL Performed at [facility], [address] [place], [place] [address]    Special Requests   Final    BOTTLES DRAWN AEROBIC AND ANAEROBIC Blood Culture adequate volume Performed at [facility], [address] [place], [place] [address]    Culture  Setup Time   Final    GRAM POSITIVE COCCI IN BOTH AEROBIC AND ANAEROBIC BOTTLES Gram Stain Report Called to,Read Back By and Verified With: LEONARD TORY @ 1152 ON [id number] BY HENDERSON L CRITICAL RESULT CALLED TO, READ BACK BY AND VERIFIED WITH: RN Danna Hefty [id number] AT 1525 BY EC Performed at [facility], [address], [place], [place] [address]    Culture GROUP B STREP(S.AGALACTIAE)ISOLATED (A)  Final   Report  Status 06/12/2023 FINAL  Final   Organism ID, Bacteria GROUP B STREP(S.AGALACTIAE)ISOLATED  Final      Susceptibility   Group b strep(s.agalactiae)isolated - MIC*    CLINDAMYCIN <=0.25 SENSITIVE Sensitive     AMPICILLIN <=0.25 SENSITIVE Sensitive     VANCOMYCIN 0.5 SENSITIVE Sensitive     CEFTRIAXONE <=0.12 SENSITIVE Sensitive     PENICILLIN <=0.06 SENSITIVE Sensitive     * GROUP B STREP(S.AGALACTIAE)ISOLATED  Blood Culture ID Panel (Reflexed)     Status: Abnormal   Collection Time: 06/09/23  8:37 PM  Result Value Ref Range Status   Enterococcus faecalis NOT DETECTED NOT DETECTED Final   Enterococcus Faecium NOT DETECTED NOT DETECTED Final   Listeria monocytogenes NOT DETECTED NOT DETECTED Final   Staphylococcus species NOT DETECTED NOT DETECTED Final   Staphylococcus aureus (BCID) NOT DETECTED NOT DETECTED Final   Staphylococcus epidermidis NOT DETECTED NOT DETECTED Final   Staphylococcus lugdunensis NOT DETECTED NOT DETECTED Final   Streptococcus species DETECTED (A) NOT DETECTED Final    Comment: CRITICAL RESULT CALLED TO, READ BACK BY AND VERIFIED WITH: RN Danna Hefty [id number] AT 1725 BY EC    Streptococcus agalactiae DETECTED (A) NOT DETECTED Final    Comment: CRITICAL RESULT CALLED TO, READ BACK BY AND VERIFIED WITH: RN Danna Hefty [id number] AT 1725 BY EC    Streptococcus pneumoniae NOT DETECTED NOT DETECTED Final   Streptococcus pyogenes NOT DETECTED NOT DETECTED Final   A.calcoaceticus-baumannii NOT DETECTED NOT DETECTED Final   Bacteroides fragilis NOT DETECTED NOT DETECTED Final   Enterobacterales NOT DETECTED NOT DETECTED Final   Enterobacter cloacae complex NOT DETECTED NOT DETECTED Final   Escherichia coli NOT DETECTED NOT DETECTED Final   Klebsiella aerogenes NOT DETECTED NOT DETECTED Final   Klebsiella oxytoca NOT DETECTED NOT DETECTED Final   Klebsiella pneumoniae NOT DETECTED NOT DETECTED Final   Proteus species NOT DETECTED NOT DETECTED Final   Salmonella  species NOT DETECTED NOT DETECTED Final   Serratia marcescens NOT DETECTED NOT DETECTED Final   Haemophilus influenzae NOT DETECTED NOT DETECTED Final   Neisseria meningitidis NOT DETECTED NOT DETECTED Final   Pseudomonas aeruginosa NOT DETECTED NOT DETECTED Final   Stenotrophomonas maltophilia NOT DETECTED NOT DETECTED Final   Candida albicans NOT DETECTED NOT DETECTED Final   Candida auris NOT DETECTED NOT DETECTED Final   Candida glabrata NOT DETECTED NOT DETECTED Final   Candida krusei NOT DETECTED NOT DETECTED Final   Candida parapsilosis NOT DETECTED NOT DETECTED Final   Candida tropicalis NOT DETECTED NOT DETECTED Final   Cryptococcus neoformans/gattii NOT DETECTED  NOT DETECTED Final    Comment: Performed at [facility], [address], [place], [place] [address]  Culture, blood (Routine X 2) w Reflex to ID Panel     Status: None (Preliminary result)   Collection Time: 06/11/23 10:51 AM   Specimen: Right Antecubital; Blood  Result Value Ref Range Status   Specimen Description   Final    RIGHT ANTECUBITAL BOTTLES DRAWN AEROBIC AND ANAEROBIC   Special Requests Blood Culture adequate volume  Final   Culture   Final    NO GROWTH 3 DAYS Performed at [facility], [address], [place], [place] [address]    Report Status PENDING  Incomplete  Culture, blood (Routine X 2) w Reflex to ID Panel     Status: None (Preliminary result)   Collection Time: 06/11/23 10:51 AM   Specimen: Left Antecubital; Blood  Result Value Ref Range Status   Specimen Description   Final    LEFT ANTECUBITAL BOTTLES DRAWN AEROBIC AND ANAEROBIC   Special Requests Blood Culture adequate volume  Final   Culture   Final    NO GROWTH 3 DAYS Performed at [facility], [address] [place], [place] [address]    Report Status PENDING  Incomplete    Labs: CBC: Recent Labs  Lab 06/09/23 2027 06/10/23 0449 06/11/23 0443 06/12/23 0514 06/13/23 0427  WBC 12.7* 11.4* 5.4 5.0 5.2  NEUTROABS 11.5*   --   --   --   --   HGB 13.0 11.2* 10.5* 10.4* 10.8*  HCT 41.9 35.5* 34.3* 33.6* 36.6  MCV 89.1 89.2 89.3 88.4 90.8  PLT 145* 143* 134* 162 186   Basic Metabolic Panel: Recent Labs  Lab 06/09/23 2144 06/10/23 0449 06/11/23 0443 06/12/23 0514 06/13/23 0427  NA 133* 133*  --  136 137  K 4.3 3.8  --  3.6 3.7  CL 100 102  --  105 103  CO2 22 20*  --  22 25  GLUCOSE 250* 253*  --  220* 184*  BUN 12 13  --  13 11  CREATININE 1.00 1.07* 0.98 0.90 0.94  CALCIUM 8.6* 8.1*  --  8.3* 8.4*  MG  --  1.6*  --   --  2.1  PHOS  --  2.1*  --   --  4.1   Liver Function Tests: Recent Labs  Lab 06/09/23 2144 06/10/23 0449 06/13/23 0427  AST 19 15  --   ALT 21 18  --   ALKPHOS 72 63  --   BILITOT 1.0 1.1  --   PROT 7.8 7.0  --   ALBUMIN 3.3* 3.0* 2.7*   CBG: Recent Labs  Lab 06/13/23 1159 06/13/23 1606 06/13/23 2018 06/14/23 0741 06/14/23 1112  GLUCAP 194* 226* 194* 179* 180*    Discharge time spent: greater than 30 minutes.  Signed: Kendell Bane, MD [facility] 06/14/2023

## 2023-06-14 NOTE — Progress Notes (Signed)
Mobility Specialist Progress Note:    06/14/23 1410  Mobility  Activity Refused mobility   Pt refused mobility, stated "I have been up all day". All needs met.   Brittany [name] Mobility Specialist Please contact via Special educational needs teacher or  Rehab office at [phone]

## 2023-06-14 NOTE — TOC Progression Note (Addendum)
Transition of Care [facility]) - Progression Note    Patient Details  Name: Brittany [name] MRN: [id number] Date of Birth: 11-10-1980  Transition of Care [facility]) CM/SW Contact  V[place], [place] Phone Number: 06/14/2023, 11:04 AM  Clinical Narrative:    CSW updated that pt will need to go home on IV antibiotics. CSW spoke to [facility] with [employer] who has also been updated by ID of home needs. Pam will follow to assist with getting home IV infusions set up. HH RN will be set up by [employer] as well. TOC to follow.   Addendum 3pm: CSW spoke to [facility] with [employer]. She will arrive to hospital tomorrow morning to do education with pt in room. HH RN scheduled to be at pts home for first home infusion Friday. CSW updated RN and MD of this. TOC to follow.   Expected Discharge Plan: Home/Self Care Barriers to Discharge: Continued Medical Work up  Expected Discharge Plan and Services       Living arrangements for the past 2 months: Single Family Home                                       Social Determinants of Health (SDOH) Interventions SDOH Screenings   Food Insecurity: No Food Insecurity (06/10/2023)  Housing: Low Risk  (06/10/2023)  Transportation Needs: No Transportation Needs (06/10/2023)  Utilities: Not At Risk (06/10/2023)  Financial Resource Strain: Low Risk  (08/09/2021)   Received from [facility], [facility]  Tobacco Use: Low Risk  (06/09/2023)  Health Literacy: Low Risk  (08/09/2021)   Received from [facility], [facility]    Readmission Risk Interventions     No data to display

## 2023-06-14 NOTE — Progress Notes (Signed)
PROGRESS NOTE   Brittany [name], is a 42 y.o. female, DOB - 15-Jan-1981, WUJ:[id number]  Admit date - 06/09/2023   Admitting Physician Frankey Shown, DO  Outpatient Primary MD for the patient is Lianne Moris, PA-C  LOS - 5  Chief Complaint  Patient presents with   Leg Pain       Brief Narrative:  42 y.o. female with medical history significant of morbid obesity, chronic bilateral lower extremity lymphedema, with recurrent cellulitis, DM2, with diabetic gastroparesis, and GERD admitted on 06/09/23 with group B strep bacteremia secondary to right lower extremity cellulitis in the setting of significant underlying lymphedema    Subjective: The patient was seen and examined this morning, stable reporting improving erythema edema left leg  Where of bacteremia and bloodstream     -Assessment and Plan: 1)Sepsis group B strep bacteremia--secondary to right lower extremity cellulitis in the setting of significant underlying lymphedema WBC 12.7 >>11.4>>5.4 >> 5.0 -Treated with Vanco and Rocephin  -Initial blood cultures from 06/09/2023 with group B strep -Repeat blood cultures on 06/11/2023 NGTD -Echo poor quality due to body habitus, EF 70 to 75%, no obvious vegetations -De-escalated to penicillin G IV on 06/11/23--- patient weighs about 600 pounds..  Challenging appropriate antibiotic dose for her weight -ID pharmacist and ID physician input requested from Sharin Mons and Dr. Danelle Earthly respectively  -Possible discharge home on 06/14/2023 if blood cultures from 06/11/2023 remains negative Pending central line placement ID Dr, Thedore Mins:  recommended 2 weeks of antibiotics  2)Hyponatremia--- due to dehydration  -Sodium normalized with hydration  3)Hypomagnesemia/hypophosphatemia-normalized with replacement  4)DM2--A1c 8.6 reflecting uncontrolled DM with hyperglycemia PTA -Continue Semglee Use Novolog/Humalog Sliding scale insulin with Accu-Cheks/Fingersticks as ordered    5)Morbid Obesity- -Low calorie diet, portion control and increase physical activity discussed with patient -Body mass index is 83.68 kg/m.  6)Chronic Lymphedema--- bilateral lower extremity extensive chronic lymphedema -Patient has repeatedly declined referral to lymphedema clinic -After further conversation with patient and her husband patient is now agreeable to have referral to lymphedema clinic with Virgina Organ (physical therapist with lymphedema certification at [facility]) -Patient has some lymphedema pumps at home but they are more than 44 years old and she has not used them in over a decade  7))Generalized weakness and deconditioning/ambulatory dysfunction--  Ambulating around the room  8)Candidal intertrigo--erythematous rash with satellite lesions under skin folds /pannus and inframammary areas -Topical nystatin with triamcinolone requested  Status is: Inpatient   Disposition: The patient is from: Home              Anticipated d/c is to: Home              Anticipated d/c date is: 1 day              Pending central line placement, IV antibiotics Home health possible discharge today   Code Status :  -  Code Status: Full Code   Family Communication:   (patient is alert, awake and coherent)  Discussed with Husband at bedside DVT Prophylaxis  :   - SCDs SCDs Start: 06/10/23 0146   Lab Results  Component Value Date   PLT 186 06/13/2023   Inpatient Medications  Scheduled Meds:  busPIRone  10 mg Oral TID   enoxaparin (LOVENOX) injection  0.5 mg/kg Subcutaneous Q24H   feeding supplement (GLUCERNA SHAKE)  237 mL Oral TID BM   insulin aspart  0-15 Units Subcutaneous TID WC   insulin aspart  0-5 Units Subcutaneous QHS  insulin glargine-yfgn  15 Units Subcutaneous BID   multivitamin with minerals  1 tablet Oral Daily   nystatin   Topical TID   nystatin-triamcinolone   Topical BID   phosphorus  500 mg Oral TID   Continuous Infusions:  pencillin G  potassium IV 4 Million Units (06/14/23 1202)   PRN Meds:.acetaminophen **OR** acetaminophen, ondansetron (ZOFRAN) IV, oxyCODONE   Anti-infectives (From admission, onward)    Start     Dose/Rate Route Frequency Ordered Stop   06/18/23 0300  vancomycin (VANCOCIN) IVPB 1000 mg/200 mL premix  Status:  Discontinued       Placed in "Followed by" Linked Group   1,000 mg 200 mL/hr over 60 Minutes Intravenous Every 12 hours 06/10/23 1334 06/10/23 1410   06/14/23 0000  penicillin G IVPB        24 Million Units Intravenous Every 24 hours 06/14/23 1103 06/24/23 2359   06/11/23 1600  penicillin G potassium 4 Million Units in dextrose 5 % 250 mL IVPB        4 Million Units 250 mL/hr over 60 Minutes Intravenous Every 4 hours 06/11/23 1216     06/11/23 0300  vancomycin (VANCOCIN) IVPB 1000 mg/200 mL premix  Status:  Discontinued       Placed in "And" Linked Group   1,000 mg 200 mL/hr over 60 Minutes Intravenous Every 12 hours 06/10/23 1410 06/11/23 1149   06/11/23 0200  vancomycin (VANCOCIN) IVPB 1000 mg/200 mL premix  Status:  Discontinued       Placed in "Followed by" Linked Group   1,000 mg 200 mL/hr over 60 Minutes Intravenous Every 12 hours 06/10/23 1334 06/10/23 1410   06/11/23 0200  vancomycin (VANCOCIN) IVPB 1000 mg/200 mL premix  Status:  Discontinued       Placed in "And" Linked Group   1,000 mg 200 mL/hr over 60 Minutes Intravenous Every 12 hours 06/10/23 1410 06/11/23 1149   06/10/23 1430  vancomycin (VANCOREADY) IVPB 1250 mg/250 mL       Placed in "Followed by" Linked Group   1,250 mg 166.7 mL/hr over 90 Minutes Intravenous  Once 06/10/23 1213 06/10/23 1718   06/10/23 1300  vancomycin (VANCOREADY) IVPB 1250 mg/250 mL       Placed in "Followed by" Linked Group   1,250 mg 166.7 mL/hr over 90 Minutes Intravenous  Once 06/10/23 1213 06/10/23 1534   06/09/23 2030  cefTRIAXone (ROCEPHIN) 2 g in sodium chloride 0.9 % 100 mL IVPB  Status:  Discontinued        2 g 200 mL/hr over 30 Minutes  Intravenous Every 24 hours 06/09/23 2016 06/11/23 1216      Objective: Vitals:   06/13/23 0402 06/13/23 1315 06/13/23 2021 06/14/23 0318  BP: 127/68 136/78 (!) 145/70 (!) 169/99  Pulse: 81 81 84 88  Resp:   (!) 24 18  Temp:  98.4 F (36.9 C) 98.2 F (36.8 C) 98.2 F (36.8 C)  TempSrc:  Oral Oral   SpO2: 98% 97% 96% 95%  Weight:      Height:        Intake/Output Summary (Last 24 hours) at 06/14/2023 1244 Last data filed at 06/14/2023 0900 Gross per 24 hour  Intake 920 ml  Output --  Net 920 ml   Filed Weights   06/09/23 1601 06/10/23 0058  Weight: (!) 259.9 kg (!) 272.2 kg   Physical Exam   General:  AAO x 3,  cooperative, no distress; significant truncal adiposity with Penuus   HEENT:  Normocephalic, PERRL, otherwise with in Normal limits   Neuro:  CNII-XII intact. , normal motor and sensation, reflexes intact   Lungs:   Clear to auscultation BL, Respirations unlabored,  No wheezes / crackles  Cardio:    S1/S2, RRR, No murmure, No Rubs or Gallops   Abdomen:  Soft, non-tender, bowel sounds active all four quadrants, no guarding or peritoneal signs.  Muscular  skeletal:  Limited exam -global generalized weaknesses - in bed, able to move all 4 extremities,   2+ pulses,  symmetric, lymphedema bilaterally, right lower leg edema erythema improving No open wounds  Skin:  Dry, warm to touch, negative for any Rashes,  Wounds: Please see nursing documentation      Data Reviewed: I have personally reviewed following labs and imaging studies  CBC: Recent Labs  Lab 06/09/23 2027 06/10/23 0449 06/11/23 0443 06/12/23 0514 06/13/23 0427  WBC 12.7* 11.4* 5.4 5.0 5.2  NEUTROABS 11.5*  --   --   --   --   HGB 13.0 11.2* 10.5* 10.4* 10.8*  HCT 41.9 35.5* 34.3* 33.6* 36.6  MCV 89.1 89.2 89.3 88.4 90.8  PLT 145* 143* 134* 162 186   Basic Metabolic Panel: Recent Labs  Lab 06/09/23 2144 06/10/23 0449 06/11/23 0443 06/12/23 0514 06/13/23 0427  NA 133* 133*  --  136  137  K 4.3 3.8  --  3.6 3.7  CL 100 102  --  105 103  CO2 22 20*  --  22 25  GLUCOSE 250* 253*  --  220* 184*  BUN 12 13  --  13 11  CREATININE 1.00 1.07* 0.98 0.90 0.94  CALCIUM 8.6* 8.1*  --  8.3* 8.4*  MG  --  1.6*  --   --  2.1  PHOS  --  2.1*  --   --  4.1   GFR: Estimated Creatinine Clearance: 182.5 mL/min (by C-G formula based on SCr of 0.94 mg/dL). Liver Function Tests: Recent Labs  Lab 06/09/23 2144 06/10/23 0449 06/13/23 0427  AST 19 15  --   ALT 21 18  --   ALKPHOS 72 63  --   BILITOT 1.0 1.1  --   PROT 7.8 7.0  --   ALBUMIN 3.3* 3.0* 2.7*   Recent Results (from the past 240 hour(s))  Resp panel by RT-PCR (RSV, Flu A&B, Covid) Anterior Nasal Swab     Status: None   Collection Time: 06/09/23  8:27 PM   Specimen: Anterior Nasal Swab  Result Value Ref Range Status   SARS Coronavirus 2 by RT PCR NEGATIVE NEGATIVE Final    Comment: (NOTE) SARS-CoV-2 target nucleic acids are NOT DETECTED.  The SARS-CoV-2 RNA is generally detectable in upper respiratory specimens during the acute phase of infection. The lowest concentration of SARS-CoV-2 viral copies this assay can detect is 138 copies/mL. A negative result does not preclude SARS-Cov-2 infection and should not be used as the sole basis for treatment or other patient management decisions. A negative result may occur with  improper specimen collection/handling, submission of specimen other than nasopharyngeal swab, presence of viral mutation(s) within the areas targeted by this assay, and inadequate number of viral copies(<138 copies/mL). A negative result must be combined with clinical observations, patient history, and epidemiological information. The expected result is Negative.  Fact Sheet for Patients:  BloggerCourse.com  Fact Sheet for Healthcare Providers:  SeriousBroker.it  This test is no t yet approved or cleared by the [place] FDA and  has been  authorized for detection and/or diagnosis of SARS-CoV-2 by FDA under an Emergency Use Authorization (EUA). This EUA will remain  in effect (meaning this test can be used) for the duration of the COVID-19 declaration under Section 564(b)(1) of the Act, 21 U.S.C.section 360bbb-3(b)(1), unless the authorization is terminated  or revoked sooner.       Influenza A by PCR NEGATIVE NEGATIVE Final   Influenza B by PCR NEGATIVE NEGATIVE Final    Comment: (NOTE) The Xpert Xpress SARS-CoV-2/FLU/RSV plus assay is intended as an aid in the diagnosis of influenza from Nasopharyngeal swab specimens and should not be used as a sole basis for treatment. Nasal washings and aspirates are unacceptable for Xpert Xpress SARS-CoV-2/FLU/RSV testing.  Fact Sheet for Patients: BloggerCourse.com  Fact Sheet for Healthcare Providers: SeriousBroker.it  This test is not yet approved or cleared by the [place] FDA and has been authorized for detection and/or diagnosis of SARS-CoV-2 by FDA under an Emergency Use Authorization (EUA). This EUA will remain in effect (meaning this test can be used) for the duration of the COVID-19 declaration under Section 564(b)(1) of the Act, 21 U.S.C. section 360bbb-3(b)(1), unless the authorization is terminated or revoked.     Resp Syncytial Virus by PCR NEGATIVE NEGATIVE Final    Comment: (NOTE) Fact Sheet for Patients: BloggerCourse.com  Fact Sheet for Healthcare Providers: SeriousBroker.it  This test is not yet approved or cleared by the [place] FDA and has been authorized for detection and/or diagnosis of SARS-CoV-2 by FDA under an Emergency Use Authorization (EUA). This EUA will remain in effect (meaning this test can be used) for the duration of the COVID-19 declaration under Section 564(b)(1) of the Act, 21 U.S.C. section 360bbb-3(b)(1), unless the  authorization is terminated or revoked.  Performed at [facility], [address], [place], [place] [address]   Blood Culture (routine x 2)     Status: Abnormal   Collection Time: 06/09/23  8:27 PM   Specimen: Left Antecubital; Blood  Result Value Ref Range Status   Specimen Description   Final    LEFT ANTECUBITAL Performed at [facility], [address] [place], [place] [address]    Special Requests   Final    BOTTLES DRAWN AEROBIC AND ANAEROBIC Blood Culture adequate volume Performed at [facility], [address] [place], [place] [address]    Culture  Setup Time   Final    GRAM POSITIVE COCCI AEROBIC BOTTLE ONLY Gram Stain Report Called to,Read Back By and Verified With: LEONARD T @ 1152 ON [id number] BY HENDERSON L Performed at [facility], [address] [place], [place] [address]    Culture (A)  Final    GROUP B STREP(S.AGALACTIAE)ISOLATED SUSCEPTIBILITIES PERFORMED ON PREVIOUS CULTURE WITHIN THE LAST 5 DAYS. Performed at [facility], [address], [place], [place] [address]    Report Status 06/12/2023 FINAL  Final  Blood Culture (routine x 2)     Status: Abnormal   Collection Time: 06/09/23  8:37 PM   Specimen: Left Antecubital; Blood  Result Value Ref Range Status   Specimen Description   Final    LEFT ANTECUBITAL Performed at [facility], [address], [place], [place] [address]    Special Requests   Final    BOTTLES DRAWN AEROBIC AND ANAEROBIC Blood Culture adequate volume Performed at [facility], [address] [place], [place] [address]    Culture  Setup Time   Final    GRAM POSITIVE COCCI IN BOTH AEROBIC AND  ANAEROBIC BOTTLES Gram Stain Report Called to,Read Back By and Verified With: LEONARD TORY @ 1152 ON [id number] BY HENDERSON L CRITICAL RESULT CALLED TO, READ BACK BY AND VERIFIED WITH: RN Danna Hefty [id number] AT 1525 BY EC Performed at [facility], [address], [place], [place] [address]    Culture GROUP B  STREP(S.AGALACTIAE)ISOLATED (A)  Final   Report Status 06/12/2023 FINAL  Final   Organism ID, Bacteria GROUP B STREP(S.AGALACTIAE)ISOLATED  Final      Susceptibility   Group b strep(s.agalactiae)isolated - MIC*    CLINDAMYCIN <=0.25 SENSITIVE Sensitive     AMPICILLIN <=0.25 SENSITIVE Sensitive     VANCOMYCIN 0.5 SENSITIVE Sensitive     CEFTRIAXONE <=0.12 SENSITIVE Sensitive     PENICILLIN <=0.06 SENSITIVE Sensitive     * GROUP B STREP(S.AGALACTIAE)ISOLATED  Blood Culture ID Panel (Reflexed)     Status: Abnormal   Collection Time: 06/09/23  8:37 PM  Result Value Ref Range Status   Enterococcus faecalis NOT DETECTED NOT DETECTED Final   Enterococcus Faecium NOT DETECTED NOT DETECTED Final   Listeria monocytogenes NOT DETECTED NOT DETECTED Final   Staphylococcus species NOT DETECTED NOT DETECTED Final   Staphylococcus aureus (BCID) NOT DETECTED NOT DETECTED Final   Staphylococcus epidermidis NOT DETECTED NOT DETECTED Final   Staphylococcus lugdunensis NOT DETECTED NOT DETECTED Final   Streptococcus species DETECTED (A) NOT DETECTED Final    Comment: CRITICAL RESULT CALLED TO, READ BACK BY AND VERIFIED WITH: RN Danna Hefty [id number] AT 1725 BY EC    Streptococcus agalactiae DETECTED (A) NOT DETECTED Final    Comment: CRITICAL RESULT CALLED TO, READ BACK BY AND VERIFIED WITH: RN Danna Hefty [id number] AT 1725 BY EC    Streptococcus pneumoniae NOT DETECTED NOT DETECTED Final   Streptococcus pyogenes NOT DETECTED NOT DETECTED Final   A.calcoaceticus-baumannii NOT DETECTED NOT DETECTED Final   Bacteroides fragilis NOT DETECTED NOT DETECTED Final   Enterobacterales NOT DETECTED NOT DETECTED Final   Enterobacter cloacae complex NOT DETECTED NOT DETECTED Final   Escherichia coli NOT DETECTED NOT DETECTED Final   Klebsiella aerogenes NOT DETECTED NOT DETECTED Final   Klebsiella oxytoca NOT DETECTED NOT DETECTED Final   Klebsiella pneumoniae NOT DETECTED NOT DETECTED Final   Proteus species  NOT DETECTED NOT DETECTED Final   Salmonella species NOT DETECTED NOT DETECTED Final   Serratia marcescens NOT DETECTED NOT DETECTED Final   Haemophilus influenzae NOT DETECTED NOT DETECTED Final   Neisseria meningitidis NOT DETECTED NOT DETECTED Final   Pseudomonas aeruginosa NOT DETECTED NOT DETECTED Final   Stenotrophomonas maltophilia NOT DETECTED NOT DETECTED Final   Candida albicans NOT DETECTED NOT DETECTED Final   Candida auris NOT DETECTED NOT DETECTED Final   Candida glabrata NOT DETECTED NOT DETECTED Final   Candida krusei NOT DETECTED NOT DETECTED Final   Candida parapsilosis NOT DETECTED NOT DETECTED Final   Candida tropicalis NOT DETECTED NOT DETECTED Final   Cryptococcus neoformans/gattii NOT DETECTED NOT DETECTED Final    Comment: Performed at [facility], [address], [place], [place] [address]  Culture, blood (Routine X 2) w Reflex to ID Panel     Status: None (Preliminary result)   Collection Time: 06/11/23 10:51 AM   Specimen: Right Antecubital; Blood  Result Value Ref Range Status   Specimen Description   Final    RIGHT ANTECUBITAL BOTTLES DRAWN AEROBIC AND ANAEROBIC   Special Requests Blood Culture adequate volume  Final   Culture   Final  NO GROWTH 3 DAYS Performed at [facility], [address], [place], [place] [address]    Report Status PENDING  Incomplete  Culture, blood (Routine X 2) w Reflex to ID Panel     Status: None (Preliminary result)   Collection Time: 06/11/23 10:51 AM   Specimen: Left Antecubital; Blood  Result Value Ref Range Status   Specimen Description   Final    LEFT ANTECUBITAL BOTTLES DRAWN AEROBIC AND ANAEROBIC   Special Requests Blood Culture adequate volume  Final   Culture   Final    NO GROWTH 3 DAYS Performed at [facility], [address], [place], [place] [address]    Report Status PENDING  Incomplete    Scheduled Meds:  busPIRone  10 mg Oral TID   enoxaparin (LOVENOX) injection  0.5 mg/kg Subcutaneous Q24H    feeding supplement (GLUCERNA SHAKE)  237 mL Oral TID BM   insulin aspart  0-15 Units Subcutaneous TID WC   insulin aspart  0-5 Units Subcutaneous QHS   insulin glargine-yfgn  15 Units Subcutaneous BID   multivitamin with minerals  1 tablet Oral Daily   nystatin   Topical TID   nystatin-triamcinolone   Topical BID   phosphorus  500 mg Oral TID   Continuous Infusions:  pencillin G potassium IV 4 Million Units (06/14/23 1202)    LOS: 5 days   Kendell Bane M.D on 06/14/2023 at 12:44 PM  Go to [email or web address] - for contact info  [facility] - Office  [phone]  If 7PM-7AM, please contact night-coverage [email or web address] 06/14/2023, 12:44 PM

## 2023-06-14 NOTE — Progress Notes (Signed)
PHARMACY CONSULT NOTE FOR:  OUTPATIENT  PARENTERAL ANTIBIOTIC THERAPY (OPAT)  Indication: Group B Strep bacteremia Regimen: Pen G 24 million units every 24 hours as a continuous infusion  End date: 06/24/23  IV antibiotic discharge orders are pended. To discharging provider:  please sign these orders via discharge navigator,  Select New Orders & click on the button choice - Manage This Unsigned Work.     Thank you for allowing pharmacy to be a part of this patient's care.  Sharin Mons, PharmD, BCPS, BCIDP Infectious Diseases Clinical Pharmacist Phone: [phone] 06/14/2023, 11:01 AM

## 2023-06-14 NOTE — Progress Notes (Signed)
ID brief note 42 year old female admitted with: #Group B strep bacteremia # #BMI 83.68 - blood cultures 2/2 growing group B strep secondary to right lower extremity cellulitis in the setting of lymphedema - Continue penicillin due to limited bioavailability of p.o. antibiotics given weight of 272 kg - 2 weeks antibiotics from negative blood cultures on 12/1 - Wound care clinic for lymphedema - Will see in ID clinic when patient is on IV antibiotics OPAT ORDERS:  Diagnosis: Group B strep bacteremia secondary to cellulitis  Allergies  Allergen Reactions   Paxil [Paroxetine Hcl] Other (See Comments)    Suicidal thoughts   Morphine And Codeine Other (See Comments)    "it just freaks me out" Just morphine   Latex Itching, Swelling and Rash   Lipitor [Atorvastatin] Rash   Sulfa Antibiotics Nausea And Vomiting and Rash     Discharge antibiotics to be given via PICC line:  Per pharmacy protocol  Pen G 24 million units every 24 hours as a continuous infusion     Duration: 2 weeks End Date: 12/14  [facility] Care Per Protocol with [employer] Use: Home health RN for IV administration and teaching, line care and labs.    Labs weekly while on IV antibiotics: _x_ CBC with differential __ BMP **TWICE WEEKLY ON VANCOMYCIN  _x_ CMP __ CRP __ ESR __ Vancomycin trough TWICE WEEKLY __ CK  _x_ Please pull PIC at completion of IV antibiotics __ Please leave PIC in place until doctor has seen patient or been notified  Fax weekly labs to [phone]  Clinic Follow Up Appt: 12/17  @ [facility] with Dr/ Thedore Mins

## 2023-06-14 NOTE — Progress Notes (Signed)
Peripherally Inserted Central Catheter Placement  The IV Nurse has discussed with the patient and/or persons authorized to consent for the patient, the purpose of this procedure and the potential benefits and risks involved with this procedure.  The benefits include less needle sticks, lab draws from the catheter, and the patient may be discharged home with the catheter. Risks include, but not limited to, infection, bleeding, blood clot (thrombus formation), and puncture of an artery; nerve damage and irregular heartbeat and possibility to perform a PICC exchange if needed/ordered by physician.  Alternatives to this procedure were also discussed.  Bard Power PICC patient education guide, fact sheet on infection prevention and patient information card has been provided to patient /or left at bedside.    PICC Placement Documentation  PICC Single Lumen 06/14/23 Right Cephalic 45 cm 0 cm (Active)  Indication for Insertion or Continuance of Line Home intravenous therapies (PICC only) 06/14/23 1700  Exposed Catheter (cm) 0 cm 06/14/23 1700  Site Assessment Clean, Dry, Intact 06/14/23 1700  Line Status Flushed;Saline locked;Blood return noted 06/14/23 1700  Dressing Type Transparent;Securing device 06/14/23 1700  Dressing Status Antimicrobial disc in place;Clean, Dry, Intact 06/14/23 1700  Line Care Connections checked and tightened 06/14/23 1700  Line Adjustment (NICU/IV Team Only) No 06/14/23 1700  Dressing Intervention New dressing 06/14/23 1700  Dressing Change Due 06/21/23 06/14/23 1700       Timmothy Sours 06/14/2023, 5:23 PM

## 2023-06-14 NOTE — Plan of Care (Signed)
  Problem: Health Behavior/Discharge Planning: Goal: Ability to manage health-related needs will improve Outcome: Adequate for Discharge   Problem: Clinical Measurements: Goal: Ability to maintain clinical measurements within normal limits will improve Outcome: Adequate for Discharge Goal: Diagnostic test results will improve Outcome: Adequate for Discharge   Problem: Pain Management: Goal: General experience of comfort will improve Outcome: Adequate for Discharge

## 2023-06-15 DIAGNOSIS — A419 Sepsis, unspecified organism: Secondary | ICD-10-CM | POA: Diagnosis not present

## 2023-06-15 DIAGNOSIS — L0390 Cellulitis, unspecified: Secondary | ICD-10-CM | POA: Diagnosis not present

## 2023-06-15 LAB — GLUCOSE, CAPILLARY
Glucose-Capillary: 185 mg/dL — ABNORMAL HIGH (ref 70–99)
Glucose-Capillary: 199 mg/dL — ABNORMAL HIGH (ref 70–99)

## 2023-06-15 MED ORDER — ORITAVANCIN DIPHOSPHATE 400 MG IV SOLR
1200.0000 mg | Freq: Once | INTRAVENOUS | Status: AC
  Administered 2023-06-15: 1200 mg via INTRAVENOUS
  Filled 2023-06-15: qty 120

## 2023-06-15 NOTE — Plan of Care (Signed)
  Problem: Clinical Measurements: Goal: Ability to maintain clinical measurements within normal limits will improve Outcome: Progressing Goal: Will remain free from infection Outcome: Progressing Goal: Diagnostic test results will improve Outcome: Progressing   Problem: Pain Management: Goal: General experience of comfort will improve Outcome: Progressing   Problem: Safety: Goal: Ability to remain free from injury will improve Outcome: Progressing   Problem: Safety: Goal: Ability to remain free from injury will improve Outcome: Progressing   Problem: Skin Integrity: Goal: Risk for impaired skin integrity will decrease Outcome: Progressing

## 2023-06-15 NOTE — TOC Transition Note (Signed)
Transition of Care [facility]) - CM/SW Discharge Note   Patient Details  Name: Brittany [name] MRN: [id number] Date of Birth: 05/08/1981  Transition of Care [facility]) CM/SW Contact:  Elliot Gault, LCSW Phone Number: 06/15/2023, 10:46 AM   Clinical Narrative:     Received update from Pam at [facility] that pt now refusing plan for home IV anbx. MD has spoken to ID MD and plan is for 3 hour infusion here today and then dc home with no further IV therapy.  Pam with [facility] updated. No other TOC needs.  Final next level of care: Home/Self Care Barriers to Discharge: Barriers Resolved   Patient Goals and CMS Choice      Discharge Placement                         Discharge Plan and Services Additional resources added to the After Visit Summary for                                       Social Determinants of Health (SDOH) Interventions SDOH Screenings   Food Insecurity: No Food Insecurity (06/10/2023)  Housing: Low Risk  (06/10/2023)  Transportation Needs: No Transportation Needs (06/10/2023)  Utilities: Not At Risk (06/10/2023)  Financial Resource Strain: Low Risk  (08/09/2021)   Received from [facility], [facility]  Tobacco Use: Low Risk  (06/09/2023)  Health Literacy: Low Risk  (08/09/2021)   Received from [facility], [facility]     Readmission Risk Interventions     No data to display

## 2023-06-15 NOTE — Progress Notes (Signed)
Patient has discharge orders, discharge teaching given and no further questions at this time. PICC removed, patient tolerated well.

## 2023-06-16 LAB — CULTURE, BLOOD (ROUTINE X 2)
Culture: NO GROWTH
Culture: NO GROWTH
Special Requests: ADEQUATE
Special Requests: ADEQUATE

## 2023-06-27 ENCOUNTER — Inpatient Hospital Stay: Payer: Self-pay | Admitting: Internal Medicine

## 2023-08-23 ENCOUNTER — Other Ambulatory Visit: Payer: Self-pay | Admitting: Medical Genetics

## 2023-11-27 ENCOUNTER — Emergency Department (HOSPITAL_COMMUNITY)

## 2023-11-27 ENCOUNTER — Inpatient Hospital Stay (HOSPITAL_COMMUNITY)
Admission: EM | Admit: 2023-11-27 | Discharge: 2023-11-29 | DRG: 872 | Disposition: A | Discharge status: Home / Self Care | Attending: Internal Medicine | Admitting: Internal Medicine

## 2023-11-27 ENCOUNTER — Encounter (HOSPITAL_COMMUNITY): Payer: Self-pay | Admitting: Emergency Medicine

## 2023-11-27 ENCOUNTER — Observation Stay (HOSPITAL_COMMUNITY)

## 2023-11-27 ENCOUNTER — Other Ambulatory Visit: Payer: Self-pay

## 2023-11-27 DIAGNOSIS — F32A Depression, unspecified: Secondary | ICD-10-CM | POA: Diagnosis present

## 2023-11-27 DIAGNOSIS — E1143 Type 2 diabetes mellitus with diabetic autonomic (poly)neuropathy: Secondary | ICD-10-CM | POA: Diagnosis present

## 2023-11-27 DIAGNOSIS — E66813 Obesity, class 3: Secondary | ICD-10-CM | POA: Diagnosis not present

## 2023-11-27 DIAGNOSIS — Z8249 Family history of ischemic heart disease and other diseases of the circulatory system: Secondary | ICD-10-CM

## 2023-11-27 DIAGNOSIS — L03115 Cellulitis of right lower limb: Principal | ICD-10-CM | POA: Diagnosis present

## 2023-11-27 DIAGNOSIS — Z882 Allergy status to sulfonamides status: Secondary | ICD-10-CM

## 2023-11-27 DIAGNOSIS — R262 Difficulty in walking, not elsewhere classified: Secondary | ICD-10-CM | POA: Diagnosis present

## 2023-11-27 DIAGNOSIS — E1165 Type 2 diabetes mellitus with hyperglycemia: Secondary | ICD-10-CM | POA: Diagnosis present

## 2023-11-27 DIAGNOSIS — Z885 Allergy status to narcotic agent status: Secondary | ICD-10-CM

## 2023-11-27 DIAGNOSIS — G5602 Carpal tunnel syndrome, left upper limb: Secondary | ICD-10-CM | POA: Diagnosis present

## 2023-11-27 DIAGNOSIS — Z6841 Body Mass Index (BMI) 40.0 and over, adult: Secondary | ICD-10-CM

## 2023-11-27 DIAGNOSIS — E039 Hypothyroidism, unspecified: Secondary | ICD-10-CM | POA: Diagnosis present

## 2023-11-27 DIAGNOSIS — Z79899 Other long term (current) drug therapy: Secondary | ICD-10-CM

## 2023-11-27 DIAGNOSIS — K219 Gastro-esophageal reflux disease without esophagitis: Secondary | ICD-10-CM | POA: Diagnosis present

## 2023-11-27 DIAGNOSIS — Z8619 Personal history of other infectious and parasitic diseases: Secondary | ICD-10-CM

## 2023-11-27 DIAGNOSIS — I890 Lymphedema, not elsewhere classified: Secondary | ICD-10-CM | POA: Diagnosis present

## 2023-11-27 DIAGNOSIS — L0390 Cellulitis, unspecified: Secondary | ICD-10-CM | POA: Diagnosis present

## 2023-11-27 DIAGNOSIS — Z888 Allergy status to other drugs, medicaments and biological substances status: Secondary | ICD-10-CM

## 2023-11-27 DIAGNOSIS — F419 Anxiety disorder, unspecified: Secondary | ICD-10-CM | POA: Diagnosis present

## 2023-11-27 DIAGNOSIS — E876 Hypokalemia: Secondary | ICD-10-CM | POA: Diagnosis present

## 2023-11-27 DIAGNOSIS — Z794 Long term (current) use of insulin: Secondary | ICD-10-CM

## 2023-11-27 DIAGNOSIS — A419 Sepsis, unspecified organism: Principal | ICD-10-CM | POA: Diagnosis present

## 2023-11-27 DIAGNOSIS — Z833 Family history of diabetes mellitus: Secondary | ICD-10-CM

## 2023-11-27 DIAGNOSIS — Z91040 Latex allergy status: Secondary | ICD-10-CM

## 2023-11-27 DIAGNOSIS — G4733 Obstructive sleep apnea (adult) (pediatric): Secondary | ICD-10-CM | POA: Diagnosis present

## 2023-11-27 DIAGNOSIS — Z91199 Patient's noncompliance with other medical treatment and regimen due to unspecified reason: Secondary | ICD-10-CM

## 2023-11-27 DIAGNOSIS — L89890 Pressure ulcer of other site, unstageable: Secondary | ICD-10-CM | POA: Diagnosis present

## 2023-11-27 DIAGNOSIS — G8929 Other chronic pain: Secondary | ICD-10-CM | POA: Diagnosis present

## 2023-11-27 DIAGNOSIS — E871 Hypo-osmolality and hyponatremia: Secondary | ICD-10-CM | POA: Diagnosis present

## 2023-11-27 DIAGNOSIS — K3184 Gastroparesis: Secondary | ICD-10-CM | POA: Diagnosis present

## 2023-11-27 LAB — LACTIC ACID, PLASMA
Lactic Acid, Venous: 2.5 mmol/L (ref 0.5–1.9)
Lactic Acid, Venous: 2 mmol/L (ref 0.5–1.9)
Lactic Acid, Venous: 0.9 mmol/L (ref 0.5–1.9)

## 2023-11-27 LAB — CBC WITH DIFFERENTIAL/PLATELET
Abs Immature Granulocytes: 0.06 10*3/uL (ref 0.00–0.07)
Basophils Absolute: 0 10*3/uL (ref 0.0–0.1)
Basophils Relative: 0 %
Eosinophils Absolute: 0.1 10*3/uL (ref 0.0–0.5)
Eosinophils Relative: 1 %
HCT: 48.8 % — ABNORMAL HIGH (ref 36.0–46.0)
Hemoglobin: 15.2 g/dL — ABNORMAL HIGH (ref 12.0–15.0)
Immature Granulocytes: 0 %
Lymphocytes Relative: 5 %
Lymphs Abs: 0.7 10*3/uL (ref 0.7–4.0)
MCH: 28.4 pg (ref 26.0–34.0)
MCHC: 31.1 g/dL (ref 30.0–36.0)
MCV: 91.2 fL (ref 80.0–100.0)
Monocytes Absolute: 0.4 10*3/uL (ref 0.1–1.0)
Monocytes Relative: 3 %
Neutro Abs: 12.3 10*3/uL — ABNORMAL HIGH (ref 1.7–7.7)
Neutrophils Relative %: 91 %
Platelets: 154 10*3/uL (ref 150–400)
RBC: 5.35 MIL/uL — ABNORMAL HIGH (ref 3.87–5.11)
RDW: 14.2 % (ref 11.5–15.5)
WBC: 13.5 10*3/uL — ABNORMAL HIGH (ref 4.0–10.5)
nRBC: 0 % (ref 0.0–0.2)

## 2023-11-27 LAB — URINALYSIS, W/ REFLEX TO CULTURE (INFECTION SUSPECTED)
Bacteria, UA: NONE SEEN
Bilirubin Urine: NEGATIVE
Glucose, UA: NEGATIVE mg/dL
Hgb urine dipstick: NEGATIVE
Ketones, ur: NEGATIVE mg/dL
Leukocytes,Ua: NEGATIVE
Nitrite: NEGATIVE
Protein, ur: 100 mg/dL — AB
Specific Gravity, Urine: 1.019 (ref 1.005–1.030)
pH: 7 (ref 5.0–8.0)

## 2023-11-27 LAB — COMPREHENSIVE METABOLIC PANEL WITH GFR
ALT: 35 U/L (ref 0–44)
AST: 32 U/L (ref 15–41)
Albumin: 3.8 g/dL (ref 3.5–5.0)
Alkaline Phosphatase: 83 U/L (ref 38–126)
Anion gap: 12 (ref 5–15)
BUN: 14 mg/dL (ref 6–20)
CO2: 20 mmol/L — ABNORMAL LOW (ref 22–32)
Calcium: 9.2 mg/dL (ref 8.9–10.3)
Chloride: 100 mmol/L (ref 98–111)
Creatinine, Ser: 0.94 mg/dL (ref 0.44–1.00)
GFR, Estimated: >60 mL/min (ref >60)
Glucose, Bld: 151 mg/dL — ABNORMAL HIGH (ref 70–99)
Potassium: 4.2 mmol/L (ref 3.5–5.1)
Sodium: 132 mmol/L — ABNORMAL LOW (ref 135–145)
Total Bilirubin: 1.1 mg/dL (ref 0.0–1.2)
Total Protein: 8.4 g/dL — ABNORMAL HIGH (ref 6.5–8.1)

## 2023-11-27 LAB — HEMOGLOBIN A1C
Hgb A1c MFr Bld: 6.4 % — ABNORMAL HIGH (ref 4.8–5.6)
Mean Plasma Glucose: 136.98 mg/dL

## 2023-11-27 LAB — PROTIME-INR
INR: 1.1 (ref 0.8–1.2)
Prothrombin Time: 14.2 s (ref 11.4–15.2)

## 2023-11-27 LAB — GLUCOSE, CAPILLARY
Glucose-Capillary: 175 mg/dL — ABNORMAL HIGH (ref 70–99)
Glucose-Capillary: 185 mg/dL — ABNORMAL HIGH (ref 70–99)

## 2023-11-27 MED ORDER — INSULIN ASPART 100 UNIT/ML IJ SOLN
0.0000 [IU] | Freq: Three times a day (TID) | INTRAMUSCULAR | Status: DC
  Administered 2023-11-27 – 2023-11-28 (×2): 4 [IU] via SUBCUTANEOUS
  Administered 2023-11-28: 3 [IU] via SUBCUTANEOUS
  Administered 2023-11-28: 4 [IU] via SUBCUTANEOUS
  Administered 2023-11-29: 3 [IU] via SUBCUTANEOUS

## 2023-11-27 MED ORDER — CEFAZOLIN SODIUM-DEXTROSE 2-4 GM/100ML-% IV SOLN
2.0000 g | Freq: Three times a day (TID) | INTRAVENOUS | Status: DC
  Administered 2023-11-27 (×2): 2 g via INTRAVENOUS
  Filled 2023-11-27 (×2): qty 100

## 2023-11-27 MED ORDER — SODIUM CHLORIDE 0.9 % IV SOLN: 2.0000 g | Freq: Once | INTRAVENOUS | Status: DC

## 2023-11-27 MED ORDER — POLYETHYLENE GLYCOL 3350 17 G PO PACK: 17.0000 g | PACK | Freq: Every day | ORAL | Status: DC | PRN

## 2023-11-27 MED ORDER — GABAPENTIN 300 MG PO CAPS
600.0000 mg | ORAL_CAPSULE | Freq: Every day | ORAL | Status: DC
  Administered 2023-11-27 – 2023-11-28 (×2): 600 mg via ORAL
  Filled 2023-11-27 (×2): qty 2

## 2023-11-27 MED ORDER — SODIUM CHLORIDE 0.9 % IV BOLUS
1000.0000 mL | Freq: Once | INTRAVENOUS | Status: AC
  Administered 2023-11-27: 1000 mL via INTRAVENOUS

## 2023-11-27 MED ORDER — BISACODYL 10 MG RE SUPP: 10.0000 mg | Freq: Every day | RECTAL | Status: DC | PRN

## 2023-11-27 MED ORDER — CEFTRIAXONE SODIUM 1 G IJ SOLR
1.0000 g | Freq: Once | INTRAMUSCULAR | Status: AC
  Administered 2023-11-27: 1 g via INTRAMUSCULAR
  Filled 2023-11-27: qty 10

## 2023-11-27 MED ORDER — IBUPROFEN 600 MG PO TABS: 600.0000 mg | ORAL_TABLET | Freq: Once | ORAL | Status: AC

## 2023-11-27 MED ORDER — INSULIN ASPART 100 UNIT/ML IJ SOLN: 0.0000 [IU] | Freq: Every day | INTRAMUSCULAR | Status: DC

## 2023-11-27 MED ORDER — SODIUM CHLORIDE 0.9% FLUSH
3.0000 mL | Freq: Two times a day (BID) | INTRAVENOUS | Status: DC
  Administered 2023-11-27 – 2023-11-28 (×2): 3 mL via INTRAVENOUS

## 2023-11-27 MED ORDER — SODIUM CHLORIDE 0.9 % IV SOLN
2.0000 g | Freq: Three times a day (TID) | INTRAVENOUS | Status: DC
  Administered 2023-11-28 (×2): 2 g via INTRAVENOUS
  Filled 2023-11-27 (×2): qty 12.5

## 2023-11-27 MED ORDER — OXYCODONE HCL 5 MG PO TABS
10.0000 mg | ORAL_TABLET | ORAL | Status: DC | PRN
  Administered 2023-11-27 – 2023-11-28 (×2): 10 mg via ORAL
  Filled 2023-11-27 (×2): qty 2

## 2023-11-27 MED ORDER — TRAZODONE HCL 50 MG PO TABS: 50.0000 mg | ORAL_TABLET | Freq: Every evening | ORAL | Status: DC | PRN

## 2023-11-27 MED ORDER — ACETAMINOPHEN 650 MG RE SUPP: 650.0000 mg | Freq: Four times a day (QID) | RECTAL | Status: DC | PRN

## 2023-11-27 MED ORDER — IBUPROFEN 600 MG PO TABS
ORAL_TABLET | ORAL | Status: AC
  Administered 2023-11-27: 600 mg via ORAL
  Filled 2023-11-27: qty 1

## 2023-11-27 MED ORDER — ACETAMINOPHEN 325 MG PO TABS
650.0000 mg | ORAL_TABLET | Freq: Four times a day (QID) | ORAL | Status: DC | PRN
  Administered 2023-11-27: 650 mg via ORAL
  Filled 2023-11-27: qty 2

## 2023-11-27 MED ORDER — SODIUM CHLORIDE 0.9% FLUSH: 3.0000 mL | INTRAVENOUS | Status: DC | PRN

## 2023-11-27 MED ORDER — INSULIN ASPART 100 UNIT/ML IJ SOLN
4.0000 [IU] | Freq: Three times a day (TID) | INTRAMUSCULAR | Status: DC
  Administered 2023-11-27 – 2023-11-29 (×6): 4 [IU] via SUBCUTANEOUS

## 2023-11-27 MED ORDER — INSULIN GLARGINE-YFGN 100 UNIT/ML ~~LOC~~ SOLN
35.0000 [IU] | Freq: Two times a day (BID) | SUBCUTANEOUS | Status: DC
  Filled 2023-11-27 (×3): qty 0.35

## 2023-11-27 MED ORDER — ONDANSETRON HCL 4 MG/2ML IJ SOLN
4.0000 mg | Freq: Four times a day (QID) | INTRAMUSCULAR | Status: DC | PRN
  Administered 2023-11-27 – 2023-11-29 (×5): 4 mg via INTRAVENOUS
  Filled 2023-11-27 (×5): qty 2

## 2023-11-27 MED ORDER — INSULIN GLARGINE-YFGN 100 UNIT/ML ~~LOC~~ SOLN
90.0000 [IU] | Freq: Every day | SUBCUTANEOUS | Status: DC
  Administered 2023-11-28: 90 [IU] via SUBCUTANEOUS
  Filled 2023-11-27 (×2): qty 0.9

## 2023-11-27 MED ORDER — SODIUM CHLORIDE 0.9 % IV SOLN: INTRAVENOUS | Status: AC | PRN

## 2023-11-27 MED ORDER — ONDANSETRON HCL 4 MG PO TABS: 4.0000 mg | ORAL_TABLET | Freq: Four times a day (QID) | ORAL | Status: DC | PRN

## 2023-11-27 MED ORDER — LIDOCAINE HCL (PF) 1 % IJ SOLN
INTRAMUSCULAR | Status: AC
Start: 2023-11-27 — End: 2023-11-27
  Administered 2023-11-27: 2.1 mL
  Filled 2023-11-27: qty 5

## 2023-11-27 MED ORDER — HEPARIN SODIUM (PORCINE) 5000 UNIT/ML IJ SOLN
5000.0000 [IU] | Freq: Three times a day (TID) | INTRAMUSCULAR | Status: DC
  Administered 2023-11-27 – 2023-11-29 (×6): 5000 [IU] via SUBCUTANEOUS
  Filled 2023-11-27 (×5): qty 1

## 2023-11-27 MED ORDER — LACTATED RINGERS IV BOLUS
1000.0000 mL | Freq: Once | INTRAVENOUS | Status: AC
  Administered 2023-11-27: 1000 mL via INTRAVENOUS

## 2023-11-27 MED ORDER — NYSTATIN 100000 UNIT/GM EX POWD
Freq: Two times a day (BID) | CUTANEOUS | Status: DC
  Filled 2023-11-27 (×2): qty 15

## 2023-11-27 MED ORDER — SODIUM CHLORIDE 0.9% FLUSH
3.0000 mL | Freq: Two times a day (BID) | INTRAVENOUS | Status: DC
  Administered 2023-11-27 – 2023-11-29 (×4): 3 mL via INTRAVENOUS

## 2023-11-27 MED ORDER — BUSPIRONE HCL 5 MG PO TABS
5.0000 mg | ORAL_TABLET | Freq: Two times a day (BID) | ORAL | Status: DC
  Administered 2023-11-27 – 2023-11-29 (×4): 5 mg via ORAL
  Filled 2023-11-27 (×4): qty 1

## 2023-11-27 NOTE — ED Triage Notes (Signed)
 Pt c/o right leg pain with redness, pain, fevers, chills, and nausea; hx of cellulitis

## 2023-11-27 NOTE — ED Notes (Signed)
 Pt preferred to hold off on the Ordered Semglee  Insulin  for right now, stating she takes her 90 units in the evenings at home.

## 2023-11-27 NOTE — Progress Notes (Signed)
   11/27/23 1852  TOC Brief Assessment  Insurance and Status Reviewed  Patient has primary care physician Yes  Home environment has been reviewed From home c/supportive husband and daughter  Prior level of function: Assisted  Prior/Current Home Services No current home services  Social Drivers of Health Review SDOH reviewed interventions complete  Readmission risk has been reviewed Yes  Transition of care needs no transition of care needs at this time   Pt from home, husband and daughter assist w/ADLs as needed and provide transportation. Pt has DM2 c/BMI 85, A1c 6.4 down from 8.6 in August 2024. Pt started Ozempic three weeks ago. Pt states she has had mild GI symptoms and decreased appetite c/Ozempic. Instructed pt and husband on importance of eating nutritionally dense foods c/decreased appetite, small portions and incorporating small healthy snacks between meals to keep CBGs steady.   Pt admitted c/cellulitis to [facility] which has been recurrent over the past year. Pt has chronic lymphedema to BLE. Pt is active c/Sarah Brain Cahill, OT and certified lymphedema therapist at [facility] outpatient therapy. Per pt, therapist is working on Therapist, occupational for sequential compression pumps for her legs. Pt states she ambulates independently for short distances in the home, has an adjustable bed, no DME.

## 2023-11-27 NOTE — ED Provider Notes (Addendum)
 [place] EMERGENCY DEPARTMENT AT [facility]  Provider Note   CSN: [id number] Arrival date & time: 11/27/23  0944     History  No chief complaint on file.   Brittany [name] is a 43 y.o. female.  HPI   43 year old female history of lymphedema and cellulitis secondary to lymphedema presents today complaining of pain and swelling in her right leg.  She states that she was well last night and this morning.  When she got up this morning she did some massage of her leg.  An hour or so later it became hot and indurated and she had chills and a fever to 102.  She now has redness and swelling with induration of the right medial aspect of her thigh.  Home Medications Prior to Admission medications   Medication Sig Start Date End Date Taking? Authorizing Provider  acetaminophen  (TYLENOL ) 500 MG tablet Take 1,000 mg by mouth every 8 (eight) hours as needed for moderate pain, headache or fever.    [provider]  busPIRone  (BUSPAR ) 5 MG tablet Take 5 mg by mouth 2 (two) times daily. 06/06/23   [provider]  colchicine 0.6 MG tablet Take 0.6 mg by mouth 2 (two) times daily as needed (gout). 02/22/23   [provider]  gabapentin  (NEURONTIN ) 600 MG tablet Take 1 tablet (600 mg total) by mouth 3 (three) times daily. Patient taking differently: Take 600 mg by mouth at bedtime. 02/16/23   Colin Dawley, MD  ibuprofen  (ADVIL ) 200 MG tablet Take 400 mg by mouth every 6 (six) hours as needed for mild pain (pain score 1-3).    [provider]  insulin  glargine-yfgn (SEMGLEE , YFGN,) 100 UNIT/ML Pen Inject 23-90 Units into the skin See admin instructions. Inject 23 units before breakfast and 90 units at bedtime.    [provider]  NOVOLOG  FLEXPEN 100 UNIT/ML FlexPen Inject 5-28 Units into the skin 3 (three) times daily with meals as needed for high blood sugar. 06/06/23   [provider]  nystatin  (MYCOSTATIN /NYSTOP ) powder Apply topically  2 (two) times daily. 06/14/23   Shahmehdi, Constantino Demark, MD  ondansetron  (ZOFRAN -ODT) 8 MG disintegrating tablet Take 8 mg by mouth daily.    [provider]  SSD 1 % cream Apply 1 Application topically 2 (two) times daily. 06/06/23   [provider]  VICTOZA 18 MG/3ML SOPN Inject 1.8 mg into the skin daily. 07/11/19   [provider]      Allergies    Paxil [paroxetine hcl], Morphine  and codeine, Latex, Lipitor [atorvastatin], and Sulfa antibiotics    Review of Systems   Review of Systems  Physical Exam Updated Vital Signs BP (!) 141/77   Pulse (!) 120   Temp 97.9 F (36.6 C)   Resp (!) 24   Ht 1.803 m (5\' 11" )   Wt (!) 276.7 kg   SpO2 99%   BMI 85.08 kg/m  Physical Exam Vitals reviewed.  Constitutional:      Appearance: She is obese.  HENT:     Head: Normocephalic.     Right Ear: External ear normal.     Left Ear: External ear normal.     Nose: Nose normal.     Mouth/Throat:     Pharynx: Oropharynx is clear.  Eyes:     Pupils: Pupils are equal, round, and reactive to light.  Cardiovascular:     Rate and Rhythm: Tachycardia present.     Pulses: Normal pulses.  Musculoskeletal:  Cervical back: Normal range of motion.     Comments: Right lower extremity with large area of medial thigh that is indurated and hot to touch with tenderness.  No obvious fluctuance noted  Skin:    General: Skin is warm.     Capillary Refill: Capillary refill takes less than 2 seconds.  Neurological:     Mental Status: She is alert.  Psychiatric:        Mood and Affect: Mood normal.     ED Results / Procedures / Treatments   Labs (all labs ordered are listed, but only abnormal results are displayed) Labs Reviewed  COMPREHENSIVE METABOLIC PANEL WITH GFR - Abnormal; Notable for the following components:      Result Value   Sodium 132 (*)    CO2 20 (*)    Glucose, Bld 151 (*)    Total Protein 8.4 (*)    All other components within normal limits  LACTIC ACID,  PLASMA - Abnormal; Notable for the following components:   Lactic Acid, Venous 2.5 (*)    All other components within normal limits  LACTIC ACID, PLASMA - Abnormal; Notable for the following components:   Lactic Acid, Venous 2.0 (*)    All other components within normal limits  CBC WITH DIFFERENTIAL/PLATELET - Abnormal; Notable for the following components:   WBC 13.5 (*)    RBC 5.35 (*)    Hemoglobin 15.2 (*)    HCT 48.8 (*)    Neutro Abs 12.3 (*)    All other components within normal limits  CULTURE, BLOOD (ROUTINE X 2)  CULTURE, BLOOD (ROUTINE X 2)  PROTIME-INR  URINALYSIS, W/ REFLEX TO CULTURE (INFECTION SUSPECTED)  HEMOGLOBIN A1C  POC URINE PREG, ED    EKG None  Radiology DG Chest Port 1 View Result Date: 11/27/2023 CLINICAL DATA:  Fever, chills, nausea, right leg pain and redness. EXAM: PORTABLE CHEST 1 VIEW COMPARISON:  06/09/2023 FINDINGS: Normal sized heart. Clear lungs with normal vascularity. Unremarkable bones. IMPRESSION: Negative. Electronically Signed   By: Catherin Closs M.D.   On: 11/27/2023 12:26    Procedures .Critical Care  Performed by: Auston Blush, MD Authorized by: Auston Blush, MD   Critical care provider statement:    Critical care time (minutes):  30   Critical care end time:  11/27/2023 1:50 PM   Critical care was necessary to treat or prevent imminent or life-threatening deterioration of the following conditions:  Sepsis   Critical care was time spent personally by me on the following activities:  Development of treatment plan with patient or surrogate, discussions with consultants, evaluation of patient's response to treatment, examination of patient, ordering and review of laboratory studies, ordering and review of radiographic studies, ordering and performing treatments and interventions, pulse oximetry, re-evaluation of patient's condition and review of old charts     Medications Ordered in ED Medications  ceFAZolin  (ANCEF ) IVPB 2g/100 mL  premix (has no administration in time range)  insulin  glargine-yfgn (SEMGLEE ) injection 35 Units (has no administration in time range)  insulin  aspart (novoLOG ) injection 0-20 Units (has no administration in time range)  insulin  aspart (novoLOG ) injection 0-5 Units (has no administration in time range)  insulin  aspart (novoLOG ) injection 4 Units (has no administration in time range)  busPIRone  (BUSPAR ) tablet 5 mg (has no administration in time range)  gabapentin  (NEURONTIN ) tablet 600 mg (has no administration in time range)  nystatin  (MYCOSTATIN /NYSTOP ) topical powder (has no administration in time range)  sodium chloride  flush (NS) 0.9 %  injection 3 mL (has no administration in time range)  sodium chloride  flush (NS) 0.9 % injection 3 mL (has no administration in time range)  sodium chloride  flush (NS) 0.9 % injection 3 mL (has no administration in time range)  0.9 %  sodium chloride  infusion (has no administration in time range)  acetaminophen  (TYLENOL ) tablet 650 mg (has no administration in time range)    Or  acetaminophen  (TYLENOL ) suppository 650 mg (has no administration in time range)  traZODone  (DESYREL ) tablet 50 mg (has no administration in time range)  polyethylene glycol (MIRALAX  / GLYCOLAX ) packet 17 g (has no administration in time range)  bisacodyl  (DULCOLAX) suppository 10 mg (has no administration in time range)  ondansetron  (ZOFRAN ) tablet 4 mg (has no administration in time range)    Or  ondansetron  (ZOFRAN ) injection 4 mg (has no administration in time range)  heparin  injection 5,000 Units (has no administration in time range)  cefTRIAXone  (ROCEPHIN ) injection 1 g (1 g Intramuscular Given 11/27/23 1155)  lidocaine  (PF) (XYLOCAINE ) 1 % injection (2.1 mLs  Given 11/27/23 1154)  lactated ringers  bolus 1,000 mL (0 mLs Intravenous Stopped 11/27/23 1318)    ED Course/ Medical Decision Making/ A&P Clinical Course as of 11/27/23 1352  Mon Nov 27, 2023  1329 CBC reviewed  interpreted significant for leukocytosis white count 13,500 Complete metabolic panel significant for sodium 132, glucose 151 otherwise within normal limits First lactic acid is elevated 2.0 Patient has received Rocephin  receiving IV fluids. [DR]    Clinical Course User Index [DR] Auston Blush, MD                                 Medical Decision Making Amount and/or Complexity of Data Reviewed Labs: ordered.  Risk Prescription drug management. Decision regarding hospitalization.   43 year old female morbid obesity, diabetes, lymph edema of her legs presents today with redness, swelling, pain consistent with cellulitis Differential diagnosis includes was not limited to simple lymphedema, cellulitis, necrotizing fasciitis, abscess, other dermatitis, contact dermatitis, insect bites. Based on patient's history, physical exam, labs, this is most consistent with cellulitis.  No fluctuance to suggest abscess.  Patient with multiple episodes of prior cellulitis due to her lymphedema. Patient does have a slightly elevated lactic acid at 2 with tachycardia.  She also has a leukocytosis.  Patient is treated with Rocephin .  IV fluids. Plan to demarcate area with marker, continue antibiotics, fluids, will need repeat lactic acid. Will consult hospitalist for admission Discussed with Dr. Quintella Buck who will see for admission        Final Clinical Impression(s) / ED Diagnoses Final diagnoses:  Cellulitis of right lower extremity  Sepsis without acute organ dysfunction, due to unspecified organism [facility])    Rx / DC Orders ED Discharge Orders     None         Auston Blush, MD 11/27/23 1351    Auston Blush, MD 11/27/23 1352

## 2023-11-27 NOTE — Progress Notes (Signed)
 Patient alert and oriented x4. Vital signs checked; red MEWS. Provider notified. Orders to follow.  11/27/23 2229  Assess: MEWS Score  Temp (!) 101.3 F (38.5 C)  BP (!) 95/48  MAP (mmHg) (!) 61  Pulse Rate (!) 112  Resp (!) 24  SpO2 92 %  O2 Device Room Air  Assess: MEWS Score  MEWS Temp 1  MEWS Systolic 1  MEWS Pulse 2  MEWS RR 1  MEWS LOC 0  MEWS Score 5  MEWS Score Color Red  Assess: if the MEWS score is Yellow or Red  Were vital signs accurate and taken at a resting state? Yes  MEWS guidelines implemented  Yes, red  Treat  MEWS Interventions Considered administering scheduled or prn medications/treatments as ordered  Take Vital Signs  Increase Vital Sign Frequency  Red: Q1hr x2, continue Q4hrs until patient remains green for 12hrs  Escalate  MEWS: Escalate Red: Discuss with charge nurse and notify provider. Consider notifying RRT. If remains red for 2 hours consider need for higher level of care  Provider Notification  Provider Name/Title Burnie Cartwright, DO  Date Provider Notified 11/27/23  Time Provider Notified 2235  Method of Notification Page  Notification Reason Change in status  Provider response See new orders  Date of Provider Response 11/27/23  Time of Provider Response 2236  Assess: SIRS CRITERIA  SIRS Temperature  1  SIRS Respirations  1  SIRS Pulse 1  SIRS WBC 0  SIRS Score Sum  3

## 2023-11-27 NOTE — Progress Notes (Signed)
 Mobility Specialist Progress Note:    11/27/23 1420  Mobility  Activity Ambulated with assistance in room  Level of Assistance Minimal assist, patient does 75% or more  Assistive Device None  Distance Ambulated (ft) 10 ft  Range of Motion/Exercises Active;All extremities  Activity Response Tolerated well  Mobility visit 1 Mobility   Pt ambulated with assistance after being brought up from ED. Husband helps pt, required Min/ModA to stand and ambulate with no AD. Tolerated well, asx throughout. Left pt with RN and husband. All needs met.  Glinda Lapping Mobility Specialist Please contact via Special educational needs teacher or  Rehab office at [phone]

## 2023-11-27 NOTE — H&P (Addendum)
 History and Physical    Patient: Brittany [name] OZH:[id number] DOB: [birth year] DOA: 11/27/2023 DOS: the patient was seen and examined on 11/27/2023 PCP: Fredick Jarred, PA-C  Patient coming from: Home  Chief Complaint: CC---Rt Leg swelling/Fevers  HPI: Brittany [name] is a 43 y.o. female with medical history significant for  anxiety, chronic bilateral lower extremity lymphedema with history of recurrent cellulitis, DM2 with gastroparesis, GERD, morbid obesity , OSA noncompliant with CPAP and hypothyroidism presented to the ED with sudden onset of fevers above 102 at home, chills, nausea and emesis x 3 without bile or blood in the right medial thigh area redness warmth tenderness and swelling consistent with prior episodes of cellulitis superimposed on head chronic lymphedema. Symptoms started around 8 AM on 11/27/2023 - Additional history obtained from patient's husband at bedside - -No sick contacts at home, denies trauma or injury - No chest pain palpitations no dizziness no shortness of breath no urinary symptoms  -Heart rate in the 120s, tachypnea up to 25  In ED - Venous Dopplers without DVT in the either lower extremity - Chest x-ray without acute findings - UA negative Lactic Acid 2.5 >> 2.0 WBC 13.5 Hgb 15.2 , platelets 154 - Blood cultures pending - Sodium 132, potassium 4.2, creatinine 0.9 glucose 151 bicarb 20 anion gap 12 LFTs are not elevated  Review of Systems: As mentioned in the history of present illness. All other systems reviewed and are negative. Past Medical History:  Diagnosis Date   Anxiety    Carpal tunnel syndrome on left 10/12/2016   Cellulitis and abscess of leg    Chronic abdominal pain    Depression    Diabetes mellitus without complication (HCC)    Gallstones    Gastroparesis    GERD (gastroesophageal reflux disease) 08/13/2017   Headache    Hypothyroid 01/03/2018   Lymphedema    Sleep apnea    DX with sleep apnea - unable to use c-pap   Umbilical  hernia    Past Surgical History:  Procedure Laterality Date   BIOPSY  11/11/2019   Procedure: BIOPSY;  Surgeon: Lindle Rhea, MD;  Location: [facility];  Service: Gastroenterology;;   CESAREAN SECTION  11/09/01   ESOPHAGOGASTRODUODENOSCOPY (EGD) WITH PROPOFOL  N/A 11/11/2019   Procedure: ESOPHAGOGASTRODUODENOSCOPY (EGD) WITH PROPOFOL ;  Surgeon: Lindle Rhea, MD;  Location: [facility];  Service: Gastroenterology;  Laterality: N/A;   HERNIA REPAIR     INSERTION OF MESH N/A 01/02/2013   Procedure: INSERTION OF MESH;  Surgeon: Evander Hills, DO;  Location: [facility];  Service: General;  Laterality: N/A;   UMBILICAL HERNIA REPAIR N/A 01/02/2013   Procedure: LAPAROSCOPIC UMBILICAL HERNIA;  Surgeon: Evander Hills, DO;  Location: [facility];  Service: General;  Laterality: N/A;   Social History:  reports that she has never smoked. She has never used smokeless tobacco. She reports that she does not drink alcohol and does not use drugs.  Allergies  Allergen Reactions   Paxil [Paroxetine Hcl] Other (See Comments)    Suicidal thoughts   Morphine  And Codeine Other (See Comments)    "it just freaks me out" Just morphine    Latex Itching, Swelling and Rash   Lipitor [Atorvastatin] Rash   Sulfa Antibiotics Nausea And Vomiting and Rash    Family History  Problem Relation Age of Onset   Cancer Mother        leukemia   Diabetes Mother    Hypertension Mother    Hyperlipidemia Mother    Other Father  Sciatica, kidney stones   Alcohol abuse Paternal Grandfather    Other Paternal Grandmother        bowel obstruction; pneumonia   Hypertension Maternal Grandmother    Diabetes Maternal Grandmother    Cancer Maternal Grandfather        lung   ADD / ADHD Daughter    Depression Daughter    Anxiety disorder Daughter     Prior to Admission medications   Medication Sig Start Date End Date Taking? Authorizing Provider  acetaminophen  (TYLENOL ) 500 MG tablet Take 1,000 mg by mouth every 8 (eight)  hours as needed for moderate pain, headache or fever.    [provider]  busPIRone  (BUSPAR ) 5 MG tablet Take 5 mg by mouth 2 (two) times daily. 06/06/23   [provider]  colchicine 0.6 MG tablet Take 0.6 mg by mouth 2 (two) times daily as needed (gout). 02/22/23   [provider]  gabapentin  (NEURONTIN ) 600 MG tablet Take 1 tablet (600 mg total) by mouth 3 (three) times daily. Patient taking differently: Take 600 mg by mouth at bedtime. 02/16/23   Colin Dawley, MD  ibuprofen  (ADVIL ) 200 MG tablet Take 400 mg by mouth every 6 (six) hours as needed for mild pain (pain score 1-3).    [provider]  insulin  glargine-yfgn (SEMGLEE , YFGN,) 100 UNIT/ML Pen Inject 23-90 Units into the skin See admin instructions. Inject 23 units before breakfast and 90 units at bedtime.    [provider]  NOVOLOG  FLEXPEN 100 UNIT/ML FlexPen Inject 5-28 Units into the skin 3 (three) times daily with meals as needed for high blood sugar. 06/06/23   [provider]  nystatin  (MYCOSTATIN /NYSTOP ) powder Apply topically 2 (two) times daily. 06/14/23   Shahmehdi, Constantino Demark, MD  ondansetron  (ZOFRAN -ODT) 8 MG disintegrating tablet Take 8 mg by mouth daily.    [provider]  SSD 1 % cream Apply 1 Application topically 2 (two) times daily. 06/06/23   [provider]  VICTOZA 18 MG/3ML SOPN Inject 1.8 mg into the skin daily. 07/11/19   [provider]    Physical Exam: Vitals:   11/27/23 0957 11/27/23 0958 11/27/23 1400 11/27/23 1515  BP:  (!) 141/77 (!) 146/68 (!) 140/68  Pulse:  (!) 120 (!) 112 (!) 107  Resp:  (!) 24 (!) 25 20  Temp:  97.9 F (36.6 C) 98 F (36.7 C) 99.5 F (37.5 C)  TempSrc:   Oral Oral  SpO2:  99% 95% 95%  Weight: (!) 276.7 kg     Height: 5\' 11"  (1.803 m)       Physical Exam  Gen:- Awake Alert, in no acute distress morbidly obese HEENT:- [place].AT, No sclera icterus Neck-Supple Neck,No JVD,.  Lungs-  CTAB , fair air  movement bilaterally  CV- S1, S2 normal, RRR Abd-  +ve B.Sounds, Abd Soft, No tenderness, increased truncal adiposity Extremity--  good pedal pulses  Psych-affect is appropriate, oriented x3 Neuro--generalized weakness, no new focal deficits, no tremors MSK/Skin--- right medial thigh area erythema warmth tenderness and swelling noted -  Media Information  Document Information  Photos  Rt thigh Cellulitis and Lymphedema  11/27/2023 16:30  Attached To:  Hospital Encounter on 11/27/23  Source Information  Colin Dawley, MD  Ap-Dept 300  Document History    Data Reviewed:  Venous Dopplers without DVT in the either lower extremity - Chest x-ray without acute findings - UA negative Lactic Acid 2.5 >> 2.0 WBC 13.5 Hgb 15.2 , platelets 154 -  Blood cultures pending - Sodium 132, potassium 4.2, creatinine 0.9 glucose 151 bicarb 20 anion gap 12 LFTs are not elevated  Assessment and Plan: 1)Sepsis secondary to rt Thigh Cellulitis---POA -in the setting of chronic rather extensive lymphedema --WBC 13.5, tachypnea with respiratory rate of 25, tachycardia with heart rate in the 120s, fevers above 102 at home,---patient met criteria for sepsis on admission -Lactic Acid 2.5 >> 2.0 -Venous Dopplers without DVT in the either lower extremity - Patient with prior history of strep bacteremia from lower extremity cellulitis back in December 2024- -IV Ancef  for now pending further culture data  -Antipyretics as needed -As needed pain medications  2)Morbid Obesity/OSA -Low calorie diet, portion control and increase physical activity discussed with patient -Body mass index is 85.08 kg/m. --Patient noncompliant with CPAP   3)DM2---A1c is 8.6 reflecting uncontrolled diabetes with hyperglycemia PTA --Hold PTA Victoza - Continue Semglee  90 units nightly Use Novolog /Humalog Sliding scale insulin  with Accu-Cheks/Fingersticks as ordered  -Lifestyle and dietary modifications advised    4)Chronic Lymphedema--- bilateral lower extremity extensive chronic lymphedema --- Patient is Now agreeable to continue to see physical therapist who specializes in lymphedema as outpatient  -agreeable to have referral to lymphedema clinic with Leodis Rainwater (physical therapist with lymphedema certification at [facility]) -Patient has some lymphedema pumps at home but they are more than 45 years old and she has not used them in over a decade   5)Generalized weakness and deconditioning/ambulatory dysfunction-- --Complains of Rt lower extremity/ankle discomfort making it difficult for her to ambulate - Patient previously patient declined physical therapy eval, declined home health PT  6) anxiety disorder--- continue buspirone   7)Mild HypoNatremia--- sodium is 132  --avoid dehydration   Advance Care Planning:   Code Status: Full Code   Family Communication: Discussed with husband at bedside  Severity of Illness: The appropriate patient status for this patient is OBSERVATION. Observation status is judged to be reasonable and necessary in order to provide the required intensity of service to ensure the patient's safety. The patient's presenting symptoms, physical exam findings, and initial radiographic and laboratory data in the context of their medical condition is felt to place them at decreased risk for further clinical deterioration. Furthermore, it is anticipated that the patient will be medically stable for discharge from the hospital within 2 midnights of admission.   Author: Colin Dawley, MD 11/27/2023 6:24 PM  For on call review [email or web address].

## 2023-11-27 NOTE — Progress Notes (Addendum)
 Contacted by RN regarding red MEWS  Pt with signs of shock.   - 1 L NS bolus  - ibuprofen  600 mg for fever  - Continue Tylenol   - Repeat Lactic acid  - Broaden to Cefepime and Vancomycin 

## 2023-11-28 DIAGNOSIS — K219 Gastro-esophageal reflux disease without esophagitis: Secondary | ICD-10-CM | POA: Diagnosis present

## 2023-11-28 DIAGNOSIS — E1165 Type 2 diabetes mellitus with hyperglycemia: Secondary | ICD-10-CM | POA: Diagnosis present

## 2023-11-28 DIAGNOSIS — E876 Hypokalemia: Secondary | ICD-10-CM

## 2023-11-28 DIAGNOSIS — L03115 Cellulitis of right lower limb: Secondary | ICD-10-CM | POA: Diagnosis present

## 2023-11-28 DIAGNOSIS — G5602 Carpal tunnel syndrome, left upper limb: Secondary | ICD-10-CM | POA: Diagnosis present

## 2023-11-28 DIAGNOSIS — I890 Lymphedema, not elsewhere classified: Secondary | ICD-10-CM

## 2023-11-28 DIAGNOSIS — E1143 Type 2 diabetes mellitus with diabetic autonomic (poly)neuropathy: Secondary | ICD-10-CM | POA: Diagnosis present

## 2023-11-28 DIAGNOSIS — L89890 Pressure ulcer of other site, unstageable: Secondary | ICD-10-CM | POA: Diagnosis present

## 2023-11-28 DIAGNOSIS — L0390 Cellulitis, unspecified: Secondary | ICD-10-CM | POA: Diagnosis not present

## 2023-11-28 DIAGNOSIS — E871 Hypo-osmolality and hyponatremia: Secondary | ICD-10-CM | POA: Diagnosis present

## 2023-11-28 DIAGNOSIS — K3184 Gastroparesis: Secondary | ICD-10-CM | POA: Diagnosis present

## 2023-11-28 DIAGNOSIS — A419 Sepsis, unspecified organism: Secondary | ICD-10-CM | POA: Diagnosis present

## 2023-11-28 DIAGNOSIS — G4733 Obstructive sleep apnea (adult) (pediatric): Secondary | ICD-10-CM | POA: Diagnosis present

## 2023-11-28 DIAGNOSIS — Z8619 Personal history of other infectious and parasitic diseases: Secondary | ICD-10-CM | POA: Diagnosis not present

## 2023-11-28 DIAGNOSIS — Z833 Family history of diabetes mellitus: Secondary | ICD-10-CM | POA: Diagnosis not present

## 2023-11-28 DIAGNOSIS — Z6841 Body Mass Index (BMI) 40.0 and over, adult: Secondary | ICD-10-CM | POA: Diagnosis not present

## 2023-11-28 DIAGNOSIS — F32A Depression, unspecified: Secondary | ICD-10-CM | POA: Diagnosis present

## 2023-11-28 DIAGNOSIS — E039 Hypothyroidism, unspecified: Secondary | ICD-10-CM | POA: Diagnosis present

## 2023-11-28 DIAGNOSIS — R262 Difficulty in walking, not elsewhere classified: Secondary | ICD-10-CM | POA: Diagnosis present

## 2023-11-28 DIAGNOSIS — G8929 Other chronic pain: Secondary | ICD-10-CM | POA: Diagnosis present

## 2023-11-28 DIAGNOSIS — F419 Anxiety disorder, unspecified: Secondary | ICD-10-CM | POA: Diagnosis present

## 2023-11-28 DIAGNOSIS — Z794 Long term (current) use of insulin: Secondary | ICD-10-CM | POA: Diagnosis not present

## 2023-11-28 DIAGNOSIS — Z91199 Patient's noncompliance with other medical treatment and regimen due to unspecified reason: Secondary | ICD-10-CM | POA: Diagnosis not present

## 2023-11-28 DIAGNOSIS — Z8249 Family history of ischemic heart disease and other diseases of the circulatory system: Secondary | ICD-10-CM | POA: Diagnosis not present

## 2023-11-28 DIAGNOSIS — M7989 Other specified soft tissue disorders: Secondary | ICD-10-CM | POA: Diagnosis present

## 2023-11-28 LAB — BASIC METABOLIC PANEL WITH GFR
Anion gap: 5 (ref 5–15)
BUN: 17 mg/dL (ref 6–20)
CO2: 22 mmol/L (ref 22–32)
Calcium: 8 mg/dL — ABNORMAL LOW (ref 8.9–10.3)
Chloride: 106 mmol/L (ref 98–111)
Creatinine, Ser: 1 mg/dL (ref 0.44–1.00)
GFR, Estimated: >60 mL/min (ref >60)
Glucose, Bld: 156 mg/dL — ABNORMAL HIGH (ref 70–99)
Potassium: 3.3 mmol/L — ABNORMAL LOW (ref 3.5–5.1)
Sodium: 133 mmol/L — ABNORMAL LOW (ref 135–145)

## 2023-11-28 LAB — CBC
HCT: 39.7 % (ref 36.0–46.0)
Hemoglobin: 12.6 g/dL (ref 12.0–15.0)
MCH: 28.5 pg (ref 26.0–34.0)
MCHC: 31.7 g/dL (ref 30.0–36.0)
MCV: 89.8 fL (ref 80.0–100.0)
Platelets: 149 10*3/uL — ABNORMAL LOW (ref 150–400)
RBC: 4.42 MIL/uL (ref 3.87–5.11)
RDW: 14.6 % (ref 11.5–15.5)
WBC: 10.2 10*3/uL (ref 4.0–10.5)
nRBC: 0 % (ref 0.0–0.2)

## 2023-11-28 LAB — GLUCOSE, CAPILLARY
Glucose-Capillary: 125 mg/dL — ABNORMAL HIGH (ref 70–99)
Glucose-Capillary: 163 mg/dL — ABNORMAL HIGH (ref 70–99)
Glucose-Capillary: 168 mg/dL — ABNORMAL HIGH (ref 70–99)
Glucose-Capillary: 134 mg/dL — ABNORMAL HIGH (ref 70–99)

## 2023-11-28 MED ORDER — LINEZOLID 600 MG/300ML IV SOLN
600.0000 mg | Freq: Two times a day (BID) | INTRAVENOUS | Status: DC
  Administered 2023-11-28 (×2): 600 mg via INTRAVENOUS
  Filled 2023-11-28 (×5): qty 300

## 2023-11-28 MED ORDER — CEFAZOLIN SODIUM-DEXTROSE 3-4 GM/150ML-% IV SOLN
3.0000 g | Freq: Three times a day (TID) | INTRAVENOUS | Status: DC
  Administered 2023-11-28 – 2023-11-29 (×4): 3 g via INTRAVENOUS
  Filled 2023-11-28 (×7): qty 150

## 2023-11-28 NOTE — Progress Notes (Signed)
 Therapist spoke to pt who stated that she just went to the bathroom and back with her daughter with no difficulty.   PT only ambulates short distances ie to the bathroom and back in her home.  Pt states that she has a wheel chair and an electric wheelchair for any longer distances.  PT states that she has been treated multiple times for her lymphedema in OP clinics and is not interested in seeking more treatment at this time.  Pt is not in need of skilled therapy at this time. Leodis Rainwater, PT CLT [phone]

## 2023-11-28 NOTE — Plan of Care (Signed)
 Patient Tmax 102.1 this shift. Antibiotics given as per orders. Fever decreased, Patient states she is "feeling a little better".  Problem: Safety: Goal: Ability to remain free from injury will improve Outcome: Progressing   Problem: Fluid Volume: Goal: Ability to maintain a balanced intake and output will improve Outcome: Progressing   Problem: Clinical Measurements: Goal: Ability to maintain clinical measurements within normal limits will improve Outcome: Progressing Goal: Will remain free from infection Outcome: Progressing Goal: Diagnostic test results will improve Outcome: Progressing Goal: Respiratory complications will improve Outcome: Progressing Goal: Cardiovascular complication will be avoided Outcome: Progressing

## 2023-11-28 NOTE — Progress Notes (Signed)
 Progress Note   Patient: Brittany [name]:[id number] DOB: [birth year] DOA: 11/27/2023     0 DOS: the patient was seen and examined on 11/28/2023   Brief hospital course: Brittany [name] is a 43 y.o. female with medical history significant for  anxiety, chronic bilateral lower extremity lymphedema with history of recurrent cellulitis, DM2 with gastroparesis, GERD, morbid obesity , OSA noncompliant with CPAP and hypothyroidism presented to the ED with sudden fever, chills, nausea, vomiting, right medial thigh area redness warmth tenderness and swelling. She had prior episodes of cellulitis superimposed on chronic lymphedema.  She is admitted to [facility] service for sepsis secondary to right leg cellulitis.  Assessment and Plan: Sepsis secondary to Right thigh cellulitis Chronic lower extremity edema WBC improved. Tmax 102.2. BP lower side yesterday required fluid boluses. Lactic acid normalized. Continue Cefazolin  therapy, discussed with pharmacy. DVT studies negative. Continue to monitor vitals closely.  Type 2 diabetes mellitus: A1c 6.4. She is on Ozempic, insulin  regimen. She is eating poor, appetite low. Hypoglycemia protocol.  Hyponatremia- Sodium 133 today, got IV hydration. Encourage oral fluids.  Hypokalemia- Oral kcl supplements ordered. Monitor daily electrolytes.  Chronic lymphedema- Outpatient lymphedema clinic follow up. PT follow up.  Morbid obesity BMI 85.08, Takes ozempic. Diet, exercise and weight reduction advised.  Anxiety disorder- continue buspar .  Active Pressure Injury/Wound(s)     Pressure Ulcer  Duration          Pressure Injury 11/27/23 Foot Right Unstageable - Full thickness tissue loss in which the base of the injury is covered by slough (yellow, tan, gray, green or brown) and/or eschar (tan, brown or black) in the wound bed. round, black, scab <1 day           Out of bed to chair. Incentive spirometry. Nursing supportive care. Fall,  aspiration precautions. Diet:  Diet Orders (From admission, onward)     Start     Ordered   11/27/23 1350  Diet heart healthy/carb modified Room service appropriate? Yes; Fluid consistency: Thin  Diet effective now       Question Answer Comment  Diet-HS Snack? Nothing   Room service appropriate? Yes   Fluid consistency: Thin      11/27/23 1350           DVT prophylaxis: heparin  injection 5,000 Units Start: 11/27/23 2200 SCDs Start: 11/27/23 1349 Place TED hose Start: 11/27/23 1349  Level of care: Med-Surg   Code Status: Full Code  Subjective: Patient is seen and examined today morning. She is lying in bed. Last night her BP lower side required IV fluid boluses. States her appetite poor. Did not get out of bed.  Physical Exam: Vitals:   11/28/23 0054 11/28/23 0344 11/28/23 0830 11/28/23 1200  BP: (!) 92/49 (!) 92/53 (!) 114/55 135/67  Pulse: 96 88 80 82  Resp: 20 20 19 20   Temp: 99.1 F (37.3 C) 97.7 F (36.5 C) 97.7 F (36.5 C) 98.7 F (37.1 C)  TempSrc: Oral Oral Oral Oral  SpO2: 95% 97% 98% 99%  Weight:      Height:        General - Young  morbidly obese Caucasian female, no apparent distress HEENT - PERRLA, EOMI, atraumatic head, non tender sinuses. Lung - distant breath sounds, diffuse rales, rhonchi. Heart - S1, S2 heard, no murmurs, rubs, chronic lymphedema. Abdomen - Soft, non tender, obese, bowel sounds distant Neuro - Alert, awake and oriented x 3, non focal exam. Skin - Warm and dry. Right thigh  redness, swelling, warmth.  Data Reviewed:      Latest Ref Rng & Units 11/28/2023    5:23 AM 11/27/2023   10:29 AM 06/13/2023    4:27 AM  CBC  WBC 4.0 - 10.5 K/uL 10.2  13.5  5.2   Hemoglobin 12.0 - 15.0 g/dL 19.1  47.8  29.5   Hematocrit 36.0 - 46.0 % 39.7  48.8  36.6   Platelets 150 - 400 K/uL 149  154  186       Latest Ref Rng & Units 11/28/2023    5:23 AM 11/27/2023   10:29 AM 06/13/2023    4:27 AM  BMP  Glucose 70 - 99 mg/dL 621  308  657   BUN  6 - 20 mg/dL 17  14  11    Creatinine 0.44 - 1.00 mg/dL 8.46  9.62  9.52   Sodium 135 - 145 mmol/L 133  132  137   Potassium 3.5 - 5.1 mmol/L 3.3  4.2  3.7   Chloride 98 - 111 mmol/L 106  100  103   CO2 22 - 32 mmol/L 22  20  25    Calcium 8.9 - 10.3 mg/dL 8.0  9.2  8.4    US  Venous Img Lower Bilateral (DVT) Result Date: 11/27/2023 CLINICAL DATA:  Right lower extremity edema EXAM: BILATERAL LOWER EXTREMITY VENOUS DOPPLER ULTRASOUND TECHNIQUE: Gray-scale sonography with graded compression, as well as color Doppler and duplex ultrasound were performed to evaluate the lower extremity deep venous systems from the level of the common femoral vein and including the common femoral, femoral, profunda femoral, popliteal and calf veins including the posterior tibial, peroneal and gastrocnemius veins when visible. The superficial great saphenous vein was also interrogated. Spectral Doppler was utilized to evaluate flow at rest and with distal augmentation maneuvers in the common femoral, femoral and popliteal veins. COMPARISON:  None Available. FINDINGS: RIGHT LOWER EXTREMITY Common Femoral Vein: No evidence of thrombus. Normal compressibility, respiratory phasicity and response to augmentation. Saphenofemoral Junction: No evidence of thrombus. Normal compressibility and flow on color Doppler imaging. Profunda Femoral Vein: No evidence of thrombus. Normal compressibility and flow on color Doppler imaging. Femoral Vein: No evidence of thrombus. Normal compressibility, respiratory phasicity and response to augmentation. Popliteal Vein: No evidence of thrombus. Normal compressibility, respiratory phasicity and response to augmentation. Calf Veins: No evidence of thrombus. Normal compressibility and flow on color Doppler imaging. Superficial Great Saphenous Vein: No evidence of thrombus. Normal compressibility. Venous Reflux:  None. Other Findings:  None. LEFT LOWER EXTREMITY Common Femoral Vein: No evidence of thrombus.  Normal compressibility, respiratory phasicity and response to augmentation. Saphenofemoral Junction: No evidence of thrombus. Normal compressibility and flow on color Doppler imaging. Profunda Femoral Vein: No evidence of thrombus. Normal compressibility and flow on color Doppler imaging. Femoral Vein: No evidence of thrombus. Normal compressibility, respiratory phasicity and response to augmentation. Popliteal Vein: No evidence of thrombus. Normal compressibility, respiratory phasicity and response to augmentation. Calf Veins: No evidence of thrombus. Normal compressibility and flow on color Doppler imaging. Superficial Great Saphenous Vein: No evidence of thrombus. Normal compressibility. Venous Reflux:  None. Other Findings:  None. IMPRESSION: No evidence of deep venous thrombosis in either lower extremity. Electronically Signed   By: Fernando Hoyer M.D.   On: 11/27/2023 15:45   DG Chest Port 1 View Result Date: 11/27/2023 CLINICAL DATA:  Fever, chills, nausea, right leg pain and redness. EXAM: PORTABLE CHEST 1 VIEW COMPARISON:  06/09/2023 FINDINGS: Normal sized heart. Clear lungs with normal  vascularity. Unremarkable bones. IMPRESSION: Negative. Electronically Signed   By: Catherin Closs M.D.   On: 11/27/2023 12:26    Family Communication: Discussed with patient, she understand and agree. All questions answered.  Disposition: Status is: Inpatient Remains inpatient appropriate because: sepsis, IV antibiotics.  Planned Discharge Destination: Home     MDM level 3- She is septic with low BP, got IV fluid boluses, broad spectrum abx, need close monitoring. High risk for sudden clinical deterioration.  Author: Aisha Hove, MD 11/28/2023 2:15 PM Secure chat 7am to 7pm For on call review [email or web address].

## 2023-11-28 NOTE — Plan of Care (Signed)

## 2023-11-29 DIAGNOSIS — L0390 Cellulitis, unspecified: Secondary | ICD-10-CM | POA: Diagnosis not present

## 2023-11-29 DIAGNOSIS — Z794 Long term (current) use of insulin: Secondary | ICD-10-CM

## 2023-11-29 DIAGNOSIS — E871 Hypo-osmolality and hyponatremia: Secondary | ICD-10-CM | POA: Diagnosis not present

## 2023-11-29 DIAGNOSIS — L03115 Cellulitis of right lower limb: Secondary | ICD-10-CM | POA: Diagnosis not present

## 2023-11-29 DIAGNOSIS — E1165 Type 2 diabetes mellitus with hyperglycemia: Secondary | ICD-10-CM

## 2023-11-29 LAB — GLUCOSE, CAPILLARY
Glucose-Capillary: 117 mg/dL — ABNORMAL HIGH (ref 70–99)
Glucose-Capillary: 103 mg/dL — ABNORMAL HIGH (ref 70–99)
Glucose-Capillary: 148 mg/dL — ABNORMAL HIGH (ref 70–99)

## 2023-11-29 MED ORDER — IBUPROFEN 800 MG PO TABS: ORAL_TABLET | ORAL | 0 refills | Status: AC

## 2023-11-29 MED ORDER — DOXYCYCLINE HYCLATE 100 MG PO TABS: 100.0000 mg | ORAL_TABLET | Freq: Two times a day (BID) | ORAL | 0 refills | Status: AC

## 2023-11-29 MED ORDER — PANTOPRAZOLE SODIUM 40 MG PO TBEC
40.0000 mg | DELAYED_RELEASE_TABLET | Freq: Every day | ORAL | 0 refills | Status: AC
Start: 2023-11-29 — End: 2024-11-28

## 2023-11-29 MED ORDER — CEPHALEXIN 500 MG PO CAPS: 500.0000 mg | ORAL_CAPSULE | Freq: Two times a day (BID) | ORAL | 0 refills | Status: AC

## 2023-11-29 MED ORDER — NYSTATIN 100000 UNIT/GM EX POWD: Freq: Two times a day (BID) | CUTANEOUS | 0 refills | Status: AC

## 2023-11-29 NOTE — Discharge Summary (Signed)
 Physician Discharge Summary   Patient: Brittany [name] MRN: [id number] DOB: 25-Jan-1981  Admit date:     11/27/2023  Discharge date: 11/29/23  Discharge Physician: Justina Oman   PCP: Fredick Jarred, PA-C   Recommendations at discharge:  Repeat basic metabolic panel to follow electrolytes renal function Repeat CBC to follow hemoglobin trend and WBCs stability Continue assisting patient with weight loss management Continue close monitoring of patient's CBGs with further adjustment to hypoglycemia regimen as required. Patient to reestablish care with outpatient lymphedema clinic.  Discharge Diagnoses: Principal Problem:   Sepsis due to cellulitis [facility]) Active Problems:   Sepsis (HCC)   Rt LE Cellulitis   Bil LE Lymphedema   Morbid obesity (HCC)   Hyponatremia   Uncontrolled type 2 diabetes mellitus with hyperglycemia, with long-term current use of insulin  [facility])   Hypokalemia  Brief Hospital admission narrative: As per H&P written by Dr. Quintella Buck on 11/27/2023 Brittany [name] is a 43 y.o. female with medical history significant for  anxiety, chronic bilateral lower extremity lymphedema with history of recurrent cellulitis, DM2 with gastroparesis, GERD, morbid obesity , OSA noncompliant with CPAP and hypothyroidism presented to the ED with sudden onset of fevers above 102 at home, chills, nausea and emesis x 3 without bile or blood in the right medial thigh area redness warmth tenderness and swelling consistent with prior episodes of cellulitis superimposed on head chronic lymphedema. Symptoms started around 8 AM on 11/27/2023 - Additional history obtained from patient's husband at bedside - -No sick contacts at home, denies trauma or injury - No chest pain palpitations no dizziness no shortness of breath no urinary symptoms   -Heart rate in the 120s, tachypnea up to 25  In ED - Venous Dopplers without DVT in the either lower extremity - Chest x-ray without acute findings - UA  negative Lactic Acid 2.5 >> 2.0 WBC 13.5 Hgb 15.2 , platelets 154 - Blood cultures pending - Sodium 132, potassium 4.2, creatinine 0.9 glucose 151 bicarb 20 anion gap 12 LFTs are not elevated  Assessment and Plan: 1-sepsis secondary to right leg cellulitis - In the setting of chronic lymphedema -Excellent response to fluid resuscitation, IV antibiotics and supportive care - Sepsis features resolved at discharge. - Continue oral antibiotics, adequate hydration and outpatient follow-up with PCP and lymphedema clinic. - Ibuprofen  and cold compresses recommended to assist with pain/inflammation and discomfort into patient's affected area.  2-morbid obesity/obstructive sleep apnea - Noncompliant with CPAP -Body mass index is 85.08 kg/m.  -Low-calorie diet, portion control and increase physical activity discussed with patient. - Continue Ozempic.  3-uncontrolled type 2 diabetes - Recent A1c 8.6 - Resume home hypoglycemic regimen at time of discharge - Close outpatient follow-up with PCP to further adjust medication as needed. - Modified carbohydrate diet discussed with patient.  4-chronic lymphedema - Continue outpatient follow-up with lymphedema clinic.  5-anxiety - Continue treatment with buspirone .  6-right plantar wound - Local wound care and outpatient follow-up with PCP recommended - No signs of acute infection currently appreciated - Antibiotics for cellulitic process will also keep area controlled.  Consultants: None Procedures performed: See below for x-ray reports. Disposition: Home Diet recommendation: Low calorie diet, modified carbohydrates and heart healthy.  DISCHARGE MEDICATION: Allergies as of 11/29/2023       Reactions   Paxil [paroxetine Hcl] Other (See Comments)   Suicidal thoughts   Morphine  And Codeine Other (See Comments)   "it just freaks me out" Just morphine    Latex Itching, Swelling, Rash  Lipitor [atorvastatin] Rash   Sulfa Antibiotics  Nausea And Vomiting, Rash        Medication List     TAKE these medications    Acetaminophen  325 MG/10.15ML Soln Take 650 mg by mouth every 8 (eight) hours as needed for moderate pain (pain score 4-6), headache or fever.   busPIRone  5 MG tablet Commonly known as: BUSPAR  Take 5 mg by mouth 2 (two) times daily.   cephALEXin  500 MG capsule Commonly known as: KEFLEX  Take 1 capsule (500 mg total) by mouth 2 (two) times daily for 10 days.   doxycycline  100 MG tablet Commonly known as: VIBRA -TABS Take 1 tablet (100 mg total) by mouth 2 (two) times daily for 10 days.   gabapentin  600 MG tablet Commonly known as: NEURONTIN  Take 1 tablet (600 mg total) by mouth 3 (three) times daily. What changed:  when to take this reasons to take this additional instructions   ibuprofen  800 MG tablet Commonly known as: ADVIL  3 times a day for 48 hours; then every 8 hours as needed for pain.   Lantus  SoloStar 100 UNIT/ML Solostar Pen Generic drug: insulin  glargine Inject 90 Units into the skin at bedtime.   NovoLOG  FlexPen 100 UNIT/ML FlexPen Generic drug: insulin  aspart Inject 5-28 Units into the skin 3 (three) times daily with meals as needed for high blood sugar. Per sliding scale instructions   nystatin  powder Commonly known as: MYCOSTATIN /NYSTOP  Apply topically 2 (two) times daily.   ondansetron  8 MG disintegrating tablet Commonly known as: ZOFRAN -ODT Take 8 mg by mouth in the morning, at noon, and at bedtime.   pantoprazole  40 MG tablet Commonly known as: Protonix  Take 1 tablet (40 mg total) by mouth daily.   Semaglutide (1 MG/DOSE) 2 MG/1.5ML Sopn Inject 1 mg into the skin every Saturday.   SSD 1 % cream Generic drug: silver  sulfADIAZINE  Apply 1 Application topically 2 (two) times daily. Behind knee (s)               Discharge Care Instructions  (From admission, onward)           Start     Ordered   11/29/23 0000  Discharge wound care:       Comments:  Keep area clean and dry; wet-to-dry dressing changes twice a day. (Unstageable right foot plantar wound)   11/29/23 1430            Follow-up Information     Fredick Jarred, PA-C. Schedule an appointment as soon as possible for a visit in 10 day(s).   Specialty: Family Medicine Contact information: [address] [place] [address] [phone]                Discharge Exam: Brittany [name] Weights   11/27/23 0957  Weight: (!) 276.7 kg   General exam: Alert, awake, oriented x 3; afebrile and in no major distress.  Feeling ready to go home. Respiratory system: Clear to auscultation. Respiratory effort normal.  Good saturation on room air. Cardiovascular system:RRR.  Rubs or gallops. Gastrointestinal system: Abdomen is morbidly obese, nondistended, soft and nontender. No organomegaly or masses felt. Normal bowel sounds heard. Central nervous system: Moving 4 limbs spontaneously.  No focal neurological deficits. Extremities: No cyanosis or clubbing; bilateral lymphedema appreciated on exam. Skin: No petechiae; unstageable right foot plantar wound without signs of superimposed infection present at time of admission.  Right lower extremity with cellulitic process improving at discharge. Psychiatry: Judgement and insight appear normal. Mood & affect appropriate.  Condition at discharge: Stable and improved.  The results of significant diagnostics from this hospitalization (including imaging, microbiology, ancillary and laboratory) are listed below for reference.   Imaging Studies: US  Venous Img Lower Bilateral (DVT) Result Date: 11/27/2023 CLINICAL DATA:  Right lower extremity edema EXAM: BILATERAL LOWER EXTREMITY VENOUS DOPPLER ULTRASOUND TECHNIQUE: Gray-scale sonography with graded compression, as well as color Doppler and duplex ultrasound were performed to evaluate the lower extremity deep venous systems from the level of the common femoral vein and including the common femoral,  femoral, profunda femoral, popliteal and calf veins including the posterior tibial, peroneal and gastrocnemius veins when visible. The superficial great saphenous vein was also interrogated. Spectral Doppler was utilized to evaluate flow at rest and with distal augmentation maneuvers in the common femoral, femoral and popliteal veins. COMPARISON:  None Available. FINDINGS: RIGHT LOWER EXTREMITY Common Femoral Vein: No evidence of thrombus. Normal compressibility, respiratory phasicity and response to augmentation. Saphenofemoral Junction: No evidence of thrombus. Normal compressibility and flow on color Doppler imaging. Profunda Femoral Vein: No evidence of thrombus. Normal compressibility and flow on color Doppler imaging. Femoral Vein: No evidence of thrombus. Normal compressibility, respiratory phasicity and response to augmentation. Popliteal Vein: No evidence of thrombus. Normal compressibility, respiratory phasicity and response to augmentation. Calf Veins: No evidence of thrombus. Normal compressibility and flow on color Doppler imaging. Superficial Great Saphenous Vein: No evidence of thrombus. Normal compressibility. Venous Reflux:  None. Other Findings:  None. LEFT LOWER EXTREMITY Common Femoral Vein: No evidence of thrombus. Normal compressibility, respiratory phasicity and response to augmentation. Saphenofemoral Junction: No evidence of thrombus. Normal compressibility and flow on color Doppler imaging. Profunda Femoral Vein: No evidence of thrombus. Normal compressibility and flow on color Doppler imaging. Femoral Vein: No evidence of thrombus. Normal compressibility, respiratory phasicity and response to augmentation. Popliteal Vein: No evidence of thrombus. Normal compressibility, respiratory phasicity and response to augmentation. Calf Veins: No evidence of thrombus. Normal compressibility and flow on color Doppler imaging. Superficial Great Saphenous Vein: No evidence of thrombus. Normal  compressibility. Venous Reflux:  None. Other Findings:  None. IMPRESSION: No evidence of deep venous thrombosis in either lower extremity. Electronically Signed   By: Fernando Hoyer M.D.   On: 11/27/2023 15:45   DG Chest Port 1 View Result Date: 11/27/2023 CLINICAL DATA:  Fever, chills, nausea, right leg pain and redness. EXAM: PORTABLE CHEST 1 VIEW COMPARISON:  06/09/2023 FINDINGS: Normal sized heart. Clear lungs with normal vascularity. Unremarkable bones. IMPRESSION: Negative. Electronically Signed   By: Catherin Closs M.D.   On: 11/27/2023 12:26    Microbiology: Results for orders placed or performed during the hospital encounter of 11/27/23  Culture, blood (Routine x 2)     Status: None (Preliminary result)   Collection Time: 11/27/23 10:29 AM   Specimen: BLOOD  Result Value Ref Range Status   Specimen Description BLOOD BLOOD LEFT ARM  Final   Special Requests   Final    Blood Culture adequate volume BOTTLES DRAWN AEROBIC AND ANAEROBIC   Culture   Final    NO GROWTH 2 DAYS Performed at [facility], [address], [place], [place] [address]    Report Status PENDING  Incomplete  Culture, blood (Routine x 2)     Status: None (Preliminary result)   Collection Time: 11/27/23 10:32 AM   Specimen: BLOOD  Result Value Ref Range Status   Specimen Description BLOOD BLOOD LEFT HAND  Final   Special Requests   Final    Blood Culture  results may not be optimal due to an inadequate volume of blood received in culture bottles BOTTLES DRAWN AEROBIC ONLY   Culture   Final    NO GROWTH 2 DAYS Performed at [facility], [address], [place], [place] [address]    Report Status PENDING  Incomplete    Labs: CBC: Recent Labs  Lab 11/27/23 1029 11/28/23 0523  WBC 13.5* 10.2  NEUTROABS 12.3*  --   HGB 15.2* 12.6  HCT 48.8* 39.7  MCV 91.2 89.8  PLT 154 149*   Basic Metabolic Panel: Recent Labs  Lab 11/27/23 1029 11/28/23 0523  NA 132* 133*  K 4.2 3.3*  CL 100 106  CO2 20* 22   GLUCOSE 151* 156*  BUN 14 17  CREATININE 0.94 1.00  CALCIUM 9.2 8.0*   Liver Function Tests: Recent Labs  Lab 11/27/23 1029  AST 32  ALT 35  ALKPHOS 83  BILITOT 1.1  PROT 8.4*  ALBUMIN 3.8   CBG: Recent Labs  Lab 11/28/23 1103 11/28/23 1617 11/28/23 2054 11/29/23 0711 11/29/23 1131  GLUCAP 163* 168* 134* 117* 103*    Discharge time spent: greater than 30 minutes.  Signed: Justina Oman, MD [facility] 11/29/2023

## 2023-11-29 NOTE — Plan of Care (Signed)
   Problem: Education: Goal: Knowledge of General Education information will improve Description: Including pain rating scale, medication(s)/side effects and non-pharmacologic comfort measures Outcome: Progressing   Problem: Health Behavior/Discharge Planning: Goal: Ability to manage health-related needs will improve Outcome: Progressing   Problem: Clinical Measurements: Goal: Diagnostic test results will improve Outcome: Progressing

## 2023-11-29 NOTE — Progress Notes (Signed)
 OT Cancellation Note  Patient Details Name: Brittany [name] MRN: [id number] DOB: [birth year]   Cancelled Treatment:    Reason Eval/Treat Not Completed: OT screened, no needs identified, will sign off. Pt reports being at baseline for functional mobility and daily care. Pt is assisted by husband at baseline and is able to ambulate in the room. Pt reports not needing any therapy services at this time. Pt will be removed from the OT list.   Thurnell Floss OT, MOT   Thurnell Floss 11/29/2023, 9:49 AM

## 2023-12-02 LAB — CULTURE, BLOOD (ROUTINE X 2)
Culture: NO GROWTH
Culture: NO GROWTH
Special Requests: ADEQUATE

## 2024-03-13 ENCOUNTER — Ambulatory Visit (HOSPITAL_COMMUNITY): Admitting: Occupational Therapy

## 2024-03-22 ENCOUNTER — Encounter (HOSPITAL_COMMUNITY): Payer: Self-pay | Admitting: Occupational Therapy

## 2024-03-22 ENCOUNTER — Ambulatory Visit (HOSPITAL_COMMUNITY): Attending: Family Medicine | Admitting: Occupational Therapy

## 2024-03-22 DIAGNOSIS — R29898 Other symptoms and signs involving the musculoskeletal system: Secondary | ICD-10-CM | POA: Diagnosis present

## 2024-03-22 DIAGNOSIS — M6281 Muscle weakness (generalized): Secondary | ICD-10-CM | POA: Diagnosis present

## 2024-03-22 DIAGNOSIS — G629 Polyneuropathy, unspecified: Secondary | ICD-10-CM | POA: Diagnosis present

## 2024-03-22 NOTE — Therapy (Signed)
 [facility] Outpatient Rehabilitation at [facility] [address] [place], [place], [address] Phone: [phone]   Fax:  [phone]  Occupational Therapy Evaluation  Patient Details  Name: Brittany [name] MRN: [id number] Date of Birth: [birth year] No data recorded  Encounter Date: 03/22/2024   OT End of Session - 03/22/24 1551     Visit Number 1    Number of Visits 1    Date for OT Re-Evaluation 03/22/24    Authorization Type [employer]    OT Start Time 1447    OT Stop Time 1531    OT Time Calculation (min) 44 min    Activity Tolerance Patient tolerated treatment well    Behavior During Therapy [facility] for tasks assessed/performed          Past Medical History:  Diagnosis Date   Anxiety    Carpal tunnel syndrome on left 10/12/2016   Cellulitis and abscess of leg    Chronic abdominal pain    Depression    Diabetes mellitus without complication (HCC)    Gallstones    Gastroparesis    GERD (gastroesophageal reflux disease) 08/13/2017   Headache    Hypothyroid 01/03/2018   Lymphedema    Sleep apnea    DX with sleep apnea - unable to use c-pap   Umbilical hernia     Past Surgical History:  Procedure Laterality Date   BIOPSY  11/11/2019   Procedure: BIOPSY;  Surgeon: Eda Iha, MD;  Location: [facility];  Service: Gastroenterology;;   CESAREAN SECTION  11/09/01   ESOPHAGOGASTRODUODENOSCOPY (EGD) WITH PROPOFOL  N/A 11/11/2019   Procedure: ESOPHAGOGASTRODUODENOSCOPY (EGD) WITH PROPOFOL ;  Surgeon: Eda Iha, MD;  Location: [facility];  Service: Gastroenterology;  Laterality: N/A;   HERNIA REPAIR     INSERTION OF MESH N/A 01/02/2013   Procedure: INSERTION OF MESH;  Surgeon: Redell Faith, DO;  Location: [facility];  Service: General;  Laterality: N/A;   UMBILICAL HERNIA REPAIR N/A 01/02/2013   Procedure: LAPAROSCOPIC UMBILICAL HERNIA;  Surgeon: Redell Faith, DO;  Location: [facility];  Service: General;  Laterality: N/A;    Visit Diagnosis: Muscle weakness  (generalized)  Neuropathy  Other symptoms and signs involving the musculoskeletal system    Problem List Patient Active Problem List   Diagnosis Date Noted   Rt LE Cellulitis 11/27/2023   Hypoalbuminemia due to protein-calorie malnutrition (HCC) 06/10/2023   Leg pain 02/14/2023   Hypokalemia 01/17/2021   Hypomagnesemia 01/17/2021   Elevated troponin 01/17/2021   Bil LE Lymphedema    Class 3 obesity    Gastroparesis    Gastritis and gastroduodenitis    Sepsis due to cellulitis (HCC) 03/16/2018   Hypothyroid 01/03/2018   GERD (gastroesophageal reflux disease) 08/13/2017   Uncontrolled type 2 diabetes mellitus with hyperglycemia, with long-term current use of insulin  (HCC) 08/13/2017   Sepsis (HCC) 08/11/2017   Chronic abdominal pain    Pelvic lymphadenopathy    Splenomegaly    Nausea and vomiting    Encounter for IUD removal 04/20/2017   Pressure injury of skin 10/24/2016   Cellulitis and abscess of leg 10/23/2016   Sleep apnea 10/23/2016   Diabetes mellitus without complication (HCC) 10/23/2016   Morbid obesity with BMI of 70 and over, adult (HCC) 10/23/2016   Carpal tunnel syndrome on left 10/12/2016   Yeast dermatitis 12/31/2014   Morbid obesity (HCC) 12/29/2014   Hyponatremia 12/29/2014   Cellulitis of left lower extremity 12/29/2014   Pt seen and evaluated for an Passenger transport manager. Form is filled  out and attached to patients file, as well as faxed to [employer] and [employer]. All questions answered and needs met.   Valentin Nightingale, OTR/L [facility] [phone], [place] 03/22/2024, 3:52 PM  [facility] Outpatient Rehabilitation at [facility] [address] [place], [place], [address] Phone: [phone]   Fax:  [phone]  Name: Brittany [name] MRN: [id number] Date of Birth: [birth year]

## 2024-05-01 ENCOUNTER — Other Ambulatory Visit: Payer: Self-pay | Admitting: Medical Genetics

## 2024-05-01 DIAGNOSIS — Z006 Encounter for examination for normal comparison and control in clinical research program: Secondary | ICD-10-CM

## 2024-05-28 LAB — GENECONNECT MOLECULAR SCREEN: Genetic Analysis Overall Interpretation: NEGATIVE

## 2024-05-30 ENCOUNTER — Other Ambulatory Visit: Payer: Self-pay

## 2024-05-30 ENCOUNTER — Encounter (HOSPITAL_COMMUNITY): Payer: Self-pay | Admitting: Emergency Medicine

## 2024-05-30 ENCOUNTER — Emergency Department (HOSPITAL_COMMUNITY)
Admission: EM | Admit: 2024-05-30 | Discharge: 2024-05-30 | Disposition: A | Discharge status: Home / Self Care | Attending: Emergency Medicine | Admitting: Emergency Medicine

## 2024-05-30 DIAGNOSIS — Z91040 Latex allergy status: Secondary | ICD-10-CM | POA: Diagnosis not present

## 2024-05-30 DIAGNOSIS — R198 Other specified symptoms and signs involving the digestive system and abdomen: Secondary | ICD-10-CM | POA: Diagnosis present

## 2024-05-30 DIAGNOSIS — L03311 Cellulitis of abdominal wall: Secondary | ICD-10-CM

## 2024-05-30 DIAGNOSIS — R109 Unspecified abdominal pain: Secondary | ICD-10-CM | POA: Insufficient documentation

## 2024-05-30 DIAGNOSIS — R509 Fever, unspecified: Secondary | ICD-10-CM | POA: Insufficient documentation

## 2024-05-30 DIAGNOSIS — R7309 Other abnormal glucose: Secondary | ICD-10-CM | POA: Insufficient documentation

## 2024-05-30 LAB — COMPREHENSIVE METABOLIC PANEL WITH GFR
ALT: 24 U/L (ref 0–44)
AST: 31 U/L (ref 15–41)
Albumin: 3.8 g/dL (ref 3.5–5.0)
Alkaline Phosphatase: 85 U/L (ref 38–126)
Anion gap: 10 (ref 5–15)
BUN: 11 mg/dL (ref 6–20)
CO2: 26 mmol/L (ref 22–32)
Calcium: 9 mg/dL (ref 8.9–10.3)
Chloride: 102 mmol/L (ref 98–111)
Creatinine, Ser: 0.76 mg/dL (ref 0.44–1.00)
GFR, Estimated: >60 mL/min (ref >60)
Glucose, Bld: 166 mg/dL — ABNORMAL HIGH (ref 70–99)
Potassium: 4.7 mmol/L (ref 3.5–5.1)
Sodium: 137 mmol/L (ref 135–145)
Total Bilirubin: 0.5 mg/dL (ref 0.0–1.2)
Total Protein: 8 g/dL (ref 6.5–8.1)

## 2024-05-30 LAB — CBC WITH DIFFERENTIAL/PLATELET
Abs Immature Granulocytes: 0.03 K/uL (ref 0.00–0.07)
Basophils Absolute: 0 K/uL (ref 0.0–0.1)
Basophils Relative: 0 %
Eosinophils Absolute: 0.2 K/uL (ref 0.0–0.5)
Eosinophils Relative: 3 %
HCT: 41 % (ref 36.0–46.0)
Hemoglobin: 13 g/dL (ref 12.0–15.0)
Immature Granulocytes: 1 %
Lymphocytes Relative: 23 %
Lymphs Abs: 1.4 K/uL (ref 0.7–4.0)
MCH: 28.6 pg (ref 26.0–34.0)
MCHC: 31.7 g/dL (ref 30.0–36.0)
MCV: 90.3 fL (ref 80.0–100.0)
Monocytes Absolute: 0.3 K/uL (ref 0.1–1.0)
Monocytes Relative: 5 %
Neutro Abs: 4 K/uL (ref 1.7–7.7)
Neutrophils Relative %: 68 %
Platelets: 184 K/uL (ref 150–400)
RBC: 4.54 MIL/uL (ref 3.87–5.11)
RDW: 14.3 % (ref 11.5–15.5)
WBC: 5.9 K/uL (ref 4.0–10.5)
nRBC: 0 % (ref 0.0–0.2)

## 2024-05-30 MED ORDER — DOXYCYCLINE HYCLATE 100 MG PO CAPS: 100.0000 mg | ORAL_CAPSULE | Freq: Two times a day (BID) | ORAL | 0 refills | Status: AC

## 2024-05-30 MED ORDER — DOXYCYCLINE HYCLATE 100 MG PO TABS
100.0000 mg | ORAL_TABLET | Freq: Once | ORAL | Status: AC
  Administered 2024-05-30: 100 mg via ORAL
  Filled 2024-05-30: qty 1

## 2024-05-30 NOTE — ED Triage Notes (Signed)
 Pt states bellybutton started bleeding yesterday. Pt states around belly button is sore and the smell is really bad. Pt states she had hernia surgery 11 years ago and had mesh put in and concerned that mesh is coming through. Denies any injury

## 2024-05-30 NOTE — ED Provider Notes (Signed)
 [place] EMERGENCY DEPARTMENT AT [facility] Provider Note   CSN: [id number] Arrival date & time: 05/30/24  1801     Patient presents with: No chief complaint on file.   Brittany [name] is a 43 y.o. female.  Patient history of morbid obesity, diabetes, hyponatremia, chronic abdominal pain.  Presents the ER today complaining of drainage from her umbilicus that started yesterday, states yesterday it was bloody drainage and today it extremely and had a foul smell.  States this sore to touch, she has fever chills or abdominal pain otherwise.  She reports she had multiple hernias repaired many years ago and had hernia mesh placed in her abdomen.  HPI     Prior to Admission medications   Medication Sig Start Date End Date Taking? Authorizing Provider  Acetaminophen  325 MG/10.15ML SOLN Take 650 mg by mouth every 8 (eight) hours as needed for moderate pain (pain score 4-6), headache or fever.    [provider]  busPIRone  (BUSPAR ) 5 MG tablet Take 5 mg by mouth 2 (two) times daily. 06/06/23   [provider]  gabapentin  (NEURONTIN ) 600 MG tablet Take 1 tablet (600 mg total) by mouth 3 (three) times daily. Patient taking differently: Take 600 mg by mouth daily as needed. For pain 02/16/23   Pearlean Manus, MD  ibuprofen  (ADVIL ) 800 MG tablet 3 times a day for 48 hours; then every 8 hours as needed for pain. 11/29/23   Ricky Fines, MD  LANTUS  SOLOSTAR 100 UNIT/ML Solostar Pen Inject 90 Units into the skin at bedtime. 10/29/23   [provider]  NOVOLOG  FLEXPEN 100 UNIT/ML FlexPen Inject 5-28 Units into the skin 3 (three) times daily with meals as needed for high blood sugar. Per sliding scale instructions 06/06/23   [provider]  nystatin  (MYCOSTATIN /NYSTOP ) powder Apply topically 2 (two) times daily. 11/29/23   Ricky Fines, MD  ondansetron  (ZOFRAN -ODT) 8 MG disintegrating tablet Take 8 mg by mouth in the morning, at noon, and at bedtime.     [provider]  pantoprazole  (PROTONIX ) 40 MG tablet Take 1 tablet (40 mg total) by mouth daily. 11/29/23 11/28/24  Ricky Fines, MD  Semaglutide, 1 MG/DOSE, 2 MG/1.5ML SOPN Inject 1 mg into the skin every Saturday. 11/08/23   [provider]  SSD 1 % cream Apply 1 Application topically 2 (two) times daily. Behind knee (s) 06/06/23   [provider]    Allergies: Paxil [paroxetine hcl], Morphine  and codeine, Latex, Lipitor [atorvastatin], and Sulfa antibiotics    Review of Systems  Updated Vital Signs BP (!) 161/101 (BP Location: Left Wrist)   Pulse 79   Temp 98 F (36.7 C) (Oral)   Resp 17   Ht 5' 11 (1.803 m)   Wt (!) 274 kg   LMP  (LMP Unknown) Comment: hasn't had one in 3 years  SpO2 95%   BMI 84.24 kg/m   Physical Exam Vitals and nursing note reviewed.  Constitutional:      General: She is not in acute distress.    Appearance: She is well-developed. She is obese.  HENT:     Head: Normocephalic and atraumatic.     Mouth/Throat:     Mouth: Mucous membranes are moist.  Eyes:     Conjunctiva/sclera: Conjunctivae normal.  Cardiovascular:     Rate and Rhythm: Normal rate and regular rhythm.     Heart sounds: No murmur heard. Pulmonary:     Effort: Pulmonary effort is normal. No respiratory distress.  Breath sounds: Normal breath sounds.  Abdominal:     Palpations: Abdomen is soft.     Tenderness: There is no abdominal tenderness. There is no guarding or rebound.  Musculoskeletal:        General: No swelling.     Cervical back: Neck supple.  Skin:    General: Skin is warm and dry.     Capillary Refill: Capillary refill takes less than 2 seconds.     Comments: Small amount of dried bloody drainage in umbilicus.  There appears to be some white drainage at the base of the umbilicus see picture  Neurological:     General: No focal deficit present.     Mental Status: She is alert and oriented to person, place, and time.  Psychiatric:         Mood and Affect: Mood normal.     (all labs ordered are listed, but only abnormal results are displayed) Labs Reviewed - No data to display  EKG: None  Radiology: No results found.   Procedures   Medications Ordered in the ED  doxycycline  (VIBRA -TABS) tablet 100 mg (has no administration in time range)                                    Medical Decision Making Differential diagnosis includes to abscess, cellulitis, hernia, other  ED course: Patient not as worried about a possible infection however bellybutton or possibly projection of hernia mesh.  She states she had a hernia repair many years ago , states it was done laparoscopically and they made an incision in her abdomen through her umbilicus, but she had 4 hernias repaired.  She has not had any complications but states that for the past couple days started having bloody drainage and today there seem to be some white material coming out that looked shiny and she is worried it could be that the mesh coming out.  She also noted today that she had some foul-smelling drainage.  She has some mild tenderness when she uses a Q-tip to clean the area.  No abdominal pain.  Exam somewhat limited to body habitus as patient has BMI of 84, unfortunately this does limit our ability to get imaging.  Labs today are normal, no significant swelling, I do not see any material coming out but appears to be hernia mesh.  I discussed with patient that I think we can discharge her with antibiotics for possible infection and have her follow-up with surgery.  She is given strict return precautions.  We discussed if she has imaging she will need to go to bariatric center and she understands.  Amount and/or Complexity of Data Reviewed External Data Reviewed: notes. Labs: ordered.    Details: BC with no leukocytosis, CMP with mildly elevated blood sugar at 166 otherwise normal  Risk Prescription drug management.        Final diagnoses:  None     ED Discharge Orders     None          Brittany [name] 05/30/24 2209    Brittany Lot, MD 05/30/24 2318

## 2024-05-30 NOTE — Discharge Instructions (Signed)
 Seen today for an infection of your bellybutton.  Fortunately her vital signs are normal and your labs are normal.  We are to start you on antibiotics and have you follow-up with surgeon.  Come back to the ER if you have fever chills or severe pain.

## 2024-05-30 NOTE — ED Notes (Signed)
 See triage notes. Nad. Dried blood around navel noted.

## 2024-05-31 ENCOUNTER — Telehealth: Payer: Self-pay | Admitting: *Deleted

## 2024-05-31 NOTE — Telephone Encounter (Signed)
 Received call from patient [phone]~ telephone  Patient was seen at ER on 05/30/2024 for infection/ hernia mesh failure at bellybutton. Patient states that she was advised to follow up with general surgery for further evaluation.   Advised that while we can see her, if any surgery is needed she will need to be seen at bariatric clinic due to BMI >80.  Patient verbalized understanding. States that she will contact [facility].
